# Patient Record
Sex: Female | Born: 1955 | Race: White | Hispanic: No | Smoking: Never smoker
Health system: Southern US, Community
[De-identification: ages and names within clinical notes are randomized; demographics above are authoritative.]

## PROBLEM LIST (undated history)

## (undated) DIAGNOSIS — L309 Dermatitis, unspecified: Secondary | ICD-10-CM

## (undated) DIAGNOSIS — N3281 Overactive bladder: Secondary | ICD-10-CM

## (undated) DIAGNOSIS — Z85850 Personal history of malignant neoplasm of thyroid: Secondary | ICD-10-CM

## (undated) DIAGNOSIS — M069 Rheumatoid arthritis, unspecified: Secondary | ICD-10-CM

## (undated) DIAGNOSIS — M189 Osteoarthritis of first carpometacarpal joint, unspecified: Secondary | ICD-10-CM

## (undated) DIAGNOSIS — T7840XA Allergy, unspecified, initial encounter: Secondary | ICD-10-CM

## (undated) DIAGNOSIS — G43909 Migraine, unspecified, not intractable, without status migrainosus: Secondary | ICD-10-CM

## (undated) DIAGNOSIS — F329 Major depressive disorder, single episode, unspecified: Secondary | ICD-10-CM

## (undated) DIAGNOSIS — E039 Hypothyroidism, unspecified: Secondary | ICD-10-CM

## (undated) DIAGNOSIS — T4145XA Adverse effect of unspecified anesthetic, initial encounter: Secondary | ICD-10-CM

## (undated) DIAGNOSIS — F32A Depression, unspecified: Secondary | ICD-10-CM

## (undated) DIAGNOSIS — G8929 Other chronic pain: Secondary | ICD-10-CM

## (undated) DIAGNOSIS — G4730 Sleep apnea, unspecified: Secondary | ICD-10-CM

## (undated) DIAGNOSIS — T8859XA Other complications of anesthesia, initial encounter: Secondary | ICD-10-CM

## (undated) DIAGNOSIS — M545 Low back pain, unspecified: Secondary | ICD-10-CM

## (undated) DIAGNOSIS — M1990 Unspecified osteoarthritis, unspecified site: Secondary | ICD-10-CM

## (undated) DIAGNOSIS — K219 Gastro-esophageal reflux disease without esophagitis: Secondary | ICD-10-CM

## (undated) DIAGNOSIS — K5190 Ulcerative colitis, unspecified, without complications: Secondary | ICD-10-CM

## (undated) HISTORY — PX: DILATION AND CURETTAGE OF UTERUS: SHX78

## (undated) HISTORY — DX: Ulcerative colitis, unspecified, without complications: K51.90

## (undated) HISTORY — DX: Rheumatoid arthritis, unspecified: M06.9

## (undated) HISTORY — PX: THYROIDECTOMY, PARTIAL: SHX18

## (undated) HISTORY — PX: BUNIONECTOMY: SHX129

## (undated) HISTORY — PX: OTHER SURGICAL HISTORY: SHX169

## (undated) HISTORY — DX: Allergy, unspecified, initial encounter: T78.40XA

---
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
## 1999-04-22 ENCOUNTER — Encounter: Payer: Self-pay | Admitting: *Deleted

## 1999-04-22 ENCOUNTER — Encounter: Admission: RE | Admit: 1999-04-22 | Discharge: 1999-04-22 | Payer: Self-pay | Admitting: *Deleted

## 2000-09-22 ENCOUNTER — Encounter: Payer: Self-pay | Admitting: Allergy and Immunology

## 2000-09-22 ENCOUNTER — Encounter: Admission: RE | Admit: 2000-09-22 | Discharge: 2000-09-22 | Payer: Self-pay | Admitting: *Deleted

## 2000-10-09 ENCOUNTER — Other Ambulatory Visit: Admission: RE | Admit: 2000-10-09 | Discharge: 2000-10-09 | Payer: Self-pay | Admitting: *Deleted

## 2000-11-29 ENCOUNTER — Ambulatory Visit (HOSPITAL_COMMUNITY): Admission: RE | Admit: 2000-11-29 | Discharge: 2000-12-02 | Payer: Self-pay | Admitting: *Deleted

## 2000-11-29 ENCOUNTER — Encounter (INDEPENDENT_AMBULATORY_CARE_PROVIDER_SITE_OTHER): Payer: Self-pay | Admitting: *Deleted

## 2000-11-29 HISTORY — PX: THYROIDECTOMY, PARTIAL: SHX18

## 2001-05-02 ENCOUNTER — Encounter: Payer: Self-pay | Admitting: Endocrinology

## 2001-05-02 ENCOUNTER — Ambulatory Visit (HOSPITAL_COMMUNITY): Admission: RE | Admit: 2001-05-02 | Discharge: 2001-05-02 | Payer: Self-pay | Admitting: Endocrinology

## 2001-07-13 ENCOUNTER — Ambulatory Visit (HOSPITAL_BASED_OUTPATIENT_CLINIC_OR_DEPARTMENT_OTHER): Admission: RE | Admit: 2001-07-13 | Discharge: 2001-07-13 | Payer: Self-pay | Admitting: Orthopedic Surgery

## 2001-07-13 HISTORY — PX: CARPAL TUNNEL RELEASE: SHX101

## 2001-08-10 ENCOUNTER — Ambulatory Visit (HOSPITAL_BASED_OUTPATIENT_CLINIC_OR_DEPARTMENT_OTHER): Admission: RE | Admit: 2001-08-10 | Discharge: 2001-08-10 | Payer: Self-pay | Admitting: Orthopedic Surgery

## 2001-08-10 HISTORY — PX: CARPAL TUNNEL RELEASE: SHX101

## 2001-12-18 ENCOUNTER — Ambulatory Visit (HOSPITAL_COMMUNITY): Admission: RE | Admit: 2001-12-18 | Discharge: 2001-12-18 | Payer: Self-pay | Admitting: Endocrinology

## 2001-12-21 ENCOUNTER — Encounter: Payer: Self-pay | Admitting: Endocrinology

## 2001-12-21 ENCOUNTER — Ambulatory Visit (HOSPITAL_COMMUNITY): Admission: RE | Admit: 2001-12-21 | Discharge: 2001-12-21 | Payer: Self-pay | Admitting: Endocrinology

## 2002-01-26 ENCOUNTER — Ambulatory Visit (HOSPITAL_COMMUNITY): Admission: RE | Admit: 2002-01-26 | Discharge: 2002-01-26 | Payer: Self-pay | Admitting: *Deleted

## 2002-01-31 ENCOUNTER — Encounter: Admission: RE | Admit: 2002-01-31 | Discharge: 2002-01-31 | Payer: Self-pay | Admitting: *Deleted

## 2002-02-06 ENCOUNTER — Encounter (INDEPENDENT_AMBULATORY_CARE_PROVIDER_SITE_OTHER): Payer: Self-pay | Admitting: *Deleted

## 2002-02-06 ENCOUNTER — Ambulatory Visit (HOSPITAL_COMMUNITY): Admission: RE | Admit: 2002-02-06 | Discharge: 2002-02-06 | Payer: Self-pay | Admitting: Gastroenterology

## 2002-12-04 ENCOUNTER — Other Ambulatory Visit: Admission: RE | Admit: 2002-12-04 | Discharge: 2002-12-04 | Payer: Self-pay | Admitting: Obstetrics and Gynecology

## 2003-12-30 ENCOUNTER — Other Ambulatory Visit: Admission: RE | Admit: 2003-12-30 | Discharge: 2003-12-30 | Payer: Self-pay | Admitting: Obstetrics and Gynecology

## 2004-01-09 ENCOUNTER — Encounter: Admission: RE | Admit: 2004-01-09 | Discharge: 2004-01-09 | Payer: Self-pay | Admitting: Obstetrics and Gynecology

## 2004-02-11 ENCOUNTER — Ambulatory Visit (HOSPITAL_COMMUNITY): Admission: RE | Admit: 2004-02-11 | Discharge: 2004-02-11 | Payer: Self-pay | Admitting: Obstetrics and Gynecology

## 2004-03-01 ENCOUNTER — Encounter (INDEPENDENT_AMBULATORY_CARE_PROVIDER_SITE_OTHER): Payer: Self-pay | Admitting: Specialist

## 2004-03-01 ENCOUNTER — Ambulatory Visit (HOSPITAL_COMMUNITY): Admission: RE | Admit: 2004-03-01 | Discharge: 2004-03-01 | Payer: Self-pay | Admitting: Obstetrics and Gynecology

## 2004-03-01 HISTORY — PX: HYSTEROSCOPY WITH D & C: SHX1775

## 2004-12-21 ENCOUNTER — Encounter: Admission: RE | Admit: 2004-12-21 | Discharge: 2004-12-21 | Payer: Self-pay | Admitting: Internal Medicine

## 2005-01-07 ENCOUNTER — Other Ambulatory Visit: Admission: RE | Admit: 2005-01-07 | Discharge: 2005-01-07 | Payer: Self-pay | Admitting: Obstetrics and Gynecology

## 2005-02-01 ENCOUNTER — Ambulatory Visit (HOSPITAL_COMMUNITY): Admission: RE | Admit: 2005-02-01 | Discharge: 2005-02-01 | Payer: Self-pay | Admitting: Obstetrics and Gynecology

## 2005-12-16 ENCOUNTER — Ambulatory Visit (HOSPITAL_BASED_OUTPATIENT_CLINIC_OR_DEPARTMENT_OTHER): Admission: RE | Admit: 2005-12-16 | Discharge: 2005-12-16 | Payer: Self-pay | Admitting: Orthopedic Surgery

## 2005-12-16 HISTORY — PX: BUNIONECTOMY WITH CHILECTOMY: SHX5598

## 2006-01-18 ENCOUNTER — Other Ambulatory Visit: Admission: RE | Admit: 2006-01-18 | Discharge: 2006-01-18 | Payer: Self-pay | Admitting: Obstetrics and Gynecology

## 2006-02-06 ENCOUNTER — Ambulatory Visit (HOSPITAL_COMMUNITY): Admission: RE | Admit: 2006-02-06 | Discharge: 2006-02-06 | Payer: Self-pay | Admitting: Obstetrics and Gynecology

## 2006-10-04 ENCOUNTER — Ambulatory Visit (HOSPITAL_COMMUNITY): Admission: RE | Admit: 2006-10-04 | Discharge: 2006-10-04 | Payer: Self-pay | Admitting: Gastroenterology

## 2006-11-03 ENCOUNTER — Encounter (INDEPENDENT_AMBULATORY_CARE_PROVIDER_SITE_OTHER): Payer: Self-pay | Admitting: General Surgery

## 2006-11-03 ENCOUNTER — Ambulatory Visit (HOSPITAL_COMMUNITY): Admission: RE | Admit: 2006-11-03 | Discharge: 2006-11-03 | Payer: Self-pay | Admitting: General Surgery

## 2006-11-03 HISTORY — PX: LAPAROSCOPIC CHOLECYSTECTOMY: SUR755

## 2006-11-23 ENCOUNTER — Encounter: Admission: RE | Admit: 2006-11-23 | Discharge: 2006-11-23 | Payer: Self-pay | Admitting: Internal Medicine

## 2007-02-12 ENCOUNTER — Encounter (HOSPITAL_COMMUNITY): Admission: RE | Admit: 2007-02-12 | Discharge: 2007-02-16 | Payer: Self-pay | Admitting: Endocrinology

## 2007-07-13 ENCOUNTER — Ambulatory Visit (HOSPITAL_COMMUNITY): Admission: RE | Admit: 2007-07-13 | Discharge: 2007-07-13 | Payer: Self-pay | Admitting: Neurology

## 2008-01-30 ENCOUNTER — Observation Stay (HOSPITAL_COMMUNITY): Admission: EM | Admit: 2008-01-30 | Discharge: 2008-01-31 | Payer: Self-pay | Admitting: Emergency Medicine

## 2008-01-30 ENCOUNTER — Ambulatory Visit: Payer: Self-pay | Admitting: Cardiology

## 2008-01-31 ENCOUNTER — Encounter (INDEPENDENT_AMBULATORY_CARE_PROVIDER_SITE_OTHER): Payer: Self-pay | Admitting: Internal Medicine

## 2008-02-11 ENCOUNTER — Ambulatory Visit: Payer: Self-pay

## 2008-02-15 ENCOUNTER — Ambulatory Visit (HOSPITAL_COMMUNITY): Admission: RE | Admit: 2008-02-15 | Discharge: 2008-02-15 | Payer: Self-pay | Admitting: Gastroenterology

## 2008-02-21 ENCOUNTER — Ambulatory Visit: Payer: Self-pay | Admitting: Cardiology

## 2008-03-13 ENCOUNTER — Ambulatory Visit (HOSPITAL_BASED_OUTPATIENT_CLINIC_OR_DEPARTMENT_OTHER): Admission: RE | Admit: 2008-03-13 | Discharge: 2008-03-13 | Payer: Self-pay | Admitting: General Surgery

## 2008-03-13 HISTORY — PX: ABDOMINAL HERNIA REPAIR: SHX539

## 2009-04-21 ENCOUNTER — Ambulatory Visit (HOSPITAL_COMMUNITY): Admission: RE | Admit: 2009-04-21 | Discharge: 2009-04-21 | Payer: Self-pay | Admitting: Rheumatology

## 2009-04-27 ENCOUNTER — Ambulatory Visit (HOSPITAL_COMMUNITY): Admission: RE | Admit: 2009-04-27 | Discharge: 2009-04-27 | Payer: Self-pay | Admitting: Rheumatology

## 2009-06-17 IMAGING — CT CT PELVIS W/ CM
2 of 5 series · 14 of 32 positions shown, 19 images · IV contrast (READICAT & 100 ML OMNI 300)
Comparison: Although no images are available, the report for an
abdomen and pelvis CT of 01/31/2002 has been reviewed.

CT ABDOMEN

CLINICAL DATA: Abdominal pain and nausea.  Intermittent and mostly
in the right lower quadrant.  History of thyroid cancer.  History
of cholecystectomy.

CT ABDOMEN AND PELVIS WITH CONTRAST
TECHNIQUE: Multidetector CT imaging of the abdomen and pelvis was
performed using the standard protocol following bolus
administration of intravenous contrast.
Contrast: 100 ml [id number].

[Series 2: routine abdomen · axial · 0.77mm/px · z∈[-502,-142]mm · 7 of 98 slices shown, 12 images]
[im 13/98  soft-tissue]
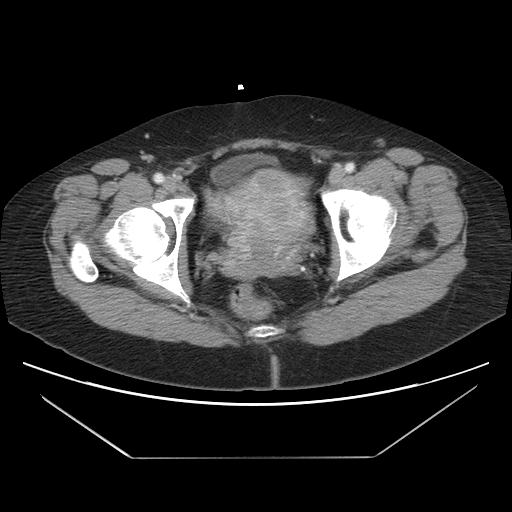
[im 13/98  bone]
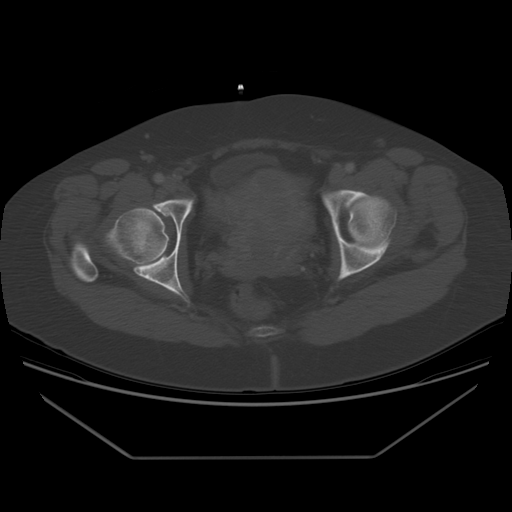
[im 25/98  soft-tissue]
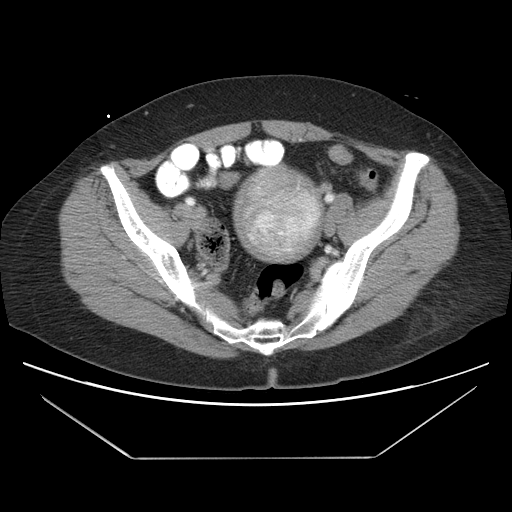
[im 37/98  soft-tissue]
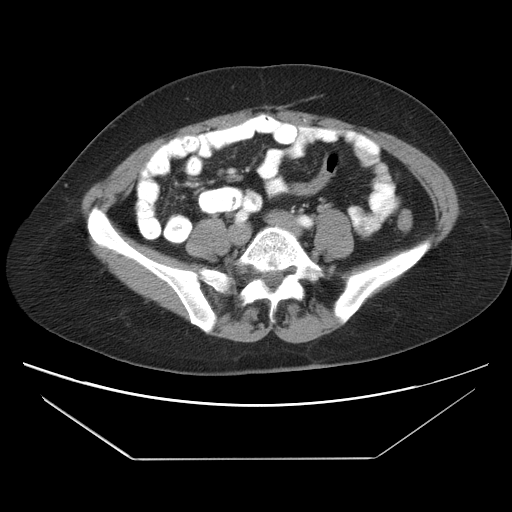
[im 49/98  soft-tissue]
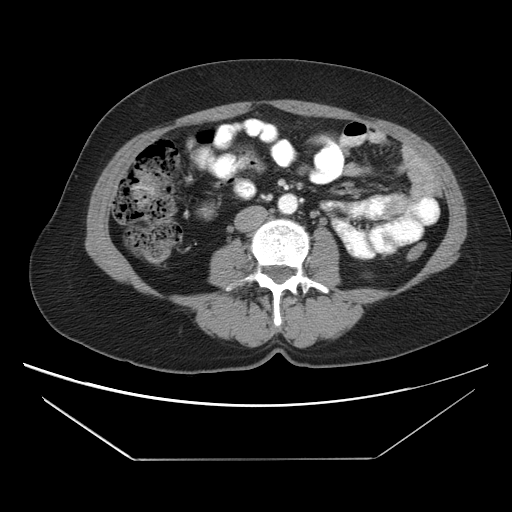
[im 49/98  lung]
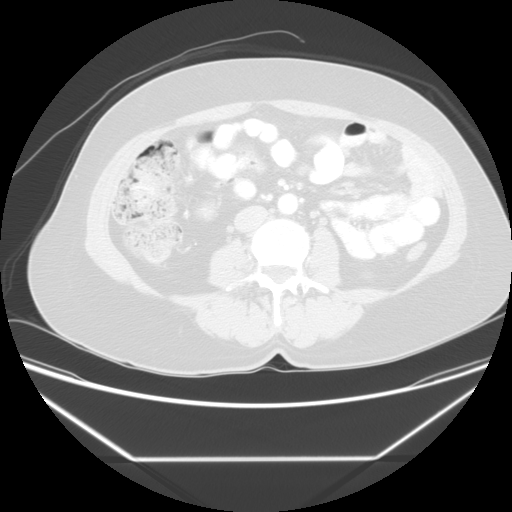
[im 61/98  soft-tissue]
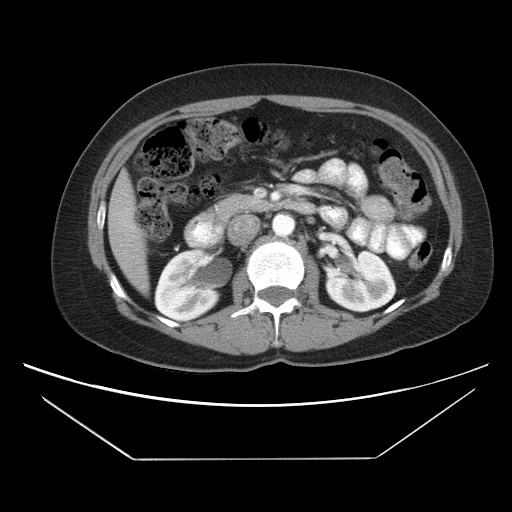
[im 61/98  lung]
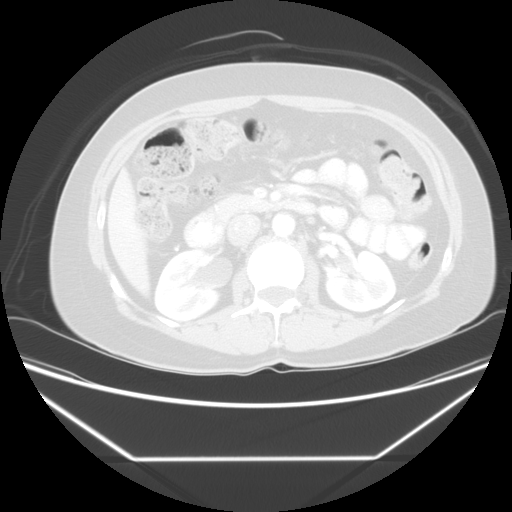
[im 73/98  soft-tissue]
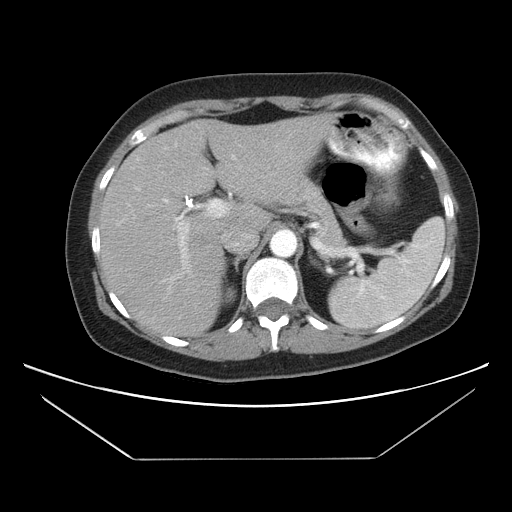
[im 73/98  lung]
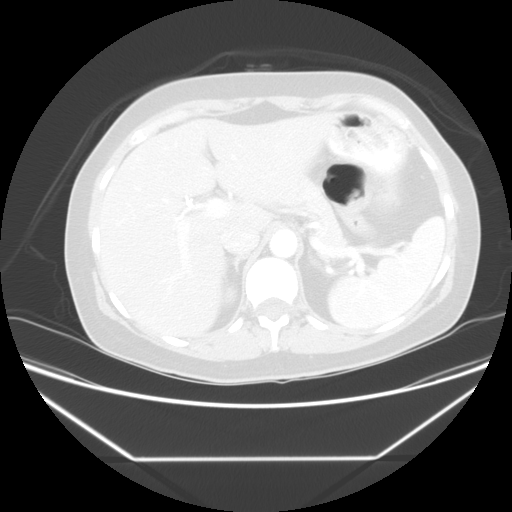
[im 85/98  soft-tissue]
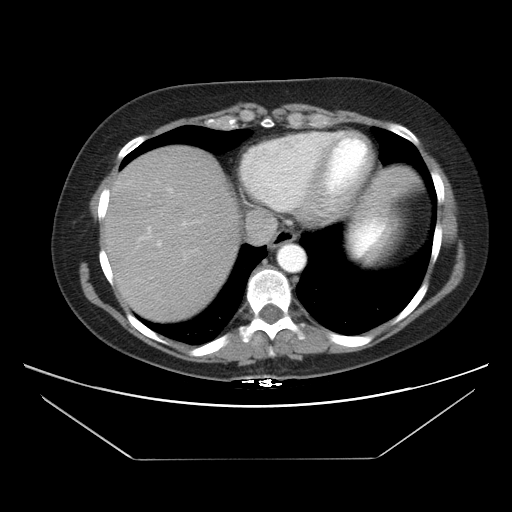
[im 85/98  lung]
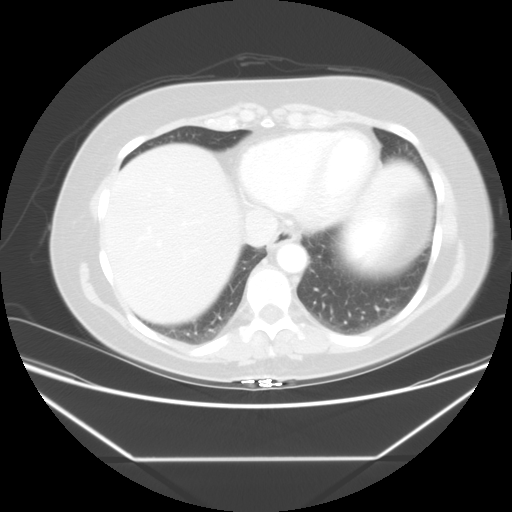

[Series 400: reformatted · sagittal · 0.98mm/px · 7 of 112 slices shown]
[im 13/112  soft-tissue]
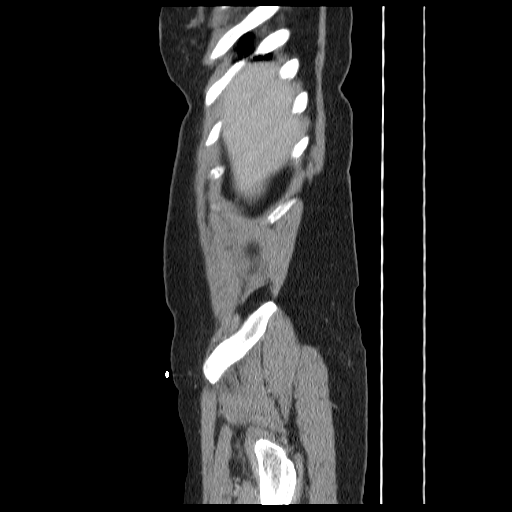
[im 25/112  soft-tissue]
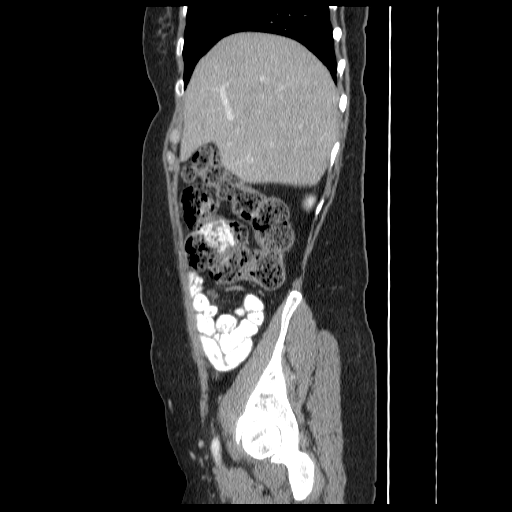
[im 38/112  soft-tissue]
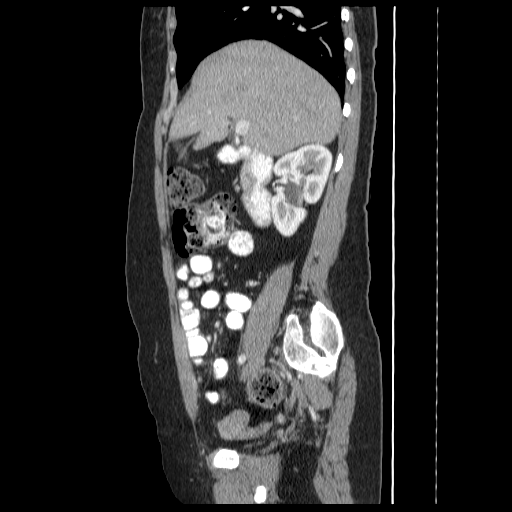
[im 50/112  soft-tissue]
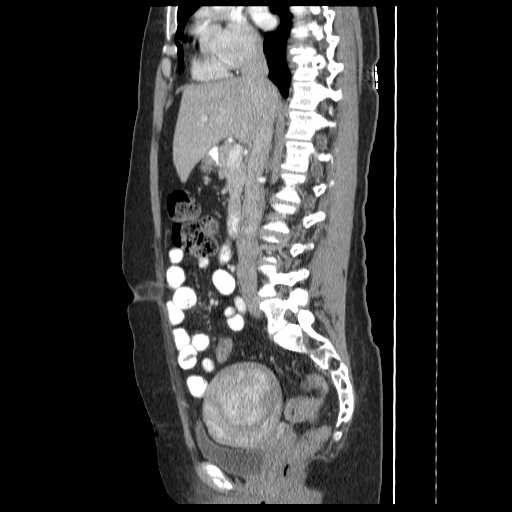
[im 62/112  soft-tissue]
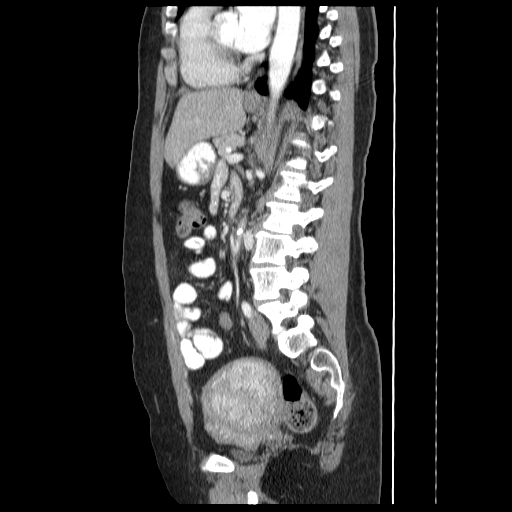
[im 75/112  soft-tissue]
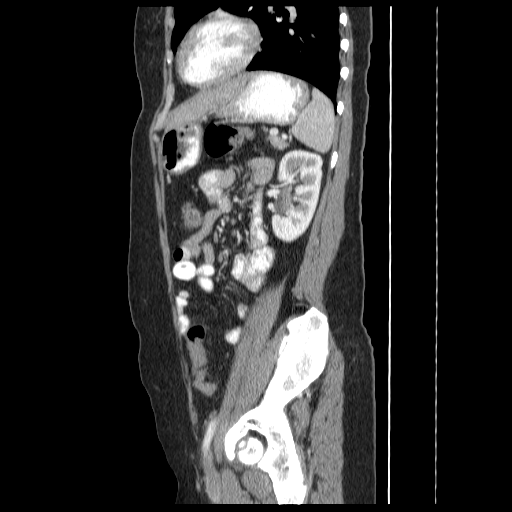
[im 87/112  soft-tissue]
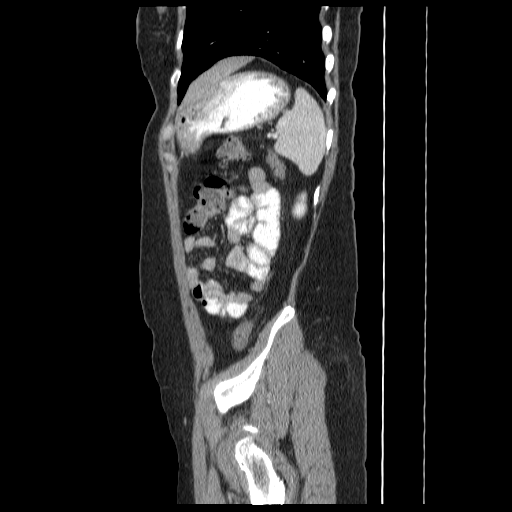

[14 of 32 positions shown; findings below may reference images not displayed]

FINDINGS: No focal abnormalities seen in the liver or spleen.  The
stomach, duodenum, pancreas, adrenal glands, and kidneys have
normal imaging features.

No intraperitoneal free fluid.  No abdominal lymphadenopathy.
There is no abdominal aortic aneurysm.  Circumaortic left renal
vein noted.  Portal vein, superior mesenteric vein, and splenic
vein are patent.  Celiac axis and superior mesenteric artery are
widely patent as is the inferior mesenteric artery.

Abdominal bowel loops have normal imaging features.  Small
umbilical hernia contains only omental fat.
IMPRESSION: No CT evidence to explain this patient's history of abdominal pain
and nausea.

CT PELVIS
FINDINGS: No pelvic lymphadenopathy.  7.4 cm posterior intramural
fibroid seen in the uterus.  There is no adnexal mass.  No
intraperitoneal free fluid.  Bladder is unremarkable.

Cecal tip is flipped up under the hepatic flexure, but the cecum
remains posterior and medial to the ascending colon. There is no
cecal volvulus. No large or small bowel wall thickening.  The
terminal ileum is normal.  The appendix is normal.

Bone windows show no worrisome lytic or sclerotic osseous lesions.
IMPRESSION: No acute findings in the anatomic pelvis.  No radiographic evidence
to explain this patient's history of abdominal pain and nausea.

## 2010-06-13 ENCOUNTER — Encounter: Payer: Self-pay | Admitting: Internal Medicine

## 2010-08-13 ENCOUNTER — Other Ambulatory Visit (HOSPITAL_COMMUNITY): Payer: Self-pay | Admitting: Internal Medicine

## 2010-08-13 DIAGNOSIS — Z1231 Encounter for screening mammogram for malignant neoplasm of breast: Secondary | ICD-10-CM

## 2010-08-24 ENCOUNTER — Ambulatory Visit (HOSPITAL_COMMUNITY): Payer: BC Managed Care – PPO

## 2010-08-26 ENCOUNTER — Ambulatory Visit (HOSPITAL_COMMUNITY)
Admission: RE | Admit: 2010-08-26 | Discharge: 2010-08-26 | Disposition: A | Discharge status: Home / Self Care | Payer: BC Managed Care – PPO | Source: Ambulatory Visit | Attending: Internal Medicine | Admitting: Internal Medicine

## 2010-08-26 DIAGNOSIS — Z1231 Encounter for screening mammogram for malignant neoplasm of breast: Secondary | ICD-10-CM

## 2010-08-31 ENCOUNTER — Other Ambulatory Visit: Payer: Self-pay | Admitting: Internal Medicine

## 2010-08-31 DIAGNOSIS — R928 Other abnormal and inconclusive findings on diagnostic imaging of breast: Secondary | ICD-10-CM

## 2010-09-08 ENCOUNTER — Other Ambulatory Visit: Payer: BC Managed Care – PPO

## 2010-09-09 ENCOUNTER — Other Ambulatory Visit: Payer: Self-pay | Admitting: Internal Medicine

## 2010-09-09 ENCOUNTER — Other Ambulatory Visit: Payer: Self-pay | Admitting: Diagnostic Radiology

## 2010-09-09 ENCOUNTER — Ambulatory Visit
Admission: RE | Admit: 2010-09-09 | Discharge: 2010-09-09 | Disposition: A | Discharge status: Home / Self Care | Payer: BC Managed Care – PPO | Source: Ambulatory Visit | Attending: Internal Medicine | Admitting: Internal Medicine

## 2010-09-09 DIAGNOSIS — R928 Other abnormal and inconclusive findings on diagnostic imaging of breast: Secondary | ICD-10-CM

## 2010-09-14 ENCOUNTER — Other Ambulatory Visit: Payer: Self-pay | Admitting: Gastroenterology

## 2010-09-14 DIAGNOSIS — R1032 Left lower quadrant pain: Secondary | ICD-10-CM

## 2010-09-16 ENCOUNTER — Ambulatory Visit
Admission: RE | Admit: 2010-09-16 | Discharge: 2010-09-16 | Disposition: A | Discharge status: Home / Self Care | Payer: BC Managed Care – PPO | Source: Ambulatory Visit | Attending: Gastroenterology | Admitting: Gastroenterology

## 2010-09-16 DIAGNOSIS — R1032 Left lower quadrant pain: Secondary | ICD-10-CM

## 2010-09-16 MED ORDER — IOHEXOL 300 MG/ML  SOLN
100.0000 mL | Freq: Once | INTRAMUSCULAR | Status: AC | PRN
  Administered 2010-09-16: 100 mL via INTRAVENOUS

## 2010-10-05 NOTE — Discharge Summary (Signed)
NAME:  Terry Shannon, Terry Shannon NO.:  [id number]   MEDICAL RECORD NO.:  [id number]          PATIENT TYPE:  OBV   LOCATION:  6533                         FACILITY:  [facility]   PHYSICIAN:  Richarda Overlie, MD       DATE OF BIRTH:  [birth year]   DATE OF ADMISSION:  01/30/2008  DATE OF DISCHARGE:  01/31/2008                               DISCHARGE SUMMARY   DISCHARGE DIAGNOSES:  1. Chest pain, ruled out for acute coronary syndrome.  2. Negative D-dimer, low suspicion for pulmonary embolism.  3. History of hypothyroidism.  4. History of ulcerative colitis.  5. History of possible rheumatoid arthritis.   SUBJECTIVE:  This is a 55 year old female who presents to the ER with a  chief complaint of stuttering chest pain, onset about 24 hours prior to  her presentation, 5/10 in intensity, not particularly related to  exertion.  It woke the patient up from her sleep.  The chest pain has  been intermittent over the last 24 hours.  The patient was found to have  ventricular trigeminy at urgent care and was referred to [facility] ER  for further evaluation.  Initially, the patient was found to be mildly  hypertensive, with systolic blood pressure 149/82.  Initial EKG showed  ventricular trigeminy.  Serial troponins were found to be negative.  Serial CK-MBs were found to be negative.  A 2D echocardiogram was done  that showed normal ejection fraction of 55%.  No gross valvular heart  disease.  No wall motion abnormalities.  The patient was found to have a  low TSH of 0.302, with a normal free T4.  Since the patient is  symptomatic with ectopy, the dose of her Synthroid is being reduced to  100 mcg p.o. daily.  The patient was also started on beta blocker 25 mg  p.o. daily, which improved her ectopy, ruled out for acute coronary  syndrome, negative D-dimer.  Cardiology consultation was obtained, and  the patient was recommended to have an outpatient Myoview exercise  stress test at the [facility] on February 06, 2008 at  12:00.   DISCHARGE MEDICATIONS:  1. Synthroid 100 mcg p.o. daily.  2. Metoprolol 25 mg p.o. twice a day.  3. Ambien 12.5 at bedtime.  4. Imitrex 100 mg p.r.n.  5. Mesalamine 1 tablet b.i.d.  6. Nexium 40 mg daily.  7. Oxybutynin 10 mg daily.  8. Zyrtec 10 mg daily.   MEDICATIONS TO BE DISCONTINUED:  Prempro.   FOLLOW-UP CONCERNS:  1. Patient to follow up with her primary care Ivyana Locey in 5-7 days.  2. Follow up for the stress test on February 06, 2008, with results      to PCP as well as cardiology.      Richarda Overlie, MD  Electronically Signed     NA/MEDQ  D:  01/31/2008  T:  01/31/2008  Job:  [id number]

## 2010-10-05 NOTE — Consult Note (Signed)
NAME:  Terry Shannon, Terry Shannon NO.:  [id number]   MEDICAL RECORD NO.:  [id number]          PATIENT TYPE:  OBV   LOCATION:  6533                         FACILITY:  [facility]   PHYSICIAN:  Madolyn Frieze. Jens Som, MD, FACCDATE OF BIRTH:  May 27, 1955   DATE OF CONSULTATION:  01/30/2008  DATE OF DISCHARGE:  01/31/2008                                 CONSULTATION   PRIMARY CARDIOLOGIST:  New to [facility] Cardiology being seen by Dr. Madolyn Frieze. Crenshaw.   PRIMARY CARE Teddy Rebstock:  Merlene Laughter. Renae Gloss, MD   PATIENT PROFILE:  A 55 year old Caucasian female without prior cardiac  history who presented with chest pain.   PROBLEMS:  1. Chest pain.  2. Ventricular trigeminy.      a.     January 31, 2008, 2-D echocardiogram EF 65%, no wall       motion abnormalities.  3. Rheumatoid arthritis.  4. Thyroid cancer.      a.     Status post thyroidectomy in 2002.  5. Ulcerative colitis.  6. Migraine headache.  7. Status post laparoscopic cholecystectomy in 2005.  8. History of abnormal uterine bleeding and endometrial polyps,      October 2005.  9. Carpal tunnel syndrome, status post release on the right, February      2003, and release on the left in March 2003.  10.Umbilical hernia pending for surgery, October 2009.   HISTORY OF PRESENT ILLNESS:  A 55 year old Caucasian female without  prior cardiac history.  She was in her usual state of health until the  night before last when she awoke with sharp focal knife-like stabbing  pain at her left lower sternal border beneath the left breast without  associated symptoms.  She sat up and pain resolved within 1-2 minutes.  She had 4-5 recurring episodes of chest pain throughout the night, each  relieved in 1-2 minutes with sitting up.  During the day of January 30, 2008, she had multiple recurrent episodes while at work, generally while  sitting or may be while standing, again sharp and knife-like lasting 5-  10 minutes and resolving  spontaneously.  She had no associated symptoms.  Because she had multiple episodes, she presented to an Urgent Care last  night and ECG was performed showing ventricular trigeminy, otherwise,  sinus rhythm without acute ST or T changes.  She was then taken to the  [facility] ED.  She was admitted by the [facility] and cardiac  markers and D-dimer have been negative.  She was placed on beta-blocker  therapy and now has occasional PVCs with resolution of trigeminy.  She  has had some intermittent pain since she has been hospitalized, although  is currently pain free.   ALLERGIES:  CODEINE and IV CONTRAST.   CURRENT MEDICATIONS:  1. Aspirin 81 mg daily.  2. Enoxaparin 40 mg daily.  3. Lidocaine patch daily.  4. Mesalamine 800 mg b.i.d.  5. Lopressor 25 mg b.i.d.  6. Protonix 40 mg daily.   FAMILY HISTORY:  Mother died of CVA at 52, father died of lung cancer  with a  history of diabetes at 72.  She has 3 brothers, 1 has borderline  diabetes, otherwise, all are alive and well.   SOCIAL HISTORY:  She lives in [place] with her partner.  She works as  a Theatre manager.  She denies tobacco or drug use.  She has 1-2  alcoholic beverages a week.  She walks on a treadmill at a rapid pace 3  times per week without limitations.   REVIEW OF SYSTEMS:  Positive for chest pain.  She has history of  umbilical hernia and has had some middle abdominal tenderness.  She has  rheumatoid arthritis with chronic foot pain.  She is premenopausal and  thus occasionally has hot flashes.  Otherwise, all systems reviewed are  negative.   PHYSICAL EXAMINATION:  VITAL SIGNS:  Temperature 98.0, heart rate 76,  respirations 20, blood pressure 150/53, pulse ox 96% on room air, and  weight is 96.7 kg.  GENERAL:  Pleasant, white female in no acute distress.  Awake, alert and  oriented x3.  HEENT:  Normal.  NEUROLOGIC:  Grossly intact.  Nonfocal.  SKIN:  Warm and dry without lesions or masses.   NECK:  No bruits or JVD.  LUNGS:  Respirations are regular and unlabored, clear to auscultation.  CARDIAC:  Regular S1 and S2.  No S3, S4, or murmurs.  ABDOMEN:  Round, soft with mild midline abdominal tenderness at the  location of her umbilical hernia.  Bowel sounds present x4.  EXTREMITIES:  Warm and dry.  No clubbing, cyanosis, or edema.  Dorsalis  pedis posterior tibial pulses 2+ bilaterally.   Chest x-ray shows no evidence of acute cardiopulmonary disease.  Echocardiogram shows an EF of 55% without regional wall motion  abnormalities.  EKG shows sinus rhythm at rate of 71 with no acute ST or  T changes.  Hemoglobin 14.3, hematocrit 41.9, WBCs 6.7, and platelets  251.  Sodium 142, potassium 4.3, chloride 106, CO2 27, BUN 12,  creatinine 0.74, and glucose 100.  Total bilirubin 0.7, alkaline  phosphatase 56, AST 24, ALT 28, total protein 6.5, and albumin 4.0.  TSH  0.279.  D-dimer 0.31.  Cardiac markers negative x3.  Urinalysis  negative.   ASSESSMENT AND PLAN:  1. Chest pain.  The patient presents with atypical sharp, shooting and      focal chest pain.  Cardiac markers are negative.  D-dimers are      normal.  She initially presented with ventricular trigeminy which      has improved with beta-blocker therapy.  We have arranged for an      outpatient exercise Myoview on February 06, 2008, at 12 noon.  We      would also set up to see Dr. Jens Som in followup on February 21, 2008, at 10:45 a.m.  We would plan no additional inpatient cardiac      evaluation at this time.  Notably echocardiogram reviewed and      normal.  2. ?Hypertension.  The patient without previous history of diagnosed      hypertension, although her pressure has been elevated while she is      here.  This morning her pressure was lower with a systolic of 115.      She is currently on Lopressor 25 mg b.i.d. which was initiated for      premature ventricular contractions and we will recommend       continuation of this.  3. Ventricular trigeminy, better  with beta-blocker therapy.      Electrolytes are within normal limits.  We will obtain a Myoview to      rule out ischemia.  4. History of hypothyroidism.  TSH is low.  The patient is on      Synthroid and this is currently being held.  The      Internal Medicine Team has ordered a free T4 to further evaluate.      The patient to follow up with Dr. Renae Gloss.  5. Umbilical hernia.  The patient is pending surgery in October 2009.      We will obtain a Myoview which hopefully will service cardiac      clearance.      Nicolasa Ducking, ANP      Madolyn Frieze. Jens Som, MD, [facility]  Electronically Signed    CB/MEDQ  D:  01/31/2008  T:  02/01/2008  Job:  [id number]

## 2010-10-05 NOTE — Op Note (Signed)
NAME:  RANDY, WHITENER NO.:  [id number]   MEDICAL RECORD NO.:  [id number]          PATIENT TYPE:  AMB   LOCATION:  DSC                          FACILITY:  [facility]   PHYSICIAN:  Cherylynn Ridges, M.D.    DATE OF BIRTH:  1955/09/17   DATE OF PROCEDURE:  03/13/2008  DATE OF DISCHARGE:                               OPERATIVE REPORT   PREOPERATIVE DIAGNOSIS:  Periumbilical incisional hernia.   POSTOPERATIVE DIAGNOSES:  1. Periumbilical incisional hernia.  2. A 2 cm hernia defect.   PROCEDURE:  Primary repair of periumbilical ventral hernia/incisional  hernia without mesh.   SURGEON:  Marta Lamas. Lindie Spruce, MD   ANESTHESIA:  General with a laryngeal airway.   ESTIMATED BLOOD LOSS:  Less than 20 mL.   COMPLICATIONS:  None.   CONDITION:  Stable.   INDICATIONS FOR PROCEDURE:  The patient is a 55 year old who is status  post laparoscopic cholecystectomy and this developed a hernia and a  periumbilical incision who now comes in for repair.   OPERATION:  The patient was taken to the operating room and placed on  table in a supine position.  After an adequate general laryngeal airway  anesthetic was administered, she was prepped and draped in a usual  sterile manner exposing the periumbilical area.   A supraumbilical curvilinear incision was made using #15 blade through  the previous site of her laparoscopic incision.  It was taken down to  the midline fascial edges of the hernia sac.  The hernia sac was  dissected away from the umbilical skin and as we encircled the sac we  cut down at the fascial edge of the sac and the fascia incising into the  peritoneal cavity.  We grabbed the edges of the fascia with Kocher  clamps taking care not to injure the bowel.  We subsequently repaired  the defect using interrupted simple and figure-of-eight stitches of #1  Novafil.  We then used a running back and forth stitch of 0 Prolene to  reinforce the interrupted repair.  No mesh was  used.  We  irrigated with saline solution.  We then closed in 2 layers.  The subcu  was closed with 3-0 Vicryl, then the skin was closed using a running  subcuticular stitch of 4-0 Monocryl.  A 0.5% Marcaine without epi was  injected into the skin.  A total of 9 mL used.  Sterile dressing was  applied including Dermabond, Steri-Strips, and Tegaderm.      Cherylynn Ridges, M.D.  Electronically Signed     JOW/MEDQ  D:  03/13/2008  T:  03/14/2008  Job:  [id number]

## 2010-10-05 NOTE — Assessment & Plan Note (Signed)
[facility]                            CARDIOLOGY OFFICE NOTE   Terry, Shannon                      MRN:          [id number]  DATE:02/21/2008                            DOB:          1956-01-25    [name] is a very pleasant 55 year old female that I recently saw in  the hospital on January 30, 2008, secondary to atypical chest pain.  She was also noted to have occasional PVCs.  She did rule out for  myocardial infarction with serial enzymes.  She also had a D-dimer that  was normal.  We schedule her to have an outpatient Myoview which was  formed on February 11, 2008.  Her perfusion was normal.  The study was  not gated due to her ectopy.  Also note, she had an echocardiogram at  that time she was in the hospital performed on January 31, 2008.  Her  LV function was normal.  There was no significant valvular abnormalities  noted.  Since that time, she has continued to have chest pain.  It is  under the left breast at times but also above the left breast at times.  It is not exertional.  It is not positional.  It typically lasts 5-10  minutes and resolves spontaneously.  It does not radiate.  There is no  associated shortness of breath, nausea, vomiting, or diaphoresis.  She  does occasionally note this worse after eating and improves somewhat  with sitting up.  She also thinks that taking a deep breath sometimes  makes it worse.   MEDICATIONS:  1. Metoprolol 25 mg p.o. b.i.d.  2. Flexeril 5 mg p.o. daily.  3. Lialda b.i.d.  4. Oxybutynin.  5. Alotec.  6. Synthroid 137 mcg p.o. daily.  7. Multivitamin.  8. Ambien.   PHYSICAL EXAMINATION:  VITAL SIGNS:  Today shows a blood pressure 115/80  and her pulse is 80.  She weighs 170 pounds.  HEENT:  Normal.  NECK:  Supple.  CHEST:  Clear.  CARDIOVASCULAR:  Regular rate and rhythm.  ABDOMEN:  No tenderness.  EXTREMITIES:  No edema.   DIAGNOSES:  1. Recent chest pain - her symptoms are  extremely atypical.  She ruled      out for myocardial infarction and her recent Myoview was normal.  I      do not think this is cardiac and we will not pursue further      evaluation.  It certainly may be gastrointestinal related.      Regardless, I have asked to follow Dr. Renae Shannon for further      evaluation.  She may need a gastrointestinal evaluation in the      future.  2. Recent premature ventricular contractions - she is not having      symptoms with this and her left ventricular function is normal.      She will continue on her Lopressor for now.  Some of this may be      related to her recent increased dose of Synthroid.  Now that it has      been  reducing, we may be able to discontinue her Lopressor down the      road as long as her blood pressure will tolerate.  I have asked her      to follow up with Dr. Renae Shannon concerning this issue as well.  3. Rheumatoid arthritis.  4. History of thyroid cancer.  5. Ulcerative colitis.  6. History of migraine headaches.  7. Hypothyroidism - she will follow up with Dr. Renae Shannon for further      management of this issue.  Apparently, her Synthroid would dose was      reduced in the hospital.   I will see her back on an as-needed basis.     Terry Shannon Terry Som, MD, [facility]  Electronically Signed    BSC/MedQ  DD: 02/21/2008  DT: 02/21/2008  Job #: [id number]   cc:   Terry Shannon. Terry Shannon, M.D.

## 2010-10-05 NOTE — Op Note (Signed)
NAME:  Terry Shannon, PAIR NO.:  [id number]   MEDICAL RECORD NO.:  [id number]          PATIENT TYPE:  AMB   LOCATION:  SDS                          FACILITY:  [facility]   PHYSICIAN:  Cherylynn Ridges, M.D.    DATE OF BIRTH:  04/26/1956   DATE OF PROCEDURE:  11/03/2006  DATE OF DISCHARGE:                               OPERATIVE REPORT   PREOPERATIVE DIAGNOSIS:  Symptomatic biliary dyskinesia.   POSTOPERATIVE DIAGNOSIS:  Symptomatic biliary dyskinesia.   PROCEDURE:  Laparoscopic cholecystectomy with cholangiogram.   SURGEON:  Cherylynn Ridges, M.D.   ASSISTANT:  Ollen Gross. Vernell Morgans, M.D.   ANESTHESIA:  General endotracheal.   SPECIMEN:  Gallbladder.   COMPLICATIONS:  None.   CONDITION:  Stable.   ESTIMATED BLOOD LOSS:  Less than 10 mL.   INDICATIONS FOR OPERATION:  The patient is a 55 year old with  symptomatic gallbladder dyskinesia who comes in now with an ejection  fraction of 11% for an elective laparoscopic cholecystectomy.   FINDINGS:  The patient had some adhesions of the duodenum to the  infundibulum of the gallbladder.  Cholangiogram was normal.   OPERATION:  The patient was taken to the operating room and placed on  the table in the supine position.  After an adequate endotracheal  anesthetic was administered, she was prepped and draped in the usual  sterile manner, exposing the midline and the right upper quadrant.   A supraumbilical curvilinear incision was made using #11 blade and taken  down to the midline fascia.  A defect was noted at the supraumbilical  margin, and we were able to bluntly dissect down through this umbilical  defect into the peritoneal cavity without having to make a cut with the  scalpel.  We grabbed the fascia with Kocher clamps and made a  pursestring suture around the fascial edge using 0 Vicryl suture.  A  Hassan cannula was passed through the fascial opening into the  peritoneal cavity and secured in place with the  pursestring.   Carbon dioxide insufflation was instilled through the Hassan cannula  into the peritoneal cavity up to a maximal pressure of 15 mmHg.  Two  right costal margin 5-mm cannulas and a subxiphoid 11/12-mm cannula were  passed under direct vision into the peritoneal cavity.  The patient was  placed in reversed Trendelenburg.  The left-side was tilted down and the  dissection begun.   Upon retracting the gallbladder towards the anterior abdominal wall and  right upper quadrant, adhesions were noted between the infundibulum and  the duodenum.  These were taken down with blunt and sharp dissection  with electrocautery, taking care to stay away from the duodenum.  We  were able to dissect out the cystic duct and the cystic artery in the  hepatoduodenal triangle and the triangle of Calot.  We isolated the  cystic duct and placed a clip along the gallbladder side.  We were able  to pass a Cook catheter through a cholecystodochotomy made with  laparoscopic scissors to perform the cholangiogram.  This showed good  flow into the duodenum, no intraductal  filling defects, no dilatation of  the common duct, and good proximal flow.   Once cholangiogram was completed, we removed the clip, securing the  catheter in place, triply clipped the distal cystic duct and transected  the cystic duct.   The cystic artery was easily identified and clipped proximally and  distally x3 and then transected.  We dissected out the gallbladder from  its bed with minimal difficulty without entrance into the gallbladder  dome itself.  We removed the gallbladder from the supraumbilical site  with minimal difficulty and then tied off the pursestring suture,  closing up the fascial opening.   We aspirated fluid and gas from around the gallbladder fossa and the  liver where there was minimal to no bleeding.  Only maybe 20 to 50 mL of  saline were used.  We aspirated all fluid and gas from above the liver  as we  removed all cannulas.   The skin sites at the subxiphoid and the supraumbilical site were closed  using running subcuticular stitch of 4-0 Vicryl after injecting 0.25%  Marcaine with epinephrine at all sites.  The lateral two trocar sites  were closed using Dermabond.  Sterile dressings were applied to all  wounds.  All needle counts, sponge counts, and instrument counts were  correct.      Cherylynn Ridges, M.D.  Electronically Signed     JOW/MEDQ  D:  11/03/2006  T:  11/03/2006  Job:  [id number]   cc:   Anselmo Rod, M.D.  Merlene Laughter. Renae Gloss, M.D.

## 2010-10-05 NOTE — H&P (Signed)
NAME:  Terry Shannon, Terry Shannon NO.:  [id number]   MEDICAL RECORD NO.:  [id number]          PATIENT TYPE:  OBV   LOCATION:  6533                         FACILITY:  [facility]   PHYSICIAN:  Vania Rea, M.D. DATE OF BIRTH:  June 13, 1955   DATE OF ADMISSION:  01/30/2008  DATE OF DISCHARGE:                              HISTORY & PHYSICAL   PRIMARY CARE PHYSICIAN:  Merlene Laughter. Renae Gloss, M.D.   CHIEF COMPLAINT:  Recurrent chest pains.   HISTORY OF PRESENT ILLNESS:  This is a 55 year old Caucasian lady with a  history of rheumatoid arthritis who was been having stuttering left-  sided chest pain for the past 24 hours.  The pain initially awoke her  from sleep last night. It was about a 5 out of 10,  and although she  awoke several times, she was able to go back to sleep without taking any  remedies.  She awoke this morning painfree, went to work but throughout  the day had episodic recurrent left-sided chest pain lasting about 5  minutes at a time.  She had no nausea, diaphoresis. No shortness of  breath.  There was no radiation of the pain.  She took an aspirin  without relief and, in fact, she can identify no precipitating,  aggravating or relieving factors.  Eventually, because of persistent  recurrence of the pain, the patient went an Urgent Care this evening  where an EKG was done which showed trigeminy and the patient was  transferred to the [facility] emergency room for further evaluation.   The patient has no prior history of cardiac evaluation.  She exercises  regularly on the treadmill and jogging about three times per week.  She  does not suffer with dyspnea on exertion, orthopnea or lower extremity  edema.  She gives no history of palpitations.   PAST MEDICAL HISTORY:  1. History of thyroid cancer, status post partial thyroidectomy over      20 years ago and total thyroidectomy in 2002.  2. History of migraine.  3. History of ulcerative colitis mesalamine.  4. She  is status post laparoscopic cholecystectomy in 2005.   MEDICATIONS:  1. Ambien 12.5 mg at bedtime.  2. Imitrex 100 mg p.r.n.  3. Mesalamine in the form of Lialda 1 tablet twice daily.  4. Nexium 40 mg daily.  5. Oxybutynin 10 mg daily.  6. Prempro 0.45/1.5 daily.  7. Pristiq 100 mg daily.  8. Synthroid 150 mg daily.  9. Zyrtec 10 mg daily.   ALLERGIES:  CODEINE and IVP CONTRAST DYES.   SOCIAL HISTORY:  She denies tobacco abuse.  She has one to two drinks of  alcohol per week.  Denies illicit drug use.  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   FAMILY HISTORY:  Diabetes, lung and colon cancers, and rheumatoid  arthritis.   REVIEW OF SYSTEMS:  The review of systems other than noted above, a 10-  point review of systems was unremarkable.   PHYSICAL EXAMINATION:  GENERAL:  Pleasant, middle-aged, Caucasian lady  sitting up in the stretcher in  no acute distress.  VITAL SIGNS:  Temperature is 97.9, pulse 67, respiration 18, blood  pressure 149/82.  She is saturating at 98% on room air.  She is having  no pain currently.  HEENT:  Her pupils are round and equal.  Mucous membranes are pink and  anicteric.  NECK:  She has bilateral enlarged lymph nodes in the upper anterior  cervical chain, about 1.5 cm bilaterally and nontender.  She has no  thyromegaly, of course.  She has large transverse suprasternal scar,  status post thyroidectomy.  CHEST:  Clear to auscultation bilaterally.  CARDIOVASCULAR:  She has irregularly irregular irregular rhythm.  ABDOMEN:  Her abdomen is soft and nontender.  There are no masses.  EXTREMITIES:  Without edema.  She has 2+ pulses bilaterally.  CENTRAL NERVOUS SYSTEM:  Cranial nerves II-XII are grossly intact.  She  has no focal neurologic deficit.   LABORATORY DATA:  CBC is unremarkable.  Her serum chemistry is likewise  completely unremarkable.  Her B natriuretic peptide is normal at 31 and  magnesium is 2.5.  Coags are  normal.  Cardiac enzymes completely normal  with undetectable troponin, myoglobin of only 49.  A chest x-ray shows  no acute disease.  Her urinalysis is completely bland.  EKG shows  frequent ectopics, about 20 ectopic beats per minute.   ASSESSMENT:  Chest pain and frequent ectopic beats in a middle-aged lady  with a history of rheumatoid arthritis; questionable whether related to  Synthroid use; questionable relation to side effect of pristiq or  oxybutynin.   PLAN:  Will admit this lady for observation.  Will get a 2-D echo.  Will  go ahead and start her beta blocker.  Will do cardiac enzymes and will  consider a cardiac stress test, inpatient versus outpatient      Vania Rea, M.D.  Electronically Signed     LC/MEDQ  D:  01/30/2008  T:  01/31/2008  Job:  [id number]   cc:   Merlene Laughter. Renae Gloss, M.D.

## 2010-10-08 NOTE — H&P (Signed)
[place]. [facility]  Patient:    Terry Shannon, Terry Shannon                        MRN: [id number] Adm. Date:  [id number] Attending:  Carlena Sax                         History and Physical  CHIEF COMPLAINT:  Left thyroid mass.  HISTORY OF PRESENT ILLNESS:  The patient is a 55 year old white female gives a history of undergoing a right thyroid lobectomy many years ago for a benign process.  Over the past three to four years, she has had a slowly enlarging mass involving the left thyroid lobe causing some minimal tenderness.  She is a patient of Ammie Dalton, M.D.  She has had an ultrasound of this mass which did show some cystic components.  MRI of the neck did reveal a large mass involving the left thyroid lobe 3 x 4 x 3 cm in dimension.  Physical examination did confirm this.  She has undergone fine needle aspiration biopsy of this mass which did show some follicular cells and now she presents for surgical excision.  MEDICATIONS: 1. Maxalt. 2. Ditropan. 3. Tobramax. 4. Serzone.  ALLERGIES:  CODEINE, SULFA, IV CONTRAST DYE.  PAST MEDICAL HISTORY: PAST SURGICAL HISTORY:  Significant for osteoarthritis, migraine headaches. Status post tonsillectomy and thyroid surgery as noted for benign tumor of the thyroid lobe and two prior foot surgeries.  HABITS:  The patient is a nonsmoker.  PHYSICAL EXAMINATION:  GENERAL:  The patient is a well-developed, well-nourished, 56 year old white female in no acute distress.  HEENT:  Head is normocephalic and atraumatic.  Eyes are PERRLA.  Extraocular muscles are intact.  Facial nerves intact bilaterally.  Ears; both TMs are intact without fluid.  NECK:  A large mass involving the left lobe of the thyroid gland, mobile, soft, and nontender.  The rest of the neck examination does not show any signs of cervical lymphadenopathy or other signs of abnormalities.  Oral cavity normal mucosa, teeth, gums, no lesion.   Nasal examination is unremarkable. Fiberoptic laryngoscopy did show that both true vocal cords are mobile without paralysis or paresis.  CHEST:  Clear to P&A.  HEART:  Regular rate and rhythm.  Normal S1 and S2 without S3, S4, or murmurs.  ABDOMEN:  Positive bowel sounds, soft without masses, distention, tenderness, or organomegaly.  Benign.  EXTREMITIES:  Full range of motion, warm, without clubbing, cyanosis, or edema.  NEUROLOGICAL:  Awake, alert, and oriented x 3.  Cranial nerves II-XII intact, nonfocal.  ASSESSMENT:  A 55 year old white female with a slowly enlarging mass involving the left thyroid lobe.  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  PLAN:  The patient will undergo completion thyroidectomy in removing the remainder of the thyroid lobe on the left side and the isthmus under general anesthesia.  Will be admitted postoperatively for calcium measurement and drain management.  I have discussed extensively with her the risks and benefits of surgery including risks of general anesthesia, infection, bleeding, injury to her recurrent largyneal nerve and parathyroid glands resulting in possible hypocalcemia.  I have entertained any questions and aswered them appropriately.  Informed consent has been obtained. The patient presents for surgery as noted. DD:  11/29/00 TD:  11/29/00 Job: [id number]

## 2010-10-08 NOTE — Op Note (Signed)
NAME:  Terry Shannon, Terry Shannon                         ACCOUNT NO.:  [id number]   MEDICAL RECORD NO.:  [id number]                   PATIENT TYPE:  AMB   LOCATION:  ENDO                                 FACILITY:  [facility]   PHYSICIAN:  Charna Elizabeth, M.D.                   DATE OF BIRTH:  23-Nov-1955   DATE OF PROCEDURE:  02/06/2002  DATE OF DISCHARGE:                                 OPERATIVE REPORT   PROCEDURE PERFORMED:  Colonoscopy with biopsies.   ENDOSCOPIST:  Charna Elizabeth, M.D.   INSTRUMENT USED:  Olympus video colonoscope.   INDICATIONS FOR PROCEDURE:  The patient is a 55 year old white female with a  history of rectal bleeding, mucoid stools, diarrhea, left lower quadrant  pain and family history of colon cancer.  The patient had a recent ESR or 85  on lab test, rule out inflammatory bowel disease.  The patient has a  personal history of thyroid cancer.   PREPROCEDURE PREPARATION:  Informed consent was procured from the patient.  The patient was fasted for eight hours prior to the procedure and prepped  with a bottle of magnesium citrate and a gallon of NuLytely the night prior  to the procedure.   PREPROCEDURE PHYSICAL:  The patient had stable vital signs. Neck supple.  Chest clear to auscultation.  S1 and S2 regular.  Abdomen soft with normal  bowel sounds.   DESCRIPTION OF PROCEDURE:  The patient was placed in left lateral decubitus  position and sedated with 150 mg of Demerol and 12.5 mg of Versed  intravenously.  Once the patient was adequately sedated and maintained on  low flow oxygen and continuous cardiac monitoring, the Olympus video  colonoscope was advanced in the rectum to the cecum to the terminal ileum  with extreme difficulty.  There were severe inflammatory changes in the  colonic mucosa from the rectum to the proximal right colon.  The terminal  ileum appeared normal.  The appendiceal orifice and ileocecal valve were  clearly visualized and appeared healthy as  well.  There was a large amount  of residual stool in the colon.  Multiple washes were done.  There were  severe changes seen in the left colon with mucoid exudate, friability and  bleeding from the colonic mucosa consistent with ulcerative colitis.  Small  nonbleeding internal hemorrhoids were seen on retroflexion in the rectum.  The patient tolerated the procedure well without complications.   IMPRESSION:  1. Severe inflammatory change seen throughout the colonic mucosa from the     rectum to the proximal right colon.  2. Normal terminal ileum.  3. Biopsies done to rule out ulcerative colitis.  4. Small nonbleeding internal hemorrhoids.   RECOMMENDATIONS:  1. Prednisone taper has been called in to the patient's pharmacy.  40 mg is     to be taken for the next 10 days and tapered to 30 mg for  10 days, 20 mg     for 10 days,  10 mg for 10 days, 5 mg for 10 days and stop; sulfasalazine     500 mg 2 pills t.i.d. has been advised for the next months.  Prescription     has been called in to her pharmacy as     well.  2. Avoid all nonsteroidals including aspirin.  3. Outpatient follow-up in the next week.                                                   Charna Elizabeth, M.D.    JM/MEDQ  D:  02/06/2002  T:  02/06/2002  Job:  [id number]   cc:   Lacretia Leigh. Quintella Reichert, M.D.

## 2010-10-08 NOTE — H&P (Signed)
NAME:  Terry Shannon, Terry Shannon                         ACCOUNT NO.:  [id number]   MEDICAL RECORD NO.:  [id number]                   PATIENT TYPE:  AMB   LOCATION:  SDC                                  FACILITY:  [facility]   PHYSICIAN:  Hal Morales, M.D.             DATE OF BIRTH:  06/08/1955   DATE OF ADMISSION:  02/09/2004  DATE OF DISCHARGE:                                HISTORY & PHYSICAL   HISTORY OF PRESENT ILLNESS:  The patient is a 55 year old, white, single  family, para 0 who presents for further evaluation and management of  abnormal uterine bleeding. The patient noticed this as a problem  approximately two years ago when she began having fairly irregular menses  sometime having menses once a month for a day and then having several months  of amenorrhea.  She was initially seen, however, because she had a bleeding  episode which lasted for approximately two weeks and had been proceeded by  six weeks of amenorrhea.  She has undergone evaluation with a Pap smear that  was within normal limits on December 30, 2003 and a normal thyroid panel on  December 22, 2003.  She underwent a sonohysterogram and ultrasound on January 12, 2004 showing a large left lateral uterine fibroid measuring 6.8 cm and a  4 mm echogenic focus within the endometrium thought to be most consistent  with a polyp.  No intracavitary fibroid could be appreciated at the time of  sonohysterogram.  The patient does present for resection of the endometrial  polyp. She has been managed for several months with Yasmin oral  contraceptive pills in an effort to allow for amenorrhea during the time of  her endometrial evaluation. Last menstrual period January 13, 2004, previous  menstrual period January 01, 2004.   PAST MEDICAL HISTORY:  Significant for thyroid disease with thyroid cancer  status post partial thyroidectomy approximately 20 years ago and then total  thyroidectomy July 2002.  Migraine headaches currently managed with  p.r.n.  Imitrex and p.r.n. analgesic.  Ulcerative colitis diagnosed in 2003  currently managed with Asacol, mixed incontinence currently managed with  physical therapy and Ditropan.   PAST SURGICAL HISTORY:  Tonsillectomy as a child, foot and finger and carpal  tunnel surgical procedures, thyroidectomy 2002.   CURRENT MEDICATIONS:  1.  Ditropan.  2.  Synthroid.  3.  Asacol.  4.  Imitrex p.r.n.  5.  Multivitamin.   DRUG SENSITIVITIES:  SULFA, CODEINE, CONTRAST DYE though the patient can use  Betadine without difficulty and NEOSPORIN.   FAMILY HISTORY:  Positive for cerebrovascular accident, arthritis and  asthma.   REVIEW OF SYMPTOMS:  Essentially negative except for the aforementioned  urinary incontinence and joint pain which are managed by integrative  therapy.   PHYSICAL EXAMINATION:  VITAL SIGNS:  Blood pressure is 110/70.  LUNGS:  Clear.  HEART:  Regular rate and rhythm.  ABDOMEN:  Soft without  masses or organomegaly.  PELVIC:  EGBUS within normal limits.  The vagina is rugose, the cervix is  without gross lesions and there is blood from the cervical os.  The uterus  is approximately eight weeks size, posterior and slightly irregular. There  are no separable adnexal masses.   IMPRESSION:  1.  Uterine fibroids with no evidence of intracavitary fibroid.  2.  Abnormal uterine bleeding, perimenopausal in nature.  3.  Probable endometrial polyp.  4.  History of thyroid cancer status post thyroidectomy.  5.  Ulcerative colitis.  6.  Urinary incontinence managed with physical therapy and Ditropan.   DISPOSITION:  A discussion is held with the patient concerning the  indications for her hysteroscopy D&C and resection of probable endometrial  polyp. This risks of anesthesia, bleeding, infection, damage to adjacent  organs and uterine perforation are reviewed in detail. The patient  acknowledges that she has had her questions answered and wishes to proceed.                                                Hal Morales, M.D.    VPH/MEDQ  D:  01/28/2004  T:  01/28/2004  Job:  [id number]

## 2010-10-08 NOTE — Op Note (Signed)
[place]. [facility]  Patient:    Terry Shannon, Terry Shannon Visit Number: [id number] MRN: [id number]          Service Type: DSU Location: [facility] Attending Physician:  Milly Jakob Dictated by:   Harvie Junior, M.D. Proc. Date: 07/13/01 Admit Date:  07/13/2001 Discharge Date: 07/13/2001                             Operative Report  PREOPERATIVE DIAGNOSIS:  Carpal tunnel syndrome, bilateral.  POSTOPERATIVE DIAGNOSIS:  Carpal tunnel syndrome, bilateral.  PROCEDURE:  Right carpal tunnel release.  SURGEON:  Harvie Junior, M.D.  ASSISTANT:  Currie Paris. Thedore Mins.  ANESTHESIA:  General.  BRIEF HISTORY:  A 55 year old female with a long history of having bilateral carpal tunnel syndrome.  She ultimately had electrodiagnostic studies showing she had severe bilateral, and thoughts were given toward needing surgery.  She ultimately failed all conservative therapies and is brought to the operating room for carpal tunnel release.  DESCRIPTION OF PROCEDURE:  The patient was brought to the operating room and after adequate anesthesia was achieved with a forearm-based IV regional, the patient was placed supine on the operating table.  The right arm was then prepped and draped in the usual sterile fashion.  Following this an incision was made just ulnar to the midline wrist crease, subcutaneous tissues taken down to the level of the volar carpal ligament.  It was clearly identified and divided, and care was taken to make sure that the median nerve was not adherent to the undersurface of the ligament, and the nerve was divided in a proximal-distal direction.  At this point a gloved finger could be placed in the wound proximally and distally, and the nerve was felt to be completely free.  The wound was then copiously irrigated and suctioned dry.  The wound was then closed with a combination of interrupted and running sutures.  A sterile compressive dressing was applied as  well as a volar plaster, and the patient was taken to the recovery room, where she was noted to be in satisfactory condition.  Estimated blood loss for the procedure was none. Dictated by:   Harvie Junior, M.D. Attending Physician:  Milly Jakob DD:  07/13/01 TD:  07/13/01 Job: [id number]

## 2010-10-08 NOTE — Op Note (Signed)
[place]  Patient:    Terry Shannon, Terry Shannon                        MRN: [id number] Proc. Date: 11/29/00 Adm. Date:  [id number] Attending:  Carlena Sax CC:         Ammie Dalton, M.D.  Bernadene Person, M.D.   Operative Report  PREOPERATIVE DIAGNOSIS:  Neoplasm left thyroid gland.  POSTOPERATIVE DIAGNOSIS:  Neoplasm left thyroid gland.  PROCEDURE:  Completion thyroidectomy with excision of left thyroid lobe and isthmus.  SURGEON:  Veverly Fells. Arletha Grippe, M.D.  ASSISTANT:  Kinnie Scales. Annalee Genta, M.D.  ANESTHESIA:  General endotracheal anesthesia.  INDICATIONS:  This is a 55 year old white female who has had a prior thyroid lobectomy involving the right thyroid lobe many years ago for benign process. She has had an enlarging mass involving the left thyroid lobe which was not only documented by Ammie Dalton, M.D., but also by myself.  MRI of the neck with and without gadolinium did confirm this mass to be about a 3 x 4 x 3 cm cystic and some solid areas.  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  Based on her history and physical examination, I have recommended proceeding with the above noted surgical procedure.  I have discussed extensively with her and her family the risks and benefits of surgery including risks of general anesthesia, infection, bleeding, injury to recurrent laryngeal nerve and parathyroid glands resulting in possible hypoparathyroidism.  I have entertained any questions, answered them appropriately.  Informed consent has been obtained.  The patient presents for above noted procedure.  FINDINGS:  Large mass involving the left thyroid lobe and involving a portion of the isthmus, not invading any of the parathyroid structures.  DESCRIPTION OF PROCEDURE: Due to the complexity of this surgery and the fact that she has had prior surgery in this area, a second assistant of Dr. Annalee Genta was  required for adequate retraction and expert second opinion during the case.  The patient was brought into the operating room and placed in the supine position.  General anesthesia administered via the anesthesiologist via the Zomed-Nims monitor endotracheal tube without difficulty.  The patients shoulders were placed on a shoulder roll.  Her head was placed on a donut.  The patients recurrent laryngeal nerve monitor was hooked up to the NIMS monitor and the endotracheal tube was noted to be in good position and therefore it was taped.  The patients neck was sterilely prepped and draped in the standard fashion.  The old incision was identified, it was injected with approximately 10 cc of 1% lidocaine solution with 1:100,000 epinephrine.  The old incision was totaly excised because of some hypertrophic scar that had been present.  This was a pretty lengthy excision, although, I did not need the total length of this excision, I felt that for cosmesis purposes that this would be appropriate to do.  The incision totally was about 10 cm in length.  Bleeding was controlled in the area with electrocautery.  Subplatysmal planes were elevated superiorly and inferiorly. Strap muscles were identified in the midline, were divided and retracted laterally.  The strap muscles were then both bluntly and sharply dissected off the thyroid lobe on the left side which was greatly enlarged.  Using blunt dissection, this lobe was mobilized into the wound.  The middle thyroid vein was identified.  It was divided and tied with 3-0 silk ties.  The inferior pedicle was identified using blunt dissection.  The inferior parathyroid gland on the left side was identified and preserved in total.  The recurrent largyneal nerve was identified within the midst of the pedicle and was confirmed with Zomed-NIMS nerve monitor with good response.  The inferior thyroid vessels were then divided and tied with 3-0 silk ties.   Dissection was carried up by preserving the recurrent laryngeal nerve.  The gland was dissected off of the trachea both bluntly and sharply without difficulty.  The dissection was not carried too far laterally on the right side due to the fact that I was concerned about possibly getting into the right recurrent laryngeal nerve due to the fact that she has had prior surgery as noted on that side. The superior vessels were identified as was the superior parathyroid gland on the left side which was preserved.  The vessels were then divided and tied off with 2-0 silk stick ties without difficulty.  The remaining portion of the gland connective berrys ligament was then sharply divided off of the trachea and cricoid cartilage preserving the recurrent laryngeal nerve on the left side. It was then removed through the wound and sent to surgical pathology to perform section analysis.  Bleeding from the area was controlled with bipolar cautery without difficulty.  The wound was irrigated and copious amounts of irrigation fluid suctioned dry and there was no evidence of any active bleeding.  A fully perforated 10 mm flat Blake drain was then placed through a separate stab incision on the left side of the inferior flap and was sutured to the skin with a 2-0 silk stitch.  The strap muscles were reapproximated with interrupted 3-0 Vicryl suture.  Platysmal layers were reapproximated with interrupted 3-0 Vicryl suture.  Subcutaneous tissues were reapproximated with interrupted 4-0 Vicryl suture and a running skin closure with a 5-0 nylon stitch was noted.  The patient was still intubated, but upon awakening, a significant amount of blood was noted to come into the drain itself with a large hematoma below the skin flap with some bleeding from the skin wound itself, therefore, I felt that it was imparative to keep the patient under general anesthesia and to keep her intubated.  I reexplored the wound  by removing a portion of the center portion of the sutures.  A large bleeder from  the external jugular which had been priorly tied off with a 2-0 silk stitch had popped off and was actively bleeding.  This was then suture ligated with a 2-0 silk tie without difficulty.  The wound was then irrigated of old blood and was suctioned dry.  There was no evidence of any active bleeding.  The drain was functioning well and therefore was not replaced.  The subplatysmal layers were reapproximated with interrupted 3-0 Vicryl suture.  Subcutaneous layers were reapproximated with interrupted 4-0 Vicryl suture and the skin suture was using a 5-0 nylon stitch.  Fluids given during the procedure was approximately 1800 cc of crystalloid. Estimated blood loss including the resulting hematoma that was drained intraoperatively was approximately 150 cc.  There were no packs.  Closed suction drain was placed as noted and specimen sent was left thyroid lobe. The patient tolerated the procedure well without complications and was extubated in the operating room and transferred to the recovery room in stable condition.  Sponge, needle, and instrument counts were correct at the end of the procedure.  Total duration of the procedure was approximately 2-1/2 hours. D:  11/29/00 TD:  11/29/00 Job: [id number]

## 2010-10-08 NOTE — Op Note (Signed)
NAME:  Terry Shannon, Terry Shannon NO.:  [id number]   MEDICAL RECORD NO.:  [id number]          PATIENT TYPE:  AMB   LOCATION:  DSC                          FACILITY:  [facility]   PHYSICIAN:  Harvie Junior, M.D.   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   POSTOPERATIVE DIAGNOSIS:  Arthritic change with significant osteophytes MTP  joint right.   PROCEDURE PERFORMED:  1. Resection of medial imminence in essence a silver bunionectomy.  2. Debridement of dorsal and medial osteophytes by way of cheilectomy with      irrigation and debridement of MTP joint.   SURGEON:  Harvie Junior, M.D.   ASSISTANTDeno Lunger.   ANESTHESIA:  General.   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  She had been treated with cheilectomy and debridement on the left  side and although she had a severe looking x-ray had done reasonably well  when right side began having significant pain and problems and because of  this we had evaluated her.  We have done steroid injection, other modalities  and none of this seemed to have worked for her.  Because of continued  complaints of pain, we ultimately felt that fusion would be the most  appropriate course of action.  She really was adamant about not wanting to  have a fusion.  We talked about the possibility of irrigation, debridement  with cheilectomy and she said that this had worked for other side and even  though she had severe disease she did wish to proceed with this and we  thought it was reasonable to try although had obvious concerns that it may  not be enough for her and ultimately lead to something like a cheilectomy.  She was brought to the operating room for this  procedure.   PROCEDURE:  The patient was brought to the operating room.  After adequate  anesthesia was obtained with general endotracheal, the patient was placed on  the operating table.  Right foot was prepped and draped in usual sterile  fashion.  Following this a midline incision was made over the great toe MTP  joint, subcutaneous tissue and down to the level of the extensor mechanism  which was retracted.  The metatarsus phalangeal joint was opened and at this  point synovectomy was performed.  At this point the dorsal imminence was  removed and about 20% of the metatarsal head with a sloping dorsal  configuration was obtained.  Following this attention was turned medially  where the medial imminence's of the metatarsal head was taken along in line  with the shaft of the metatarsal.  We then went down and took the medial  imminence off the distal phalanx, took the dorsal rim of the proximal  phalanx, took the medial imminence of the proximal phalanx, took the  medial  portion of the metatarsal.  Went down and did the plantar aspect of both of  these bones as well.  Did a thorough synovectomy of the joint.  Irrigated  and debrided.  Used the Fluro preoperatively and intraoperatively just to  see what we were accomplishing and then felt that we had done a very nice  job of taking off these osteophytes.  There was no frank cartilage injury  but there really was not much cartilage left.  There was tremendous amount  of wear.  At this point was irrigated and debrided the joint thoroughly.  Certainly was much more mobile and certainly had a lot more easy range than  what it had previously.  At this point the wounds were copiously irrigated.  Bone wax was used on all the exposed bony surfaces.  Metatarsal phalangeal  joint was then closed with 2-0 Vicryl running suture, the skin with 4-0  Vicryl suture.  A sterile compressive dressing was applied as well as a hard  soled shoe and the patient  was taken to the recovery room.  She was noted to  be in satisfactory condition.  The estimated blood loss for procedure was  none.  Tourniquet time was approximately 40 minutes.      Harvie Junior, M.D.  Electronically Signed     JLG/MEDQ  D:  12/16/2005  T:  12/17/2005  Job:  [id number]

## 2010-10-08 NOTE — Op Note (Signed)
NAME:  Terry Shannon, Terry Shannon NO.:  [id number]   MEDICAL RECORD NO.:  [id number]          PATIENT TYPE:  AMB   LOCATION:  SDC                           FACILITY:  [facility]   PHYSICIAN:  Hal Morales, M.D.DATE OF BIRTH:  1956/01/27   DATE OF PROCEDURE:  03/01/2004  DATE OF DISCHARGE:                                 OPERATIVE REPORT   PREOPERATIVE DIAGNOSES:  Abnormal uterine bleeding, endometrial polyp.   POSTOPERATIVE DIAGNOSES:  Abnormal uterine bleeding, endometrial polyp.   OPERATION:  Diagnostic hysteroscopy, diagnostic D&C, removal of endometrial  polyp.   ANESTHESIA:  General LMA.   ESTIMATED BLOOD LOSS:  Less than 25 mL.   COMPLICATIONS:  None.   FINDINGS:  The uterus sounded to 10 cm.  There was an intracavitary  endometrial polyp which seemed to measure approximately 2 cm.  The remainder  of the endometrial cavity was without visible lesions. There were no  intracavitary myomata.   DESCRIPTION OF PROCEDURE:  The patient was taken to the operating room  operating room after appropriate identification and placed on the operating  table. After the attainment of adequate general anesthesia, the patient was  placed in the lithotomy position. The perineum and vagina were prepped with  multiple layers of Betadine and a red Robinson catheter used to empty the  bladder. The perineum was draped as a sterile field. A Graves speculum was  placed in the vagina and a single tooth tenaculum placed on the anterior  cervix. A paracervical block was achieved with a total of 10 mL of 2%  Xylocaine in the 5 and 7 o'clock positions. The uterus was sounded to 10 cm.  The cervix was dilated to accommodate the diagnostic hysteroscope which was  used to evaluate the endometrial cavity with the above noted findings. Once  those findings were documented, the hysteroscope was removed and the Randall  stone forceps used to remove the polyp that was noted.  This was done in two  passes. The endometrial cavity was then evaluated with the hysteroscope and  noted to be free of any further lesions. The uterine cavity was curetted  with a small amount of uterine curettings. All instruments were then removed  from the vagina and the patient awakened from general anesthesia and taken  to the recovery room in satisfactory condition having tolerated the  procedure well with sponge and instrument counts correct after having been  given intravenous Toradol.   SPECIMEN TO PATHOLOGY:  Endometrial polyp and endometrial curetting.      VPH/MEDQ  D:  03/01/2004  T:  03/01/2004  Job:  [id number]

## 2010-10-08 NOTE — Discharge Summary (Signed)
[place]  Patient:    DEJHA, KING                        MRN: [id number] Adm. Date:  [id number] Disc. Date: 12/02/00 Attending:  Carlena Sax CC:         Ammie Dalton, M.D.  Bernadene Person, M.D.   Discharge Summary  ADMISSION DIAGNOSIS:  Left thyroid lobe mass.  PROCEDURE:  Completion of thyroidectomy on November 29, 2000.  HISTORY OF PRESENT ILLNESS:  Please note full dictated History and Physical examination as noted in chart.  The patient is a 55 year old, white female who has had a right thyroid lobectomy done many years ago in outside facility. She has a three- to four-month history of an enlarging left thyroid mass which is confirmed on physical examination and on MRI of the neck.  A fine-needle aspiration biopsy was consistent with follicular cells.  Therefore, we have recommended proceeding with a completion of thyroidectomy which she is to undergo here at this time.  Please note rest of dictated History and Physical examination located in the chart.  HOSPITAL COURSE:  The patient underwent completion of thyroidectomy on November 29, 2000, without complication.  She was transferred to the recovery room and then to the surgical nursing floor in stable condition.  The patient was placed on IV clindamycin.  The only major significant problem she had postoperatively was some migraine headaches which were controlled well with appropriate migraine medications including Imitrex.  She did have a significant amount of drain output from her Al Pimple drain and was therefore kept in for a total of three postoperative days.  Postoperative calciums were measured on postop day #1 and #2 which were 8.7 and 8.0 consecutively.  She had no signs or symptoms of hypocalcemia.  She denied any complaint of any perioral numbness or tingling.  She was kept on IV clindamycin throughout the duration of her hospital course.  She tolerated p.o. diet  without difficulty and was ambulating well.  On postop day #3, on December 02, 2000, the Al Pimple drain had put out only 10 cc in the last eight hours.  Therefore, it was removed at the bed side without difficulty.  A pressure bandage was placed around the patients neck and she was to be improved enough to be discharged to home.  DISCHARGE MEDICATIONS: 1. Levaquin 500 mg p.o. q.d. x 10 days. 2. Darvocet-N 100 #40 with no refills one tablet p.o. q.4h. p.r.n. pain.  DIET:  Regular.  ACTIVITY:  Regular.  WOUND CARE:  She is to remove the dressing on postop day #4 which will be December 03, 2000.  She may get the wound wet and shower without difficulty.  She will put bacitracin on the wound three times daily.  SPECIAL INSTRUCTIONS:  Both she and her family were given oral and written instructions.  They are to call with any problems with bleeding, fever, vomiting, pain, extra medications or any other questions.  FOLLOWUP:  She will follow up in the office for postoperative check and suture removal approximately one week after discharge.  CONDITION ON DISCHARGE:  Stable. DD:  12/02/00 TD:  12/02/00 Job: [id number]

## 2010-10-08 NOTE — Op Note (Signed)
[place]. [facility]  Patient:    Terry Shannon, Terry Shannon Visit Number: [id number] MRN: [id number]          Service Type: DSU Location: [facility] Attending Physician:  Milly Jakob Dictated by:   Harvie Junior, M.D. Proc. Date: 08/10/01 Admit Date:  08/10/2001 Discharge Date: 08/10/2001                             Operative Report  PREOPERATIVE DIAGNOSIS:  Painful carpal tunnel, left.  POSTOPERATIVE DIAGNOSIS:  Painful carpal tunnel, left.  OPERATION PERFORMED:  Left carpal tunnel release.  SURGEON:  Harvie Junior, M.D.  ASSISTANT:  Currie Paris. Thedore Mins.  ANESTHESIA:  Forearm based IV regional.  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  We ultimately released her right side and she did wonderfully.  Because of continued complaints of numbness and pain on the left side, she was brought to the operating room for a left carpal tunnel release.  DESCRIPTION OF PROCEDURE:  The patient was brought to the operating room and after adequate anesthesia was obtained with a forearm based IV regional, the patient was placed supine on the operating table.  The left arm was prepped and draped in the usual sterile fashion.  Following this, an incision was made just ulnar to the midline wrist crease.  Subcutaneous tissues were dissected down to the level of the volar carpal ligaments which were identified and divided.  A Freer elevator was then used to make sure that there was no adherence of the nerve on the undersurface of the carpal ligament.  The ligament was then divided both proximally and distally.  A gloved finger could be placed in the wound proximally and distally to make sure that the nerve was completely freed up.  It was dissected towards the radial side to identify the motor branch of the median nerve.  At this point the wound was copiously irrigated and suctioned dry.   At this point the skin was closed with a combination of interrupted and running suture.  A sterile compressive dressing was applied at this point.  The patient was taken to the recovery room where she was noted to be in satisfactory condition.  Estimated blood loss for this procedure was none. Dictated by:   Harvie Junior, M.D. Attending Physician:  Milly Jakob DD:  08/10/01 TD:  08/13/01 Job: [id number]

## 2011-02-21 LAB — CBC
Hemoglobin: 13.8
MCHC: 34.1
Platelets: 236
RDW: 12.9

## 2011-02-21 LAB — BASIC METABOLIC PANEL
BUN: 14
CO2: 25
Calcium: 9.3
Creatinine, Ser: 0.63
Glucose, Bld: 102 — ABNORMAL HIGH

## 2011-02-21 LAB — DIFFERENTIAL
Basophils Absolute: 0
Basophils Relative: 1
Monocytes Absolute: 0.8
Neutro Abs: 5.1
Neutrophils Relative %: 63

## 2011-02-23 LAB — CARDIAC PANEL(CRET KIN+CKTOT+MB+TROPI)
CK, MB: 1.2
Relative Index: INVALID
Total CK: 59
Total CK: 65
Troponin I: <0.01

## 2011-02-23 LAB — BASIC METABOLIC PANEL
GFR calc Af Amer: >60
GFR calc non Af Amer: >60
Potassium: 4.3
Sodium: 142

## 2011-02-23 LAB — COMPREHENSIVE METABOLIC PANEL
ALT: 28
AST: 24
Alkaline Phosphatase: 56
CO2: 29
Chloride: 107
GFR calc Af Amer: >60
GFR calc non Af Amer: >60
Potassium: 3.6
Sodium: 142
Total Bilirubin: 0.7

## 2011-02-23 LAB — CBC
Hemoglobin: 14.7
Platelets: 251
Platelets: 270
RDW: 13.2
RDW: 13.5
WBC: 6.9

## 2011-02-23 LAB — D-DIMER, QUANTITATIVE: D-Dimer, Quant: 0.31

## 2011-02-23 LAB — TROPONIN I: Troponin I: <0.01

## 2011-02-23 LAB — APTT: aPTT: 30

## 2011-02-23 LAB — URINE DRUGS OF ABUSE SCREEN W ALC, ROUTINE (REF LAB)
Amphetamine Screen, Ur: NEGATIVE
Ethyl Alcohol: <10
Marijuana Metabolite: NEGATIVE
Opiate Screen, Urine: NEGATIVE
Propoxyphene: NEGATIVE

## 2011-02-23 LAB — DIFFERENTIAL
Basophils Absolute: 0
Basophils Relative: 1
Eosinophils Absolute: 0.2
Eosinophils Relative: 3
Lymphs Abs: 2

## 2011-02-23 LAB — POCT CARDIAC MARKERS
CKMB, poc: 1
Myoglobin, poc: 49.4

## 2011-02-23 LAB — CK TOTAL AND CKMB (NOT AT ARMC): Relative Index: INVALID

## 2011-02-23 LAB — TSH: TSH: 0.302 — ABNORMAL LOW

## 2011-02-23 LAB — URINALYSIS, ROUTINE W REFLEX MICROSCOPIC
Hgb urine dipstick: NEGATIVE
Protein, ur: NEGATIVE
Urobilinogen, UA: 0.2

## 2011-03-10 LAB — COMPREHENSIVE METABOLIC PANEL
ALT: 24
Calcium: 9.3
Creatinine, Ser: 0.65
GFR calc Af Amer: >60
Glucose, Bld: 83
Sodium: 138
Total Protein: 6.2

## 2011-03-10 LAB — DIFFERENTIAL
Eosinophils Absolute: 0.4
Lymphocytes Relative: 25
Lymphs Abs: 1.7
Monocytes Relative: 10
Neutrophils Relative %: 58

## 2011-03-10 LAB — CBC
Hemoglobin: 14
MCHC: 33.6
RDW: 13.3

## 2011-05-16 ENCOUNTER — Ambulatory Visit (INDEPENDENT_AMBULATORY_CARE_PROVIDER_SITE_OTHER): Payer: BC Managed Care – PPO

## 2011-05-16 DIAGNOSIS — G43809 Other migraine, not intractable, without status migrainosus: Secondary | ICD-10-CM

## 2011-05-24 HISTORY — PX: KNEE ARTHROSCOPY: SHX127

## 2011-11-01 DIAGNOSIS — N3281 Overactive bladder: Secondary | ICD-10-CM | POA: Insufficient documentation

## 2011-11-01 DIAGNOSIS — K5190 Ulcerative colitis, unspecified, without complications: Secondary | ICD-10-CM | POA: Insufficient documentation

## 2011-11-01 DIAGNOSIS — G5600 Carpal tunnel syndrome, unspecified upper limb: Secondary | ICD-10-CM | POA: Insufficient documentation

## 2011-11-01 DIAGNOSIS — M069 Rheumatoid arthritis, unspecified: Secondary | ICD-10-CM | POA: Insufficient documentation

## 2011-11-01 DIAGNOSIS — M1990 Unspecified osteoarthritis, unspecified site: Secondary | ICD-10-CM | POA: Insufficient documentation

## 2011-11-01 DIAGNOSIS — C801 Malignant (primary) neoplasm, unspecified: Secondary | ICD-10-CM | POA: Insufficient documentation

## 2011-11-16 ENCOUNTER — Ambulatory Visit: Payer: Self-pay | Admitting: Obstetrics and Gynecology

## 2012-01-09 ENCOUNTER — Other Ambulatory Visit: Payer: Self-pay | Admitting: Orthopedic Surgery

## 2012-01-09 DIAGNOSIS — R52 Pain, unspecified: Secondary | ICD-10-CM

## 2012-01-09 DIAGNOSIS — S83209A Unspecified tear of unspecified meniscus, current injury, unspecified knee, initial encounter: Secondary | ICD-10-CM

## 2012-01-10 ENCOUNTER — Ambulatory Visit
Admission: RE | Admit: 2012-01-10 | Discharge: 2012-01-10 | Disposition: A | Discharge status: Home / Self Care | Payer: No Typology Code available for payment source | Source: Ambulatory Visit | Attending: Orthopedic Surgery | Admitting: Orthopedic Surgery

## 2012-01-10 DIAGNOSIS — R52 Pain, unspecified: Secondary | ICD-10-CM

## 2012-01-10 DIAGNOSIS — S83209A Unspecified tear of unspecified meniscus, current injury, unspecified knee, initial encounter: Secondary | ICD-10-CM

## 2012-02-16 ENCOUNTER — Ambulatory Visit (INDEPENDENT_AMBULATORY_CARE_PROVIDER_SITE_OTHER): Payer: No Typology Code available for payment source | Admitting: Family Medicine

## 2012-02-16 VITALS — BP 140/80 | HR 81 | Temp 98.6°F | Resp 18 | Ht 66.5 in | Wt 152.0 lb

## 2012-02-16 DIAGNOSIS — T783XXA Angioneurotic edema, initial encounter: Secondary | ICD-10-CM

## 2012-02-16 MED ORDER — PREDNISOLONE SODIUM PHOSPHATE 15 MG/5ML PO SOLN
20.0000 mg | Freq: Once | ORAL | Status: AC
  Administered 2012-02-16: 20 mg via ORAL

## 2012-02-16 MED ORDER — PREDNISONE 50 MG PO TABS: ORAL_TABLET | ORAL | Status: DC

## 2012-02-16 NOTE — Progress Notes (Signed)
Patient ID: Terry Shannon, female   DOB: [birth year], 56 y.o.   MRN: [id number] Terry Shannon is a 56 y.o. female who presents to Urgent Care today for rash and angioedema:  1.  Rash and angioedema:  Patient with increasing itchy rash that started today about 2 pm.  Noted on back and spreading to shoulders.  This evening while eating dinner at about 6 pm her mouth felt "funny" and someone at dinner mentioned it looked larger than usual.  She looked in mirror and noted swelling in her mouth and became scared and presented to [facility] at that time.    Of note, patient took Hydrocodone today at 10 AM and again at 5 PM.  Started on this for knee surgery 2 weeks ago, taking regularly for past 2 weeks.  Also restarted Pristiq today about 10 AM after being off of it for 5 days.  Has been on this for years.  She has taken a total of 4 Benadryl today, starting at 2 pm.    No drooling, no difficulty swallowing, no trouble breathing, no lip or throat tingling.  No recent plant or outdoor exposure.     PMH reviewed.  ROS as above otherwise neg.  No chest pain, palpitations, SOB, Fever, Chills, Abd pain, N/V/D.  Medications reviewed. Current Outpatient Prescriptions  Medication Sig Dispense Refill  . cyclobenzaprine (FLEXERIL) 10 MG tablet Take 10 mg by mouth as needed.      . desvenlafaxine (PRISTIQ) 100 MG 24 hr tablet Take 100 mg by mouth daily.      Marland Kitchen esomeprazole (NEXIUM) 40 MG capsule Take 40 mg by mouth daily before breakfast.      . HYDROmorphone (DILAUDID) 2 MG tablet Take 2 mg by mouth every 6 (six) hours as needed.      . mesalamine (LIALDA) 1.2 G EC tablet Take 1,200 mg by mouth daily with breakfast.      . oxybutynin (DITROPAN-XL) 10 MG 24 hr tablet Take 10 mg by mouth daily.      . predniSONE (DELTASONE) 50 MG tablet Take 1 tab po daily  5 tablet  0  . thyroid (ARMOUR) 60 MG tablet Take 60 mg by mouth 2 (two) times daily.      Marland Kitchen zolpidem (AMBIEN CR) 12.5 MG CR tablet Take 12.5 mg by mouth at  bedtime as needed.       Current Facility-Administered Medications  Medication Dose Route Frequency Provider Last Rate Last Dose  . prednisoLONE (ORAPRED) 15 MG/5ML solution 20 mg  60 mg Oral Once Tobey Grim, MD   20 mg at 02/16/12 2028    Exam:  BP 140/80  Pulse 81  Temp 98.6 F (37 C) (Oral)  Resp 18  Ht 5' 6.5" (1.689 m)  Wt 152 lb (68.947 kg)  BMI 24.17 kg/m2  SpO2 98% Gen:  Alert, cooperative patient who appears stated age in no acute distress.  Vital signs reviewed.  Speaking in complete sentences, no difficulty breathing, no drooling.  Comfortable appearing. Head:  [place]/AT Eyes:  EOMI, PERRL.  Sclera and conjunctiva non-erythematous and non-icteric. Nose:  Nares patent Ears:  Canals clear BL.  TM's pearly gray BL without effusion or retraction Mouth:  Left side of lower bucal mucosa edematous, extending from midline laterally.  No upper lip edema.  No tongue or internal mucosal swelling noted.  MMM.  Tonsils +2 BL without erythema or edema noted Neck:  No lymphadenopathy noted.  Trachea midline.   Pulm:  Clear  to auscultation bilaterally with good air movement.  No wheezes or rales noted.  No increased respiratory rate. Cardiac:  Regular rate and rhythm without murmur auscultated.  Good S1/S2. Skin:  Scattered erythematous macules across Left shoulder, Right shoulder, back.  Some urticarial patches noted across abdomen.  None noted BL upper lower extremities.    Assessment and Plan:  1. Allergic reaction:  Angioedema plus urticaria.  Treated here at [facility] with 60 mg of Prednisolone (20 ml of 15 mg/40mL). She was observed for about an hour and a half with improvement of urticaria.  No worsening of angioedema, no trouble with airway during observation.  As she was doing well we discharged her home with prescription for Prednisone for the next 5 days.  I also offered Atarax for itching and hives as well as generalized anti-histamine effects, patient declined this and wanted to  remain on Q6 Benadryl.  Provided oral and written red flags that would trigger return here or to ED.  Patient expressed understanding.   2.  Knee pain:  Likely hydromorphone as trigger for hives and angioedema.  She has sensitivities to oxycodone, hydrocodone, and tramadol.  She is about 2 weeks out from arthroscopy.  I am concerned that hydromorphone is culprit and that she should stop taking this.  She has follow up with Orthopedist tomorrow, discussed that she should talk with him about further pain management for her knee.  Recommended 800 mg Ibuprofen but she would rather take extra-strength Tylenol.  Recommended she start this as soon as she get home to prevent getting behind on pain control.

## 2012-02-16 NOTE — Patient Instructions (Signed)
I think you should stop taking the Hydrocodone. Take the extra-strength Tylenol or Ibuprofen as soon as you get home for pain.   Take the Predisone 1 pill a day for the next 5 days.  Keep taking the Benadryl for the next several days for itching and then you can back off to as needed.   If you start having any itching on your tongue, tingling, trouble swallowing, trouble breathing, or worsening swelling make sure that you come back or head to the ED.    Angioedema Angioedema (AE) is a sudden swelling of the eyelids, lips, lobes of ears, external genitalia, skin, and other parts of the body. AE can happen by itself. It usually begins during the night and is found on awakening. It can happen with hives and other allergic reactions. Attacks can be mild and annoying, or life-threatening if the air passages swell. AE generally occurs in a short time period (over minutes to hours) and gets better in 24 to 48 hours. It usually does not cause any serious problems.  There are 2 different kinds of AE:   Allergic AE.   Nonallergic AE.   There may be an overreaction or direct stimulation of cells that are a part of the immune system (mast cells).   There may be problems with the release of chemicals made by the body that cause swelling and inflammation (kinins). AE due to kinins can be inherited from parents (hereditary), or it can develop on its own (acquired). Acquired AE either shows up before, or along with, certain diseases or is due to the body's immune system attacking parts of the body's own cells (autoimmune).  CAUSES  Allergic  AE due to allergic reactions are caused by something that causes the body to react (trigger). Common triggers include:   Foods.   Medicines.   Latex.   Direct contact with certain fruits, vegetables, or animal saliva.   Insect stings.  Nonallergic  Mast cell stimulation may be caused by:   Medicines.   Dyes used in X-rays.   The body's own immune system  reactions to parts of the body (autoimmune disease).   Possibly, some virus infections.   AE due to problems with kinins can be hereditary or acquired. Attacks are triggered by:   Mild injury.   Dental work or any surgery.   Stress.   Sudden changes in temperature.   Exercise.   Medicines.   AE due to problems with kinins can also be due to certain medicines, especially blood pressure medicines like angiotensin-converting enzyme (ACE) inhibitors. African Americans are at nearly 5 times greater risk of developing AE than Caucasians from ACE inhibitors.  SYMPTOMS  Allergic symptoms:  Non-itchy swelling of the skin. Often the swelling is on the face and lips, but any area of the skin can swell. Sometimes, the swelling can be painful. If hives are present, there is intense itching.   Breathing problems if the air passages swell.  Nonallergic symptoms:  If internal organs are involved, there may be:   Nausea.   Abdominal pain.   Vomiting.   Difficulty swallowing.   Difficulty passing urine.   Breathing problems if the air passages swell.  Depending on the cause of AE, episodes may:  Only happen once (if triggers are removed or avoided).   Come back in unpredictable patterns.   Repeat for several years and then gradually fade away.  DIAGNOSIS  AE is diagnosed by:   Asking questions to find out how fast  the symptoms began.   Taking a family history.   Physical exam.   Diagnostic tests. Tests could include:   Allergy skin tests to see if the problem is allergic.   Blood tests to diagnose hereditary and some acquired types of AE.   Other tests to see if there is a hidden disease leading to the AE.  TREATMENT  Treatment depends on the type and cause (if any) of the AE. Allergic  Allergic types of AE are treated with:   Immediate removal of the trigger or medicine (if any).   Epinephrine injection.   Steroids.   Antihistamines.   Hospitalization for  severe attacks.  Nonallergic  Mast cell stimulation types of AE are treated with:   Immediate removal of the trigger or medicine (if any).   Epinephrine injection.   Steroids.   Antihistamines.   Hospitalization for severe attacks.   Hereditary AE is treated with:   Medicines to prevent and treat attacks. There is little response to antihistamines, epinephrine, or steroids.   Preventive medicines before dental work or surgery.   Removing or avoiding medicines that trigger attacks.   Hospitalization for severe attacks.   Acquired AE is treated with:   Treating underlying disease (if any).   Medicines to prevent and treat attacks.  HOME CARE INSTRUCTIONS   Always carry your emergency allergy treatment medicines with you.   Wear a medical bracelet.   Avoid known triggers.  SEEK MEDICAL CARE IF:   You get repeat attacks.   Your attacks are more frequent or more severe despite preventive measures.   You have hereditary AE and are considering having children. It is important to discuss the risks of passing this on to your children.  SEEK IMMEDIATE MEDICAL CARE IF:   You have difficulty breathing.   You have difficulty swallowing.   You experience fainting.  This condition should be treated immediately. It can be life-threatening if it involves throat swelling. Document Released: 07/18/2001 Document Revised: 04/28/2011 Document Reviewed: 05/08/2008 [facility] Patient Information 2012 [place], [place].

## 2012-08-07 ENCOUNTER — Telehealth: Payer: Self-pay | Admitting: Neurology

## 2012-08-07 NOTE — Telephone Encounter (Signed)
Ok to come in for [employer] and see work in MD

## 2012-08-07 NOTE — Telephone Encounter (Signed)
Pt calling for migraine relief.   Would like to come in for depacon infusion.  Please advise.   From last note botox option, prn depacon?

## 2012-08-08 ENCOUNTER — Ambulatory Visit (INDEPENDENT_AMBULATORY_CARE_PROVIDER_SITE_OTHER): Payer: No Typology Code available for payment source | Admitting: *Deleted

## 2012-08-08 VITALS — BP 147/90 | HR 63 | Temp 98.0°F

## 2012-08-08 DIAGNOSIS — G43109 Migraine with aura, not intractable, without status migrainosus: Secondary | ICD-10-CM

## 2012-08-08 MED ORDER — VALPROATE SODIUM 500 MG/5ML IV SOLN
1000.0000 mg | INTRAVENOUS | Status: DC
  Administered 2012-08-08: 1000 mg via INTRAVENOUS

## 2012-08-08 NOTE — Progress Notes (Signed)
Pt here for migraine headache which she has had for 3 days.  R eye pain, radiating to back of head.  No relief from prescribed meds.  Level 4.  Also with light sensitivity. Bp 147/90, p- 63, oral temp 98.0.  Iv placed and and infusion started at 1053.  Lights dimmed and pt to reclining position.  Made comfortable.  IV finished and no relief with headache.   IV out gauze and pressure applied, bandage applied.    Still at level 4.  Consulted Dr. Terrace Arabia and no other meds offered.  Checked with Denese Killings at check out re: botox .  Pt to check out. NAD.

## 2012-08-08 NOTE — Telephone Encounter (Signed)
Called pt and she will come in today at 1030 for depacon infusion 1000mg  IV.

## 2012-08-12 ENCOUNTER — Ambulatory Visit (INDEPENDENT_AMBULATORY_CARE_PROVIDER_SITE_OTHER): Payer: No Typology Code available for payment source | Admitting: Family Medicine

## 2012-08-12 VITALS — BP 147/85 | HR 66 | Temp 97.4°F | Resp 18 | Ht 67.0 in | Wt 159.0 lb

## 2012-08-12 DIAGNOSIS — L309 Dermatitis, unspecified: Secondary | ICD-10-CM

## 2012-08-12 DIAGNOSIS — L259 Unspecified contact dermatitis, unspecified cause: Secondary | ICD-10-CM

## 2012-08-12 DIAGNOSIS — K649 Unspecified hemorrhoids: Secondary | ICD-10-CM

## 2012-08-12 MED ORDER — HYDROCORTISONE ACETATE 25 MG RE SUPP: 25.0000 mg | Freq: Two times a day (BID) | RECTAL | Status: DC

## 2012-08-12 NOTE — Progress Notes (Signed)
Subjective:    Patient ID: Terry Shannon, female    DOB: [birth year], 57 y.o.   MRN: [id number]  HPI JALEY Shannon is a 57 y.o. female  ?inflamed hemorrhoid - past month - on and off improvement with preparation H. Also notes a bump in anal area that won't heal - past month.  Tried polysporin - improves a little then flares up.  Minimal blood on tissue with wiping. Does use wipes after toilet tissue, and has used polysporin at times to red areas. No sitz baths.   No fever, no abd pain, no n/v, no unexplained wt loss.   Normal BM's 1-2 times per day -  soft stools.  No straining.   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  No hx of perirectal abcesses or fistula.    Review of Systems As above.     Objective:   Physical Exam  Vitals reviewed. Constitutional: She is oriented to person, place, and time. She appears well-developed and well-nourished.  Pulmonary/Chest: Effort normal.  Genitourinary: Rectal exam shows external hemorrhoid (2 small ext hemorrhoids on L, nonthrombosed. ). Rectal exam shows no fissure.     Few erythematous papular areas in perianal skin - each approx 5mm. No induration, no exudate.    Neurological: She is alert and oriented to person, place, and time.  Psychiatric: She has a normal mood and affect. Her behavior is normal.      Assessment & Plan:  ANASTASIJA Shannon is a 57 y.o. female Hemorrhoids - Plan: hydrocortisone (ANUSOL-HC) 25 MG suppository in place of preparation H - BID prn for next week-10 days, and sitz baths. If not improved, consider GI eval with hx of U.C.  Perianal dermatitis - stop polysporin and cleansing wipes.  Trial of sitz baths. rtc precautions as above.   Meds ordered this encounter  Medications  . hydrocortisone (ANUSOL-HC) 25 MG suppository    Sig: Place 1 suppository (25 mg total) rectally 2 (two) times daily.    Dispense:  24 suppository    Refill:  0     Patient Instructions  Try the new suppositories  twice per day, epsom soaks, and avoid wipes for now. If external rash not improving with this treatment recheck in office or with Dr. Loreta Ave.  If hemorrhoids not improving over next 2 weeks - recheck or follow up with your gastroenterologist.  Return to the clinic or go to the nearest emergency room if any of your symptoms worsen or new symptoms occur. Hemorrhoids Hemorrhoids are enlarged (dilated) veins around the rectum. There are 2 types of hemorrhoids, and the type of hemorrhoid is determined by its location. Internal hemorrhoids occur in the veins just inside the rectum.They are usually not painful, but they may bleed.However, they may poke through to the outside and become irritated and painful. External hemorrhoids involve the veins outside the anus and can be felt as a painful swelling or hard lump near the anus.They are often itchy and may crack and bleed. Sometimes clots will form in the veins. This makes them swollen and painful. These are called thrombosed hemorrhoids. CAUSES Causes of hemorrhoids include:  Pregnancy. This increases the pressure in the hemorrhoidal veins.  Constipation.  Straining to have a bowel movement.  Obesity.  Heavy lifting or other activity that caused you to strain. TREATMENT Most of the time hemorrhoids improve in 1 to 2 weeks. However, if symptoms do not seem to be getting better or if you have a  lot of rectal bleeding, your caregiver may perform a procedure to help make the hemorrhoids get smaller or remove them completely.Possible treatments include:  Rubber band ligation. A rubber band is placed at the base of the hemorrhoid to cut off the circulation.  Sclerotherapy. A chemical is injected to shrink the hemorrhoid.  Infrared light therapy. Tools are used to burn the hemorrhoid.  Hemorrhoidectomy. This is surgical removal of the hemorrhoid. HOME CARE INSTRUCTIONS   Increase fiber in your diet. Ask your caregiver about using fiber  supplements.  Drink enough water and fluids to keep your urine clear or pale yellow.  Exercise regularly.  Go to the bathroom when you have the urge to have a bowel movement. Do not wait.  Avoid straining to have bowel movements.  Keep the anal area dry and clean.  Only take over-the-counter or prescription medicines for pain, discomfort, or fever as directed by your caregiver. If your hemorrhoids are thrombosed:  Take warm sitz baths for 20 to 30 minutes, 3 to 4 times per day.  If the hemorrhoids are very tender and swollen, place ice packs on the area as tolerated. Using ice packs between sitz baths may be helpful. Fill a plastic bag with ice. Place a towel between the bag of ice and your skin.  Medicated creams and suppositories may be used or applied as directed.  Do not use a donut-shaped pillow or sit on the toilet for long periods. This increases blood pooling and pain. SEEK MEDICAL CARE IF:   You have increasing pain and swelling that is not controlled with your medicine.  You have uncontrolled bleeding.  You have difficulty or you are unable to have a bowel movement.  You have pain or inflammation outside the area of the hemorrhoids.  You have chills or an oral temperature above 102 F (38.9 C). MAKE SURE YOU:   Understand these instructions.  Will watch your condition.  Will get help right away if you are not doing well or get worse. Document Released: 05/06/2000 Document Revised: 08/01/2011 Document Reviewed: 04/19/2010 [facility] Patient Information 2013 [place], [place].

## 2012-08-12 NOTE — Patient Instructions (Addendum)
Try the new suppositories twice per day, epsom soaks, and avoid wipes for now. If external rash not improving with this treatment recheck in office or with Dr. Loreta Ave.  If hemorrhoids not improving over next 2 weeks - recheck or follow up with your gastroenterologist.  Return to the clinic or go to the nearest emergency room if any of your symptoms worsen or new symptoms occur. Hemorrhoids Hemorrhoids are enlarged (dilated) veins around the rectum. There are 2 types of hemorrhoids, and the type of hemorrhoid is determined by its location. Internal hemorrhoids occur in the veins just inside the rectum.They are usually not painful, but they may bleed.However, they may poke through to the outside and become irritated and painful. External hemorrhoids involve the veins outside the anus and can be felt as a painful swelling or hard lump near the anus.They are often itchy and may crack and bleed. Sometimes clots will form in the veins. This makes them swollen and painful. These are called thrombosed hemorrhoids. CAUSES Causes of hemorrhoids include:  Pregnancy. This increases the pressure in the hemorrhoidal veins.  Constipation.  Straining to have a bowel movement.  Obesity.  Heavy lifting or other activity that caused you to strain. TREATMENT Most of the time hemorrhoids improve in 1 to 2 weeks. However, if symptoms do not seem to be getting better or if you have a lot of rectal bleeding, your caregiver may perform a procedure to help make the hemorrhoids get smaller or remove them completely.Possible treatments include:  Rubber band ligation. A rubber band is placed at the base of the hemorrhoid to cut off the circulation.  Sclerotherapy. A chemical is injected to shrink the hemorrhoid.  Infrared light therapy. Tools are used to burn the hemorrhoid.  Hemorrhoidectomy. This is surgical removal of the hemorrhoid. HOME CARE INSTRUCTIONS   Increase fiber in your diet. Ask your caregiver  about using fiber supplements.  Drink enough water and fluids to keep your urine clear or pale yellow.  Exercise regularly.  Go to the bathroom when you have the urge to have a bowel movement. Do not wait.  Avoid straining to have bowel movements.  Keep the anal area dry and clean.  Only take over-the-counter or prescription medicines for pain, discomfort, or fever as directed by your caregiver. If your hemorrhoids are thrombosed:  Take warm sitz baths for 20 to 30 minutes, 3 to 4 times per day.  If the hemorrhoids are very tender and swollen, place ice packs on the area as tolerated. Using ice packs between sitz baths may be helpful. Fill a plastic bag with ice. Place a towel between the bag of ice and your skin.  Medicated creams and suppositories may be used or applied as directed.  Do not use a donut-shaped pillow or sit on the toilet for long periods. This increases blood pooling and pain. SEEK MEDICAL CARE IF:   You have increasing pain and swelling that is not controlled with your medicine.  You have uncontrolled bleeding.  You have difficulty or you are unable to have a bowel movement.  You have pain or inflammation outside the area of the hemorrhoids.  You have chills or an oral temperature above 102 F (38.9 C). MAKE SURE YOU:   Understand these instructions.  Will watch your condition.  Will get help right away if you are not doing well or get worse. Document Released: 05/06/2000 Document Revised: 08/01/2011 Document Reviewed: 04/19/2010 [facility] Patient Information 2013 [place], [place].

## 2012-09-20 HISTORY — PX: HAMMER TOE SURGERY: SHX385

## 2012-09-28 ENCOUNTER — Other Ambulatory Visit (HOSPITAL_COMMUNITY): Payer: Self-pay | Admitting: Internal Medicine

## 2012-09-28 DIAGNOSIS — Z1231 Encounter for screening mammogram for malignant neoplasm of breast: Secondary | ICD-10-CM

## 2012-10-08 ENCOUNTER — Ambulatory Visit (HOSPITAL_COMMUNITY): Payer: No Typology Code available for payment source

## 2012-10-23 ENCOUNTER — Ambulatory Visit (HOSPITAL_COMMUNITY)
Admission: RE | Admit: 2012-10-23 | Discharge: 2012-10-23 | Disposition: A | Discharge status: Home / Self Care | Payer: BC Managed Care – PPO | Source: Ambulatory Visit | Attending: Internal Medicine | Admitting: Internal Medicine

## 2012-10-23 DIAGNOSIS — Z1231 Encounter for screening mammogram for malignant neoplasm of breast: Secondary | ICD-10-CM | POA: Insufficient documentation

## 2012-10-29 ENCOUNTER — Encounter: Payer: Self-pay | Admitting: Neurology

## 2012-10-30 ENCOUNTER — Encounter: Payer: Self-pay | Admitting: Neurology

## 2012-11-05 ENCOUNTER — Other Ambulatory Visit: Payer: Self-pay | Admitting: Physical Medicine and Rehabilitation

## 2012-11-05 ENCOUNTER — Other Ambulatory Visit: Payer: BC Managed Care – PPO

## 2012-11-05 DIAGNOSIS — M545 Low back pain, unspecified: Secondary | ICD-10-CM

## 2012-11-07 ENCOUNTER — Ambulatory Visit
Admission: RE | Admit: 2012-11-07 | Discharge: 2012-11-07 | Disposition: A | Discharge status: Home / Self Care | Payer: BC Managed Care – PPO | Source: Ambulatory Visit | Attending: Physical Medicine and Rehabilitation | Admitting: Physical Medicine and Rehabilitation

## 2012-11-07 DIAGNOSIS — M545 Low back pain, unspecified: Secondary | ICD-10-CM

## 2012-11-08 ENCOUNTER — Encounter: Payer: Self-pay | Admitting: Neurology

## 2013-01-24 ENCOUNTER — Ambulatory Visit: Payer: BC Managed Care – PPO

## 2013-01-24 ENCOUNTER — Ambulatory Visit (INDEPENDENT_AMBULATORY_CARE_PROVIDER_SITE_OTHER): Payer: BC Managed Care – PPO | Admitting: Family Medicine

## 2013-01-24 VITALS — BP 128/70 | HR 71 | Temp 98.6°F | Resp 18 | Wt 173.8 lb

## 2013-01-24 DIAGNOSIS — M79609 Pain in unspecified limb: Secondary | ICD-10-CM

## 2013-01-24 DIAGNOSIS — IMO0002 Reserved for concepts with insufficient information to code with codable children: Secondary | ICD-10-CM

## 2013-01-24 DIAGNOSIS — M79672 Pain in left foot: Secondary | ICD-10-CM

## 2013-01-24 DIAGNOSIS — L03012 Cellulitis of left finger: Secondary | ICD-10-CM

## 2013-01-24 LAB — POCT CBC
Granulocyte percent: 69.2 %G (ref 37–80)
HCT, POC: 44.3 % (ref 37.7–47.9)
Hemoglobin: 14.2 g/dL (ref 12.2–16.2)
Lymph, poc: 2.1 (ref 0.6–3.4)
MCV: 95.5 fL (ref 80–97)
POC LYMPH PERCENT: 22.3 %L (ref 10–50)
RDW, POC: 14.5 %

## 2013-01-24 MED ORDER — DOXYCYCLINE HYCLATE 100 MG PO CAPS: 100.0000 mg | ORAL_CAPSULE | Freq: Two times a day (BID) | ORAL | Status: DC

## 2013-01-24 MED ORDER — CEPHALEXIN 500 MG PO CAPS: 500.0000 mg | ORAL_CAPSULE | Freq: Three times a day (TID) | ORAL | Status: DC

## 2013-01-24 NOTE — Progress Notes (Deleted)
  Subjective:    Patient ID: Terry Shannon, female    DOB: 05/12/1956, 57 y.o.   MRN: [id number]  HPI    Review of Systems     Objective:   Physical Exam        Assessment & Plan:

## 2013-01-24 NOTE — Progress Notes (Signed)
  Subjective:    Patient ID: Terry Shannon, female    DOB: 1956-01-18, 57 y.o.   MRN: [id number]  HPI Patient with 2nd toe of left foot pain started yesterday with redness at tip. Redness and pain significantly worse this morning.Thought it was from a hangnail and clipped the nail. Did not disturb her sleep last night. She took aspirin with some relief and applied neosporin.  Dr. Victorino Dike did hammer toe surgery 3/14. Had infected pin in 3rd toe following surgery and had to have pins removed early and 2 week course of antibiotics. To her knowledge, wound was not cultured at that time.  No fever.  Review of Systems No history of gout.    Objective:   Physical Exam Left 2nd toe with redness from distal joint. Slightly warm to touch. Small white area next to lateral aspect of nail. Area of redness extends up foot to ankle. Strong pedal pulse. Tender to palpation.  Results for orders placed in visit on 01/24/13  POCT CBC      Result Value Range   WBC 9.6  4.6 - 10.2 K/uL   Lymph, poc 2.1  0.6 - 3.4   POC LYMPH PERCENT 22.3  10 - 50 %L   MID (cbc) 0.8  0 - 0.9   POC MID % 8.5  0 - 12 %M   POC Granulocyte 6.6  2 - 6.9   Granulocyte percent 69.2  37 - 80 %G   RBC 4.64  4.04 - 5.48 M/uL   Hemoglobin 14.2  12.2 - 16.2 g/dL   HCT, POC 82.9  56.2 - 47.9 %   MCV 95.5  80 - 97 fL   MCH, POC 30.6  27 - 31.2 pg   MCHC 32.1  31.8 - 35.4 g/dL   RDW, POC 13.0     Platelet Count, POC 297  142 - 424 K/uL   MPV 9.4  0 - 99.8 fL        Assessment & Plan:  Left foot pain - Plan: DG Foot Complete Left  Paronychia, left - Plan: Wound culture, POCT CBC, Sedimentation Rate, CANCELED: POCT SEDIMENTATION RATE  Meds ordered this encounter  Medications  . doxycycline (VIBRAMYCIN) 100 MG capsule    Sig: Take 1 capsule (100 mg total) by mouth 2 (two) times daily.    Dispense:  20 capsule    Refill:  0  . cephALEXin (KEFLEX) 500 MG capsule    Sig: Take 1 capsule (500 mg total) by mouth 3 (three)  times daily.    Dispense:  30 capsule    Refill:  0      Instructed to return if increased redness. Will try to see Dr. Victorino Dike tomorrow for followup.

## 2013-01-26 ENCOUNTER — Telehealth: Payer: Self-pay

## 2013-01-26 NOTE — Telephone Encounter (Signed)
Patient is returning Dr. Michaelle Copas call. Says she is doing better, antibiotic is helping a lot. She still has redness on her toe, but the rest of her foot is back to normal. She appreciates the call.

## 2013-01-28 LAB — WOUND CULTURE

## 2013-01-31 ENCOUNTER — Telehealth: Payer: Self-pay

## 2013-01-31 NOTE — Telephone Encounter (Signed)
See labs 

## 2013-01-31 NOTE — Telephone Encounter (Signed)
Pt calling about labs. Please review.  

## 2013-01-31 NOTE — Telephone Encounter (Signed)
Sent results to lab pool; please call patient if has not been contacted with wound culture results.

## 2013-03-04 NOTE — Progress Notes (Signed)
History and physical exam obtained with Deboraha Sprang, NP.  White area of fluctuants next to nailbed denuded in office; white drainage scant amount expressed; wound culture obtained.  A/P:  Paronychia with cellulitis:  New.  Send wound culture.  Treat with Keflex while awaiting wound culture results.  Due to infection of third toe after surgery in recent past, recommend follow-up with ortho/podiatry tomorrow/Friday.  RTC immediately if clinically worsens with fever, increasing redness, increasing pain or swelling.  Pt expressed understanding.

## 2013-03-18 ENCOUNTER — Other Ambulatory Visit: Payer: Self-pay | Admitting: Orthopedic Surgery

## 2013-03-21 ENCOUNTER — Encounter (HOSPITAL_COMMUNITY): Payer: Self-pay | Admitting: Pharmacy Technician

## 2013-03-22 ENCOUNTER — Ambulatory Visit (HOSPITAL_COMMUNITY)
Admission: RE | Admit: 2013-03-22 | Discharge: 2013-03-22 | Disposition: A | Discharge status: Home / Self Care | Payer: BC Managed Care – PPO | Source: Ambulatory Visit | Attending: Orthopedic Surgery | Admitting: Orthopedic Surgery

## 2013-03-22 ENCOUNTER — Encounter (HOSPITAL_COMMUNITY)
Admission: RE | Admit: 2013-03-22 | Discharge: 2013-03-22 | Disposition: A | Discharge status: Home / Self Care | Payer: BC Managed Care – PPO | Source: Ambulatory Visit | Attending: Orthopedic Surgery | Admitting: Orthopedic Surgery

## 2013-03-22 ENCOUNTER — Encounter (HOSPITAL_COMMUNITY): Payer: Self-pay

## 2013-03-22 DIAGNOSIS — Z01812 Encounter for preprocedural laboratory examination: Secondary | ICD-10-CM | POA: Insufficient documentation

## 2013-03-22 DIAGNOSIS — Z01810 Encounter for preprocedural cardiovascular examination: Secondary | ICD-10-CM | POA: Insufficient documentation

## 2013-03-22 DIAGNOSIS — M1710 Unilateral primary osteoarthritis, unspecified knee: Secondary | ICD-10-CM | POA: Insufficient documentation

## 2013-03-22 DIAGNOSIS — K5190 Ulcerative colitis, unspecified, without complications: Secondary | ICD-10-CM | POA: Insufficient documentation

## 2013-03-22 DIAGNOSIS — Z01818 Encounter for other preprocedural examination: Secondary | ICD-10-CM | POA: Insufficient documentation

## 2013-03-22 DIAGNOSIS — C73 Malignant neoplasm of thyroid gland: Secondary | ICD-10-CM | POA: Insufficient documentation

## 2013-03-22 HISTORY — DX: Hypothyroidism, unspecified: E03.9

## 2013-03-22 HISTORY — DX: Adverse effect of unspecified anesthetic, initial encounter: T41.45XA

## 2013-03-22 HISTORY — DX: Other complications of anesthesia, initial encounter: T88.59XA

## 2013-03-22 LAB — URINALYSIS, ROUTINE W REFLEX MICROSCOPIC
Bilirubin Urine: NEGATIVE
Glucose, UA: NEGATIVE mg/dL
Hgb urine dipstick: NEGATIVE
Ketones, ur: NEGATIVE mg/dL
Leukocytes, UA: NEGATIVE
pH: 7 (ref 5.0–8.0)

## 2013-03-22 LAB — CBC WITH DIFFERENTIAL/PLATELET
Basophils Absolute: 0 10*3/uL (ref 0.0–0.1)
Basophils Relative: 1 % (ref 0–1)
Eosinophils Absolute: 0.3 10*3/uL (ref 0.0–0.7)
Eosinophils Relative: 4 % (ref 0–5)
HCT: 41.1 % (ref 36.0–46.0)
Hemoglobin: 14.2 g/dL (ref 12.0–15.0)
Lymphocytes Relative: 29 % (ref 12–46)
MCHC: 34.5 g/dL (ref 30.0–36.0)
MCV: 89.2 fL (ref 78.0–100.0)
Monocytes Absolute: 0.8 10*3/uL (ref 0.1–1.0)
Monocytes Relative: 13 % — ABNORMAL HIGH (ref 3–12)
Neutro Abs: 3.4 10*3/uL (ref 1.7–7.7)
RDW: 13.7 % (ref 11.5–15.5)

## 2013-03-22 LAB — COMPREHENSIVE METABOLIC PANEL
ALT: 33 U/L (ref 0–35)
BUN: 14 mg/dL (ref 6–23)
CO2: 28 mEq/L (ref 19–32)
Calcium: 9.3 mg/dL (ref 8.4–10.5)
Chloride: 104 mEq/L (ref 96–112)
Creatinine, Ser: 0.54 mg/dL (ref 0.50–1.10)
GFR calc Af Amer: >90 mL/min (ref >90)
GFR calc non Af Amer: >90 mL/min (ref >90)
Sodium: 142 mEq/L (ref 135–145)
Total Bilirubin: 0.2 mg/dL — ABNORMAL LOW (ref 0.3–1.2)
Total Protein: 6.8 g/dL (ref 6.0–8.3)

## 2013-03-22 LAB — PROTIME-INR
INR: 0.88 (ref 0.00–1.49)
Prothrombin Time: 11.8 seconds (ref 11.6–15.2)

## 2013-03-22 LAB — TYPE AND SCREEN: ABO/RH(D): O POS

## 2013-03-22 LAB — APTT: aPTT: 32 seconds (ref 24–37)

## 2013-03-22 LAB — SURGICAL PCR SCREEN: MRSA, PCR: NEGATIVE

## 2013-03-22 NOTE — Pre-Procedure Instructions (Signed)
MALAI LADY  03/22/2013   Your procedure is scheduled on:  Monday April 01, 2013 at 1237 PM  Report to [facility] cone Short Stay Main Entrance "A" at 1037AM.  Call this number if you have problems the morning of surgery: [phone]   Remember:   Do not eat food or drink liquids after midnight Sunday   Take these medicines the morning of surgery with A SIP OF WATER: Thyroid (Armour), Lyrica, and Nexium   Stop all Vitamins Fish oil, Aspirin and Nsaid's (Motrin, Advil, Naproxen and aleve)   Do not wear jewelry, make-up or nail polish.  Do not wear lotions, powders, or perfumes. You may wear deodorant.  Do not shave 48 hours prior to surgery.  Do not bring valuables to the hospital.  [facility] is not responsible for any belongings or valuables.               Contacts, dentures or bridgework may not be worn into surgery.  Leave suitcase in the car. After surgery it may be brought to your room.  For patients admitted to the hospital, discharge time is determined by your  treatment team.               Patients discharged the day of surgery will not be allowed to drive home.    Special Instructions: Incentive Spirometry - Practice and bring it with you on the day of surgery. Shower using CHG 2 nights before surgery and the night before surgery.  If you shower the day of surgery use CHG.  Use special wash - you have one bottle of CHG for all showers.  You should use approximately 1/3 of the bottle for each shower.   Please read over the following fact sheets that you were given: Pain Booklet, Coughing and Deep Breathing, Blood Transfusion Information, MRSA Information and Surgical Site Infection Prevention

## 2013-03-31 MED ORDER — CEFAZOLIN SODIUM-DEXTROSE 2-3 GM-% IV SOLR
2.0000 g | INTRAVENOUS | Status: AC
  Administered 2013-04-01: 2 g via INTRAVENOUS
  Filled 2013-03-31: qty 50

## 2013-04-01 ENCOUNTER — Inpatient Hospital Stay (HOSPITAL_COMMUNITY)
Admission: RE | Admit: 2013-04-01 | Discharge: 2013-04-03 | DRG: 470 | Disposition: A | Discharge status: Home Health | Payer: BC Managed Care – PPO | Source: Ambulatory Visit | Attending: Orthopedic Surgery | Admitting: Orthopedic Surgery

## 2013-04-01 ENCOUNTER — Encounter (HOSPITAL_COMMUNITY): Payer: Self-pay

## 2013-04-01 ENCOUNTER — Inpatient Hospital Stay (HOSPITAL_COMMUNITY): Payer: BC Managed Care – PPO | Admitting: Certified Registered Nurse Anesthetist

## 2013-04-01 ENCOUNTER — Encounter (HOSPITAL_COMMUNITY)
Admission: RE | Disposition: A | Discharge status: Home Health | Payer: Self-pay | Source: Ambulatory Visit | Attending: Orthopedic Surgery

## 2013-04-01 ENCOUNTER — Encounter (HOSPITAL_COMMUNITY): Payer: BC Managed Care – PPO | Admitting: Certified Registered Nurse Anesthetist

## 2013-04-01 DIAGNOSIS — Z888 Allergy status to other drugs, medicaments and biological substances status: Secondary | ICD-10-CM

## 2013-04-01 DIAGNOSIS — Z882 Allergy status to sulfonamides status: Secondary | ICD-10-CM

## 2013-04-01 DIAGNOSIS — Z801 Family history of malignant neoplasm of trachea, bronchus and lung: Secondary | ICD-10-CM

## 2013-04-01 DIAGNOSIS — N318 Other neuromuscular dysfunction of bladder: Secondary | ICD-10-CM | POA: Diagnosis present

## 2013-04-01 DIAGNOSIS — Z9089 Acquired absence of other organs: Secondary | ICD-10-CM

## 2013-04-01 DIAGNOSIS — Z833 Family history of diabetes mellitus: Secondary | ICD-10-CM

## 2013-04-01 DIAGNOSIS — M1711 Unilateral primary osteoarthritis, right knee: Secondary | ICD-10-CM | POA: Diagnosis present

## 2013-04-01 DIAGNOSIS — G4730 Sleep apnea, unspecified: Secondary | ICD-10-CM | POA: Diagnosis present

## 2013-04-01 DIAGNOSIS — Z800 Family history of malignant neoplasm of digestive organs: Secondary | ICD-10-CM

## 2013-04-01 DIAGNOSIS — M069 Rheumatoid arthritis, unspecified: Secondary | ICD-10-CM | POA: Diagnosis present

## 2013-04-01 DIAGNOSIS — G43909 Migraine, unspecified, not intractable, without status migrainosus: Secondary | ICD-10-CM | POA: Diagnosis present

## 2013-04-01 DIAGNOSIS — M1710 Unilateral primary osteoarthritis, unspecified knee: Principal | ICD-10-CM | POA: Diagnosis present

## 2013-04-01 DIAGNOSIS — Z823 Family history of stroke: Secondary | ICD-10-CM

## 2013-04-01 DIAGNOSIS — E039 Hypothyroidism, unspecified: Secondary | ICD-10-CM | POA: Diagnosis present

## 2013-04-01 DIAGNOSIS — Z886 Allergy status to analgesic agent status: Secondary | ICD-10-CM

## 2013-04-01 DIAGNOSIS — G5600 Carpal tunnel syndrome, unspecified upper limb: Secondary | ICD-10-CM | POA: Diagnosis present

## 2013-04-01 HISTORY — DX: Other chronic pain: G89.29

## 2013-04-01 HISTORY — DX: Low back pain: M54.5

## 2013-04-01 HISTORY — PX: TOTAL KNEE ARTHROPLASTY: SHX125

## 2013-04-01 HISTORY — DX: Low back pain, unspecified: M54.50

## 2013-04-01 HISTORY — DX: Sleep apnea, unspecified: G47.30

## 2013-04-01 HISTORY — DX: Depression, unspecified: F32.A

## 2013-04-01 HISTORY — DX: Gastro-esophageal reflux disease without esophagitis: K21.9

## 2013-04-01 HISTORY — DX: Major depressive disorder, single episode, unspecified: F32.9

## 2013-04-01 HISTORY — DX: Unspecified osteoarthritis, unspecified site: M19.90

## 2013-04-01 SURGERY — ARTHROPLASTY, KNEE, TOTAL
Anesthesia: General | Site: Knee | Laterality: Right | Wound class: Clean

## 2013-04-01 MED ORDER — METHOCARBAMOL 100 MG/ML IJ SOLN
500.0000 mg | INTRAVENOUS | Status: AC
  Administered 2013-04-01: 500 mg via INTRAVENOUS
  Filled 2013-04-01: qty 5

## 2013-04-01 MED ORDER — ONDANSETRON HCL 4 MG/2ML IJ SOLN
4.0000 mg | Freq: Four times a day (QID) | INTRAMUSCULAR | Status: DC | PRN
  Administered 2013-04-01: 4 mg via INTRAVENOUS
  Filled 2013-04-01: qty 2

## 2013-04-01 MED ORDER — THYROID 60 MG PO TABS
90.0000 mg | ORAL_TABLET | Freq: Every day | ORAL | Status: DC
  Administered 2013-04-02 – 2013-04-03 (×2): 90 mg via ORAL
  Filled 2013-04-01 (×4): qty 1

## 2013-04-01 MED ORDER — ONDANSETRON HCL 4 MG/2ML IJ SOLN
INTRAMUSCULAR | Status: DC | PRN
  Administered 2013-04-01: 4 mg via INTRAVENOUS

## 2013-04-01 MED ORDER — BUPIVACAINE-EPINEPHRINE PF 0.5-1:200000 % IJ SOLN
INTRAMUSCULAR | Status: DC | PRN
  Administered 2013-04-01: 30 mL

## 2013-04-01 MED ORDER — DEXAMETHASONE SODIUM PHOSPHATE 10 MG/ML IJ SOLN
INTRAMUSCULAR | Status: AC
  Filled 2013-04-01: qty 1

## 2013-04-01 MED ORDER — VENLAFAXINE HCL ER 150 MG PO CP24
150.0000 mg | ORAL_CAPSULE | Freq: Every day | ORAL | Status: DC
  Administered 2013-04-02 – 2013-04-03 (×2): 150 mg via ORAL
  Filled 2013-04-01 (×3): qty 1

## 2013-04-01 MED ORDER — OXYBUTYNIN CHLORIDE ER 10 MG PO TB24
10.0000 mg | ORAL_TABLET | Freq: Every day | ORAL | Status: DC
  Administered 2013-04-01 – 2013-04-02 (×2): 10 mg via ORAL
  Filled 2013-04-01 (×3): qty 1

## 2013-04-01 MED ORDER — PHENYLEPHRINE HCL 10 MG/ML IJ SOLN
INTRAMUSCULAR | Status: DC | PRN
  Administered 2013-04-01: 40 ug via INTRAVENOUS
  Administered 2013-04-01: 80 ug via INTRAVENOUS
  Administered 2013-04-01: 40 ug via INTRAVENOUS
  Administered 2013-04-01: 80 ug via INTRAVENOUS
  Administered 2013-04-01: 40 ug via INTRAVENOUS

## 2013-04-01 MED ORDER — ALUM & MAG HYDROXIDE-SIMETH 200-200-20 MG/5ML PO SUSP: 30.0000 mL | ORAL | Status: DC | PRN

## 2013-04-01 MED ORDER — CEFAZOLIN SODIUM-DEXTROSE 2-3 GM-% IV SOLR
2.0000 g | Freq: Four times a day (QID) | INTRAVENOUS | Status: AC
  Administered 2013-04-01 – 2013-04-02 (×2): 2 g via INTRAVENOUS
  Filled 2013-04-01 (×2): qty 50

## 2013-04-01 MED ORDER — HYDROMORPHONE HCL 2 MG PO TABS
2.0000 mg | ORAL_TABLET | ORAL | Status: DC | PRN
  Administered 2013-04-01 – 2013-04-02 (×2): 2 mg via ORAL
  Administered 2013-04-02: 4 mg via ORAL
  Administered 2013-04-02 (×2): 2 mg via ORAL
  Administered 2013-04-03 (×3): 4 mg via ORAL
  Filled 2013-04-01 (×3): qty 2
  Filled 2013-04-01 (×2): qty 1
  Filled 2013-04-01: qty 2
  Filled 2013-04-01: qty 1

## 2013-04-01 MED ORDER — FENTANYL CITRATE 0.05 MG/ML IJ SOLN
INTRAMUSCULAR | Status: DC | PRN
  Administered 2013-04-01 (×7): 50 ug via INTRAVENOUS

## 2013-04-01 MED ORDER — NEOSTIGMINE METHYLSULFATE 1 MG/ML IJ SOLN
INTRAMUSCULAR | Status: DC | PRN
  Administered 2013-04-01: 4 mg via INTRAVENOUS

## 2013-04-01 MED ORDER — EPHEDRINE SULFATE 50 MG/ML IJ SOLN
INTRAMUSCULAR | Status: DC | PRN
  Administered 2013-04-01: 10 mg via INTRAVENOUS
  Administered 2013-04-01: 5 mg via INTRAVENOUS

## 2013-04-01 MED ORDER — PREGABALIN 50 MG PO CAPS
50.0000 mg | ORAL_CAPSULE | Freq: Two times a day (BID) | ORAL | Status: DC
  Filled 2013-04-01 (×2): qty 1

## 2013-04-01 MED ORDER — SODIUM CHLORIDE 0.9 % IV SOLN
INTRAVENOUS | Status: DC
  Administered 2013-04-01 – 2013-04-02 (×2): via INTRAVENOUS

## 2013-04-01 MED ORDER — GLYCOPYRROLATE 0.2 MG/ML IJ SOLN
INTRAMUSCULAR | Status: DC | PRN
  Administered 2013-04-01: 0.6 mg via INTRAVENOUS

## 2013-04-01 MED ORDER — HYDROMORPHONE HCL PF 1 MG/ML IJ SOLN
1.0000 mg | INTRAMUSCULAR | Status: DC | PRN
  Administered 2013-04-01: 1 mg via INTRAVENOUS
  Administered 2013-04-02: 2 mg via INTRAVENOUS
  Filled 2013-04-01: qty 1
  Filled 2013-04-01: qty 2

## 2013-04-01 MED ORDER — BISACODYL 10 MG RE SUPP: 10.0000 mg | Freq: Every day | RECTAL | Status: DC | PRN

## 2013-04-01 MED ORDER — POVIDONE-IODINE 7.5 % EX SOLN: Freq: Once | CUTANEOUS | Status: DC

## 2013-04-01 MED ORDER — PROMETHAZINE HCL 25 MG/ML IJ SOLN
12.5000 mg | Freq: Four times a day (QID) | INTRAMUSCULAR | Status: DC | PRN
  Administered 2013-04-02: 12.5 mg via INTRAVENOUS
  Filled 2013-04-01: qty 1

## 2013-04-01 MED ORDER — METHOCARBAMOL 500 MG PO TABS
500.0000 mg | ORAL_TABLET | Freq: Four times a day (QID) | ORAL | Status: DC | PRN
  Filled 2013-04-01: qty 1

## 2013-04-01 MED ORDER — DIPHENHYDRAMINE HCL 12.5 MG/5ML PO ELIX: 12.5000 mg | ORAL_SOLUTION | ORAL | Status: DC | PRN

## 2013-04-01 MED ORDER — PROPOFOL 10 MG/ML IV BOLUS
INTRAVENOUS | Status: DC | PRN
  Administered 2013-04-01: 200 mg via INTRAVENOUS
  Administered 2013-04-01: 40 mg via INTRAVENOUS

## 2013-04-01 MED ORDER — ESOMEPRAZOLE MAGNESIUM 40 MG PO CPDR
40.0000 mg | DELAYED_RELEASE_CAPSULE | Freq: Every day | ORAL | Status: DC
  Administered 2013-04-02 – 2013-04-03 (×2): 40 mg via ORAL
  Filled 2013-04-01 (×4): qty 1

## 2013-04-01 MED ORDER — DEXAMETHASONE SODIUM PHOSPHATE 10 MG/ML IJ SOLN
10.0000 mg | Freq: Once | INTRAMUSCULAR | Status: AC
  Administered 2013-04-01: 10 mg via INTRAVENOUS
  Filled 2013-04-01: qty 1

## 2013-04-01 MED ORDER — CEFUROXIME SODIUM 1.5 G IJ SOLR
INTRAMUSCULAR | Status: DC | PRN
  Administered 2013-04-01: 1.5 g

## 2013-04-01 MED ORDER — LIDOCAINE HCL (CARDIAC) 20 MG/ML IV SOLN
INTRAVENOUS | Status: DC | PRN
  Administered 2013-04-01: 80 mg via INTRAVENOUS

## 2013-04-01 MED ORDER — LACTATED RINGERS IV SOLN
INTRAVENOUS | Status: DC
  Administered 2013-04-01: 12:00:00 via INTRAVENOUS

## 2013-04-01 MED ORDER — FENTANYL CITRATE 0.05 MG/ML IJ SOLN
INTRAMUSCULAR | Status: AC
  Administered 2013-04-01: 100 ug
  Filled 2013-04-01: qty 2

## 2013-04-01 MED ORDER — LACTATED RINGERS IV SOLN
INTRAVENOUS | Status: DC | PRN
  Administered 2013-04-01 (×2): via INTRAVENOUS

## 2013-04-01 MED ORDER — HYDROMORPHONE HCL PF 1 MG/ML IJ SOLN
0.2500 mg | INTRAMUSCULAR | Status: DC | PRN
  Administered 2013-04-01 (×4): 0.5 mg via INTRAVENOUS

## 2013-04-01 MED ORDER — MIDAZOLAM HCL 2 MG/2ML IJ SOLN
INTRAMUSCULAR | Status: AC
  Administered 2013-04-01: 2 mg
  Filled 2013-04-01: qty 2

## 2013-04-01 MED ORDER — PROMETHAZINE HCL 25 MG/ML IJ SOLN: 6.2500 mg | INTRAMUSCULAR | Status: DC | PRN

## 2013-04-01 MED ORDER — CEFUROXIME SODIUM 1.5 G IJ SOLR
INTRAMUSCULAR | Status: AC
  Filled 2013-04-01: qty 1.5

## 2013-04-01 MED ORDER — ONDANSETRON HCL 4 MG PO TABS: 4.0000 mg | ORAL_TABLET | Freq: Four times a day (QID) | ORAL | Status: DC | PRN

## 2013-04-01 MED ORDER — HYDROMORPHONE HCL PF 1 MG/ML IJ SOLN
INTRAMUSCULAR | Status: AC
  Administered 2013-04-01: 0.5 mg via INTRAVENOUS
  Filled 2013-04-01: qty 2

## 2013-04-01 MED ORDER — HYDROMORPHONE HCL 2 MG PO TABS
ORAL_TABLET | ORAL | Status: AC
  Filled 2013-04-01: qty 1

## 2013-04-01 MED ORDER — SODIUM CHLORIDE 0.9 % IR SOLN
Status: DC | PRN
  Administered 2013-04-01: 3000 mL

## 2013-04-01 MED ORDER — TRANEXAMIC ACID 100 MG/ML IV SOLN
1000.0000 mg | INTRAVENOUS | Status: AC
  Administered 2013-04-01: 1000 mg via INTRAVENOUS
  Filled 2013-04-01: qty 10

## 2013-04-01 MED ORDER — SUCCINYLCHOLINE CHLORIDE 20 MG/ML IJ SOLN
INTRAMUSCULAR | Status: DC | PRN
  Administered 2013-04-01: 100 mg via INTRAVENOUS

## 2013-04-01 MED ORDER — METHOCARBAMOL 100 MG/ML IJ SOLN
500.0000 mg | Freq: Four times a day (QID) | INTRAVENOUS | Status: DC | PRN
  Administered 2013-04-01: 500 mg via INTRAVENOUS
  Filled 2013-04-01 (×2): qty 5

## 2013-04-01 MED ORDER — DEXAMETHASONE 6 MG PO TABS
10.0000 mg | ORAL_TABLET | Freq: Three times a day (TID) | ORAL | Status: AC
  Administered 2013-04-01: 10 mg via ORAL
  Filled 2013-04-01 (×2): qty 1

## 2013-04-01 MED ORDER — DEXAMETHASONE SODIUM PHOSPHATE 10 MG/ML IJ SOLN
10.0000 mg | Freq: Three times a day (TID) | INTRAMUSCULAR | Status: AC
  Administered 2013-04-01 – 2013-04-02 (×2): 10 mg via INTRAVENOUS
  Filled 2013-04-01 (×2): qty 1

## 2013-04-01 MED ORDER — ZOLPIDEM TARTRATE 5 MG PO TABS
5.0000 mg | ORAL_TABLET | Freq: Every evening | ORAL | Status: DC | PRN
  Administered 2013-04-02 (×2): 5 mg via ORAL
  Filled 2013-04-01 (×2): qty 1

## 2013-04-01 MED ORDER — MESALAMINE 1.2 G PO TBEC
1200.0000 mg | DELAYED_RELEASE_TABLET | Freq: Two times a day (BID) | ORAL | Status: DC
  Administered 2013-04-01 – 2013-04-03 (×4): 1.2 g via ORAL
  Filled 2013-04-01 (×5): qty 1

## 2013-04-01 MED ORDER — ASPIRIN EC 325 MG PO TBEC
325.0000 mg | DELAYED_RELEASE_TABLET | Freq: Two times a day (BID) | ORAL | Status: DC
  Administered 2013-04-01 – 2013-04-03 (×4): 325 mg via ORAL
  Filled 2013-04-01 (×6): qty 1

## 2013-04-01 MED ORDER — HYDROMORPHONE HCL 2 MG PO TABS: 2.0000 mg | ORAL_TABLET | ORAL | Status: DC | PRN

## 2013-04-01 MED ORDER — POLYETHYLENE GLYCOL 3350 17 G PO PACK: 17.0000 g | PACK | Freq: Every day | ORAL | Status: DC | PRN

## 2013-04-01 MED ORDER — ROCURONIUM BROMIDE 100 MG/10ML IV SOLN
INTRAVENOUS | Status: DC | PRN
  Administered 2013-04-01: 30 mg via INTRAVENOUS

## 2013-04-01 MED ORDER — BUPIVACAINE LIPOSOME 1.3 % IJ SUSP
20.0000 mL | INTRAMUSCULAR | Status: DC
  Filled 2013-04-01: qty 20

## 2013-04-01 MED ORDER — FERROUS SULFATE 325 (65 FE) MG PO TABS
325.0000 mg | ORAL_TABLET | Freq: Two times a day (BID) | ORAL | Status: DC
  Administered 2013-04-02 – 2013-04-03 (×3): 325 mg via ORAL
  Filled 2013-04-01 (×5): qty 1

## 2013-04-01 MED ORDER — DOCUSATE SODIUM 100 MG PO CAPS
100.0000 mg | ORAL_CAPSULE | Freq: Two times a day (BID) | ORAL | Status: DC
  Administered 2013-04-01 – 2013-04-03 (×4): 100 mg via ORAL
  Filled 2013-04-01 (×4): qty 1

## 2013-04-01 MED ORDER — SODIUM CHLORIDE 0.9 % IJ SOLN
INTRAMUSCULAR | Status: DC | PRN
  Administered 2013-04-01: 14:00:00

## 2013-04-01 MED ORDER — BUPIVACAINE HCL (PF) 0.25 % IJ SOLN
INTRAMUSCULAR | Status: AC
  Filled 2013-04-01: qty 30

## 2013-04-01 MED ORDER — 0.9 % SODIUM CHLORIDE (POUR BTL) OPTIME
TOPICAL | Status: DC | PRN
  Administered 2013-04-01: 1000 mL

## 2013-04-01 SURGICAL SUPPLY — 61 items
APL SKNCLS STERI-STRIP NONHPOA (GAUZE/BANDAGES/DRESSINGS) ×1
BANDAGE ESMARK 6X9 LF (GAUZE/BANDAGES/DRESSINGS) ×1 IMPLANT
BENZOIN TINCTURE PRP APPL 2/3 (GAUZE/BANDAGES/DRESSINGS) ×2 IMPLANT
BLADE SAGITTAL 25.0X1.19X90 (BLADE) ×2 IMPLANT
BLADE SAW SAG 90X13X1.27 (BLADE) ×2 IMPLANT
BNDG CMPR 9X6 STRL LF SNTH (GAUZE/BANDAGES/DRESSINGS) ×1
BNDG ESMARK 6X9 LF (GAUZE/BANDAGES/DRESSINGS) ×2
BOWL SMART MIX CTS (DISPOSABLE) ×2 IMPLANT
CAPT RP KNEE ×1 IMPLANT
CEMENT HV SMART SET (Cement) ×4 IMPLANT
CLOTH BEACON ORANGE TIMEOUT ST (SAFETY) ×2 IMPLANT
CLSR STERI-STRIP ANTIMIC 1/2X4 (GAUZE/BANDAGES/DRESSINGS) ×1 IMPLANT
COVER SURGICAL LIGHT HANDLE (MISCELLANEOUS) ×2 IMPLANT
CUFF TOURNIQUET SINGLE 34IN LL (TOURNIQUET CUFF) ×2 IMPLANT
CUFF TOURNIQUET SINGLE 44IN (TOURNIQUET CUFF) IMPLANT
DRAPE EXTREMITY T 121X128X90 (DRAPE) ×2 IMPLANT
DRAPE U-SHAPE 47X51 STRL (DRAPES) ×2 IMPLANT
DRSG PAD ABDOMINAL 8X10 ST (GAUZE/BANDAGES/DRESSINGS) ×2 IMPLANT
DURAPREP 26ML APPLICATOR (WOUND CARE) ×2 IMPLANT
ELECT REM PT RETURN 9FT ADLT (ELECTROSURGICAL) ×2
ELECTRODE REM PT RTRN 9FT ADLT (ELECTROSURGICAL) ×1 IMPLANT
EVACUATOR 1/8 PVC DRAIN (DRAIN) ×2 IMPLANT
FACESHIELD LNG OPTICON STERILE (SAFETY) ×2 IMPLANT
GAUZE XEROFORM 5X9 LF (GAUZE/BANDAGES/DRESSINGS) ×2 IMPLANT
GLOVE BIOGEL PI IND STRL 8 (GLOVE) ×2 IMPLANT
GLOVE BIOGEL PI INDICATOR 8 (GLOVE) ×2
GLOVE ECLIPSE 7.5 STRL STRAW (GLOVE) ×4 IMPLANT
GOWN PREVENTION PLUS LG XLONG (DISPOSABLE) IMPLANT
GOWN STRL NON-REIN LRG LVL3 (GOWN DISPOSABLE) ×2 IMPLANT
GOWN STRL REIN XL XLG (GOWN DISPOSABLE) ×4 IMPLANT
HANDPIECE INTERPULSE COAX TIP (DISPOSABLE) ×2
HOOD PEEL AWAY FACE SHEILD DIS (HOOD) ×6 IMPLANT
IMMOBILIZER KNEE 20 (SOFTGOODS)
IMMOBILIZER KNEE 20 THIGH 36 (SOFTGOODS) IMPLANT
IMMOBILIZER KNEE 22 UNIV (SOFTGOODS) ×2 IMPLANT
KIT BASIN OR (CUSTOM PROCEDURE TRAY) ×2 IMPLANT
KIT ROOM TURNOVER OR (KITS) ×2 IMPLANT
MANIFOLD NEPTUNE II (INSTRUMENTS) ×2 IMPLANT
NDL HYPO 25GX1X1/2 BEV (NEEDLE) IMPLANT
NEEDLE HYPO 25GX1X1/2 BEV (NEEDLE) IMPLANT
NS IRRIG 1000ML POUR BTL (IV SOLUTION) ×2 IMPLANT
PACK TOTAL JOINT (CUSTOM PROCEDURE TRAY) ×2 IMPLANT
PAD ARMBOARD 7.5X6 YLW CONV (MISCELLANEOUS) ×4 IMPLANT
PAD CAST 4YDX4 CTTN HI CHSV (CAST SUPPLIES) ×1 IMPLANT
PADDING CAST COTTON 4X4 STRL (CAST SUPPLIES) ×2
PADDING CAST COTTON 6X4 STRL (CAST SUPPLIES) ×1 IMPLANT
SET HNDPC FAN SPRY TIP SCT (DISPOSABLE) ×1 IMPLANT
SPONGE GAUZE 4X4 12PLY (GAUZE/BANDAGES/DRESSINGS) ×2 IMPLANT
STAPLER VISISTAT 35W (STAPLE) IMPLANT
STRIP CLOSURE SKIN 1/2X4 (GAUZE/BANDAGES/DRESSINGS) ×2 IMPLANT
SUCTION FRAZIER TIP 10 FR DISP (SUCTIONS) ×2 IMPLANT
SUT MNCRL AB 3-0 PS2 18 (SUTURE) IMPLANT
SUT VIC AB 0 CTB1 27 (SUTURE) ×4 IMPLANT
SUT VIC AB 1 CT1 27 (SUTURE) ×4
SUT VIC AB 1 CT1 27XBRD ANBCTR (SUTURE) ×2 IMPLANT
SUT VIC AB 2-0 CTB1 (SUTURE) ×4 IMPLANT
SYR CONTROL 10ML LL (SYRINGE) IMPLANT
TOWEL OR 17X24 6PK STRL BLUE (TOWEL DISPOSABLE) ×2 IMPLANT
TOWEL OR 17X26 10 PK STRL BLUE (TOWEL DISPOSABLE) ×2 IMPLANT
TRAY FOLEY CATH 16FRSI W/METER (SET/KITS/TRAYS/PACK) ×2 IMPLANT
WATER STERILE IRR 1000ML POUR (IV SOLUTION) ×4 IMPLANT

## 2013-04-01 NOTE — Brief Op Note (Signed)
04/01/2013  2:24 PM  PATIENT:  Terry Shannon  57 y.o. female  PRE-OPERATIVE DIAGNOSIS:  degenerative joint disease right knee  POST-OPERATIVE DIAGNOSIS:  degenerative joint disease right knee  PROCEDURE:  Procedure(s): RIGHT TOTAL KNEE ARTHROPLASTY (Right)  SURGEON:  Surgeon(s) and Role:    * Harvie Junior, MD - Primary  PHYSICIAN ASSISTANT:   ASSISTANTS: bethune   ANESTHESIA:   general  EBL:  Total I/O In: 1000 [I.V.:1000] Out: 400 [Urine:200; Blood:200]  BLOOD ADMINISTERED:none  DRAINS: (1) Hemovact drain(s) in the r knee with  Suction Open   LOCAL MEDICATIONS USED:  experel 20cc     SPECIMEN:  No Specimen  DISPOSITION OF SPECIMEN:  N/A  COUNTS:  YES  TOURNIQUET:   Total Tourniquet Time Documented: Thigh (Right) - 51 minutes Total: Thigh (Right) - 51 minutes   DICTATION: .Other Dictation: Dictation Number [id number]  PLAN OF CARE: Admit to inpatient   PATIENT DISPOSITION:  PACU - hemodynamically stable.   Delay start of Pharmacological VTE agent (>24hrs) due to surgical blood loss or risk of bleeding: no

## 2013-04-01 NOTE — Transfer of Care (Signed)
Immediate Anesthesia Transfer of Care Note  Patient: Terry Shannon  Procedure(s) Performed: Procedure(s): RIGHT TOTAL KNEE ARTHROPLASTY (Right)  Patient Location: PACU  Anesthesia Type:General  Level of Consciousness: awake, oriented and patient cooperative  Airway & Oxygen Therapy: Patient Spontanous Breathing and Patient connected to nasal cannula oxygen  Post-op Assessment: Report given to PACU RN and Post -op Vital signs reviewed and stable  Post vital signs: Reviewed  Complications: No apparent anesthesia complications

## 2013-04-01 NOTE — Anesthesia Preprocedure Evaluation (Signed)
Anesthesia Evaluation  Patient identified by MRN, date of birth, ID band Patient awake    Reviewed: Allergy & Precautions, H&P , NPO status , Patient's Chart, lab work & pertinent test results  History of Anesthesia Complications (+) history of anesthetic complications  Airway Mallampati: I  Neck ROM: Full    Dental   Pulmonary  breath sounds clear to auscultation        Cardiovascular Rate:Normal     Neuro/Psych  Headaches,    GI/Hepatic PUD,   Endo/Other  Hypothyroidism   Renal/GU      Musculoskeletal  (+) Arthritis -, Rheumatoid disorders,    Abdominal (+) + obese,   Peds  Hematology   Anesthesia Other Findings   Reproductive/Obstetrics                           Anesthesia Physical Anesthesia Plan  ASA: II  Anesthesia Plan: General   Post-op Pain Management:    Induction: Intravenous  Airway Management Planned: Oral ETT  Additional Equipment:   Intra-op Plan:   Post-operative Plan: Extubation in OR  Informed Consent: I have reviewed the patients History and Physical, chart, labs and discussed the procedure including the risks, benefits and alternatives for the proposed anesthesia with the patient or authorized representative who has indicated his/her understanding and acceptance.   Dental advisory given  Plan Discussed with: CRNA and Surgeon  Anesthesia Plan Comments:         Anesthesia Quick Evaluation

## 2013-04-01 NOTE — Anesthesia Procedure Notes (Addendum)
Anesthesia Regional Block:  Femoral nerve block  Pre-Anesthetic Checklist: ,, timeout performed, Correct Patient, Correct Site, Correct Laterality, Correct Procedure, Correct Position, site marked, Risks and benefits discussed, at surgeon's request and post-op pain management  Laterality: Right and Upper  Prep: chloraprep       Needles:  Injection technique: Single-shot  Needle Type: Stimulator Needle - 80      Needle Gauge: 22 and 22 G  Needle insertion depth: 6 cm   Additional Needles:  Procedures: ultrasound guided (picture in chart) and nerve stimulator Femoral nerve block  Nerve Stimulator or Paresthesia:  Response: Twitch elicited, 0.8 mA,   Additional Responses:   Narrative:  Start time: 04/01/2013 12:10 PM End time: 04/01/2013 12:25 PM Injection made incrementally with aspirations every 5 mL.  Performed by: Personally  Anesthesiologist: Alma Friendly, MD  Additional Notes: BP cuff, EKG monitors applied. Sedation begun. Femoral artery palpated for location of nerve. After nerve location anesthetic injected incrementally, slowly , and after neg aspirations. Tolerated well.  Femoral nerve block Procedure Name: LMA Insertion Date/Time: 04/01/2013 12:49 PM Performed by: Margaree Mackintosh Pre-anesthesia Checklist: Patient identified, Timeout performed, Emergency Drugs available, Suction available and Patient being monitored Patient Re-evaluated:Patient Re-evaluated prior to inductionOxygen Delivery Method: Circle system utilized Preoxygenation: Pre-oxygenation with 100% oxygen Intubation Type: IV induction Ventilation: Mask ventilation without difficulty LMA: LMA inserted LMA Size: 4.0 Number of attempts: 1 Placement Confirmation: positive ETCO2 and breath sounds checked- equal and bilateral Tube secured with: Tape Dental Injury: Teeth and Oropharynx as per pre-operative assessment     Procedure Name: Intubation Date/Time: 04/01/2013 1:25 PM Performed by:  Margaree Mackintosh Pre-anesthesia Checklist: Patient identified, Timeout performed, Emergency Drugs available, Suction available and Patient being monitored Patient Re-evaluated:Patient Re-evaluated prior to inductionOxygen Delivery Method: Circle system utilized Preoxygenation: Pre-oxygenation with 100% oxygen Ventilation: Mask ventilation without difficulty Laryngoscope Size: Mac and 3 Grade View: Grade I Tube type: Oral Tube size: 7.5 mm Number of attempts: 1 Airway Equipment and Method: Stylet Placement Confirmation: ETT inserted through vocal cords under direct vision,  positive ETCO2 and breath sounds checked- equal and bilateral Secured at: 21 cm Tube secured with: Tape Dental Injury: Teeth and Oropharynx as per pre-operative assessment

## 2013-04-01 NOTE — Progress Notes (Signed)
Orthopedic Tech Progress Note Patient Details:  Terry Shannon [birth year] [id number]  CPM Left Knee CPM Left Knee: On Left Knee Flexion (Degrees): 60 Left Knee Extension (Degrees): 0 Additional Comments: TrAPEZE BAR    Shawnie Pons 04/01/2013, 3:47 PM

## 2013-04-01 NOTE — Anesthesia Postprocedure Evaluation (Signed)
Anesthesia Post Note  Patient: Terry Shannon  Procedure(s) Performed: Procedure(s) (LRB): RIGHT TOTAL KNEE ARTHROPLASTY (Right)  Anesthesia type: general  Patient location: PACU  Post pain: Pain level controlled  Post assessment: Patient's Cardiovascular Status Stable  Last Vitals:  Filed Vitals:   04/01/13 1525  BP: 137/80  Pulse: 89  Temp:   Resp: 11    Post vital signs: Reviewed and stable  Level of consciousness: sedated  Complications: No apparent anesthesia complications

## 2013-04-01 NOTE — H&P (Signed)
TOTAL KNEE ADMISSION H&P  Patient is being admitted for right total knee arthroplasty.  Subjective:  Chief Complaint:right knee pain.  HPI: Terry Shannon, 57 y.o. female, has a history of pain and functional disability in the right knee due to arthritis and has failed non-surgical conservative treatments for greater than 12 weeks to includeNSAID's and/or analgesics, corticosteriod injections, viscosupplementation injections, flexibility and strengthening excercises, use of assistive devices and activity modification.  Onset of symptoms was gradual, starting 3 years ago with gradually worsening course since that time. The patient noted prior procedures on the knee to include  arthroscopy and menisectomy on the right knee(s).  Patient currently rates pain in the right knee(s) at 8 out of 10 with activity. Patient has night pain, worsening of pain with activity and weight bearing, pain that interferes with activities of daily living, pain with passive range of motion, crepitus and joint swelling.  Patient has evidence of subchondral cysts and joint space narrowing by imaging studies. This patient has had failure of conservative care .Marland Kitchen There is no active infection.  Patient Active Problem List   Diagnosis Date Noted  . OA (osteoarthritis) 11/01/2011  . OAB (overactive bladder)   . Ulcerative colitis   . Cancer   . Carpal tunnel syndrome   . RA (rheumatoid arthritis)    Past Medical History  Diagnosis Date  . Headache(784.0)   . OAB (overactive bladder)   . Ulcerative colitis   . Cancer 2002    thyroid  =  thyroidectomy  . Fibroid   . Carpal tunnel syndrome   . RA (rheumatoid arthritis)   . Allergy   . Complication of anesthesia     after gallbladder surgery slow to wake up  . Hypothyroidism   . Sleep apnea     CPAP at home, but doesn't use it     Past Surgical History  Procedure Laterality Date  . Hand surgery    . Umbilical hernia repair    . Thyroidectomy    .  Cholecystectomy    . Hernia repair    . Foot surgery  may 2014    for hammer toe    Facility-administered medications prior to admission  Medication Dose Route Frequency Provider Last Rate Last Dose  . valproate (DEPACON) 1,000 mg in sodium chloride 0.9 % 100 mL IVPB  1,000 mg Intravenous Continuous Micki Riley, MD   1,000 mg at 08/08/12 1053   Prescriptions prior to admission  Medication Sig Dispense Refill  . Calcium Carbonate-Vit D-Min (CALCIUM 600 + MINERALS PO) Take 2 tablets by mouth 2 (two) times daily.      . Cholecalciferol (VITAMIN D3) 2000 UNITS capsule Take 2,000 Units by mouth daily.      . cyclobenzaprine (FLEXERIL) 10 MG tablet Take 10 mg by mouth 2 (two) times daily as needed for muscle spasms.      Marland Kitchen desvenlafaxine (PRISTIQ) 100 MG 24 hr tablet Take 100 mg by mouth every morning.      Marland Kitchen esomeprazole (NEXIUM) 40 MG packet Take 40 mg by mouth daily before breakfast.      . mesalamine (LIALDA) 1.2 G EC tablet Take 1,200 mg by mouth 2 (two) times daily.      . Omega-3 Fatty Acids (FISH OIL) 1000 MG CAPS Take 3,000 mg by mouth daily.      Marland Kitchen OVER THE COUNTER MEDICATION 2 tablets.      Marland Kitchen OVER THE COUNTER MEDICATION Take 1 tablet by mouth 2 (two) times  daily as needed (for pain). *otc product, Tumeric      . oxybutynin (DITROPAN-XL) 10 MG 24 hr tablet Take 10 mg by mouth daily.      Marland Kitchen oxybutynin (DITROPAN-XL) 10 MG 24 hr tablet Take 10 mg by mouth at bedtime.      . pregabalin (LYRICA) 50 MG capsule Take 50 mg by mouth 2 (two) times daily.      Marland Kitchen thyroid (ARMOUR) 90 MG tablet Take 90 mg by mouth every morning.      . zolpidem (AMBIEN CR) 12.5 MG CR tablet Take 12.5 mg by mouth at bedtime.       Allergies  Allergen Reactions  . Iodinated Diagnostic Agents Hives and Swelling    Hives 1982 during ivp, ok w/ 13 hr prep//a.c.  . Codeine Nausea And Vomiting  . Humira [Adalimumab] Other (See Comments)    Pt doesn't remember this as an allergy.  . Hydrocodone Other (See Comments)     Gives migraines  . Ibuprofen Other (See Comments)    Gives migraines  . Imuran [Azathioprine] Other (See Comments)    Pt doesn't remember this as an allergy.  . Methotrexate Derivatives Other (See Comments)    Pt doesn't remember this as an allergy.  . Oxycodone Other (See Comments)    Migraine headache  . Sulfa Antibiotics Other (See Comments)    Pt doesn't remember her reaction.  . Tylenol [Acetaminophen] Other (See Comments)    Gives migraines    History  Substance Use Topics  . Smoking status: Never Smoker   . Smokeless tobacco: Never Used  . Alcohol Use: Yes     Comment: social    Family History  Problem Relation Age of Onset  . Stroke Mother   . Cancer Father 109    lung  . Cancer Maternal Grandmother 80    colon  . Stroke Maternal Grandmother   . Diabetes Brother   . Stroke Maternal Grandfather   . Diabetes Paternal Grandmother      ROS ROS: I have reviewed the patient's review of systems thoroughly and there are no positive responses as relates to the HPI. Objective:  Physical Exam  Vital signs in last 24 hours: Temp:  [97.4 F (36.3 C)] 97.4 F (36.3 C) (11/10 1047) Pulse Rate:  [73] 73 (11/10 1047) Resp:  [15-20] 15 (11/10 1158) BP: (164)/(89-93) 164/89 mmHg (11/10 1158) SpO2:  [96 %-100 %] 100 % (11/10 1158) Well-developed well-nourished patient in no acute distress. Alert and oriented x3 HEENT:within normal limits Cardiac: Regular rate and rhythm Pulmonary: Lungs clear to auscultation Abdomen: Soft and nontender.  Normal active bowel sounds  Musculoskeletal: (painful range of motion right knee with tenderness to palpation over the medial joint line.  No instability.  Well-healed arthroscopy wounds. Labs: Recent Results (from the past 2160 hour(s))  WOUND CULTURE     Status: None   Collection Time    01/24/13  8:43 PM      Result Value Range   Culture Abundant STAPHYLOCOCCUS AUREUS     Comment: TOE   Gram Stain Rare     Gram Stain WBC  present-both PMN and Mononuclear     Gram Stain No Squamous Epithelial Cells Seen     Gram Stain Abundant Gram Positive Cocci In Clusters     Organism ID, Bacteria STAPHYLOCOCCUS AUREUS     Comment: Rifampin and Gentamicin should not be used as     single drugs for treatment of Staph infections.  POCT  CBC     Status: None   Collection Time    01/24/13  8:43 PM      Result Value Range   WBC 9.6  4.6 - 10.2 K/uL   Lymph, poc 2.1  0.6 - 3.4   POC LYMPH PERCENT 22.3  10 - 50 %L   MID (cbc) 0.8  0 - 0.9   POC MID % 8.5  0 - 12 %M   POC Granulocyte 6.6  2 - 6.9   Granulocyte percent 69.2  37 - 80 %G   RBC 4.64  4.04 - 5.48 M/uL   Hemoglobin 14.2  12.2 - 16.2 g/dL   HCT, POC 16.1  09.6 - 47.9 %   MCV 95.5  80 - 97 fL   MCH, POC 30.6  27 - 31.2 pg   MCHC 32.1  31.8 - 35.4 g/dL   RDW, POC 04.5     Platelet Count, POC 297  142 - 424 K/uL   MPV 9.4  0 - 99.8 fL  SEDIMENTATION RATE     Status: None   Collection Time    01/24/13  8:43 PM      Result Value Range   Sed Rate 8  0 - 22 mm/hr  SURGICAL PCR SCREEN     Status: Abnormal   Collection Time    03/22/13 12:52 PM      Result Value Range   MRSA, PCR NEGATIVE  NEGATIVE   Staphylococcus aureus POSITIVE (*) NEGATIVE   Comment:            The Xpert SA Assay (FDA     approved for NASAL specimens     in patients over 98 years of age),     is one component of     a comprehensive surveillance     program.  Test performance has     been validated by [employer] for patients greater     than or equal to 63 year old.     It is not intended     to diagnose infection nor to     guide or monitor treatment.  URINALYSIS, ROUTINE W REFLEX MICROSCOPIC     Status: None   Collection Time    03/22/13 12:53 PM      Result Value Range   Color, Urine YELLOW  YELLOW   APPearance CLEAR  CLEAR   Specific Gravity, Urine 1.008  1.005 - 1.030   pH 7.0  5.0 - 8.0   Glucose, UA NEGATIVE  NEGATIVE mg/dL   Hgb urine dipstick NEGATIVE  NEGATIVE    Bilirubin Urine NEGATIVE  NEGATIVE   Ketones, ur NEGATIVE  NEGATIVE mg/dL   Protein, ur NEGATIVE  NEGATIVE mg/dL   Urobilinogen, UA 0.2  0.0 - 1.0 mg/dL   Nitrite NEGATIVE  NEGATIVE   Leukocytes, UA NEGATIVE  NEGATIVE   Comment: MICROSCOPIC NOT DONE ON URINES WITH NEGATIVE PROTEIN, BLOOD, LEUKOCYTES, NITRITE, OR GLUCOSE <1000 mg/dL.  APTT     Status: None   Collection Time    03/22/13 12:59 PM      Result Value Range   aPTT 32  24 - 37 seconds  CBC WITH DIFFERENTIAL     Status: Abnormal   Collection Time    03/22/13 12:59 PM      Result Value Range   WBC 6.3  4.0 - 10.5 K/uL   RBC 4.61  3.87 - 5.11 MIL/uL   Hemoglobin 14.2  12.0 -  15.0 g/dL   HCT 73.2  20.2 - 54.2 %   MCV 89.2  78.0 - 100.0 fL   MCH 30.8  26.0 - 34.0 pg   MCHC 34.5  30.0 - 36.0 g/dL   RDW 70.6  23.7 - 62.8 %   Platelets 279  150 - 400 K/uL   Neutrophils Relative % 54  43 - 77 %   Neutro Abs 3.4  1.7 - 7.7 K/uL   Lymphocytes Relative 29  12 - 46 %   Lymphs Abs 1.8  0.7 - 4.0 K/uL   Monocytes Relative 13 (*) 3 - 12 %   Monocytes Absolute 0.8  0.1 - 1.0 K/uL   Eosinophils Relative 4  0 - 5 %   Eosinophils Absolute 0.3  0.0 - 0.7 K/uL   Basophils Relative 1  0 - 1 %   Basophils Absolute 0.0  0.0 - 0.1 K/uL  COMPREHENSIVE METABOLIC PANEL     Status: Abnormal   Collection Time    03/22/13 12:59 PM      Result Value Range   Sodium 142  135 - 145 mEq/L   Potassium 3.9  3.5 - 5.1 mEq/L   Chloride 104  96 - 112 mEq/L   CO2 28  19 - 32 mEq/L   Glucose, Bld 80  70 - 99 mg/dL   BUN 14  6 - 23 mg/dL   Creatinine, Ser 3.15  0.50 - 1.10 mg/dL   Calcium 9.3  8.4 - 17.6 mg/dL   Total Protein 6.8  6.0 - 8.3 g/dL   Albumin 3.7  3.5 - 5.2 g/dL   AST 23  0 - 37 U/L   ALT 33  0 - 35 U/L   Alkaline Phosphatase 81  39 - 117 U/L   Total Bilirubin 0.2 (*) 0.3 - 1.2 mg/dL   GFR calc non Af Amer >90  >90 mL/min   GFR calc Af Amer >90  >90 mL/min   Comment: (NOTE)     The eGFR has been calculated using the CKD EPI equation.      This calculation has not been validated in all clinical situations.     eGFR's persistently <90 mL/min signify possible Chronic Kidney     Disease.  PROTIME-INR     Status: None   Collection Time    03/22/13 12:59 PM      Result Value Range   Prothrombin Time 11.8  11.6 - 15.2 seconds   INR 0.88  0.00 - 1.49  TYPE AND SCREEN     Status: None   Collection Time    03/22/13  1:00 PM      Result Value Range   ABO/RH(D) O POS     Antibody Screen NEG     Sample Expiration 04/05/2013    ABO/RH     Status: None   Collection Time    03/22/13  1:00 PM      Result Value Range   ABO/RH(D) O POS      Estimated body mass index is 27.64 kg/(m^2) as calculated from the following:   Height as of 03/22/13: 5' 6.5" (1.689 m).   Weight as of 01/24/13: 78.835 kg (173 lb 12.8 oz).   Imaging Review Plain radiographs demonstrate moderate degenerative joint disease of the right knee(s). The overall alignment ismild varus. The bone quality appears to be fair for age and reported activity level.  Assessment/Plan:  End stage arthritis, right knee   The patient history, physical examination,  clinical judgment of the provider and imaging studies are consistent with end stage degenerative joint disease of the right knee(s) and total knee arthroplasty is deemed medically necessary. The treatment options including medical management, injection therapy arthroscopy and arthroplasty were discussed at length. The risks and benefits of total knee arthroplasty were presented and reviewed. The risks due to aseptic loosening, infection, stiffness, patella tracking problems, thromboembolic complications and other imponderables were discussed. The patient acknowledged the explanation, agreed to proceed with the plan and consent was signed. Patient is being admitted for inpatient treatment for surgery, pain control, PT, OT, prophylactic antibiotics, VTE prophylaxis, progressive ambulation and ADL's and discharge planning.  The patient is planning to be discharged home with home health services

## 2013-04-01 NOTE — Preoperative (Signed)
Beta Blockers   Reason not to administer Beta Blockers:Not Applicable 

## 2013-04-02 LAB — CBC
Hemoglobin: 12.1 g/dL (ref 12.0–15.0)
MCH: 30.9 pg (ref 26.0–34.0)
MCHC: 34.7 g/dL (ref 30.0–36.0)
MCV: 89.3 fL (ref 78.0–100.0)
Platelets: 267 10*3/uL (ref 150–400)
RBC: 3.91 MIL/uL (ref 3.87–5.11)
RDW: 13.7 % (ref 11.5–15.5)

## 2013-04-02 LAB — BASIC METABOLIC PANEL
BUN: 11 mg/dL (ref 6–23)
CO2: 22 mEq/L (ref 19–32)
Calcium: 8.9 mg/dL (ref 8.4–10.5)
Creatinine, Ser: 0.46 mg/dL — ABNORMAL LOW (ref 0.50–1.10)
GFR calc Af Amer: >90 mL/min (ref >90)
GFR calc non Af Amer: >90 mL/min (ref >90)
Glucose, Bld: 154 mg/dL — ABNORMAL HIGH (ref 70–99)
Potassium: 3.8 mEq/L (ref 3.5–5.1)
Sodium: 136 mEq/L (ref 135–145)

## 2013-04-02 MED ORDER — CYCLOBENZAPRINE HCL 10 MG PO TABS
10.0000 mg | ORAL_TABLET | Freq: Three times a day (TID) | ORAL | Status: DC | PRN
  Administered 2013-04-02 – 2013-04-03 (×3): 10 mg via ORAL
  Filled 2013-04-02 (×3): qty 1

## 2013-04-02 MED ORDER — SUMATRIPTAN SUCCINATE 6 MG/0.5ML ~~LOC~~ SOLN
6.0000 mg | SUBCUTANEOUS | Status: DC | PRN
  Filled 2013-04-02: qty 0.5

## 2013-04-02 NOTE — Care Management Utilization Note (Signed)
Utilization review completed. Manford Sprong, RN BSN 

## 2013-04-02 NOTE — Progress Notes (Signed)
Physical Therapy Treatment Patient Details Name: Terry Shannon MRN: [id number] DOB: 1956/04/22 Today's Date: 04/02/2013 Time: 1530-1600 PT Time Calculation (min): 30 min  PT Assessment / Plan / Recommendation  History of Present Illness 57 year old female s/p Rt. TKA. PMH includes overactive bladder, RA, OA, and thyroid cancer.   PT Comments   Pt progressing well. Did great with stair training, partner present to practice with pt.     Follow Up Recommendations  Home health PT;Supervision for mobility/OOB           Equipment Recommendations  3in1 (PT);Other (comment) (Pt has RW, needs shower chair)       Frequency 7X/week   Progress towards PT Goals Progress towards PT goals: Progressing toward goals  Plan   Continue with plan of care   Precautions / Restrictions Precautions Precautions: Fall Required Braces or Orthoses: Knee Immobilizer - Right Restrictions Weight Bearing Restrictions: No RLE Weight Bearing: Weight bearing as tolerated   Pertinent Vitals/Pain 4/10 Rt. Knee, no need for medication    Mobility  Bed Mobility Bed Mobility: Supine to Sit;Sit to Supine Supine to Sit: 5: Supervision;HOB flat Sit to Supine: 5: Supervision Details for Bed Mobility Assistance: Supervision cues for sequencing Transfers Transfers: Sit to Stand;Stand to Sit;Stand Pivot Transfers Sit to Stand: 5: Supervision;From bed;From chair/3-in-1 Stand to Sit: 5: Supervision Stand Pivot Transfers: 5: Supervision Details for Transfer Assistance: Min-guard progresing to supervision. Cues for UE placement for safety, Rt. LE placement for pain modulation Ambulation/Gait Gait Pattern:  (min foot clearance bil. ) Stairs: Yes Stairs Assistance: 4: Min guard;4: Min assist (stablization of RW) Stairs Assistance Details (indicate cue type and reason): Cues for sequencing. Practiced sideways with rail and backwards with RW, significant other holding RW. Pt prefers backwards with RW Stair Management  Technique: One rail Right;With walker;Backwards;Sideways Number of Stairs: 4    Exercises General Exercises - Lower Extremity Ankle Circles/Pumps: AROM;Both;20 reps;Seated Quad Sets: AROM;Right;10 reps;Supine Heel Slides: AAROM;Right;10 reps;Supine Hip ABduction/ADduction: AROM;Right;10 reps;Supine Straight Leg Raises: AROM;Right;10 reps;Supine     PT Goals (current goals can now be found in the care plan section) Acute Rehab PT Goals Patient Stated Goal: Increase mobility, go home  Visit Information  Last PT Received On: 04/02/13 Assistance Needed: +1 History of Present Illness: 57 year old female s/p Rt. TKA. PMH includes overactive bladder, RA, OA, and thyroid cancer.    Subjective Data  Patient Stated Goal: Increase mobility, go home   Cognition  Cognition Arousal/Alertness: Awake/alert Behavior During Therapy: WFL for tasks assessed/performed Overall Cognitive Status: Within Functional Limits for tasks assessed    Balance  Balance Balance Assessed: Yes Static Sitting Balance Static Sitting - Balance Support: No upper extremity supported;Feet supported Static Sitting - Comment/# of Minutes: independent Static Standing Balance Static Standing - Balance Support: Bilateral upper extremity supported;No upper extremity supported Static Standing - Level of Assistance: 5: Stand by assistance  End of Session PT - End of Session Equipment Utilized During Treatment: Gait belt Activity Tolerance: Patient tolerated treatment well Patient left: with call bell/phone within reach;in bed;with family/visitor present CPM Right Knee CPM Right Knee: On Right Knee Flexion (Degrees): 60   GP     Sherrine Maples Cheek 04/02/2013, 4:12 PM

## 2013-04-02 NOTE — Progress Notes (Signed)
04/02/13  Set up with HHPT with [facility] by MD office. Spoke with patient , no change in discharge plan.[employer] providing CPM and 3N1, patient has rolling walker at home. No other discharge needs identified. Jacquelynn Cree RN, BSN, CCM

## 2013-04-02 NOTE — Evaluation (Signed)
Physical Therapy Evaluation Patient Details Name: Terry Shannon MRN: [id number] DOB: 14-Apr-1956 Today's Date: 04/02/2013 Time: 7829-5621 PT Time Calculation (min): 33 min  PT Assessment / Plan / Recommendation History of Present Illness  57 year old female s/p Rt. TKA. PMH includes overactive bladder, RA, OA, and thyroid cancer.  Clinical Impression  Pt doing very well today however demonstrates decreased functional mobility compared to baseline. Currently min-guard/supervision level for ambulation. Pt will need to perform stairs prior to D/C home (7 steps to enter). Will follow pt acutely to address deficits listed below.     PT Assessment  Patient needs continued PT services    Follow Up Recommendations  Home health PT;Supervision for mobility/OOB       Barriers to Discharge  Stairs      Equipment Recommendations  3in1 (PT);Other (comment) (Pt has RW, needs shower chair)    Recommendations for Other Services   OT consult  Frequency 7X/week    Precautions / Restrictions Precautions Precautions: Fall Required Braces or Orthoses: Knee Immobilizer - Right Restrictions Weight Bearing Restrictions: No RLE Weight Bearing: Weight bearing as tolerated   Pertinent Vitals/Pain 4/10 Rt. knee pain medication requested from nursing      Mobility  Bed Mobility Bed Mobility: Supine to Sit Supine to Sit: 5: Supervision;HOB flat Details for Bed Mobility Assistance: Supervision cues for sequencing Transfers Transfers: Sit to Stand;Stand to Sit Sit to Stand: 5: Supervision;4: Min guard;From bed;From chair/3-in-1 Stand to Sit: 5: Supervision Details for Transfer Assistance: Min-guard progresing to supervision. Cues for UE placement for safety, Rt. LE placement for pain modulation Ambulation/Gait Ambulation/Gait Assistance: 4: Min guard Ambulation Distance (Feet): 15 Feet (45) Assistive device: Rolling walker Ambulation/Gait Assistance Details: Cues for step and RW sequencing.  Cues for Rt. quad set during stance phase on Rt. Excessive weight bearing through bil. UEs Gait Pattern: Step-to pattern;Decreased stance time - right;Trunk flexed (min foot clearance bil. ) Stairs: No    Exercises General Exercises - Lower Extremity Ankle Circles/Pumps: AROM;Both;20 reps;Seated   PT Diagnosis: Difficulty walking;Abnormality of gait;Generalized weakness;Acute pain  PT Problem List: Decreased strength;Decreased range of motion;Decreased activity tolerance;Decreased balance;Decreased mobility;Decreased knowledge of precautions;Decreased knowledge of use of DME;Pain PT Treatment Interventions: DME instruction;Gait training;Stair training;Functional mobility training;Therapeutic activities;Therapeutic exercise;Balance training;Neuromuscular re-education;Patient/family education     PT Goals(Current goals can be found in the care plan section) Acute Rehab PT Goals Patient Stated Goal: Increase mobility, go home PT Goal Formulation: With patient Time For Goal Achievement: 04/09/13 Potential to Achieve Goals: Good  Visit Information  Last PT Received On: 04/02/13 Assistance Needed: +1 History of Present Illness: 57 year old female s/p Rt. TKA. PMH includes overactive bladder, RA, OA, and thyroid cancer.       Prior Functioning  Home Living Family/patient expects to be discharged to:: Private residence Living Arrangements: Spouse/significant other;Children Available Help at Discharge: Family;Available 24 hours/day Type of Home: House Home Access: Stairs to enter [employer] of Steps: 7 Entrance Stairs-Rails: Right Home Layout: Two level;Able to live on main level with bedroom/bathroom Home Equipment: Dan Humphreys - 2 wheels;Cane - single point;Crutches Prior Function Level of Independence: Independent Comments: Pt was modified independent, able to walk without device but had difficulty with stairs. Also has arthritis in hands so buttons, etc are difficult for her.   Communication Communication: No difficulties Dominant Hand: Right    Cognition  Cognition Arousal/Alertness: Awake/alert Behavior During Therapy: WFL for tasks assessed/performed Overall Cognitive Status: Within Functional Limits for tasks assessed    Extremity/Trunk  Assessment Upper Extremity Assessment Upper Extremity Assessment: Overall WFL for tasks assessed Lower Extremity Assessment Lower Extremity Assessment: RLE deficits/detail;LLE deficits/detail RLE Deficits / Details: Generalized post-surgical weakness. Difficulty obtaining quad set but able to lift leg against gravity with little difficulty.  RLE: Unable to fully assess due to pain RLE Sensation:  (WFL) LLE Deficits / Details: [facility]   Balance Balance Balance Assessed: Yes Static Sitting Balance Static Sitting - Balance Support: No upper extremity supported;Feet supported Static Sitting - Comment/# of Minutes: modified independent Static Standing Balance Static Standing - Balance Support: Bilateral upper extremity supported;No upper extremity supported Static Standing - Level of Assistance: 5: Stand by assistance Static Standing - Comment/# of Minutes: Supervision for safety, no overt losses of balance.   End of Session PT - End of Session Equipment Utilized During Treatment: Gait belt Activity Tolerance: Patient tolerated treatment well Patient left: in chair;with call bell/phone within reach Nurse Communication: Mobility status;Patient requests pain meds CPM Right Knee CPM Right Knee: Off  GP     Sherrine Maples Cheek 04/02/2013, 9:03 AM

## 2013-04-02 NOTE — Progress Notes (Signed)
Subjective: 1 Day Post-Op Procedure(s) (LRB): RIGHT TOTAL KNEE ARTHROPLASTY (Right) Patient reports pain as moderate.    Objective: Vital signs in last 24 hours: Temp:  [97.3 F (36.3 C)-98.6 F (37 C)] 98.6 F (37 C) (11/11 4098) Pulse Rate:  [73-100] 100 (11/11 0614) Resp:  [6-20] 20 (11/11 0614) BP: (122-164)/(67-96) 125/68 mmHg (11/11 0614) SpO2:  [94 %-100 %] 98 % (11/11 0614)  Intake/Output from previous day: 11/10 0701 - 11/11 0700 In: 1900 [I.V.:1900] Out: 2615 [Urine:2150; Drains:265; Blood:200] Intake/Output this shift:     Recent Labs  04/02/13 0518  HGB 12.1    Recent Labs  04/02/13 0518  WBC 11.1*  RBC 3.91  HCT 34.9*  PLT 267    Recent Labs  04/02/13 0518  NA 136  K 3.8  CL 103  CO2 22  BUN 11  CREATININE 0.46*  GLUCOSE 154*  CALCIUM 8.9   No results found for this basename: LABPT, INR,  in the last 72 hours  Neurologically intact ABD soft Neurovascular intact Sensation intact distally Intact pulses distally No cellulitis present Compartment soft  Assessment/Plan: 1 Day Post-Op Procedure(s) (LRB): RIGHT TOTAL KNEE ARTHROPLASTY (Right) Advance diet Up with therapy Will add flexeril and imitrex for migraines  Sharion Grieves L 04/02/2013, 9:21 AM

## 2013-04-02 NOTE — Op Note (Signed)
NAMEMarland Kitchen  Terry Shannon, Terry Shannon NO.:  [id number]  MEDICAL RECORD NO.:  [id number]  LOCATION:  5N27C                        FACILITY:  [facility]  PHYSICIAN:  Harvie Junior, M.D.   DATE OF BIRTH:  02-11-1956  DATE OF PROCEDURE:  04/01/2013 DATE OF DISCHARGE:                              OPERATIVE REPORT   PREOPERATIVE DIAGNOSIS:  End-stage degenerative joint disease, right knee.  POSTOPERATIVE DIAGNOSIS:  End-stage degenerative joint disease, right knee.  PROCEDURE:  Right total knee replacement with a Sigma system size 3 femur, size 2.5 tibia, 10 mm bridging bearing, and a 35 mm all polyethylene patella.  SURGEON:  Harvie Junior, M.D.  ASSISTANT:  Marshia Ly, P.A.  ANESTHESIA:  General.  BRIEF HISTORY:  Ms. Witherow is a 57 year old female with a history of having had significant problems with the right knee.  She is a long-term rheumatoid arthritic.  She has had arthroscopy of this knee where we had a grade 4 change in the patellofemoral joint, some grade 4 change in the lateral compartment and some grade 4 change medially as well.  She had failed conservative care because of continued complaints of unrelenting pain in the knee, so she was taken to the operating room for total knee replacement.  DESCRIPTION OF PROCEDURE:  The patient was taken to the operating room. After adequate anesthesia was obtained with general anesthetic, the patient was placed supine on the operating table.  The right knee was then prepped and draped in usual sterile fashion.  Following this, the leg was exsanguinated and blood pressure tourniquet was inflated to 350 mmHg.  Following this, a midline incision was made to the subcutaneous tissue dissected down to the level of the extensor mechanism and medial parapatellar arthrotomy was undertaken.  Once this was done, attention turned to the knee with the medial and lateral meniscus, removed retropatellar fat pads, synovium in the  anterior aspect of the femur and anterior and posterior cruciate.  The tibia was then exposed and cut perpendicular to its long axis with an extramedullary tibial guide. Attention turned to the femur where intramedullary guide was used and intramedullary alignment jig was used with a 5-degree valgus cut, and the femur was then cut distally.  Attention then turned towards placing a spacer block, and a 10 spacer block fit nicely at this point. Attention was then turned to the femur sized to a 3, and anterior and posterior cuts were made, chamfers and box, attention then turned towards the tibia which was sized to a 2-1/2, and it was drilled and keeled.  At this point, trial tibia and trial femur were placed with a 10 mm bridging bearing excellent full extension, excellent stability in flexion.  Attention turned to the patella, cut down to the level of 13 mm and a 35 paddle was chosen and lugs were drilled.  The lugs drilled in the femur.  Attention then turned towards removal of all trial components.  At this point, the knee was copiously and thoroughly lavaged with pulsatile lavage irrigation, suctioned, dried.  The final components were then cemented into place, size 3 femur, size 2.5 tibia, 10 mm bridging bearing trial was placed and a  35 all poly patella held with a clamp.  All excess bone cement was removed and the cement was allowed to hardened at this point.  Once this was completed, attention was turned towards the towards letting the tourniquet down and all bleeding controlled with electrocautery.  The 20 mL of Exparel mixed with 40 mL of saline, was then mixed up and this was injected in the posterior aspect of the knee, and was also injected medial and lateral around the incisional site.  Once this was completed, attention was turned towards closure of the knee wire, towards placing the final polyethylene which was placed.  Excellent stability and range of motion was achieved  at this point.  The medial parapatellar arthrotomy was closed with a 1 Vicryl running, skin with #2-0 Vicryl and 3-0 Monocryl subcuticular. Benzoin and Steri-Strips were applied.  Dry sterile compressive dressing was applied, and the patient was taken to the recovery room in satisfactory condition.  The estimated blood loss for the procedure was none.     Harvie Junior, M.D.     Ranae Plumber  D:  04/01/2013  T:  04/02/2013  Job:  [id number]

## 2013-04-03 ENCOUNTER — Encounter (HOSPITAL_COMMUNITY): Payer: Self-pay | Admitting: Orthopedic Surgery

## 2013-04-03 LAB — CBC
Hemoglobin: 11 g/dL — ABNORMAL LOW (ref 12.0–15.0)
MCH: 30.4 pg (ref 26.0–34.0)
MCV: 90.3 fL (ref 78.0–100.0)
Platelets: 261 10*3/uL (ref 150–400)
RBC: 3.62 MIL/uL — ABNORMAL LOW (ref 3.87–5.11)
WBC: 13.1 10*3/uL — ABNORMAL HIGH (ref 4.0–10.5)

## 2013-04-03 MED ORDER — ASPIRIN 325 MG PO TBEC: 325.0000 mg | DELAYED_RELEASE_TABLET | Freq: Two times a day (BID) | ORAL | Status: DC

## 2013-04-03 NOTE — Progress Notes (Signed)
Subjective: 2 Days Post-Op Procedure(s) (LRB): RIGHT TOTAL KNEE ARTHROPLASTY (Right) Patient reports pain as 4 on 0-10 scale.   Good progress with PT Objective: Vital signs in last 24 hours: Temp:  [98.2 F (36.8 C)-99.2 F (37.3 C)] 98.7 F (37.1 C) (11/12 0641) Pulse Rate:  [75-111] 75 (11/12 0641) Resp:  [18] 18 (11/12 0641) BP: (146-164)/(71-84) 146/83 mmHg (11/12 0641) SpO2:  [99 %-100 %] 99 % (11/12 0641)  Intake/Output from previous day: 11/11 0701 - 11/12 0700 In: 1440 [P.O.:1440] Out: -  Intake/Output this shift:     Recent Labs  04/02/13 0518 04/03/13 0501  HGB 12.1 11.0*    Recent Labs  04/02/13 0518 04/03/13 0501  WBC 11.1* 13.1*  RBC 3.91 3.62*  HCT 34.9* 32.7*  PLT 267 261    Recent Labs  04/02/13 0518  NA 136  K 3.8  CL 103  CO2 22  BUN 11  CREATININE 0.46*  GLUCOSE 154*  CALCIUM 8.9   Righrt knee exam: Neurovascular intact Sensation intact distally Intact pulses distally Dorsiflexion/Plantar flexion intact Incision: no drainage Compartment soft  Assessment/Plan: 2 Days Post-Op Procedure(s) (LRB): RIGHT TOTAL KNEE ARTHROPLASTY (Right) Plan: Dressing changed by nursing Discharge home with home health  Terry Shannon 04/03/2013, 8:24 AM

## 2013-04-03 NOTE — Progress Notes (Signed)
Physical Therapy Treatment Patient Details Name: Terry Shannon MRN: [id number] DOB: 1955/09/07 Today's Date: 04/03/2013 Time: 7829-5621 PT Time Calculation (min): 25 min  PT Assessment / Plan / Recommendation  History of Present Illness 57 year old female s/p Rt. TKA. PMH includes overactive bladder, RA, OA, and thyroid cancer.   PT Comments   Pt moving very well.  Performed LE there-ex without any cueing nor physical (A).  Practiced steps again per pt's request in which she was able to return safe demonstration without needing any cues.  Pt safe to d/c home from mobility standpoint.      Follow Up Recommendations  Home health PT;Supervision for mobility/OOB     Does the patient have the potential to tolerate intense rehabilitation     Barriers to Discharge        Equipment Recommendations  3in1 (PT);Other (comment)    Recommendations for Other Services    Frequency 7X/week   Progress towards PT Goals Progress towards PT goals: Progressing toward goals  Plan Current plan remains appropriate    Precautions / Restrictions Precautions Precautions: Fall Restrictions RLE Weight Bearing: Weight bearing as tolerated   Pertinent Vitals/Pain 5/10 Rt knee.  Repositioned for comfort.  RN notified for pain medication & administered.      Mobility  Bed Mobility Bed Mobility: Supine to Sit;Sitting - Scoot to Edge of Bed;Sit to Supine Supine to Sit: 6: Modified independent (Device/Increase time);HOB flat Sitting - Scoot to Edge of Bed: 6: Modified independent (Device/Increase time) Sit to Supine: 6: Modified independent (Device/Increase time);HOB flat Transfers Transfers: Sit to Stand;Stand to Sit Sit to Stand: 6: Modified independent (Device/Increase time);With upper extremity assist;From bed Stand to Sit: 6: Modified independent (Device/Increase time);With upper extremity assist;To bed Ambulation/Gait Ambulation/Gait Assistance: 5: Supervision Ambulation Distance (Feet): 300  Feet Assistive device: Rolling walker Ambulation/Gait Assistance Details: Pt beginning to emerge into step-through gait pattern.   Gait Pattern: Step-to pattern;Step-through pattern;Decreased stride length;Decreased step length - left Stairs: Yes Stairs Assistance: 4: Min guard;4: Min assist Stairs Assistance Details (indicate cue type and reason): Guarding for safety with sideways technique; Min (A) for RW stabilization with backwards technique.  Pt able to return demonstration without cueing for sequencing.   Stair Management Technique: One rail Right;Sideways;No rails;Backwards;With walker Number of Stairs: 2 (2x's) Wheelchair Mobility Wheelchair Mobility: No    Exercises Total Joint Exercises Ankle Circles/Pumps: AROM;Both;10 reps Quad Sets: AROM;Both;10 reps Heel Slides: AROM;Strengthening;Right;10 reps Straight Leg Raises: AROM;Strengthening;Right;10 reps Long Arc Quad: AROM;Strengthening;Right;10 reps Knee Flexion: AAROM;Right;10 reps;Seated (self AAROM)     PT Goals (current goals can now be found in the care plan section) Acute Rehab PT Goals PT Goal Formulation: With patient Time For Goal Achievement: 04/09/13 Potential to Achieve Goals: Good  Visit Information  Last PT Received On: 04/03/13 Assistance Needed: +1 History of Present Illness: 57 year old female s/p Rt. TKA. PMH includes overactive bladder, RA, OA, and thyroid cancer.    Subjective Data      Cognition  Cognition Arousal/Alertness: Awake/alert Behavior During Therapy: WFL for tasks assessed/performed Overall Cognitive Status: Within Functional Limits for tasks assessed    Balance     End of Session PT - End of Session Activity Tolerance: Patient tolerated treatment well Patient left: in bed;in CPM Nurse Communication: Mobility status   GP     Lara Mulch 04/03/2013, 9:31 AM  Verdell Face, PTA [phone] 04/03/2013

## 2013-04-05 NOTE — Discharge Summary (Signed)
Patient ID: Terry Shannon MRN: [id number] DOB/AGE: 59-Jun-1957 59 y.o.  Admit date: 04/01/2013 Discharge date: 04/03/2013 Admission Diagnoses:  Principal Problem:   Osteoarthritis of right knee Active Problems:   RA (rheumatoid arthritis)   Discharge Diagnoses:  Same  Past Medical History  Diagnosis Date  . OAB (overactive bladder)   . Ulcerative colitis   . Fibroid   . Carpal tunnel syndrome   . Allergy   . Complication of anesthesia     after gallbladder surgery slow to wake up  . Hypothyroidism   . Pneumonia ~ 2004  . Chronic bronchitis     "probably q other year" (04/01/2013)  . Sleep apnea     CPAP at home, but doesn't use it  (04/01/2013)  . GERD (gastroesophageal reflux disease)   . Migraine     "sometimes qd for 1 wk; next time maybe not have one for 1 month or so" (04/01/2013)  . RA (rheumatoid arthritis)   . Osteoarthritis     "all over my body" (04/01/2013)  . Chronic lower back pain   . Depression   . Thyroid cancer 2002    Surgeries: Procedure(s): RIGHT TOTAL KNEE ARTHROPLASTY on 04/01/2013   Discharged Condition: Improved  Hospital Course: Terry Shannon is an 57 y.o. female who was admitted 04/01/2013 for operative treatment ofOsteoarthritis of right knee. Patient has severe unremitting pain that affects sleep, daily activities, and work/hobbies. After pre-op clearance the patient was taken to the operating room on 04/01/2013 and underwent  Procedure(s): RIGHT TOTAL KNEE ARTHROPLASTY.    Patient was given perioperative antibiotics: Anti-infectives   Start     Dose/Rate Route Frequency Ordered Stop   04/01/13 1900  ceFAZolin (ANCEF) IVPB 2 g/50 mL premix     2 g 100 mL/hr over 30 Minutes Intravenous Every 6 hours 04/01/13 1738 04/02/13 0131   04/01/13 1311  cefUROXime (ZINACEF) injection  Status:  Discontinued       As needed 04/01/13 1312 04/01/13 1454   04/01/13 0600  ceFAZolin (ANCEF) IVPB 2 g/50 mL premix     2 g 100 mL/hr over 30 Minutes  Intravenous On call to O.R. 03/31/13 1306 04/01/13 1252       Patient was given sequential compression devices, early ambulation, and chemoprophylaxis to prevent DVT.  Patient benefited maximally from hospital stay and there were no complications.    Recent vital signs: see chart   Recent laboratory studies:  Recent Labs  04/03/13 0501  WBC 13.1*  HGB 11.0*  HCT 32.7*  PLT 261     Discharge Medications:     Medication List         aspirin 325 MG EC tablet  Take 1 tablet (325 mg total) by mouth 2 (two) times daily after a meal.     CALCIUM 600 + MINERALS PO  Take 2 tablets by mouth 2 (two) times daily.     cyclobenzaprine 10 MG tablet  Commonly known as:  FLEXERIL  Take 10 mg by mouth 2 (two) times daily as needed for muscle spasms.     desvenlafaxine 100 MG 24 hr tablet  Commonly known as:  PRISTIQ  Take 100 mg by mouth every morning.     esomeprazole 40 MG packet  Commonly known as:  NEXIUM  Take 40 mg by mouth daily before breakfast.     Fish Oil 1000 MG Caps  Take 3,000 mg by mouth daily.     HYDROmorphone 2 MG tablet  Commonly known as:  DILAUDID  Take 1-2 tablets (2-4 mg total) by mouth every 4 (four) hours as needed for severe pain.     mesalamine 1.2 G EC tablet  Commonly known as:  LIALDA  Take 1,200 mg by mouth 2 (two) times daily.     OVER THE COUNTER MEDICATION  2 tablets.     OVER THE COUNTER MEDICATION  Take 1 tablet by mouth 2 (two) times daily as needed (for pain). *otc product, Tumeric     oxybutynin 10 MG 24 hr tablet  Commonly known as:  DITROPAN-XL  Take 10 mg by mouth daily.     oxybutynin 10 MG 24 hr tablet  Commonly known as:  DITROPAN-XL  Take 10 mg by mouth at bedtime.     pregabalin 50 MG capsule  Commonly known as:  LYRICA  Take 50 mg by mouth 2 (two) times daily.     thyroid 90 MG tablet  Commonly known as:  ARMOUR  Take 90 mg by mouth every morning.     Vitamin D3 2000 UNITS capsule  Take 2,000 Units by mouth  daily.     zolpidem 12.5 MG CR tablet  Commonly known as:  AMBIEN CR  Take 12.5 mg by mouth at bedtime.        Diagnostic Studies: Dg Chest 2 View  03/22/2013   CLINICAL DATA:  Knee osteoarthritis. Pre-op respiratory exam. Ulcerative colitis. Thyroid carcinoma.  EXAM: CHEST  2 VIEW  COMPARISON:  04/21/2009  FINDINGS: The heart size and mediastinal contours are within normal limits. Both lungs are clear. The visualized skeletal structures are unremarkable.  IMPRESSION: No active cardiopulmonary disease.   Electronically Signed   By: Myles Rosenthal M.D.   On: 03/22/2013 14:04    Disposition: 06-Home-Health Care Svc      Discharge Orders   Future Orders Complete By Expires   Call MD / Call 911  As directed    Comments:     If you experience chest pain or shortness of breath, CALL 911 and be transported to the hospital emergency room.  If you develope a fever above 101 F, pus (white drainage) or increased drainage or redness at the wound, or calf pain, call your surgeon's office.   Constipation Prevention  As directed    Comments:     Drink plenty of fluids.  Prune juice may be helpful.  You may use a stool softener, such as Colace (over the counter) 100 mg twice a day.  Use MiraLax (over the counter) for constipation as needed.   CPM  As directed    Comments:     Continuous passive motion machine (CPM):      Use the CPM from 0 degrees to 70 degrees for 8 hours per day.      You may increase by 5 degrees per day.  You may break it up into 2 or 3 sessions per day.      Use CPM for 1-2 weeks or until you are told to stop.   Diet general  As directed    Do not put a pillow under the knee. Place it under the heel.  As directed    Increase activity slowly as tolerated  As directed    Weight bearing as tolerated  As directed    Questions:     Laterality:     Extremity:        Follow-up Information   Follow up with GRAVES,JOHN L, MD. Schedule an appointment as soon as  possible for a  visit in 2 weeks.   Specialty:  Orthopedic Surgery   Contact information:   [address] [place] [address] [phone]        Signed: Matthew Folks 04/05/2013, 10:27 AM

## 2013-06-23 DIAGNOSIS — M189 Osteoarthritis of first carpometacarpal joint, unspecified: Secondary | ICD-10-CM

## 2013-06-23 HISTORY — DX: Osteoarthritis of first carpometacarpal joint, unspecified: M18.9

## 2013-07-04 ENCOUNTER — Other Ambulatory Visit: Payer: Self-pay | Admitting: Orthopedic Surgery

## 2013-07-18 ENCOUNTER — Encounter (HOSPITAL_BASED_OUTPATIENT_CLINIC_OR_DEPARTMENT_OTHER): Payer: Self-pay | Admitting: *Deleted

## 2013-07-24 ENCOUNTER — Ambulatory Visit (HOSPITAL_BASED_OUTPATIENT_CLINIC_OR_DEPARTMENT_OTHER)
Admission: RE | Admit: 2013-07-24 | Payer: BC Managed Care – PPO | Source: Ambulatory Visit | Admitting: Orthopedic Surgery

## 2013-07-24 HISTORY — DX: Overactive bladder: N32.81

## 2013-07-24 HISTORY — DX: Osteoarthritis of first carpometacarpal joint, unspecified: M18.9

## 2013-07-24 HISTORY — DX: Migraine, unspecified, not intractable, without status migrainosus: G43.909

## 2013-07-24 HISTORY — DX: Personal history of malignant neoplasm of thyroid: Z85.850

## 2013-07-24 SURGERY — FINGER ARTHROSCOPY WITH CARPOMETACARPEL (CMC) ARTHROPLASTY
Anesthesia: General | Laterality: Right

## 2014-02-10 ENCOUNTER — Other Ambulatory Visit: Payer: Self-pay | Admitting: Orthopedic Surgery

## 2014-02-10 DIAGNOSIS — M545 Low back pain: Secondary | ICD-10-CM

## 2014-02-17 ENCOUNTER — Ambulatory Visit
Admission: RE | Admit: 2014-02-17 | Discharge: 2014-02-17 | Disposition: A | Discharge status: Home / Self Care | Payer: Medicare Other | Source: Ambulatory Visit | Attending: Orthopedic Surgery | Admitting: Orthopedic Surgery

## 2014-02-17 DIAGNOSIS — M545 Low back pain: Secondary | ICD-10-CM

## 2014-02-24 ENCOUNTER — Other Ambulatory Visit: Payer: Self-pay | Admitting: Orthopedic Surgery

## 2014-02-25 ENCOUNTER — Encounter (HOSPITAL_COMMUNITY): Payer: Self-pay | Admitting: Pharmacy Technician

## 2014-02-28 ENCOUNTER — Encounter (HOSPITAL_COMMUNITY)
Admission: RE | Admit: 2014-02-28 | Discharge: 2014-02-28 | Disposition: A | Discharge status: Home / Self Care | Payer: Medicare Other | Source: Ambulatory Visit | Attending: Orthopedic Surgery | Admitting: Orthopedic Surgery

## 2014-02-28 ENCOUNTER — Encounter (HOSPITAL_COMMUNITY): Payer: Self-pay

## 2014-02-28 DIAGNOSIS — G4730 Sleep apnea, unspecified: Secondary | ICD-10-CM | POA: Diagnosis not present

## 2014-02-28 DIAGNOSIS — Z85850 Personal history of malignant neoplasm of thyroid: Secondary | ICD-10-CM | POA: Diagnosis not present

## 2014-02-28 DIAGNOSIS — F329 Major depressive disorder, single episode, unspecified: Secondary | ICD-10-CM | POA: Diagnosis not present

## 2014-02-28 DIAGNOSIS — E039 Hypothyroidism, unspecified: Secondary | ICD-10-CM | POA: Insufficient documentation

## 2014-02-28 DIAGNOSIS — K5190 Ulcerative colitis, unspecified, without complications: Secondary | ICD-10-CM | POA: Diagnosis not present

## 2014-02-28 DIAGNOSIS — Z01812 Encounter for preprocedural laboratory examination: Secondary | ICD-10-CM | POA: Insufficient documentation

## 2014-02-28 DIAGNOSIS — M79605 Pain in left leg: Secondary | ICD-10-CM | POA: Diagnosis not present

## 2014-02-28 DIAGNOSIS — M545 Low back pain: Secondary | ICD-10-CM | POA: Insufficient documentation

## 2014-02-28 DIAGNOSIS — K219 Gastro-esophageal reflux disease without esophagitis: Secondary | ICD-10-CM | POA: Insufficient documentation

## 2014-02-28 DIAGNOSIS — L309 Dermatitis, unspecified: Secondary | ICD-10-CM | POA: Insufficient documentation

## 2014-02-28 DIAGNOSIS — N3281 Overactive bladder: Secondary | ICD-10-CM | POA: Insufficient documentation

## 2014-02-28 DIAGNOSIS — M1990 Unspecified osteoarthritis, unspecified site: Secondary | ICD-10-CM | POA: Diagnosis not present

## 2014-02-28 DIAGNOSIS — M069 Rheumatoid arthritis, unspecified: Secondary | ICD-10-CM | POA: Insufficient documentation

## 2014-02-28 DIAGNOSIS — G43909 Migraine, unspecified, not intractable, without status migrainosus: Secondary | ICD-10-CM | POA: Insufficient documentation

## 2014-02-28 HISTORY — DX: Dermatitis, unspecified: L30.9

## 2014-02-28 LAB — COMPREHENSIVE METABOLIC PANEL
ALBUMIN: 3.7 g/dL (ref 3.5–5.2)
ALK PHOS: 99 U/L (ref 39–117)
ALT: 36 U/L — ABNORMAL HIGH (ref 0–35)
AST: 20 U/L (ref 0–37)
Anion gap: 13 (ref 5–15)
BUN: 18 mg/dL (ref 6–23)
CO2: 25 mEq/L (ref 19–32)
CREATININE: 0.7 mg/dL (ref 0.50–1.10)
Calcium: 9.2 mg/dL (ref 8.4–10.5)
Chloride: 102 mEq/L (ref 96–112)
GFR calc Af Amer: >90 mL/min (ref >90)
Glucose, Bld: 96 mg/dL (ref 70–99)
POTASSIUM: 4.2 meq/L (ref 3.7–5.3)
Sodium: 140 mEq/L (ref 137–147)
Total Protein: 7.1 g/dL (ref 6.0–8.3)

## 2014-02-28 LAB — CBC WITH DIFFERENTIAL/PLATELET
BASOS ABS: 0 10*3/uL (ref 0.0–0.1)
BASOS PCT: 0 % (ref 0–1)
Eosinophils Absolute: 0.3 10*3/uL (ref 0.0–0.7)
Eosinophils Relative: 3 % (ref 0–5)
HEMATOCRIT: 43.9 % (ref 36.0–46.0)
HEMOGLOBIN: 14.8 g/dL (ref 12.0–15.0)
LYMPHS PCT: 22 % (ref 12–46)
Lymphs Abs: 2.3 10*3/uL (ref 0.7–4.0)
MCH: 30 pg (ref 26.0–34.0)
MCHC: 33.7 g/dL (ref 30.0–36.0)
MCV: 88.9 fL (ref 78.0–100.0)
MONO ABS: 0.8 10*3/uL (ref 0.1–1.0)
MONOS PCT: 7 % (ref 3–12)
NEUTROS ABS: 7.1 10*3/uL (ref 1.7–7.7)
Neutrophils Relative %: 68 % (ref 43–77)
Platelets: 329 10*3/uL (ref 150–400)
RBC: 4.94 MIL/uL (ref 3.87–5.11)
RDW: 14.9 % (ref 11.5–15.5)
WBC: 10.5 10*3/uL (ref 4.0–10.5)

## 2014-02-28 LAB — TYPE AND SCREEN
ABO/RH(D): O POS
ANTIBODY SCREEN: NEGATIVE

## 2014-02-28 LAB — APTT: aPTT: 31 seconds (ref 24–37)

## 2014-02-28 LAB — SURGICAL PCR SCREEN
MRSA, PCR: NEGATIVE
Staphylococcus aureus: POSITIVE — AB

## 2014-02-28 LAB — PROTIME-INR
INR: 0.92 (ref 0.00–1.49)
Prothrombin Time: 12.4 seconds (ref 11.6–15.2)

## 2014-02-28 NOTE — Pre-Procedure Instructions (Signed)
DIANNIA HOGENSON  02/28/2014   Your procedure is scheduled on: Thursday, Oct. 15th   Report to [facility] Admitting at  8:45 AM.  Call this number if you have problems the morning of surgery: [phone]   Remember:   Do not eat food or drink liquids after midnight Wednesday.   Take these medicines the morning of surgery with A SIP OF WATER: Nexium, Gabapentin, Ditropan, Armour   Do not wear jewelry, make-up or nail polish.  Do not wear lotions, powders, or perfumes. You may  NOT wear deodorant.  Do not shave underarms & legs 48 hours prior to surgery.  Do not bring valuables to the hospital.  [facility] is not responsible for any belongings or valuables.               Contacts, dentures or bridgework may not be worn into surgery.  Leave suitcase in the car. After surgery it may be brought to your room.  For patients admitted to the hospital, discharge time is determined by your treatment team.              Name and phone number of your driver:    Special Instructions: "Preparing for Surgery" instruction sheet.   Please read over the following fact sheets that you were given: Pain Booklet, Coughing and Deep Breathing, Blood Transfusion Information, MRSA Information and Surgical Site Infection Prevention

## 2014-03-01 LAB — URINE CULTURE
COLONY COUNT: NO GROWTH
Culture: NO GROWTH

## 2014-03-05 MED ORDER — CEFAZOLIN SODIUM-DEXTROSE 2-3 GM-% IV SOLR
2.0000 g | INTRAVENOUS | Status: AC
  Administered 2014-03-06 (×2): 2 g via INTRAVENOUS
  Filled 2014-03-05: qty 50

## 2014-03-06 ENCOUNTER — Inpatient Hospital Stay (HOSPITAL_COMMUNITY): Payer: Medicare Other

## 2014-03-06 ENCOUNTER — Encounter (HOSPITAL_COMMUNITY): Payer: Self-pay | Admitting: Anesthesiology

## 2014-03-06 ENCOUNTER — Inpatient Hospital Stay (HOSPITAL_COMMUNITY)
Admission: RE | Admit: 2014-03-06 | Discharge: 2014-03-09 | DRG: 460 | Disposition: A | Discharge status: Home / Self Care | Payer: Medicare Other | Source: Ambulatory Visit | Attending: Orthopedic Surgery | Admitting: Orthopedic Surgery

## 2014-03-06 ENCOUNTER — Encounter (HOSPITAL_COMMUNITY): Payer: Medicare Other | Admitting: Anesthesiology

## 2014-03-06 ENCOUNTER — Encounter (HOSPITAL_COMMUNITY)
Admission: RE | Disposition: A | Discharge status: Home / Self Care | Payer: Medicare Other | Source: Ambulatory Visit | Attending: Orthopedic Surgery

## 2014-03-06 ENCOUNTER — Inpatient Hospital Stay (HOSPITAL_COMMUNITY): Payer: Medicare Other | Admitting: Anesthesiology

## 2014-03-06 DIAGNOSIS — M4306 Spondylolysis, lumbar region: Principal | ICD-10-CM | POA: Diagnosis present

## 2014-03-06 DIAGNOSIS — M069 Rheumatoid arthritis, unspecified: Secondary | ICD-10-CM | POA: Diagnosis present

## 2014-03-06 DIAGNOSIS — E039 Hypothyroidism, unspecified: Secondary | ICD-10-CM | POA: Diagnosis present

## 2014-03-06 DIAGNOSIS — K219 Gastro-esophageal reflux disease without esophagitis: Secondary | ICD-10-CM | POA: Diagnosis present

## 2014-03-06 DIAGNOSIS — M549 Dorsalgia, unspecified: Secondary | ICD-10-CM

## 2014-03-06 DIAGNOSIS — M4806 Spinal stenosis, lumbar region: Secondary | ICD-10-CM | POA: Diagnosis present

## 2014-03-06 DIAGNOSIS — M5410 Radiculopathy, site unspecified: Secondary | ICD-10-CM | POA: Diagnosis present

## 2014-03-06 DIAGNOSIS — Z96651 Presence of right artificial knee joint: Secondary | ICD-10-CM | POA: Diagnosis present

## 2014-03-06 DIAGNOSIS — M79605 Pain in left leg: Secondary | ICD-10-CM | POA: Diagnosis present

## 2014-03-06 HISTORY — PX: LUMBAR FUSION: SHX111

## 2014-03-06 SURGERY — POSTERIOR LUMBAR FUSION 1 LEVEL
Anesthesia: General | Laterality: Left

## 2014-03-06 MED ORDER — STERILE WATER FOR INJECTION IJ SOLN
INTRAMUSCULAR | Status: AC
  Filled 2014-03-06: qty 10

## 2014-03-06 MED ORDER — HYDROMORPHONE 0.3 MG/ML IV SOLN
INTRAVENOUS | Status: DC
  Administered 2014-03-06: 1.2 mg via INTRAVENOUS
  Administered 2014-03-06: 2.1 mg via INTRAVENOUS
  Administered 2014-03-06: 16:00:00 via INTRAVENOUS
  Administered 2014-03-07: 2.4 mg via INTRAVENOUS
  Administered 2014-03-07: 1.72 mg via INTRAVENOUS
  Filled 2014-03-06: qty 25

## 2014-03-06 MED ORDER — SODIUM CHLORIDE 0.9 % IV SOLN: 250.0000 mL | INTRAVENOUS | Status: DC

## 2014-03-06 MED ORDER — SUFENTANIL CITRATE 50 MCG/ML IV SOLN
50.0000 ug | INTRAVENOUS | Status: DC | PRN
  Administered 2014-03-06: .2 ug/kg/h via INTRAVENOUS

## 2014-03-06 MED ORDER — SUFENTANIL CITRATE 50 MCG/ML IV SOLN
INTRAVENOUS | Status: AC
  Filled 2014-03-06: qty 1

## 2014-03-06 MED ORDER — GLYCOPYRROLATE 0.2 MG/ML IJ SOLN
INTRAMUSCULAR | Status: AC
  Filled 2014-03-06: qty 2

## 2014-03-06 MED ORDER — LACTATED RINGERS IV SOLN
INTRAVENOUS | Status: DC | PRN
  Administered 2014-03-06: 11:00:00 via INTRAVENOUS

## 2014-03-06 MED ORDER — SODIUM CHLORIDE 0.9 % IJ SOLN
3.0000 mL | Freq: Two times a day (BID) | INTRAMUSCULAR | Status: DC
  Administered 2014-03-06 – 2014-03-07 (×3): 3 mL via INTRAVENOUS

## 2014-03-06 MED ORDER — 0.9 % SODIUM CHLORIDE (POUR BTL) OPTIME
TOPICAL | Status: DC | PRN
  Administered 2014-03-06: 1000 mL

## 2014-03-06 MED ORDER — SUCCINYLCHOLINE CHLORIDE 20 MG/ML IJ SOLN
INTRAMUSCULAR | Status: DC | PRN
  Administered 2014-03-06: 60 mg via INTRAVENOUS

## 2014-03-06 MED ORDER — POTASSIUM CHLORIDE IN NACL 20-0.9 MEQ/L-% IV SOLN
INTRAVENOUS | Status: DC
  Administered 2014-03-06: 19:00:00 via INTRAVENOUS
  Filled 2014-03-06 (×7): qty 1000

## 2014-03-06 MED ORDER — VITAMIN D 1000 UNITS PO TABS
2000.0000 [IU] | ORAL_TABLET | Freq: Every day | ORAL | Status: DC
  Administered 2014-03-07 – 2014-03-09 (×3): 2000 [IU] via ORAL
  Filled 2014-03-06 (×3): qty 2

## 2014-03-06 MED ORDER — EPHEDRINE SULFATE 50 MG/ML IJ SOLN
INTRAMUSCULAR | Status: DC | PRN
  Administered 2014-03-06: 5 mg via INTRAVENOUS

## 2014-03-06 MED ORDER — RISAQUAD PO CAPS
1.0000 | ORAL_CAPSULE | Freq: Every morning | ORAL | Status: DC
  Administered 2014-03-07 – 2014-03-09 (×3): 1 via ORAL
  Filled 2014-03-06 (×3): qty 1

## 2014-03-06 MED ORDER — ESOMEPRAZOLE MAGNESIUM 40 MG PO PACK: 40.0000 mg | PACK | Freq: Every day | ORAL | Status: DC

## 2014-03-06 MED ORDER — THYROID 30 MG PO TABS
15.0000 mg | ORAL_TABLET | Freq: Every day | ORAL | Status: DC
  Administered 2014-03-07: 15 mg via ORAL
  Filled 2014-03-06 (×2): qty 1

## 2014-03-06 MED ORDER — METHYLENE BLUE 1 % INJ SOLN
INTRAMUSCULAR | Status: AC
  Filled 2014-03-06: qty 10

## 2014-03-06 MED ORDER — SODIUM CHLORIDE 0.9 % IJ SOLN: 9.0000 mL | INTRAMUSCULAR | Status: DC | PRN

## 2014-03-06 MED ORDER — ZOLPIDEM TARTRATE 5 MG PO TABS: 5.0000 mg | ORAL_TABLET | Freq: Every evening | ORAL | Status: DC | PRN

## 2014-03-06 MED ORDER — LACTATED RINGERS IV SOLN
INTRAVENOUS | Status: DC
  Administered 2014-03-06 (×2): via INTRAVENOUS

## 2014-03-06 MED ORDER — HYDROMORPHONE 0.3 MG/ML IV SOLN
INTRAVENOUS | Status: AC
  Administered 2014-03-07: 01:00:00
  Filled 2014-03-06: qty 25

## 2014-03-06 MED ORDER — ONDANSETRON HCL 4 MG/2ML IJ SOLN
INTRAMUSCULAR | Status: DC | PRN
  Administered 2014-03-06: 4 mg via INTRAVENOUS

## 2014-03-06 MED ORDER — HYDROMORPHONE HCL 1 MG/ML IJ SOLN
0.2500 mg | INTRAMUSCULAR | Status: DC | PRN
  Administered 2014-03-06 (×4): 0.5 mg via INTRAVENOUS

## 2014-03-06 MED ORDER — PROPOFOL INFUSION 10 MG/ML OPTIME
INTRAVENOUS | Status: DC | PRN
  Administered 2014-03-06: 13:00:00 via INTRAVENOUS
  Administered 2014-03-06: 50 ug/kg/min via INTRAVENOUS

## 2014-03-06 MED ORDER — SODIUM CHLORIDE 0.9 % IV SOLN
INTRAVENOUS | Status: DC | PRN
  Administered 2014-03-06: 13:00:00 via INTRAVENOUS

## 2014-03-06 MED ORDER — NEOSTIGMINE METHYLSULFATE 10 MG/10ML IV SOLN
INTRAVENOUS | Status: AC
  Filled 2014-03-06: qty 1

## 2014-03-06 MED ORDER — ALBUMIN HUMAN 5 % IV SOLN
INTRAVENOUS | Status: DC | PRN
  Administered 2014-03-06: 13:00:00 via INTRAVENOUS

## 2014-03-06 MED ORDER — PHENYLEPHRINE HCL 10 MG/ML IJ SOLN
INTRAMUSCULAR | Status: DC | PRN
  Administered 2014-03-06 (×2): 40 ug via INTRAVENOUS

## 2014-03-06 MED ORDER — VECURONIUM BROMIDE 10 MG IV SOLR
INTRAVENOUS | Status: DC | PRN
  Administered 2014-03-06: 6 mg via INTRAVENOUS
  Administered 2014-03-06: 2 mg via INTRAVENOUS

## 2014-03-06 MED ORDER — DIPHENHYDRAMINE HCL 50 MG/ML IJ SOLN
12.5000 mg | Freq: Four times a day (QID) | INTRAMUSCULAR | Status: DC | PRN
  Administered 2014-03-07: 12.5 mg via INTRAVENOUS
  Filled 2014-03-06: qty 1

## 2014-03-06 MED ORDER — METHYLENE BLUE 1 % INJ SOLN
INTRAMUSCULAR | Status: DC | PRN
  Administered 2014-03-06: .2 mL via SUBMUCOSAL

## 2014-03-06 MED ORDER — MENTHOL 3 MG MT LOZG: 1.0000 | LOZENGE | OROMUCOSAL | Status: DC | PRN

## 2014-03-06 MED ORDER — ALUM & MAG HYDROXIDE-SIMETH 200-200-20 MG/5ML PO SUSP
30.0000 mL | Freq: Four times a day (QID) | ORAL | Status: DC | PRN
Start: 2014-03-06 — End: 2014-03-09

## 2014-03-06 MED ORDER — ONDANSETRON HCL 4 MG/2ML IJ SOLN
4.0000 mg | INTRAMUSCULAR | Status: DC | PRN
  Administered 2014-03-07: 4 mg via INTRAVENOUS
  Filled 2014-03-06 (×2): qty 2

## 2014-03-06 MED ORDER — THYROID 60 MG PO TABS
90.0000 mg | ORAL_TABLET | Freq: Every day | ORAL | Status: DC
  Administered 2014-03-07: 90 mg via ORAL
  Filled 2014-03-06 (×2): qty 1

## 2014-03-06 MED ORDER — THROMBIN 20000 UNITS EX SOLR
CUTANEOUS | Status: DC | PRN
  Administered 2014-03-06: 12:00:00 via TOPICAL

## 2014-03-06 MED ORDER — MIDAZOLAM HCL 5 MG/5ML IJ SOLN
INTRAMUSCULAR | Status: DC | PRN
  Administered 2014-03-06: 2 mg via INTRAVENOUS

## 2014-03-06 MED ORDER — MESALAMINE 1.2 G PO TBEC
2.4000 g | DELAYED_RELEASE_TABLET | Freq: Two times a day (BID) | ORAL | Status: DC
  Administered 2014-03-06 – 2014-03-09 (×6): 2.4 g via ORAL
  Filled 2014-03-06 (×7): qty 2

## 2014-03-06 MED ORDER — CEFAZOLIN SODIUM-DEXTROSE 2-3 GM-% IV SOLR
INTRAVENOUS | Status: AC
  Filled 2014-03-06: qty 50

## 2014-03-06 MED ORDER — GABAPENTIN 300 MG PO CAPS
300.0000 mg | ORAL_CAPSULE | Freq: Two times a day (BID) | ORAL | Status: DC
  Administered 2014-03-06 – 2014-03-09 (×6): 300 mg via ORAL
  Filled 2014-03-06 (×7): qty 1

## 2014-03-06 MED ORDER — HYDROMORPHONE HCL 1 MG/ML IJ SOLN
0.5000 mg | INTRAMUSCULAR | Status: DC | PRN
  Administered 2014-03-07 (×2): 1 mg via INTRAVENOUS
  Filled 2014-03-06 (×2): qty 1

## 2014-03-06 MED ORDER — SCOPOLAMINE 1 MG/3DAYS TD PT72
MEDICATED_PATCH | TRANSDERMAL | Status: AC
  Filled 2014-03-06: qty 1

## 2014-03-06 MED ORDER — ROCURONIUM BROMIDE 50 MG/5ML IV SOLN
INTRAVENOUS | Status: AC
  Filled 2014-03-06: qty 1

## 2014-03-06 MED ORDER — ACETAMINOPHEN 650 MG RE SUPP: 650.0000 mg | RECTAL | Status: DC | PRN

## 2014-03-06 MED ORDER — DEXAMETHASONE SODIUM PHOSPHATE 4 MG/ML IJ SOLN
INTRAMUSCULAR | Status: DC | PRN
  Administered 2014-03-06: 8 mg via INTRAVENOUS

## 2014-03-06 MED ORDER — BUPIVACAINE-EPINEPHRINE 0.25% -1:200000 IJ SOLN
INTRAMUSCULAR | Status: DC | PRN
  Administered 2014-03-06: 5 mL

## 2014-03-06 MED ORDER — LACTATED RINGERS IV SOLN: INTRAVENOUS | Status: DC | PRN

## 2014-03-06 MED ORDER — POVIDONE-IODINE 7.5 % EX SOLN
Freq: Once | CUTANEOUS | Status: DC
  Filled 2014-03-06: qty 118

## 2014-03-06 MED ORDER — ZOLPIDEM TARTRATE 5 MG PO TABS
5.0000 mg | ORAL_TABLET | Freq: Every evening | ORAL | Status: DC | PRN
  Administered 2014-03-07 – 2014-03-09 (×2): 5 mg via ORAL
  Filled 2014-03-06 (×2): qty 1

## 2014-03-06 MED ORDER — SCOPOLAMINE 1 MG/3DAYS TD PT72
MEDICATED_PATCH | TRANSDERMAL | Status: DC | PRN
  Administered 2014-03-06: 1 via TRANSDERMAL

## 2014-03-06 MED ORDER — PHENOL 1.4 % MT LIQD: 1.0000 | OROMUCOSAL | Status: DC | PRN

## 2014-03-06 MED ORDER — VECURONIUM BROMIDE 10 MG IV SOLR
INTRAVENOUS | Status: AC
  Filled 2014-03-06: qty 10

## 2014-03-06 MED ORDER — NALOXONE HCL 0.4 MG/ML IJ SOLN: 0.4000 mg | INTRAMUSCULAR | Status: DC | PRN

## 2014-03-06 MED ORDER — ACETAMINOPHEN 325 MG PO TABS: 650.0000 mg | ORAL_TABLET | ORAL | Status: DC | PRN

## 2014-03-06 MED ORDER — GLYCOPYRROLATE 0.2 MG/ML IJ SOLN
INTRAMUSCULAR | Status: DC | PRN
  Administered 2014-03-06: 0.3 mg via INTRAVENOUS

## 2014-03-06 MED ORDER — OXYCODONE-ACETAMINOPHEN 5-325 MG PO TABS: 1.0000 | ORAL_TABLET | ORAL | Status: DC | PRN

## 2014-03-06 MED ORDER — CEFAZOLIN SODIUM 1-5 GM-% IV SOLN
1.0000 g | Freq: Three times a day (TID) | INTRAVENOUS | Status: AC
  Administered 2014-03-06 – 2014-03-07 (×2): 1 g via INTRAVENOUS
  Filled 2014-03-06 (×2): qty 50

## 2014-03-06 MED ORDER — DIAZEPAM 5 MG PO TABS
5.0000 mg | ORAL_TABLET | Freq: Four times a day (QID) | ORAL | Status: DC | PRN
  Administered 2014-03-07 – 2014-03-09 (×6): 5 mg via ORAL
  Filled 2014-03-06 (×6): qty 1

## 2014-03-06 MED ORDER — DIPHENHYDRAMINE HCL 12.5 MG/5ML PO ELIX: 12.5000 mg | ORAL_SOLUTION | Freq: Four times a day (QID) | ORAL | Status: DC | PRN

## 2014-03-06 MED ORDER — LIDOCAINE HCL (CARDIAC) 20 MG/ML IV SOLN
INTRAVENOUS | Status: AC
  Filled 2014-03-06: qty 5

## 2014-03-06 MED ORDER — ARTIFICIAL TEARS OP OINT
TOPICAL_OINTMENT | OPHTHALMIC | Status: AC
  Filled 2014-03-06: qty 3.5

## 2014-03-06 MED ORDER — THROMBIN 20000 UNITS EX SOLR
CUTANEOUS | Status: AC
  Filled 2014-03-06: qty 20000

## 2014-03-06 MED ORDER — PHENYLEPHRINE 40 MCG/ML (10ML) SYRINGE FOR IV PUSH (FOR BLOOD PRESSURE SUPPORT)
PREFILLED_SYRINGE | INTRAVENOUS | Status: AC
  Filled 2014-03-06: qty 10

## 2014-03-06 MED ORDER — SODIUM CHLORIDE 0.9 % IJ SOLN: 3.0000 mL | INTRAMUSCULAR | Status: DC | PRN

## 2014-03-06 MED ORDER — MIDAZOLAM HCL 2 MG/2ML IJ SOLN
INTRAMUSCULAR | Status: AC
  Filled 2014-03-06: qty 2

## 2014-03-06 MED ORDER — SUFENTANIL CITRATE 50 MCG/ML IV SOLN
INTRAVENOUS | Status: DC | PRN
  Administered 2014-03-06: 10 ug via INTRAVENOUS

## 2014-03-06 MED ORDER — THROMBIN 20000 UNITS EX KIT
PACK | CUTANEOUS | Status: DC | PRN
  Administered 2014-03-06: 20000 [IU] via TOPICAL

## 2014-03-06 MED ORDER — SENNA 8.6 MG PO TABS
1.0000 | ORAL_TABLET | Freq: Two times a day (BID) | ORAL | Status: DC
  Administered 2014-03-06 – 2014-03-09 (×6): 8.6 mg via ORAL
  Filled 2014-03-06 (×7): qty 1

## 2014-03-06 MED ORDER — HYDROMORPHONE HCL 1 MG/ML IJ SOLN
INTRAMUSCULAR | Status: AC
  Filled 2014-03-06: qty 1

## 2014-03-06 MED ORDER — OXYBUTYNIN CHLORIDE ER 10 MG PO TB24
10.0000 mg | ORAL_TABLET | Freq: Every day | ORAL | Status: DC
  Administered 2014-03-07 – 2014-03-09 (×3): 10 mg via ORAL
  Filled 2014-03-06 (×3): qty 1

## 2014-03-06 MED ORDER — VENLAFAXINE HCL ER 75 MG PO CP24
75.0000 mg | ORAL_CAPSULE | Freq: Every day | ORAL | Status: DC
  Administered 2014-03-08 – 2014-03-09 (×2): 75 mg via ORAL
  Filled 2014-03-06 (×4): qty 1

## 2014-03-06 MED ORDER — PROPOFOL 10 MG/ML IV BOLUS
INTRAVENOUS | Status: DC | PRN
  Administered 2014-03-06: 50 mg via INTRAVENOUS
  Administered 2014-03-06: 150 mg via INTRAVENOUS

## 2014-03-06 MED ORDER — ONDANSETRON HCL 4 MG/2ML IJ SOLN: 4.0000 mg | Freq: Four times a day (QID) | INTRAMUSCULAR | Status: DC | PRN

## 2014-03-06 MED ORDER — ARTIFICIAL TEARS OP OINT
TOPICAL_OINTMENT | OPHTHALMIC | Status: DC | PRN
  Administered 2014-03-06: 1 via OPHTHALMIC

## 2014-03-06 MED ORDER — NEOSTIGMINE METHYLSULFATE 10 MG/10ML IV SOLN
INTRAVENOUS | Status: DC | PRN
  Administered 2014-03-06: 2 mg via INTRAVENOUS

## 2014-03-06 MED ORDER — BUPIVACAINE-EPINEPHRINE (PF) 0.25% -1:200000 IJ SOLN
INTRAMUSCULAR | Status: AC
  Filled 2014-03-06: qty 30

## 2014-03-06 MED ORDER — PROPOFOL 10 MG/ML IV BOLUS
INTRAVENOUS | Status: AC
  Filled 2014-03-06: qty 20

## 2014-03-06 MED ORDER — DOCUSATE SODIUM 100 MG PO CAPS
100.0000 mg | ORAL_CAPSULE | Freq: Two times a day (BID) | ORAL | Status: DC
Start: 2014-03-06 — End: 2014-03-09
  Administered 2014-03-06 – 2014-03-09 (×6): 100 mg via ORAL
  Filled 2014-03-06 (×7): qty 1

## 2014-03-06 MED ORDER — PANTOPRAZOLE SODIUM 40 MG PO TBEC
80.0000 mg | DELAYED_RELEASE_TABLET | Freq: Every day | ORAL | Status: DC
  Administered 2014-03-07 – 2014-03-09 (×3): 80 mg via ORAL
  Filled 2014-03-06 (×3): qty 2

## 2014-03-06 MED ORDER — LIDOCAINE HCL (CARDIAC) 20 MG/ML IV SOLN
INTRAVENOUS | Status: DC | PRN
  Administered 2014-03-06: 100 mg via INTRATRACHEAL
  Administered 2014-03-06: 60 mg via INTRAVENOUS

## 2014-03-06 SURGICAL SUPPLY — 87 items
APL SKNCLS STERI-STRIP NONHPOA (GAUZE/BANDAGES/DRESSINGS) ×1
BENZOIN TINCTURE PRP APPL 2/3 (GAUZE/BANDAGES/DRESSINGS) ×2 IMPLANT
BUR ROUND PRECISION 4.0 (BURR) ×2 IMPLANT
CAGE CONCORDE BULLET 9X8X27 (Cage) ×2 IMPLANT
CAGE SPNL PRLL BLT NOSE 27X9X8 (Cage) IMPLANT
CARTRIDGE OIL MAESTRO DRILL (MISCELLANEOUS) ×1 IMPLANT
CLSR STERI-STRIP ANTIMIC 1/2X4 (GAUZE/BANDAGES/DRESSINGS) ×1 IMPLANT
CONT SPEC STER OR (MISCELLANEOUS) ×2 IMPLANT
CORDS BIPOLAR (ELECTRODE) ×2 IMPLANT
COVER MAYO STAND STRL (DRAPES) ×4 IMPLANT
COVER SURGICAL LIGHT HANDLE (MISCELLANEOUS) ×2 IMPLANT
DIFFUSER DRILL AIR PNEUMATIC (MISCELLANEOUS) ×2 IMPLANT
DRAIN CHANNEL 15F RND FF W/TCR (WOUND CARE) ×1 IMPLANT
DRAPE C-ARM 42X72 X-RAY (DRAPES) ×2 IMPLANT
DRAPE ORTHO SPLIT 77X108 STRL (DRAPES) ×2
DRAPE POUCH INSTRU U-SHP 10X18 (DRAPES) ×2 IMPLANT
DRAPE SURG 17X23 STRL (DRAPES) ×6 IMPLANT
DRAPE SURG ORHT 6 SPLT 77X108 (DRAPES) ×1 IMPLANT
DURAPREP 26ML APPLICATOR (WOUND CARE) ×2 IMPLANT
ELECT BLADE 4.0 EZ CLEAN MEGAD (MISCELLANEOUS) ×2
ELECT CAUTERY BLADE 6.4 (BLADE) ×2 IMPLANT
ELECT REM PT RETURN 9FT ADLT (ELECTROSURGICAL) ×2
ELECTRODE BLDE 4.0 EZ CLN MEGD (MISCELLANEOUS) ×1 IMPLANT
ELECTRODE REM PT RTRN 9FT ADLT (ELECTROSURGICAL) ×1 IMPLANT
EVACUATOR SILICONE 100CC (DRAIN) ×1 IMPLANT
GAUZE SPONGE 4X4 12PLY STRL (GAUZE/BANDAGES/DRESSINGS) ×2 IMPLANT
GAUZE SPONGE 4X4 16PLY XRAY LF (GAUZE/BANDAGES/DRESSINGS) ×5 IMPLANT
GLOVE BIO SURGEON STRL SZ 6.5 (GLOVE) ×2 IMPLANT
GLOVE BIO SURGEON STRL SZ7 (GLOVE) ×2 IMPLANT
GLOVE BIO SURGEON STRL SZ8 (GLOVE) ×2 IMPLANT
GLOVE BIOGEL PI IND STRL 7.0 (GLOVE) ×1 IMPLANT
GLOVE BIOGEL PI IND STRL 7.5 (GLOVE) IMPLANT
GLOVE BIOGEL PI IND STRL 8 (GLOVE) ×1 IMPLANT
GLOVE BIOGEL PI INDICATOR 7.0 (GLOVE) ×3
GLOVE BIOGEL PI INDICATOR 7.5 (GLOVE) ×1
GLOVE BIOGEL PI INDICATOR 8 (GLOVE) ×1
GLOVE ECLIPSE 7.0 STRL STRAW (GLOVE) ×1 IMPLANT
GOWN STRL REUS W/ TWL LRG LVL3 (GOWN DISPOSABLE) ×2 IMPLANT
GOWN STRL REUS W/ TWL XL LVL3 (GOWN DISPOSABLE) ×1 IMPLANT
GOWN STRL REUS W/TWL LRG LVL3 (GOWN DISPOSABLE) ×4
GOWN STRL REUS W/TWL XL LVL3 (GOWN DISPOSABLE) ×2
IV CATH 14GX2 1/4 (CATHETERS) ×2 IMPLANT
KIT BASIN OR (CUSTOM PROCEDURE TRAY) ×2 IMPLANT
KIT POSITION SURG JACKSON T1 (MISCELLANEOUS) ×2 IMPLANT
KIT ROOM TURNOVER OR (KITS) ×2 IMPLANT
MARKER SKIN DUAL TIP RULER LAB (MISCELLANEOUS) ×2 IMPLANT
MIX DBX 10CC 35% BONE (Bone Implant) ×1 IMPLANT
NDL HYPO 25GX1X1/2 BEV (NEEDLE) ×1 IMPLANT
NDL SAFETY ECLIPSE 18X1.5 (NEEDLE) ×1 IMPLANT
NDL SPNL 18GX3.5 QUINCKE PK (NEEDLE) ×2 IMPLANT
NEEDLE 22X1 1/2 (OR ONLY) (NEEDLE) ×2 IMPLANT
NEEDLE BONE MARROW 8GX6 FENEST (NEEDLE) IMPLANT
NEEDLE HYPO 18GX1.5 SHARP (NEEDLE) ×2
NEEDLE HYPO 25GX1X1/2 BEV (NEEDLE) ×2 IMPLANT
NEEDLE SPNL 18GX3.5 QUINCKE PK (NEEDLE) ×4 IMPLANT
NEURO MONITORING STIM (LABOR (TRAVEL & OVERTIME)) ×1 IMPLANT
NS IRRIG 1000ML POUR BTL (IV SOLUTION) ×2 IMPLANT
OIL CARTRIDGE MAESTRO DRILL (MISCELLANEOUS) ×2
PACK LAMINECTOMY ORTHO (CUSTOM PROCEDURE TRAY) ×2 IMPLANT
PACK UNIVERSAL I (CUSTOM PROCEDURE TRAY) ×2 IMPLANT
PAD ARMBOARD 7.5X6 YLW CONV (MISCELLANEOUS) ×5 IMPLANT
PATTIES SURGICAL .5 X1 (DISPOSABLE) ×2 IMPLANT
PATTIES SURGICAL .5X1.5 (GAUZE/BANDAGES/DRESSINGS) ×2 IMPLANT
ROD PRE BENT EXP 40MM (Rod) ×2 IMPLANT
ROD PRE LORDOSED 5.5X45 (Rod) ×1 IMPLANT
SCREW EXPEDIUM POLYAXIAL 6X40 (Screw) ×4 IMPLANT
SCREW SET SINGLE INNER (Screw) ×4 IMPLANT
SPONGE INTESTINAL PEANUT (DISPOSABLE) ×2 IMPLANT
SPONGE SURGIFOAM ABS GEL 100 (HEMOSTASIS) ×2 IMPLANT
STRIP CLOSURE SKIN 1/2X4 (GAUZE/BANDAGES/DRESSINGS) ×4 IMPLANT
SURGIFLO TRUKIT (HEMOSTASIS) IMPLANT
SUT BONE WAX W31G (SUTURE) ×1 IMPLANT
SUT MNCRL AB 4-0 PS2 18 (SUTURE) ×4 IMPLANT
SUT VIC AB 0 CT1 18XCR BRD 8 (SUTURE) ×1 IMPLANT
SUT VIC AB 0 CT1 8-18 (SUTURE) ×2
SUT VIC AB 1 CT1 18XCR BRD 8 (SUTURE) ×2 IMPLANT
SUT VIC AB 1 CT1 8-18 (SUTURE) ×4
SUT VIC AB 2-0 CT2 18 VCP726D (SUTURE) ×2 IMPLANT
SYR 20CC LL (SYRINGE) ×2 IMPLANT
SYR BULB IRRIGATION 50ML (SYRINGE) ×2 IMPLANT
SYR CONTROL 10ML LL (SYRINGE) ×4 IMPLANT
SYR TB 1ML LUER SLIP (SYRINGE) ×2 IMPLANT
TAPE CLOTH SURG 4X10 WHT LF (GAUZE/BANDAGES/DRESSINGS) ×1 IMPLANT
TOWEL OR 17X24 6PK STRL BLUE (TOWEL DISPOSABLE) ×2 IMPLANT
TOWEL OR 17X26 10 PK STRL BLUE (TOWEL DISPOSABLE) ×2 IMPLANT
TRAY FOLEY CATH 16FRSI W/METER (SET/KITS/TRAYS/PACK) ×2 IMPLANT
YANKAUER SUCT BULB TIP NO VENT (SUCTIONS) ×2 IMPLANT

## 2014-03-06 NOTE — Progress Notes (Signed)
PACU nurse gave report that patient was on full-dose Dilaudid PCA.  Upon assessment, Dilaudid syringe was in PCA, but pump was set at "Full-Dose Fentanyl" settings.  Cleared 32mcg from patient history at this setting and reset pump to "Full-Dose Dilaudid" PCA setting as ordered.  Patient with no adverse effects or over sedation.  Safety Zone Portal completed.

## 2014-03-06 NOTE — Anesthesia Preprocedure Evaluation (Signed)
Anesthesia Evaluation  Patient identified by MRN, date of birth, ID band Patient awake    Reviewed: Allergy & Precautions, H&P , NPO status , Patient's Chart, lab work & pertinent test results  History of Anesthesia Complications Negative for: history of anesthetic complications  Airway Mallampati: II TM Distance: >3 FB Neck ROM: Full    Dental  (+) Teeth Intact   Pulmonary sleep apnea , neg COPD         Cardiovascular negative cardio ROS      Neuro/Psych  Headaches, PSYCHIATRIC DISORDERS Depression Low back pain with left leg symptoms  Neuromuscular disease    GI/Hepatic Neg liver ROS, GERD-  Medicated and Controlled,Ulcerative colitis   Endo/Other  neg diabetesHypothyroidism   Renal/GU negative Renal ROS     Musculoskeletal  (+) Arthritis -,   Abdominal   Peds  Hematology   Anesthesia Other Findings   Reproductive/Obstetrics                           Anesthesia Physical Anesthesia Plan  ASA: III  Anesthesia Plan: General   Post-op Pain Management:    Induction: Intravenous  Airway Management Planned: Oral ETT  Additional Equipment: None  Intra-op Plan:   Post-operative Plan: Extubation in OR  Informed Consent: I have reviewed the patients History and Physical, chart, labs and discussed the procedure including the risks, benefits and alternatives for the proposed anesthesia with the patient or authorized representative who has indicated his/her understanding and acceptance.   Dental advisory given  Plan Discussed with: CRNA and Surgeon  Anesthesia Plan Comments: (ssep and motor monitoring per surgeon)        Anesthesia Quick Evaluation

## 2014-03-06 NOTE — Transfer of Care (Signed)
Immediate Anesthesia Transfer of Care Note  Patient: Terry Shannon  Procedure(s) Performed: Procedure(s) with comments: POSTERIOR LUMBAR FUSION 1 LEVEL (Left) - Left sided lumbar 4-5 transforaminal lumbar interbody fusion with instrumentation and allograft  Patient Location: PACU  Anesthesia Type:General  Level of Consciousness: awake, alert , oriented and patient cooperative  Airway & Oxygen Therapy: Patient Spontanous Breathing and Patient connected to nasal cannula oxygen  Post-op Assessment: Report given to PACU RN and Post -op Vital signs reviewed and stable  Post vital signs: Reviewed  Complications: No apparent anesthesia complications

## 2014-03-06 NOTE — H&P (Signed)
PREOPERATIVE H&P  Chief Complaint: left leg pain  HPI: Terry Shannon is a 58 y.o. female who presents with ongoing pain in the left leg  MRI reveals NF stenosis on the left at L4/5, and grade 1 L4/5 anterolisthesis  Patient has failed multiple forms of conservative care and continues to have pain (see office notes for additional details regarding the patient's full course of treatment)  Past Medical History  Diagnosis Date  . Ulcerative colitis   . Allergy     year-round, pt. states  . Hypothyroidism   . GERD (gastroesophageal reflux disease)   . Chronic lower back pain   . Depression   . Complication of anesthesia     slow to wake up after gallbladder surgery  . Migraines   . RA (rheumatoid arthritis)   . Osteoarthritis   . Overactive bladder   . History of thyroid cancer   . 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 Sleep apnea     no CPAP use  . Eczema     ARMS AND HANDS   Past Surgical History  Procedure Laterality Date  . Carpal tunnel release Right 07/13/2001  . Hammer toe surgery Left may 2014  . Tonsillectomy  1961  . Knee arthroscopy Right 2013  . Total knee arthroplasty Right 04/01/2013    Procedure: RIGHT TOTAL KNEE ARTHROPLASTY;  Surgeon: Alta Corning, MD;  Location: [facility];  Service: Orthopedics;  Laterality: Right;  . Thyroidectomy, partial Right prior to 2002  . Thyroidectomy, partial Left 11/29/2000    and isthmus  . Carpal tunnel release Left 08/10/2001  . Hysteroscopy w/d&c  03/01/2004    with exc. of endometrial polyp  . Bunionectomy with chilectomy Right 12/16/2005  . Bunionectomy Right     x 2 more  . Laparoscopic cholecystectomy  11/03/2006  . Abdominal hernia repair  70/05/7492    periumbilical ventral hernia/incisional hernia  . Bunionectomy Left   . Dilation and curettage of uterus     History   Social History  . Marital Status: Significant Other    Spouse Name: N/A    Number of Children: N/A  . Years of  Education: N/A   Social History Main Topics  . Smoking status: Never Smoker   . Smokeless tobacco: Never Used  . Alcohol Use: 0.0 oz/week     Comment: 2-3 drinks/week  . Drug Use: No  . Sexual Activity: Not Currently    Birth Control/ Protection: None   Other Topics Concern  . Not on file   Social History Narrative  . No narrative on file   Family History  Problem Relation Age of Onset  . Stroke Mother   . Cancer Father 55    lung  . Cancer Maternal Grandmother 33    colon  . Stroke Maternal Grandmother   . Diabetes Brother   . Stroke Maternal Grandfather   . Diabetes Paternal Grandmother    Allergies  Allergen Reactions  . Codeine Nausea And Vomiting  . Hydrocodone Nausea And Vomiting  . Ibuprofen Other (See Comments)    MIGRAINES  . Iodinated Diagnostic Agents Hives and Itching  . Methotrexate Derivatives Other (See Comments)    GI UPSET  . Oxycodone Nausea And Vomiting  . Tylenol [Acetaminophen] Other (See Comments)    MIGRAINES  . Neosporin [Neomycin-Bacitracin Zn-Polymyx] Swelling    AT SITE  . Sulfa Antibiotics Other (See Comments)    UNKNOWN   Prior to  Admission medications   Medication Sig Start Date End Date Taking? Authorizing Provider  Calcium Carbonate-Vit D-Min (CALCIUM 600 + MINERALS PO) Take 2 tablets by mouth 2 (two) times daily.   Yes Historical Provider, MD  Cholecalciferol (VITAMIN D3) 2000 UNITS capsule Take 2,000 Units by mouth daily.   Yes Historical Provider, MD  cyclobenzaprine (FLEXERIL) 10 MG tablet Take 10 mg by mouth 3 (three) times daily as needed for muscle spasms (and back pain).    Yes Historical Provider, MD  desvenlafaxine (PRISTIQ) 100 MG 24 hr tablet Take 100 mg by mouth every morning.   Yes Historical Provider, MD  esomeprazole (NEXIUM) 40 MG packet Take 40 mg by mouth daily before breakfast.   Yes Historical Provider, MD  gabapentin (NEURONTIN) 300 MG capsule Take 300 mg by mouth 2 (two) times daily.   Yes Historical Provider,  MD  loperamide (IMODIUM A-D) 2 MG tablet Take 2 mg by mouth 4 (four) times daily as needed for diarrhea or loose stools.   Yes Historical Provider, MD  magnesium oxide (MAG-OX) 400 MG tablet Take 400 mg by mouth daily.   Yes Historical Provider, MD  mesalamine (LIALDA) 1.2 G EC tablet Take 2.4 g by mouth 2 (two) times daily.    Yes Historical Provider, MD  Omega-3 Fatty Acids (FISH OIL) 1000 MG CAPS Take 3,000 mg by mouth daily.   Yes Historical Provider, MD  OVER THE COUNTER MEDICATION Take 3 tablets by mouth 2 (two) times daily as needed (for pain). TURMERIC   Yes Historical Provider, MD  oxybutynin (DITROPAN-XL) 10 MG 24 hr tablet Take 10 mg by mouth daily.   Yes Historical Provider, MD  Probiotic Product (PROBIOTIC DAILY PO) Take 1 tablet by mouth daily.    Yes Historical Provider, MD  SUMAtriptan (IMITREX) 100 MG tablet Take 100 mg by mouth every 2 (two) hours as needed for migraine or headache. May repeat in 2 hours if headache persists or recurs.   Yes Historical Provider, MD  thyroid (ARMOUR) 15 MG tablet Take 15 mg by mouth daily. Take with 90 mg for a total of 105 mg.   Yes Historical Provider, MD  thyroid (ARMOUR) 90 MG tablet Take 90 mg by mouth daily. Take with 15 mg for a total of 105 mg.   Yes Historical Provider, MD  zolpidem (AMBIEN CR) 12.5 MG CR tablet Take 12.5 mg by mouth at bedtime.   Yes Historical Provider, MD  aspirin 325 MG tablet Take 325 mg by mouth 2 (two) times daily as needed for moderate pain.     Historical Provider, MD     All other systems have been reviewed and were otherwise negative with the exception of those mentioned in the HPI and as above.  Physical Exam: There were no vitals filed for this visit.  General: Alert, no acute distress Cardiovascular: No pedal edema Respiratory: No cyanosis, no use of accessory musculature Skin: No lesions in the area of chief complaint Neurologic: Sensation intact distally Psychiatric: Patient is competent for consent  with normal mood and affect Lymphatic: No axillary or cervical lymphadenopathy  MUSCULOSKELETAL: + SLR on left  Assessment/Plan: Low back and left leg pain Plan for Procedure(s): POSTERIOR LUMBAR FUSION 1 LEVEL   Sinclair Ship, MD 03/06/2014 7:14 AM

## 2014-03-06 NOTE — Op Note (Signed)
NAME:  Terry Shannon, Terry Shannon NO.:  [id number]  MEDICAL RECORD NO.:  [id number]  LOCATION:  [id number]                        FACILITY:  [facility]  PHYSICIAN:  Phylliss Bob, MD      DATE OF BIRTH:  12/29/1955  DATE OF PROCEDURE:  03/06/2014                              OPERATIVE REPORT   PREOPERATIVE DIAGNOSES: 1. Left-sided L4 radiculopathy. 2. 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 3. High grade 1 L4-5 spondylolisthesis.  POSTOPERATIVE DIAGNOSES: 1. Left-sided L4 radiculopathy. 2. 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 3. High grade 1 L4-5 spondylolisthesis.  PROCEDURES: 1. Left-sided L4-5 transforaminal lumbar interbody fusion. 2. Right-sided L4-5 posterolateral fusion. 3. L4-5 decompression, requiring much more bone removal than that     which would be required for the interbody fusion portion of     the procedure. 4. Placement of posterior instrumentation (7 x 40 mm screws). 5. Insertion of interbody device x1 (8 x 27 mm Concorde bullet cage). 6. Use of local autograft. 7. Use of morselized allograft.-DBX mixed. 8. Intraoperative use of fluoroscopy.  SURGEON:  Phylliss Bob, MD  ASSISTANT:  Nehemiah Massed, [facility]  ANESTHESIA:  General endotracheal anesthesia.  COMPLICATIONS:  None.  DISPOSITION:  Stable.  ESTIMATED BLOOD LOSS:  Minimal.  INDICATIONS FOR SURGERY:  Briefly, Ms Stegmaier is a very pleasant 58 year old female who did present to me with ongoing and severe pain in the left leg.  An MRI did clearly reveal substantial neural foraminal stenosis on the left side at L4-5.  Also identified was moderate to severe lateral recess stenosis at L4-5.  An anterolisthesis was also noted on radiographs.  The patient did fail various forms of nonoperative measures, and we did elect to proceed with the surgery noted above.  The patient did  fully understand the risks and limitations of the procedure, as outlined in my preoperative note.  OPERATIVE DETAILS:  On March 06, 2014, the patient was brought to surgery and general endotracheal anesthesia was administered.  The patient was placed prone on a well-padded flat Jackson bed with a Wilson frame.  Antibiotics were given.  The back was prepped and draped and a time-out procedure was performed.  A midline incision was made.  The fascia was incised at the midline.  The L4-5 facet joint was identified bilaterally.  There was substantial hypertrophy.  Using anatomical landmarks, I did cannulate the L4 and L5 pedicles on both the right and on the left sides.  I did also subperiosteally expose the transverse processes of L4 and at L5 on the patient's right side.  I then placed 6 x 40 mm screws into the L4 and L5 pedicles on the right.  A 40 mm rod was placed and distraction was applied across the rod.  Caps were placed and and a provisional tightening was performed.  On the patient's left side, I did perform a full facetectomy.  I then proceeded with bilateral lateral recess decompression, given the patient's substantial bilateral ligamentum flavum hypertrophy.  There was substantial compression of the exiting L4 nerve, however, this compression was adequately  addressed during a very thorough and complete decompression.  Of note, the decompression did require more bone removal and soft tissue removal than which was required for the interbody fusion portion of the procedure. With the assistant holding medial retraction of the traversing L5 nerve, I did use a 15-blade knife to perform an annulotomy at the left posterolateral aspect of the disk.  I then used a series of curettes and pituitary rongeurs to perform a thorough and complete L4-5 intervertebral diskectomy.  The endplates were then appropriately prepared.  I then placed a series of trials using intraoperative fluoroscopy.   I ultimately did pack the interbody space.  With the autograft from the decompression, in addition to allograft in the form of the DBX mix.  An 8 x 27 interbody spacer was then packed with autograft and allograft and tamped into position.  I was very pleased with the appearance of the interbody implant on the AP and lateral fluoroscopic images.  Distraction was then discontinued on the patient's contralateral right side.  I then placed 7 x 40 mm screws into the L4 and L5 pedicles on the left.  A 45 mm rod was placed and caps were placed and a final locking procedure was performed on the left and then on the right.  I then turned my attention towards the patient's right posterolateral gutter.  I did use a high-speed bur to decorticate the transverse processes and the posterior elements.  Autograft and allograft was then packed into the posterolateral gutter, to aid in the success of the fusion.  I was very pleased with the decompression and with the final fluoroscopic views.  I then controlled various areas of epidural bleeding using bipolar electrocautery.  I then placed a #15 deep Blake drain, deep to the fascia.  The fascia was then closed using #1 Vicryl.  The subcutaneous layer was then closed using 2-0 Vicryl and the skin was closed using 3-0 Monocryl.  All instrument counts were correct at the termination of the procedure.  Of note, Nehemiah Massed was my assistant throughout surgery, and did aid in retraction, suctioning, and closure throughout the surgery.     Phylliss Bob, MD     MD/MEDQ  D:  03/06/2014  T:  03/06/2014  Job:  [id number]

## 2014-03-07 MED ORDER — HYDROMORPHONE HCL 2 MG PO TABS
2.0000 mg | ORAL_TABLET | ORAL | Status: DC | PRN
  Administered 2014-03-07 – 2014-03-09 (×11): 4 mg via ORAL
  Filled 2014-03-07 (×11): qty 2

## 2014-03-07 MED ORDER — ONDANSETRON HCL 4 MG/2ML IJ SOLN: 4.0000 mg | Freq: Four times a day (QID) | INTRAMUSCULAR | Status: DC | PRN

## 2014-03-07 MED ORDER — THYROID 60 MG PO TABS
105.0000 mg | ORAL_TABLET | Freq: Every day | ORAL | Status: DC
  Administered 2014-03-08 – 2014-03-09 (×2): 105 mg via ORAL
  Filled 2014-03-07 (×3): qty 2

## 2014-03-07 MED ORDER — HYDROMORPHONE HCL 2 MG PO TABS
2.0000 mg | ORAL_TABLET | ORAL | Status: DC | PRN
  Administered 2014-03-07: 2 mg via ORAL
  Filled 2014-03-07: qty 1

## 2014-03-07 MED ORDER — DIPHENHYDRAMINE HCL 25 MG PO CAPS
25.0000 mg | ORAL_CAPSULE | Freq: Four times a day (QID) | ORAL | Status: DC | PRN
  Administered 2014-03-07: 25 mg via ORAL
  Administered 2014-03-07 – 2014-03-08 (×4): 50 mg via ORAL
  Filled 2014-03-07: qty 1
  Filled 2014-03-07 (×3): qty 2
  Filled 2014-03-07: qty 1

## 2014-03-07 MED ORDER — DIPHENHYDRAMINE HCL 25 MG PO CAPS
25.0000 mg | ORAL_CAPSULE | Freq: Four times a day (QID) | ORAL | Status: DC | PRN
Start: 2014-03-07 — End: 2014-03-07
  Administered 2014-03-07: 25 mg via ORAL
  Filled 2014-03-07: qty 1

## 2014-03-07 MED ORDER — OXYCODONE HCL 5 MG PO TABS: 5.0000 mg | ORAL_TABLET | ORAL | Status: DC | PRN

## 2014-03-07 MED FILL — Heparin Sodium (Porcine) Inj 1000 Unit/ML: INTRAMUSCULAR | Qty: 30 | Status: AC

## 2014-03-07 MED FILL — Sodium Chloride Irrigation Soln 0.9%: Qty: 3000 | Status: AC

## 2014-03-07 MED FILL — Sodium Chloride IV Soln 0.9%: INTRAVENOUS | Qty: 1000 | Status: AC

## 2014-03-07 NOTE — Evaluation (Addendum)
Occupational Therapy Evaluation and Discharge Patient Details Name: NEAL OSHEA MRN: [id number] DOB: 04/19/1956 Today's Date: 03/07/2014    History of Present Illness s/p posterior lumbar fusion 1 level   Clinical Impression   Pt admitted with the above diagnoses and presents with below problem list. Pt will benefit from continued acute OT to address the below listed deficits and maximize independence with basic ADLs prior to d/c.  PTA pt was independent with ADLs. PT currently at min A level for UB bathing/dressing. ADL education provided to pt and partner. Practiced donning/doffing spinal brace with partner assisting for practice.      Follow Up Recommendations  Supervision/Assistance - 24 hour;No OT follow up    Equipment Recommendations  None recommended by OT;Other (comment) (pt has 3n1)    Recommendations for Other Services       Precautions / Restrictions Precautions Precautions: Back Precaution Comments: pt able to state no bending, twisting, lifting; reviewed BAT precautions Required Braces or Orthoses: Spinal Brace Spinal Brace: Thoracolumbosacral orthotic;Applied in sitting position Restrictions Weight Bearing Restrictions: No Other Position/Activity Restrictions: BAT precautions      Mobility Bed Mobility Overal bed mobility: Needs Assistance Bed Mobility: Rolling;Sidelying to Sit;Sit to Sidelying Rolling: Min guard Sidelying to sit: Min guard     Sit to sidelying: Min guard General bed mobility comments: Pt with good bed mobility technique  Transfers Overall transfer level: Needs assistance   Transfers: Sit to/from Stand Sit to Stand: Min guard              Balance Overall balance assessment: Modified Independent                                          ADL Overall ADL's : Needs assistance/impaired Eating/Feeding: Set up;Sitting   Grooming: Set up;Sitting;Standing   Upper Body Bathing: Minimal assitance;Sitting    Lower Body Bathing: Min guard;Sit to/from stand   Upper Body Dressing : Minimal assistance;Sitting   Lower Body Dressing: Min guard;Sit to/from stand   Toilet Transfer: Min guard;Ambulation (3n1 over toilet)   Toileting- Clothing Manipulation and Hygiene: Min guard;Sit to/from stand   Tub/ Shower Transfer: Min guard;Ambulation;3 in 1   Functional mobility during ADLs: Min guard General ADL Comments: ADL education provided to pt and partner. Practiced donning/doffing spinal brace with partner assisting for practice.      Vision                     Perception     Praxis      Pertinent Vitals/Pain Pain Assessment: 0-10 Pain Score: 4  Pain Location: back Pain Descriptors / Indicators: Aching Pain Intervention(s): Limited activity within patient's tolerance;Monitored during session     Hand Dominance Right   Extremity/Trunk Assessment Upper Extremity Assessment Upper Extremity Assessment: Overall WFL for tasks assessed   Lower Extremity Assessment Lower Extremity Assessment: Defer to PT evaluation       Communication Communication Communication: No difficulties   Cognition Arousal/Alertness: Awake/alert Behavior During Therapy: WFL for tasks assessed/performed Overall Cognitive Status: Within Functional Limits for tasks assessed                     General Comments       Exercises       Shoulder Instructions      Home Living Family/patient expects to be discharged to:: Private residence Living Arrangements:  Other (Comment) (partner) Available Help at Discharge: Family;Available 24 hours/day Type of Home: House Home Access: Stairs to enter [employer] of Steps: 8-10 Entrance Stairs-Rails: Right Home Layout: Two level;Able to live on main level with bedroom/bathroom     Bathroom Shower/Tub: Occupational psychologist: Standard     Home Equipment: Environmental consultant - 2 wheels;Cane - single point;Crutches;Bedside commode;Hand  held shower head          Prior Functioning/Environment Level of Independence: Independent        Comments: no assistance for ambulation or ADLs    OT Diagnosis: Acute pain   OT Problem List: Decreased knowledge of use of DME or AE;Decreased knowledge of precautions;Pain   OT Treatment/Interventions: Self-care/ADL training;DME and/or AE instruction;Therapeutic activities;Patient/family education    OT Goals(Current goals can be found in the care plan section) Acute Rehab OT Goals Patient Stated Goal: not stated OT Goal Formulation: With patient Time For Goal Achievement: 03/14/14 Potential to Achieve Goals: Good ADL Goals Pt Will Perform Grooming: with modified independence;standing Pt Will Perform Lower Body Bathing: with modified independence;sit to/from stand Pt Will Perform Upper Body Dressing: with modified independence;sitting Pt Will Perform Lower Body Dressing: with modified independence;sit to/from stand  OT Frequency: Min 2X/week   Barriers to D/C:            Co-evaluation              End of Session Equipment Utilized During Treatment: Gait belt;Back brace  Activity Tolerance: Patient tolerated treatment well Patient left: in bed;with call bell/phone within reach;with family/visitor present   Time: 0762-2633 OT Time Calculation (min): 30 min Charges:  OT General Charges $OT Visit: 1 Procedure OT Evaluation $Initial OT Evaluation Tier I: 1 Procedure OT Treatments $Self Care/Home Management : 23-37 mins G-Codes:    Hortencia Pilar 2014/03/16, 12:46 PM

## 2014-03-07 NOTE — Plan of Care (Signed)
Problem: Consults Goal: Diagnosis - Spinal Surgery Thoraco/Lumbar Spine Fusion     

## 2014-03-07 NOTE — Progress Notes (Signed)
Utilization review completed. Matthew Cina, RN, BSN. 

## 2014-03-07 NOTE — Evaluation (Signed)
Physical Therapy Evaluation Patient Details Name: Terry Shannon MRN: [id number] DOB: Jan 10, 1956 Today's Date: 03/07/2014   History of Present Illness  58 y.o. fmeale s/p L4/5 decompression and fusion  Clinical Impression  Patient evaluated by Physical Therapy with no further acute PT needs identified. All education has been completed and the patient has no further questions. Safely ambulating without an assistive device, tolerated challenging dynamic balance tasks, and completed stair training. Pt has no further concerns regarding mobility and reports she feels confident with her functional abilities. See below for any follow-up Physial Therapy or equipment needs. PT is signing off. Thank you for this referral.     Follow Up Recommendations No PT follow up    Equipment Recommendations  None recommended by PT    Recommendations for Other Services       Precautions / Restrictions Precautions Precautions: Back Precaution Booklet Issued: Yes (comment) Precaution Comments: pt able to state no bending, twisting, lifting; reviewed BAT precautions Required Braces or Orthoses: Spinal Brace Spinal Brace: Thoracolumbosacral orthotic;Applied in sitting position Restrictions Weight Bearing Restrictions: No Other Position/Activity Restrictions: BAT precautions      Mobility  Bed Mobility Overal bed mobility: Needs Assistance Bed Mobility: Sidelying to Sit;Rolling Rolling: Supervision Sidelying to sit: Supervision     Sit to sidelying: Min guard General bed mobility comments: Supervision for safety. Cues for log roll technique. No physical assist required from flat bed surface.  Transfers Overall transfer level: Needs assistance Equipment used: None Transfers: Sit to/from Stand Sit to Stand: Supervision         General transfer comment: Supervision for safety. Performed from lowest bed setting. Places hands on thighs to assist with stand. Good stability once  standing.  Ambulation/Gait Ambulation/Gait assistance: Supervision Ambulation Distance (Feet): 130 Feet Assistive device: None Gait Pattern/deviations: Step-through pattern;Decreased stride length;Wide base of support     General Gait Details: Performed dynamic balance tasks with pt throughout ambulatory bout consisting of speed variation, quick turns, high marching, and backwards stepping. No loss of balance noted during bout. Pt mildy guarded but overall safe.  Stairs Stairs: Yes Stairs assistance: Min guard Stair Management: One rail Right;Step to pattern;Forwards Number of Stairs: 13 General stair comments: Practiced navigating full flight of steps similar to home environment. VC for sequencing. No loss of balance. Correctly demonstrates this technique without physical assist.  Wheelchair Mobility    Modified Rankin (Stroke Patients Only)       Balance Overall balance assessment: Modified Independent                                           Pertinent Vitals/Pain Pain Assessment: 0-10 Pain Score: 7  Pain Location: lower back Pain Descriptors / Indicators: Aching;Sharp Pain Intervention(s): Limited activity within patient's tolerance;Monitored during session;Repositioned;Patient requesting pain meds-RN notified    Home Living Family/patient expects to be discharged to:: Private residence Living Arrangements: Spouse/significant other Available Help at Discharge: Family;Available 24 hours/day Type of Home: House Home Access: Stairs to enter Entrance Stairs-Rails: Right Entrance Stairs-Number of Steps: 8-10 Home Layout: Two level;Able to live on main level with bedroom/bathroom Home Equipment: Gilford Rile - 2 wheels;Cane - single point;Crutches;Bedside commode;Hand held shower head      Prior Function Level of Independence: Independent         Comments: no assistance for ambulation or ADLs     Hand Dominance   Dominant Hand: Right  Extremity/Trunk Assessment   Upper Extremity Assessment: Defer to OT evaluation           Lower Extremity Assessment: Overall WFL for tasks assessed         Communication   Communication: No difficulties  Cognition Arousal/Alertness: Awake/alert Behavior During Therapy: WFL for tasks assessed/performed Overall Cognitive Status: Within Functional Limits for tasks assessed                      General Comments General comments (skin integrity, edema, etc.): Reviewed donning and adjusting TLSO.    Exercises        Assessment/Plan    PT Assessment Patent does not need any further PT services  PT Diagnosis Abnormality of gait   PT Problem List    PT Treatment Interventions     PT Goals (Current goals can be found in the Care Plan section) Acute Rehab PT Goals Patient Stated Goal: not stated PT Goal Formulation: All assessment and education complete, DC therapy    Frequency     Barriers to discharge        Co-evaluation               End of Session Equipment Utilized During Treatment: Gait belt;Back brace Activity Tolerance: Patient tolerated treatment well Patient left: in chair;with call bell/phone within reach;with nursing/sitter in room;with family/visitor present Nurse Communication: Mobility status         Time: 2458-0998 PT Time Calculation (min): 23 min   Charges:   PT Evaluation $Initial PT Evaluation Tier I: 1 Procedure PT Treatments $Gait Training: 8-22 mins   PT G Codes:         [employer] [place]  Ellouise Newer 03/07/2014, 2:22 PM

## 2014-03-07 NOTE — Progress Notes (Signed)
    Patient doing well Right leg pain is resolved + Minimal low back pain + itching  Physical Exam: Filed Vitals:   03/07/14 0615  BP: 137/72  Pulse: 77  Temp: 98.8 F (37.1 C)  Resp: 20    Dressing in place NVI  Drain output 130cc/12 hours  POD #1 s/p L4/5 decompression and fusion  - up with PT/OT, encourage ambulation - Percocet for pain, Valium for muscle spasms - likely d/c home tomorrow - d/c PCA - brace when up and ambulating - maintain drain until tomorrow

## 2014-03-07 NOTE — Anesthesia Postprocedure Evaluation (Signed)
  Anesthesia Post-op Note  Patient: Terry Shannon  Procedure(s) Performed: Procedure(s) with comments: POSTERIOR LUMBAR FUSION 1 LEVEL (Left) - Left sided lumbar 4-5 transforaminal lumbar interbody fusion with instrumentation and allograft  Patient Location: PACU  Anesthesia Type:General  Level of Consciousness: awake  Airway and Oxygen Therapy: Patient Spontanous Breathing  Post-op Pain: mild  Post-op Assessment: Post-op Vital signs reviewed, Patient's Cardiovascular Status Stable, Respiratory Function Stable, Patent Airway, No signs of Nausea or vomiting and Pain level controlled  Post-op Vital Signs: Reviewed and stable  Last Vitals:  Filed Vitals:   03/07/14 0615  BP: 137/72  Pulse: 77  Temp: 37.1 C  Resp: 20    Complications: No apparent anesthesia complications

## 2014-03-08 MED ORDER — HYDROMORPHONE HCL 2 MG PO TABS: 2.0000 mg | ORAL_TABLET | ORAL | Status: DC | PRN

## 2014-03-08 MED ORDER — SUMATRIPTAN SUCCINATE 100 MG PO TABS
100.0000 mg | ORAL_TABLET | ORAL | Status: DC | PRN
  Administered 2014-03-08: 100 mg via ORAL
  Filled 2014-03-08: qty 1

## 2014-03-08 NOTE — Progress Notes (Signed)
   PATIENT ID: Terry Shannon   2 Days Post-Op Procedure(s) (LRB): POSTERIOR LUMBAR FUSION 1 LEVEL (Left)  Subjective: Patient reports she is doing well, more sore than yesterday. She feels like she overdid it with PT but pain controlled with po pain rx. Denies right leg pain.   Objective:  Filed Vitals:   03/08/14 0620  BP: 91/69  Pulse: 85  Temp: 98.6 F (37 C)  Resp: 17     Drain removed today uneventfully with 40cc bloody fluid  Dressing c/d/i NVI  Labs:  No results found for this basename: HGB,  in the last 72 hoursNo results found for this basename: WBC, RBC, HCT, PLT,  in the last 72 hoursNo results found for this basename: NA, K, CL, CO2, BUN, CREATININE, GLUCOSE, CALCIUM,  in the last 72 hours  Assessment and Plan: Drain output 80 cc/12 hrs POD#2 s/p L4/5 decompression and fusion Increase ambulation today to achieve confidence in independence Percocet d/ced, Dilaudid 2mg  1-2 po prn for pain control, script changed Okay to d/c home today if pain controlled, will reassess in afternoon, may need to stay another day  Brace when up and ambulating

## 2014-03-09 MED ORDER — DIAZEPAM 5 MG PO TABS
10.0000 mg | ORAL_TABLET | Freq: Four times a day (QID) | ORAL | Status: DC | PRN
  Administered 2014-03-09: 10 mg via ORAL
  Filled 2014-03-09: qty 2

## 2014-03-09 MED ORDER — DIAZEPAM 10 MG PO TABS: 10.0000 mg | ORAL_TABLET | Freq: Four times a day (QID) | ORAL | Status: DC | PRN

## 2014-03-09 MED ORDER — HYDROMORPHONE HCL 2 MG PO TABS: 2.0000 mg | ORAL_TABLET | ORAL | Status: DC | PRN

## 2014-03-09 NOTE — Progress Notes (Signed)
   PATIENT ID: Terry Shannon   3 Days Post-Op Procedure(s) (LRB): POSTERIOR LUMBAR FUSION 1 LEVEL (Left)  Subjective: On commode this am. Had increased LBP pain overnight, but controlled with 10mg  Valium and Dilaudid 4mg  po. Feels like this rx regimen is controlling her pain well. Had increase in temp overnight 101.4, however denies chills, nausea, sweats. IS and po fluids given and temp down to 99. Again, no sx of fever this am. Denies radicular pain, lower extremity numbness and tingling.   Objective:  Filed Vitals:   03/09/14 0100  BP:   Pulse:   Temp: 99 F (37.2 C)  Resp:      Dressing c/d/i Surround skin no erythema, warmth No drainage from incision LE distally NVI  Labs:  No results found for this basename: HGB,  in the last 72 hoursNo results found for this basename: WBC, RBC, HCT, PLT,  in the last 72 hoursNo results found for this basename: NA, K, CL, CO2, BUN, CREATININE, GLUCOSE, CALCIUM,  in the last 72 hours  Assessment and Plan: POD#3 s/p L4/5 decompression and fusion  Temporary fever overnight, resolved this am and asymptomatic, continue IS Dilaudid 2mg  1-2 po prn for pain control, will change script Valium to 10mg  as her pain is better tolerated with this Plan to d/c home today, patient is agreeable to this Brace when up and ambulating

## 2014-03-09 NOTE — Progress Notes (Signed)
Pt continues to complain of severe back pain despite all the pain medication and valium given. PA Laliberte called twice during the night. Valium increased to 10 mg to help with pt's back pain. Pt's temperature was 101.4. Used IS and increased fluid intake and temperature normal at 99.

## 2014-03-10 NOTE — Care Management Note (Signed)
CARE MANAGEMENT NOTE 03/10/2014  Patient:  Terry Shannon, Terry Shannon   Account Number:  [id number]  Date Initiated:  03/10/2014  Documentation initiated by:  Ricki Miller  Subjective/Objective Assessment:   58 yr old female s/p L4-5 decompression and fusion.     Action/Plan:   Patient has no home health or DME needs identified.   Anticipated DC Date:  03/09/2014   Anticipated DC Plan:  [place]  CM consult      PAC Choice  NA   Choice offered to / List presented to:  NA   DME arranged  NA        HH arranged  NA      Status of service:  Completed, signed off Medicare Important Message given?  NA - LOS <3 / Initial given by admissions (If response is "NO", the following Medicare IM given date fields will be blank) Date Medicare IM given:   Medicare IM given by:   Date Additional Medicare IM given:   Additional Medicare IM given by:    Discharge Disposition:  HOME/SELF CARE  Per UR Regulation:  Reviewed for med. necessity/level of care/duration of stay

## 2014-03-11 ENCOUNTER — Encounter (HOSPITAL_COMMUNITY): Payer: Self-pay | Admitting: Orthopedic Surgery

## 2014-03-12 NOTE — Discharge Summary (Signed)
Patient ID: Terry Shannon MRN: [id number] DOB/AGE: 60-Jun-1957 60 y.o.  Admit date: 03/06/2014 Discharge date: 03/10/2014  Admission Diagnoses:  Active Problems:   Radiculopathy   Discharge Diagnoses:  Same  Past Medical History  Diagnosis Date  . Ulcerative colitis   . Allergy     year-round, pt. states  . Hypothyroidism   . GERD (gastroesophageal reflux disease)   . Chronic lower back pain   . Depression   . Complication of anesthesia     slow to wake up after gallbladder surgery  . Migraines   . RA (rheumatoid arthritis)   . Osteoarthritis   . Overactive bladder   . History of thyroid cancer   . 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 Sleep apnea     no CPAP use  . Eczema     ARMS AND HANDS    Surgeries: Procedure(s): POSTERIOR LUMBAR FUSION 1 LEVEL on 03/06/2014   Consultants:    Discharged Condition: Improved  Hospital Course: Terry Shannon is an 58 y.o. female who was admitted 03/06/2014 for operative treatment of:  1. Left-sided L4 radiculopathy.  2. 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  3. High grade 1 L4-5 spondylolisthesis.  POSTOPERATIVE DIAGNOSES:  1. Left-sided L4 radiculopathy.  2. 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  3. High grade 1 L4-5 spondylolisthesis.     Patient has severe unremitting pain that affects sleep, daily activities, and work/hobbies. After pre-op clearance the patient was taken to the operating room on 03/06/2014 and underwent  Procedure(s): POSTERIOR LUMBAR FUSION 1 LEVEL.    Patient was given perioperative antibiotics:  Anti-infectives   Start     Dose/Rate Route Frequency Ordered Stop   03/06/14 2200  ceFAZolin (ANCEF) IVPB 1 g/50 mL premix     1 g 100 mL/hr over 30 Minutes Intravenous Every 8 hours 03/06/14 1719 03/07/14 0610   03/06/14 0600  ceFAZolin (ANCEF) IVPB 2 g/50 mL  premix     2 g 100 mL/hr over 30 Minutes Intravenous On call to O.R. 03/05/14 1454 03/06/14 1420       Patient was given sequential compression devices, early ambulation to prevent DVT.  Patient benefited maximally from hospital stay and there were no complications. Admitted 3 days post operatively for observation and pain control.   Recent vital signs: No data found.    Recent laboratory studies: No results found for this basename: WBC, HGB, HCT, PLT, NA, K, CL, CO2, BUN, CREATININE, GLUCOSE, PT, INR, CALCIUM, 2,  in the last 72 hours   Discharge Medications:     Medication List    STOP taking these medications       aspirin 325 MG tablet     cyclobenzaprine 10 MG tablet  Commonly known as:  FLEXERIL      TAKE these medications       CALCIUM 600 + MINERALS PO  Take 2 tablets by mouth 2 (two) times daily.     desvenlafaxine 100 MG 24 hr tablet  Commonly known as:  PRISTIQ  Take 100 mg by mouth every morning.     diazepam 10 MG tablet  Commonly known as:  VALIUM  Take 1 tablet (10 mg total) by mouth every 6 (six) hours as needed for anxiety.     esomeprazole 40 MG packet  Commonly known as:  NEXIUM  Take 40 mg by mouth daily before breakfast.  Fish Oil 1000 MG Caps  Take 3,000 mg by mouth daily.     gabapentin 300 MG capsule  Commonly known as:  NEURONTIN  Take 300 mg by mouth 2 (two) times daily.     HYDROmorphone 2 MG tablet  Commonly known as:  DILAUDID  Take 1-2 tablets (2-4 mg total) by mouth every 4 (four) hours as needed for severe pain.     loperamide 2 MG tablet  Commonly known as:  IMODIUM A-D  Take 2 mg by mouth 4 (four) times daily as needed for diarrhea or loose stools.     magnesium oxide 400 MG tablet  Commonly known as:  MAG-OX  Take 400 mg by mouth daily.     mesalamine 1.2 G EC tablet  Commonly known as:  LIALDA  Take 2.4 g by mouth 2 (two) times daily.     OVER THE COUNTER MEDICATION  Take 3 tablets by mouth 2 (two) times daily  as needed (for pain). TURMERIC     oxybutynin 10 MG 24 hr tablet  Commonly known as:  DITROPAN-XL  Take 10 mg by mouth daily.     PROBIOTIC DAILY PO  Take 1 tablet by mouth daily.     SUMAtriptan 100 MG tablet  Commonly known as:  IMITREX  Take 100 mg by mouth every 2 (two) hours as needed for migraine or headache. May repeat in 2 hours if headache persists or recurs.     thyroid 15 MG tablet  Commonly known as:  ARMOUR  Take 15 mg by mouth daily. Take with 90 mg for a total of 105 mg.     thyroid 90 MG tablet  Commonly known as:  ARMOUR  Take 90 mg by mouth daily. Take with 15 mg for a total of 105 mg.     Vitamin D3 2000 UNITS capsule  Take 2,000 Units by mouth daily.     zolpidem 12.5 MG CR tablet  Commonly known as:  AMBIEN CR  Take 12.5 mg by mouth at bedtime.        Diagnostic Studies: Dg Lumbar Spine 2-3 Views  03/06/2014   CLINICAL DATA:  Status post posterior fusion  EXAM: LUMBAR SPINE - 2-3 VIEW; DG C-ARM 61-120 MIN  COMPARISON:  Lumbar MRI February 17, 2014  FINDINGS: Frontal and lateral views were obtained. There is posterior screw and plate fixation at L4 and L5 with the screw tips in the respective vertebral bodies. There is a disc spacer at L4-5. The screw and plate fixation devices as well as the disc spacer appear intact. There is again noted anterolisthesis of L4 on L5. No other spondylolisthesis. No fracture. No erosive change.  IMPRESSION: Postoperative change. Mild spondylolisthesis at L4-5. No fracture apparent.   Electronically Signed   By: Lowella Grip M.D.   On: 03/06/2014 14:36   Mr Lumbar Spine Wo Contrast  02/18/2014   CLINICAL DATA:  Chronic low back pain. Pain radiates down the left leg.  EXAM: MRI LUMBAR SPINE WITHOUT CONTRAST  TECHNIQUE: Multiplanar, multisequence MR imaging of the lumbar spine was performed. No intravenous contrast was administered.  COMPARISON:  11/07/2012  FINDINGS: Transitional lumbosacral anatomy with numbering continued  from the prior MRI, with the lowest fully formed intervertebral disc space labeled L5-S1.  There is approximately 7 mm anterolisthesis of L4 on L5, stable to minimally increased. There is also trace retrolisthesis of L2 on L3, unchanged. There is no evidence of compression fracture. Multiple small Schmorl's nodes are present  in the lower thoracic and lumbar spine. Moderate disc space narrowing is present from L1-2 to L4-5, overall slightly progressed from the prior study. Degenerative marrow changes are present at these levels including mild discogenic marrow edema, most notable at L2-3 and L4-5 and mildly increased from prior. The conus medullaris is normal in signal and terminates at the superior L1 level. Paraspinal soft tissues are unremarkable.  L1-2:  Mild-to-moderate disc bulging without stenosis, unchanged.  L2-3: Disc bulging and mild facet hypertrophy result in mild right lateral recess stenosis, unchanged. No spinal canal or neural foraminal stenosis.  L3-4: Disc bulging and mild facet and ligamentum flavum hypertrophy without stenosis, unchanged.  L4-5: Listhesis with uncovering of the disc and severe facet arthrosis result in severe spinal canal stenosis, mildly increased from prior. Mild right and moderate left neural foraminal stenosis do not appear significantly changed. Left foraminal disc extrusion is slightly less conspicuous, although this may be due to slice selection.  L5-S1:  Mild facet arthrosis without stenosis, unchanged.  IMPRESSION: Advanced multilevel lumbar disc degeneration, overall mildly progressed from the prior study and most notable at L4-5 where there is now severe spinal canal stenosis.   Electronically Signed   By: Logan Bores   On: 02/18/2014 08:32   Dg Lumbar Spine 1 View  03/06/2014   CLINICAL DATA:  58 year old female with posterior lumbar fusion.  EXAM: Intraoperative spot images.  COMPARISON:  MR 02/17/2014  FINDINGS: Intraoperative cross-table lateral spot images  demonstrate localization of the L4-L5 interface. Retractors are evident overlying the spinous process ease, and there is a surgical curette at the level of the L4-L5 facet joint.  Grade 1 anterolisthesis of L4 on L5.  IMPRESSION: Intraoperative cross-table lateral demonstrate localization of the L4-L5 joint space where there is grade 1 anterolisthesis.  Signed,  Dulcy Fanny. Earleen Newport, DO  [facility]  [facility]   Electronically Signed   By: Corrie Mckusick D.O.   On: 03/06/2014 14:07   Dg C-arm 1-60 Min  03/06/2014   CLINICAL DATA:  Status post posterior fusion  EXAM: LUMBAR SPINE - 2-3 VIEW; DG C-ARM 61-120 MIN  COMPARISON:  Lumbar MRI February 17, 2014  FINDINGS: Frontal and lateral views were obtained. There is posterior screw and plate fixation at L4 and L5 with the screw tips in the respective vertebral bodies. There is a disc spacer at L4-5. The screw and plate fixation devices as well as the disc spacer appear intact. There is again noted anterolisthesis of L4 on L5. No other spondylolisthesis. No fracture. No erosive change.  IMPRESSION: Postoperative change. Mild spondylolisthesis at L4-5. No fracture apparent.   Electronically Signed   By: Lowella Grip M.D.   On: 03/06/2014 14:36    Disposition: 01-Home or Self Care      Discharge Instructions   Call MD / Call 911    Complete by:  As directed   If you experience chest pain or shortness of breath, CALL 911 and be transported to the hospital emergency room.  If you develope a fever above 101 F, pus (white drainage) or increased drainage or redness at the wound, or calf pain, call your surgeon's office.     Constipation Prevention    Complete by:  As directed   Drink plenty of fluids.  Prune juice may be helpful.  You may use a stool softener, such as Colace (over the counter) 100 mg twice a day.  Use MiraLax (over the counter) for constipation as needed.  Diet - low sodium heart healthy     Complete by:  As directed      Increase activity slowly as tolerated    Complete by:  As directed               Signed: Grier Mitts 03/12/2014, 8:58 AM

## 2014-03-17 ENCOUNTER — Ambulatory Visit (INDEPENDENT_AMBULATORY_CARE_PROVIDER_SITE_OTHER): Payer: BC Managed Care – PPO | Admitting: Family Medicine

## 2014-03-17 VITALS — BP 138/90 | HR 81 | Temp 98.1°F | Resp 18 | Wt 188.0 lb

## 2014-03-17 DIAGNOSIS — K5903 Drug induced constipation: Secondary | ICD-10-CM

## 2014-03-17 DIAGNOSIS — N320 Bladder-neck obstruction: Secondary | ICD-10-CM

## 2014-03-17 DIAGNOSIS — K5909 Other constipation: Secondary | ICD-10-CM

## 2014-03-17 DIAGNOSIS — IMO0001 Reserved for inherently not codable concepts without codable children: Secondary | ICD-10-CM

## 2014-03-17 DIAGNOSIS — R350 Frequency of micturition: Secondary | ICD-10-CM

## 2014-03-17 DIAGNOSIS — T402X5A Adverse effect of other opioids, initial encounter: Secondary | ICD-10-CM

## 2014-03-17 DIAGNOSIS — R300 Dysuria: Secondary | ICD-10-CM

## 2014-03-17 LAB — POCT URINALYSIS DIPSTICK
Bilirubin, UA: NEGATIVE
Glucose, UA: NEGATIVE
KETONES UA: NEGATIVE
Leukocytes, UA: NEGATIVE
Nitrite, UA: NEGATIVE
PROTEIN UA: NEGATIVE
RBC UA: NEGATIVE
Urobilinogen, UA: 0.2
pH, UA: 5.5

## 2014-03-17 LAB — POCT UA - MICROSCOPIC ONLY
Bacteria, U Microscopic: NEGATIVE
CRYSTALS, UR, HPF, POC: NEGATIVE
Casts, Ur, LPF, POC: NEGATIVE
EPITHELIAL CELLS, URINE PER MICROSCOPY: NEGATIVE
Mucus, UA: POSITIVE
RBC, urine, microscopic: NEGATIVE
WBC, UR, HPF, POC: NEGATIVE
YEAST UA: NEGATIVE

## 2014-03-17 NOTE — Patient Instructions (Signed)
Start twice a day miralax including 2 doses/night. In addition to make sure your constipation is fully treated you can augment with senokot S 2 tabs every night and double you magnesium supplement.  You may want to consider trying a fleets enema and if you having pain with a BM then glycerine suppositories can help.  If you have additional specific questions about combining stool softeners with laxative in addition to the miralax, don't hesitate to call or ask your pharmacist.  If you would like to try some Azo (or pyridium) to see if it would help relieve your urinary symptoms while you culture is growing over the next 2 days, that would be fine - if it helps a lot, please call or email over [place] and let me know asap.   Preventing Constipation After Surgery Constipation is when a person has fewer than 3 bowel movements a week; has difficulty having a bowel movement; or has stools that are dry, hard, or larger than normal. Many things can make constipation likely after surgery. They include:  Medicines, especially numbing medicines (anesthetics) and very strong pain medicines called narcotics.  Feeling stressed because of the surgery.  Eating different foods than normal.  Being less active. Symptoms of constipation include:  Having fewer than 3 bowel movements a week.  Straining to have a bowel movement.  Having hard, dry, or larger-than-normal stools.  Feeling full or bloated.  Having pain in the lower abdomen.  Not feeling relief after having a bowel movement. HOME CARE INSTRUCTIONS  Diet  Eat foods that have a lot of fiber. These include fruits, vegetables, whole grains, and beans. Limit foods high in fat and processed sugars. These include french fries, hamburgers, cookies, and candy.  Take a fiber supplement as directed. If you are not taking a fiber supplement and think that you are not getting enough fiber from foods, talk to your health care provider about adding a fiber  supplement to your diet.  Drink clear fluids, especially water. Avoid drinking alcohol, caffeine, and soda. These can make constipation worse.  Drink enough fluids to keep your urine clear or pale yellow. Activity   After surgery, return to your normal activities slowly or when your health care provider says it is okay.  Start walking as soon as you can. Try to go a little farther each day.  Once your health care provider approves, do some sort of regular exercise. This helps prevent constipation. Bowel Movements  Go to the restroom when you have the urge to go. Do not hold it in.  Try drinking something hot to get a bowel movement started.  Keep track of how often you use the restroom. If you miss 2-3 bowel movements, talk to your health care provider about medicines that prevent constipation. Your health care provider may suggest a stool softener, laxative, or fiber supplement.  Only take over-the-counter or prescription medicines as directed by your health care provider.  Do not take other medicines without talking to your health care provider first. If you become constipated and take a medicine to make you have a bowel movement, the problem may get worse. Other kinds of medicine can also make the problem worse. SEEK MEDICAL CARE IF:  You used stool softeners or laxatives and still have not had a bowel movement within 24-48 hours after using them.  You have not had a bowel movement in 3 days. SEEK IMMEDIATE MEDICAL CARE IF:   Your constipation lasts for more than 4 days or  gets worse.  You have bright red blood in your stool.  You have abdominal or rectal pain.  You have very bad cramping.  You have thin, pencil-like stools.  You have unexplained weight loss.  You have a fever or persistent symptoms for more than 2-3 days.  You have a fever and your symptoms suddenly get worse. Document Released: 09/03/2012 Document Revised: 09/23/2013 Document Reviewed:  09/03/2012 [facility] Patient Information 2015 [place], [place]. This information is not intended to replace advice given to you by your health care provider. Make sure you discuss any questions you have with your health care provider.   Constipation Constipation is when a person has fewer than three bowel movements a week, has difficulty having a bowel movement, or has stools that are dry, hard, or larger than normal. As people grow older, constipation is more common. If you try to fix constipation with medicines that make you have a bowel movement (laxatives), the problem may get worse. Long-term laxative use may cause the muscles of the colon to become weak. A low-fiber diet, not taking in enough fluids, and taking certain medicines may make constipation worse.  CAUSES   Certain medicines, such as antidepressants, pain medicine, iron supplements, antacids, and water pills.   Certain diseases, such as diabetes, irritable bowel syndrome (IBS), thyroid disease, or depression.   Not drinking enough water.   Not eating enough fiber-rich foods.   Stress or travel.   Lack of physical activity or exercise.   Ignoring the urge to have a bowel movement.   Using laxatives too much.  SIGNS AND SYMPTOMS   Having fewer than three bowel movements a week.   Straining to have a bowel movement.   Having stools that are hard, dry, or larger than normal.   Feeling full or bloated.   Pain in the lower abdomen.   Not feeling relief after having a bowel movement.  DIAGNOSIS  Your health care provider will take a medical history and perform a physical exam. Further testing may be done for severe constipation. Some tests may include:  A barium enema X-ray to examine your rectum, colon, and, sometimes, your small intestine.   A sigmoidoscopy to examine your lower colon.   A colonoscopy to examine your entire colon. TREATMENT  Treatment will depend on the severity of your  constipation and what is causing it. Some dietary treatments include drinking more fluids and eating more fiber-rich foods. Lifestyle treatments may include regular exercise. If these diet and lifestyle recommendations do not help, your health care provider may recommend taking over-the-counter laxative medicines to help you have bowel movements. Prescription medicines may be prescribed if over-the-counter medicines do not work.  HOME CARE INSTRUCTIONS   Eat foods that have a lot of fiber, such as fruits, vegetables, whole grains, and beans.  Limit foods high in fat and processed sugars, such as french fries, hamburgers, cookies, candies, and soda.   A fiber supplement may be added to your diet if you cannot get enough fiber from foods.   Drink enough fluids to keep your urine clear or pale yellow.   Exercise regularly or as directed by your health care provider.   Go to the restroom when you have the urge to go. Do not hold it.   Only take over-the-counter or prescription medicines as directed by your health care provider. Do not take other medicines for constipation without talking to your health care provider first.  [place] IF:   You  have bright red blood in your stool.   Your constipation lasts for more than 4 days or gets worse.   You have abdominal or rectal pain.   You have thin, pencil-like stools.   You have unexplained weight loss. MAKE SURE YOU:   Understand these instructions.  Will watch your condition.  Will get help right away if you are not doing well or get worse. Document Released: 02/05/2004 Document Revised: 05/14/2013 Document Reviewed: 02/18/2013 [facility] Patient Information 2015 [place], [place]. This information is not intended to replace advice given to you by your health care provider. Make sure you discuss any questions you have with your health care provider.

## 2014-03-17 NOTE — Progress Notes (Signed)
Subjective:  This chart was scribed for Delman Cheadle, MD, by Starleen Arms, ED Scribe. This patient was seen in room Rm 2 and the patient's care was started at 7:23 PM.   Patient ID: Terry Shannon, female    DOB: 02/16/1956, 58 y.o.   MRN: [id number] Chief Complaint  Patient presents with  . Urinary Tract Infection    HPI HPI Comments: Terry Shannon is a 58 y.o. female who had a lumbar effusion 11 days ago by Dr. Lynann Bologna.  She is still wearing her back brace . She had a urine culture done prior to her surgery which was normal/negative.    She presents to the [facility] complaining of worsening burning dysuria, increased urinary frequency, and post-void dribble onset after the removal of the catheter that was placed after her effusion.  The catheter was placed for < 5 days. Patient reports that it is difficult to begin urination upon sitting on the toilet, but she notices some release of urine after she stands.   Patient reports she is constipated and denies having a bowel movement today but states she had 3 yesterday that were either bulky or small and pellet like.  Patient reports drinking prune juice, taking dulcolax suppositories, and colace for constipation.  Patient denies recent fever, chills but states she had a fever while admitted after her surgery and 24 hours after returning home after her surgery.  She denies being placed on antibiotics.  Patient denies nausea, vomiting, suprapubic abdominal pain, vaginal discharge.   Past Medical History  Diagnosis Date  . Ulcerative colitis   . Allergy     year-round, pt. states  . Hypothyroidism   . GERD (gastroesophageal reflux disease)   . Chronic lower back pain   . Depression   . Complication of anesthesia     slow to wake up after gallbladder surgery  . Migraines   . RA (rheumatoid arthritis)   . Osteoarthritis   . Overactive bladder   . History of thyroid cancer   . 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 Sleep apnea    no CPAP use  . Eczema     ARMS AND HANDS   Current Outpatient Prescriptions on File Prior to Visit  Medication Sig Dispense Refill  . Calcium Carbonate-Vit D-Min (CALCIUM 600 + MINERALS PO) Take 2 tablets by mouth 2 (two) times daily.      . Cholecalciferol (VITAMIN D3) 2000 UNITS capsule Take 2,000 Units by mouth daily.      Marland Kitchen desvenlafaxine (PRISTIQ) 100 MG 24 hr tablet Take 100 mg by mouth every morning.      . diazepam (VALIUM) 10 MG tablet Take 1 tablet (10 mg total) by mouth every 6 (six) hours as needed for anxiety.  40 tablet  0  . esomeprazole (NEXIUM) 40 MG packet Take 40 mg by mouth daily before breakfast.      . gabapentin (NEURONTIN) 300 MG capsule Take 300 mg by mouth 2 (two) times daily.      Marland Kitchen HYDROmorphone (DILAUDID) 2 MG tablet Take 1-2 tablets (2-4 mg total) by mouth every 4 (four) hours as needed for severe pain.  50 tablet  0  . magnesium oxide (MAG-OX) 400 MG tablet Take 400 mg by mouth daily.      . mesalamine (LIALDA) 1.2 G EC tablet Take 2.4 g by mouth 2 (two) times daily.       . Omega-3 Fatty Acids (FISH OIL) 1000 MG CAPS Take 3,000  mg by mouth daily.      Marland Kitchen oxybutynin (DITROPAN-XL) 10 MG 24 hr tablet Take 10 mg by mouth daily.      . Probiotic Product (PROBIOTIC DAILY PO) Take 1 tablet by mouth daily.       . SUMAtriptan (IMITREX) 100 MG tablet Take 100 mg by mouth every 2 (two) hours as needed for migraine or headache. May repeat in 2 hours if headache persists or recurs.      Marland Kitchen thyroid (ARMOUR) 15 MG tablet Take 15 mg by mouth daily. Take with 90 mg for a total of 105 mg.      . thyroid (ARMOUR) 90 MG tablet Take 90 mg by mouth daily. Take with 15 mg for a total of 105 mg.      . zolpidem (AMBIEN CR) 12.5 MG CR tablet Take 12.5 mg by mouth at bedtime.      Marland Kitchen loperamide (IMODIUM A-D) 2 MG tablet Take 2 mg by mouth 4 (four) times daily as needed for diarrhea or loose stools.       No current facility-administered medications on file prior to visit.   Allergies    Allergen Reactions  . Codeine Nausea And Vomiting  . Hydrocodone Nausea And Vomiting  . Ibuprofen Other (See Comments)    MIGRAINES  . Iodinated Diagnostic Agents Hives and Itching  . Methotrexate Derivatives Other (See Comments)    GI UPSET  . Oxycodone Nausea And Vomiting  . Tylenol [Acetaminophen] Other (See Comments)    MIGRAINES  . Neosporin [Neomycin-Bacitracin Zn-Polymyx] Swelling    AT SITE  . Sulfa Antibiotics Other (See Comments)    UNKNOWN      Review of Systems  Constitutional: Negative for fever and chills.  Gastrointestinal: Negative for nausea, vomiting and abdominal pain.  Genitourinary: Positive for urgency and frequency. Negative for vaginal discharge.  Musculoskeletal: Positive for back pain.       Objective:  BP 138/90  Pulse 81  Temp(Src) 98.1 F (36.7 C) (Oral)  Resp 18  Wt 188 lb (85.276 kg)  SpO2 96%  Physical Exam  Nursing note and vitals reviewed. Constitutional: She is oriented to person, place, and time. She appears well-developed and well-nourished. No distress.  Patient appears uncomfortable and is in full back brace that covers her chest and abdomen.    HENT:  Head: Normocephalic and atraumatic.  Eyes: Conjunctivae and EOM are normal.  Neck: Neck supple. No tracheal deviation present.  Cardiovascular: Normal rate.   Pulmonary/Chest: Effort normal. No respiratory distress.  Musculoskeletal: Normal range of motion.  Neurological: She is alert and oriented to person, place, and time.  Skin: Skin is warm and dry.  Psychiatric: She has a normal mood and affect. Her behavior is normal.    Results for orders placed in visit on 03/17/14  POCT URINALYSIS DIPSTICK      Result Value Ref Range   Color, UA bright yellow     Clarity, UA clear     Glucose, UA neg     Bilirubin, UA neg     Ketones, UA neg     Spec Grav, UA <=1.005     Blood, UA neg     pH, UA 5.5     Protein, UA neg     Urobilinogen, UA 0.2     Nitrite, UA neg      Leukocytes, UA Negative    POCT UA - MICROSCOPIC ONLY      Result Value Ref Range   WBC,  Ur, HPF, POC neg     RBC, urine, microscopic neg     Bacteria, U Microscopic neg     Mucus, UA positive     Epithelial cells, urine per micros neg     Crystals, Ur, HPF, POC neg     Casts, Ur, LPF, POC neg     Yeast, UA neg          Assessment & Plan:  7:46 PM  Dysuria - Plan: POCT urinalysis dipstick, POCT UA - Microscopic Only, Urine culture - sxs unlikely to be 2/2 UTI due to nml UA.  Suspect urinary frequency and leaking with position change is secondary to incomplete emptying and bladder obstruction with position change due to constipation. Try pyridium to see if it helps symptoms while UClx is P but will hold off on empiric antibiotic treatment unless sxs worsen.  Frequency - Plan: POCT urinalysis dipstick, POCT UA - Microscopic Only, Urine culture  Constipation due to opioid therapy - double mag supp and start bid miralax along w/ senokot S or dulcolax.  Bladder outlet obstruction   I personally performed the services described in this documentation, which was scribed in my presence. The recorded information has been reviewed and considered, and addended by me as needed.  Delman Cheadle, MD MPH

## 2014-03-19 ENCOUNTER — Other Ambulatory Visit: Payer: Self-pay

## 2014-03-19 LAB — URINE CULTURE
Colony Count: NO GROWTH
Organism ID, Bacteria: NO GROWTH

## 2014-03-19 MED ORDER — PHENAZOPYRIDINE HCL 100 MG PO TABS: 100.0000 mg | ORAL_TABLET | Freq: Three times a day (TID) | ORAL | Status: DC | PRN

## 2014-07-15 ENCOUNTER — Other Ambulatory Visit: Payer: Self-pay | Admitting: Orthopedic Surgery

## 2014-07-15 DIAGNOSIS — M542 Cervicalgia: Secondary | ICD-10-CM

## 2014-07-25 ENCOUNTER — Other Ambulatory Visit: Payer: Self-pay

## 2014-08-05 ENCOUNTER — Ambulatory Visit
Admission: RE | Admit: 2014-08-05 | Discharge: 2014-08-05 | Disposition: A | Discharge status: Home / Self Care | Payer: Medicare Other | Source: Ambulatory Visit | Attending: Orthopedic Surgery | Admitting: Orthopedic Surgery

## 2014-08-05 DIAGNOSIS — M542 Cervicalgia: Secondary | ICD-10-CM

## 2014-09-23 ENCOUNTER — Other Ambulatory Visit: Payer: Self-pay | Admitting: Orthopedic Surgery

## 2014-09-23 DIAGNOSIS — M25572 Pain in left ankle and joints of left foot: Secondary | ICD-10-CM

## 2014-10-07 ENCOUNTER — Ambulatory Visit
Admission: RE | Admit: 2014-10-07 | Discharge: 2014-10-07 | Disposition: A | Discharge status: Home / Self Care | Payer: Medicare Other | Source: Ambulatory Visit | Attending: Orthopedic Surgery | Admitting: Orthopedic Surgery

## 2014-10-07 DIAGNOSIS — M25572 Pain in left ankle and joints of left foot: Secondary | ICD-10-CM

## 2015-01-08 DIAGNOSIS — R7989 Other specified abnormal findings of blood chemistry: Secondary | ICD-10-CM | POA: Insufficient documentation

## 2015-02-18 DIAGNOSIS — M2550 Pain in unspecified joint: Secondary | ICD-10-CM | POA: Insufficient documentation

## 2015-08-27 ENCOUNTER — Ambulatory Visit: Payer: Self-pay | Admitting: Neurology

## 2015-09-09 ENCOUNTER — Encounter: Payer: Self-pay | Admitting: Neurology

## 2015-09-09 ENCOUNTER — Ambulatory Visit (INDEPENDENT_AMBULATORY_CARE_PROVIDER_SITE_OTHER): Payer: Medicare HMO | Admitting: Neurology

## 2015-09-09 VITALS — BP 160/82 | HR 78 | Resp 20 | Ht 66.5 in | Wt 174.0 lb

## 2015-09-09 DIAGNOSIS — G43011 Migraine without aura, intractable, with status migrainosus: Secondary | ICD-10-CM

## 2015-09-09 DIAGNOSIS — M05732 Rheumatoid arthritis with rheumatoid factor of left wrist without organ or systems involvement: Secondary | ICD-10-CM

## 2015-09-09 MED ORDER — ZONISAMIDE 25 MG PO CAPS: 25.0000 mg | ORAL_CAPSULE | Freq: Every day | ORAL | Status: DC

## 2015-09-09 NOTE — Patient Instructions (Signed)
Zonisamide capsules What is this medicine? ZONISAMIDE (zoe NIS a mide) is used to control partial seizures in adults with epilepsy. This medicine may be used for other purposes; ask your health care provider or pharmacist if you have questions. What should I tell my health care provider before I take this medicine? They need to know if you have any of these conditions: -dehydrated -diarrhea -history of metabolic acidosis (too much acid in your blood) -ketogenic diet -kidney disease -liver disease -lung disease -osteoporosis -suicidal thoughts, plans, or attempt; a previous suicide attempt by you or a family member -an unusual or allergic reaction to zonisamide, sulfa drugs, other medicines, foods, dyes, or preservatives -pregnant or trying to get pregnant -breast-feeding How should I use this medicine? Take this medicine by mouth with a glass of water. Follow the directions on the prescription label. Swallow whole. Do not break open the capsule. This medicine may be taken with or without food. Take your doses at regular intervals. Do not take your medicine more often than directed. Do not stop taking this medicine unless instructed by your doctor or health care professional. A special MedGuide will be given to you by the pharmacist with each prescription and refill. Be sure to read this information carefully each time. Talk to your pediatrician regarding the use of this medicine in children. While this drug may be prescribed for children as young as 58 years of age for selected conditions, precautions do apply. Overdosage: If you think you have taken too much of this medicine contact a poison control center or emergency room at once. NOTE: This medicine is only for you. Do not share this medicine with others. What if I miss a dose? If you miss a dose, take it as soon as you can. If it is almost time for your next dose, take only that dose. Do not take double or extra doses. What may  interact with this medicine? -barbiturates like phenobarbital -carbamazepine -phenytoin This list may not describe all possible interactions. Give your health care provider a list of all the medicines, herbs, non-prescription drugs, or dietary supplements you use. Also tell them if you smoke, drink alcohol, or use illegal drugs. Some items may interact with your medicine. What should I watch for while using this medicine? Visit your doctor or health care professional for regular checks on your progress. Wear a medical identification bracelet or chain to say you have epilepsy, and carry a card that lists all your medications. It is important to take this medicine exactly as directed. When first starting treatment, your dose will need to be adjusted slowly. It may take weeks or months before your dose is stable. You should contact your doctor or health care professional if your seizures get worse or if you have any new types of seizures. Do not stop taking except on your doctor's advice. You may develop a severe reaction. Your doctor will tell you how much medicine to take. You may get drowsy, dizzy, or have blurred vision. Do not drive, use machinery, or do anything that needs mental alertness until you know how this medicine affects you. To reduce dizzy or fainting spells, do not sit or stand up quickly, especially if you are an older patient. Alcohol can increase drowsiness and dizziness. Avoid alcoholic drinks. Avoid extreme heat. This medicine can cause you to sweat less than normal. Your body temperature could increase to dangerous levels, which may lead to heat stroke. This medicine may increase the chance of developing metabolic  acidosis. If left untreated, this can cause kidney stones, bone disease, or slowed growth in children. Symptoms include breathing fast, fatigue, loss of appetite, irregular heartbeat, or loss of consciousness. Call your doctor immediately if you experience any of these side  effects. Also, tell your doctor about any surgery you plan on having while taking this medicine since this may increase your risk for metabolic acidosis. This medicines may increase the risk of kidney stones. Drinking 6 to 8 glasses of water a day may help prevent the formation of kidney stones. The use of this medicine may increase the chance of suicidal thoughts or actions. Pay special attention to how you are responding while on this medicine. Any worsening of mood, or thoughts of suicide or dying should be reported to your health care professional right away. Women who become pregnant while using this medicine may enroll in the [place] Pregnancy Registry by calling [phone]. This registry collects information about the safety of antiepileptic drug use during pregnancy. What side effects may I notice from receiving this medicine? Side effects that you should report to your doctor or health care professional immediately: -allergic reactions like skin rash, itching or hives, swelling of the face, lips, or tongue -decreased sweating or a rise in body temperature, especially in patients under 76 years old -difficulty breathing or tightening of the throat -feeling faint or lightheaded, falls -fever, sore throat, sores in your mouth, or bruising easily -hallucination, loss of contact with reality -irregular heartbeat -loss of appetite -redness, blistering, peeling or loosening of the skin, including inside the mouth -severe drowsiness, difficulty concentrating, or coordination problems -speech or language problems -sudden back pain, abdominal pain, pain when urinating, bloody or dark urine -suicidal thoughts or depression -unusual changes in behavior or mood -unusually weak or tired -vomiting Side effects that usually do not require medical attention (report to your doctor or health care professional if they continue or are bothersome): -headache -nausea This  list may not describe all possible side effects. Call your doctor for medical advice about side effects. You may report side effects to FDA at 1-800-FDA-1088. Where should I keep my medicine? Keep out of reach of children. Store at room temperature between 15 and 30 degrees C (59 and 86 degrees F). Keep in a dry place protected from light. Throw away any unused medicine after the expiration date. NOTE: This sheet is a summary. It may not cover all possible information. If you have questions about this medicine, talk to your doctor, pharmacist, or health care provider.    2016, [employer]. (2010-02-18 15:16:42)

## 2015-09-09 NOTE — Progress Notes (Signed)
[facility]   Provider:  Larey Seat, M D  Referring Provider: Willey Blade, MD Primary Care Physician:  Salena Saner., MD  Chief Complaint  Patient presents with  . New Patient (Initial Visit)    has 1-2 migraines per month, has daily migraines when she works out, wants to discuss treatments, she thinks imitrex causes reboud headaches, rm 11, alone  . Migraine    HPI:  Terry Shannon is a 60 y.o. female , seen here as a referral/from Dr. Karlton Lemon for migraine, mainly exercise induced.   Chief complaint according to patient : " Most bothering as the correlation of migraines sometimes lasting 3 days be triggered by exercise, especially upper body exercise"    I remember Terry Shannon I have seen her 12 years ago I think I have seen her last about 6 years ago, and this was for migraine treatment. She retired about 4 years ago. 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 She has responded to Imitrex on and off but she states that it seems to not work long enough or at times would not at all. In addition she Flexeril to control neck pain. She has noticed a strong correlation between exercise and migraine onset usually she exercises in daytime comes home and the migraine sets on within 2 or 3 hours later. Many of these migraines the last more than a day. The Imitrex is no longer helping as much. Trigger points seem always left paraspinal, occipital and into the temporal area, radiating above the eye. The patient had noted that sometimes taking Flexeril before exercising seems to help the headaches not to come on. She used to get migraines associated with rather significant nausea, but this is no longer the case. She has not vomited because of migraines in many years. She sees occasionally floaters or flickers but she has not noticed this as a strong correlation to an onset of migraine / AURA. Over a decade ago she was already  testing dietary triggers and learnt that she cannot drink red wine, aspartame or other sweeteners can cause her to have a migraine. She not longer tolerate Beer, but used to.  She has migraine at least 4 -5 times a month, 2 a week.  She has chronic insomnia.    The patient remembers that she was placed on gabapentin after back surgery this however also eliminated the trigger of exercise and she felt "okay while she was taking gabapentin. She has been on Depakote about a decade ago and also on topiramate. None of these have ever eliminated her headaches. She is also looking for potential trigger point injections, infusion or Botox therapy.  Social history:  Retired , lives with her partner- and her daughters and a daughters fiancee. No ETOH due to headaches, nausea.  No tobacco use, caffeine user-  coffee2 large mugs every morning.  Review of Systems: Out of a complete 14 system review, the patient complains of only the following symptoms, and all other reviewed systems are negative.   depression score 4    Social History   Social History  . Marital Status: Significant Other    Spouse Name: N/A  . Number of Children: N/A  . Years of Education: N/A   Occupational History  . Not on file.   Social History Main Topics  . Smoking status: Never Smoker   . Smokeless tobacco: Never Used  . Alcohol Use: 0.0 oz/week  Comment: 2-3 drinks/week  . Drug Use: No  . Sexual Activity: Not Currently    Birth Control/ Protection: None   Other Topics Concern  . Not on file   Social History Narrative    Family History  Problem Relation Age of Onset  . Stroke Mother   . Cancer Father 34    lung  . Cancer Maternal Grandmother 38    colon  . Stroke Maternal Grandmother   . Diabetes Brother   . Stroke Maternal Grandfather   . Diabetes Paternal Grandmother     Past Medical History  Diagnosis Date  . Ulcerative colitis ([place])   . Allergy     year-round, pt. states  . Hypothyroidism    . GERD (gastroesophageal reflux disease)   . Chronic lower back pain   . Depression   . Complication of anesthesia     slow to wake up after gallbladder surgery  . Migraines   . RA (rheumatoid arthritis) ([place])   . Osteoarthritis   . Overactive bladder   . History of thyroid cancer   . 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 Sleep apnea     no CPAP use  . Eczema     ARMS AND HANDS    Past Surgical History  Procedure Laterality Date  . Carpal tunnel release Right 07/13/2001  . Hammer toe surgery Left may 2014  . Tonsillectomy  1961  . Knee arthroscopy Right 2013  . 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 Thyroidectomy, partial Right prior to 2002  . Thyroidectomy, partial Left 11/29/2000    and isthmus  . Carpal tunnel release Left 08/10/2001  . Hysteroscopy w/d&c  03/01/2004    with exc. of endometrial polyp  . Bunionectomy with chilectomy Right 12/16/2005  . Bunionectomy Right     x 2 more  . Laparoscopic cholecystectomy  11/03/2006  . Abdominal hernia repair  [id number]    periumbilical ventral hernia/incisional hernia  . Bunionectomy Left   . Dilation and curettage of uterus    . Lumbar fusion  03/06/2014    l4  l5     Current Outpatient Prescriptions  Medication Sig Dispense Refill  . Cholecalciferol (VITAMIN D3) 2000 UNITS capsule Take 2,000 Units by mouth daily.    . cyclobenzaprine (FLEXERIL) 10 MG tablet Take 10 mg by mouth 3 (three) times daily as needed for muscle spasms.    Marland Kitchen desvenlafaxine (PRISTIQ) 100 MG 24 hr tablet Take 100 mg by mouth every morning.    . diazepam (VALIUM) 10 MG tablet Take 1 tablet (10 mg total) by mouth every 6 (six) hours as needed for anxiety. 40 tablet 0  . esomeprazole (NEXIUM) 40 MG packet Take 40 mg by mouth daily before breakfast.    . fenofibrate 160 MG tablet Take 160 mg by  mouth daily.    Marland Kitchen gabapentin (NEURONTIN) 300 MG capsule Take 300 mg by mouth 2 (two) times daily.    Marland Kitchen HYDROmorphone (DILAUDID) 2 MG tablet Take 1-2 tablets (2-4 mg total) by mouth every 4 (four) hours as needed for severe pain. 50 tablet 0  . loperamide (IMODIUM A-D) 2 MG tablet Take 2 mg by mouth 4 (four) times daily as needed for diarrhea or loose stools.    . magnesium oxide (MAG-OX) 400 MG tablet Take 400 mg by mouth daily.    Marland Kitchen  mesalamine (LIALDA) 1.2 G EC tablet Take 2.4 g by mouth 2 (two) times daily.     . Omega-3 Fatty Acids (FISH OIL) 1000 MG CAPS Take 3,000 mg by mouth daily.    Marland Kitchen oxybutynin (DITROPAN-XL) 10 MG 24 hr tablet Take 10 mg by mouth daily.    . phenazopyridine (PYRIDIUM) 100 MG tablet Take 1 tablet (100 mg total) by mouth 3 (three) times daily as needed for pain. 10 tablet 0  . Probiotic Product (PROBIOTIC DAILY PO) Take 1 tablet by mouth daily.     . SUMAtriptan (IMITREX) 100 MG tablet Take 100 mg by mouth every 2 (two) hours as needed for migraine or headache. May repeat in 2 hours if headache persists or recurs.    Marland Kitchen thyroid (ARMOUR) 15 MG tablet Take 15 mg by mouth daily. Take with 90 mg for a total of 105 mg.    . thyroid (ARMOUR) 30 MG tablet Take 30 mg by mouth daily before breakfast.    . thyroid (ARMOUR) 90 MG tablet Take 90 mg by mouth daily. Take with 15 mg for a total of 105 mg.    . zolpidem (AMBIEN CR) 12.5 MG CR tablet Take 12.5 mg by mouth at bedtime.     No current facility-administered medications for this visit.    Allergies as of 09/09/2015 - Review Complete 09/09/2015  Allergen Reaction Noted  . Codeine Nausea And Vomiting 09/09/2010  . Hydrocodone Nausea And Vomiting 08/08/2012  . Ibuprofen Other (See Comments) 08/08/2012  . Iodinated diagnostic agents Hives and Itching 09/16/2010  . Methotrexate derivatives Other (See Comments) 08/08/2012  . Oxycodone Nausea And Vomiting 04/01/2013  . Tylenol [acetaminophen] Other (See Comments) 08/08/2012  .  Neosporin [neomycin-bacitracin zn-polymyx] Swelling 07/18/2013  . Sulfa antibiotics Other (See Comments) 11/01/2011    Vitals: BP 160/82 mmHg  Pulse 78  Resp 20  Ht 5' 6.5" (1.689 m)  Wt 174 lb (78.926 kg)  BMI 27.67 kg/m2 Last Weight:  Wt Readings from Last 1 Encounters:  09/09/15 174 lb (78.926 kg)   [id number] mass index is 27.67 kg/(m^2).     Last Height:   Ht Readings from Last 1 Encounters:  09/09/15 5' 6.5" (1.689 m)    Physical exam:  General: The patient is awake, alert and appears not in acute distress. The patient is well groomed. Head: Normocephalic, atraumatic. Neck is supple. Mallampati 1,  neck circumference:14. Nasal airflow urestricted , TMJ is not  evident . Retrognathia is seen.  Cardiovascular:  Regular rate and rhythm , without  murmurs or carotid bruit, and without distended neck veins. Respiratory: Lungs are clear to auscultation. Skin:  Without evidence of edema, or rash Trunk: truncal obesity Neurologic exam : The patient is awake and alert, oriented to place and time.   Memory subjective described as intact.  Attention span & concentration ability appears normal.  Speech is fluent,  without  dysarthria, dysphonia or aphasia.  Mood and affect are appropriate.  Cranial nerves: Pupils are equal and briskly reactive to light. Funduscopic exam without evidence of pallor or edema.  Extraocular movements  in vertical and horizontal planes intact and without nystagmus. Visual fields by finger perimetry are intact. Hearing to finger rub intact. Facial sensation intact to fine touch. Facial motor strength is symmetric and tongue and uvula move midline. Shoulder shrug was symmetrical.   Motor exam: Normal tone, muscle bulk and symmetric strength in all extremities. She has restricted ROm in fingers and feet. Rheumatoid arthritis / deforming.  Sensory:  Fine touch, pinprick and vibration were  normal. Coordination: Finger-to-nose maneuver  normal without  evidence of ataxia, dysmetria or tremor.  Gait and station: Patient walks without assistive device and is able unassisted to climb up to the exam table. Strength within normal limits.  Stance is stable and normal.   Deep tendon reflexes: in the  upper  extremities are symmetric and intact.  Status post knee replecement. Babinski maneuver response is downgoing.  The patient was advised of the nature of the diagnosed sleep disorder , the treatment options and risks for general a health and wellness arising from not treating the condition.  I spent more than 45 minutes of face to face time with the patient. Greater than 50% of time was spent in counseling and coordination of care. We have discussed the diagnosis and differential and I answered the patient's questions.     Assessment:  After physical and neurologic examination, review of laboratory studies,  Personal review of imaging studies, reports of other /same  Imaging studies ,  Results of polysomnography/ neurophysiology testing and pre-existing records as far as provided in visit., my assessment is   1) Terry Shannon has been suffering from migraines for over 2 decades, her migraines have not changed significantly with menopause. She also has a comorbidity of rheumatoid arthritis, which gives her neck stiffness hand stiffness and range of motion deficits and affects mostly her feet. She has undergone multiple orthopedic surgeries. Her migraine trigger point has always been the left paracervical paraspinal area radiating from the occipital notch towards the left temple and retro-orbital. I can imagine that Botox injections may actually help her, but I also no that Medicare usually discourages this use. I would like to have [name] seen in a second opinion by my colleague Dr. Sarina Ill, for possible trigger point injections. She had failed to respond to Depakote infusions and had tried multiple trip times as well as medication that I listed  above. She has actually more food sensitivities now than she had a decade ago. I would like to try zonisamide as a migraine prophylaxis and since she no longer responds well to the oral Imitrex dosing I would like to try Relpax or Frova -preventing  a resurgence of the headache after initial alleviation.  2) neck and spine massage - helps in the moment- not for migraine, integrative Therapies.   3) insomnia- not a major problem.     Plan:  Treatment plan and additional workup :  Zonisamide,  Headache  Journal.  Request second opinion from Dr. Jaynee Eagles.    Asencion Partridge Amzie Sillas MD  09/09/2015   CC: Willey Blade, Md [address] [place], [place] [address]

## 2015-09-15 ENCOUNTER — Encounter: Payer: Self-pay | Admitting: *Deleted

## 2015-09-24 ENCOUNTER — Encounter: Payer: Self-pay | Admitting: Neurology

## 2015-09-24 ENCOUNTER — Ambulatory Visit (INDEPENDENT_AMBULATORY_CARE_PROVIDER_SITE_OTHER): Payer: Medicare HMO | Admitting: Neurology

## 2015-09-24 VITALS — BP 152/88 | HR 62 | Ht 66.5 in | Wt 179.6 lb

## 2015-09-24 DIAGNOSIS — G43711 Chronic migraine without aura, intractable, with status migrainosus: Secondary | ICD-10-CM | POA: Diagnosis not present

## 2015-09-24 MED ORDER — METOCLOPRAMIDE HCL 10 MG PO TABS: 10.0000 mg | ORAL_TABLET | Freq: Three times a day (TID) | ORAL | Status: DC | PRN

## 2015-09-24 MED ORDER — PROPRANOLOL HCL 10 MG PO TABS: 10.0000 mg | ORAL_TABLET | Freq: Two times a day (BID) | ORAL | Status: DC

## 2015-09-24 MED ORDER — ZONISAMIDE 25 MG PO CAPS: 100.0000 mg | ORAL_CAPSULE | Freq: Every day | ORAL | Status: DC

## 2015-09-24 NOTE — Progress Notes (Signed)
[facility]    Provider:  Dr Jaynee Eagles Referring Provider: Willey Blade, MD Primary Care Physician:  Salena Saner., MD  CC:  Migraine  HPI:  Terry Shannon is a 60 y.o. female here as a second opinion from my colleague Dr. Brett Fairy  for migraines. She has had the migraines since the age of 37. Certain foods and drinks are triggers and she has taken those out of her diet, like wine and aspertame. The headaches start in the back of her neck, it gets tight then spreads to the left side of her head. It is poinding, throbbing, pain behind the eye temple pounding, +light sensitivity, +sound sensitivity, + nausea, no vomiting in over 10 years. Another trigger is exercise. When she exercise she will get a migraine that day. No medicaton overuse headache. Has been going on for years at this frequency. She has 16 migraines a month, they can last for 1-3 days, on average they are 8/10 in severity. They started years ago after a myelogram. No FHx of migraines. She was recently started on Zonegran by Dr. Roddie Mc which she feels may be helping. Reports that she has had normal brain imaging in the past.  Tried: Topamax, amitriptyline, propranolol and she has tried a lot that she can't remember and was on these for years, stopped working.Taking Zonegran now. She uses flexeril and an aspirin now on onset which seems to help a little. Imitrex used to help but stopped helping. She tried zomig and mazalt and relpax.  Has contraindication to NSAIDs but still takes aspirin as this is the only thing that appears to help acutely.   Reviewed notes, labs and imaging from outside physicians, which showed: Personally reviewed images and agree with the following  Vertebral Arteries: BILATERAL patent, LEFT dominant.  Paraspinal tissues: Paravertebral anterior soft tissues are obscured by saturation bands. No visible adenopathy.  Disc levels:  The individual disc spaces were examined as  follows:  C2-3: Normal disc space. Trace anterolisthesis. Severe left-sided facet arthropathy. No foraminal narrowing.  C3-4: Moderately severe disc space narrowing. Fusion across the facet joints. No impingement.  C4-5: Central disc osteophyte complex. Trace anterolisthesis. No impingement.  C5-6: Moderate disc space narrowing. Asymmetric foraminal narrowing on the RIGHT due to uncinate spurring. RIGHT C6 nerve root impingement not excluded.  C6-7: Central disc osteophyte complex. No stenosis or foraminal narrowing.  C7-T1: 3 mm anterolisthesis is facet mediated. Advanced facet arthropathy without impingement.  IMPRESSION: Multilevel spondylosis as described. No significant left-sided foraminal narrowing or leftward disc protrusion.  Asymmetric facet arthropathy on the LEFT is most notable at C2-3. Correlate clinically for facet mediated upper extremity symptoms.  Review of Systems: Patient complains of symptoms per HPI as well as the following symptoms: Weight gain, feeling hot, hearing loss, incontinence, joint pain, joint swelling, aching muscles, urination problems, incontinence, allergies, runny nose, memory loss, headache, insomnia, snoring, anxiety. Pertinent negatives per HPI. All others negative.   Social History   Social History  . Marital Status: Significant Other    Spouse Name: N/A  . Number of Children: N/A  . Years of Education: N/A   Occupational History  . Not on file.   Social History Main Topics  . Smoking status: Never Smoker   . Smokeless tobacco: Never Used  . Alcohol Use: 0.0 oz/week     Comment: 2-3 drinks/week  . Drug Use: No  . Sexual Activity: Not Currently    Birth Control/ Protection: None   Other Topics Concern  . Not  on file   Social History Narrative    Family History  Problem Relation Age of Onset  . Stroke Mother   . Cancer Father 91    lung  . Cancer Maternal Grandmother 43    colon  . Stroke Maternal Grandmother   .  Diabetes Brother   . Stroke Maternal Grandfather   . Diabetes Paternal Grandmother     Past Medical History  Diagnosis Date  . Ulcerative colitis ([place])   . Allergy     year-round, pt. states  . Hypothyroidism   . GERD (gastroesophageal reflux disease)   . Chronic lower back pain   . Depression   . Complication of anesthesia     slow to wake up after gallbladder surgery  . Migraines   . RA (rheumatoid arthritis) ([place])   . Osteoarthritis   . Overactive bladder   . History of thyroid cancer   . 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 Sleep apnea     no CPAP use  . Eczema     ARMS AND HANDS    Past Surgical History  Procedure Laterality Date  . Carpal tunnel release Right 07/13/2001  . Hammer toe surgery Left may 2014  . Tonsillectomy  1961  . Knee arthroscopy Right 2013  . Total knee arthroplasty Right 04/01/2013    Procedure: RIGHT TOTAL KNEE ARTHROPLASTY;  Surgeon: Alta Corning, MD;  Location: [place];  Service: Orthopedics;  Laterality: Right;  . Thyroidectomy, partial Right prior to 2002  . Thyroidectomy, partial Left 11/29/2000    and isthmus  . Carpal tunnel release Left 08/10/2001  . Hysteroscopy w/d&c  03/01/2004    with exc. of endometrial polyp  . Bunionectomy with chilectomy Right 12/16/2005  . Bunionectomy Right     x 2 more  . Laparoscopic cholecystectomy  11/03/2006  . Abdominal hernia repair  123456    periumbilical ventral hernia/incisional hernia  . Bunionectomy Left   . Dilation and curettage of uterus    . Lumbar fusion  03/06/2014    l4  l5     Current Outpatient Prescriptions  Medication Sig Dispense Refill  . Cholecalciferol (VITAMIN D3) 2000 UNITS capsule Take 2,000 Units by mouth daily.    . cyclobenzaprine (FLEXERIL) 10 MG tablet Take 10 mg by mouth 3 (three) times daily as needed for muscle spasms.    Marland Kitchen desvenlafaxine (PRISTIQ) 100 MG 24 hr tablet Take 100 mg by mouth every morning.    Marland Kitchen esomeprazole (NEXIUM) 40 MG packet  Take 40 mg by mouth daily before breakfast.    . gabapentin (NEURONTIN) 300 MG capsule Take 300 mg by mouth 2 (two) times daily.    . magnesium oxide (MAG-OX) 400 MG tablet Take 400 mg by mouth daily.    . mesalamine (LIALDA) 1.2 G EC tablet Take 2.4 g by mouth 2 (two) times daily.     . Omega-3 Fatty Acids (FISH OIL) 1000 MG CAPS Take 3,000 mg by mouth daily.    Marland Kitchen oxybutynin (DITROPAN-XL) 10 MG 24 hr tablet Take 10 mg by mouth daily.    . Probiotic Product (PROBIOTIC DAILY PO) Take 1 tablet by mouth daily.     Marland Kitchen thyroid (ARMOUR) 15 MG tablet Take 15 mg by mouth daily. Take with 90 mg for a total of 105 mg.    . thyroid (ARMOUR) 30 MG tablet Take 30 mg by mouth daily before breakfast.    . thyroid (ARMOUR) 90 MG tablet  Take 90 mg by mouth daily. Take with 15 mg for a total of 105 mg.    . zonisamide (ZONEGRAN) 25 MG capsule Take 1 capsule (25 mg total) by mouth daily. 60 capsule 3  . SUMAtriptan (IMITREX) 100 MG tablet Take 100 mg by mouth every 2 (two) hours as needed for migraine or headache. Reported on 09/24/2015     No current facility-administered medications for this visit.    Allergies as of 09/24/2015 - Review Complete 09/09/2015  Allergen Reaction Noted  . Codeine Nausea And Vomiting 09/09/2010  . Hydrocodone Nausea And Vomiting 08/08/2012  . Ibuprofen Other (See Comments) 08/08/2012  . Iodinated diagnostic agents Hives and Itching 09/16/2010  . Methotrexate derivatives Other (See Comments) 08/08/2012  . Oxycodone Nausea And Vomiting 04/01/2013  . Tylenol [acetaminophen] Other (See Comments) 08/08/2012  . Neosporin [neomycin-bacitracin zn-polymyx] Swelling 07/18/2013  . Sulfa antibiotics Other (See Comments) 11/01/2011    Vitals: BP 152/88 mmHg  Pulse 62  Ht 5' 6.5" (1.689 m)  Wt 179 lb 9.6 oz (81.466 kg)  BMI 28.56 kg/m2 Last Weight:  Wt Readings from Last 1 Encounters:  09/24/15 179 lb 9.6 oz (81.466 kg)   Last Height:   Ht Readings from Last 1 Encounters:  09/24/15  5' 6.5" (1.689 m)   Physical exam: Exam: Gen: NAD, conversant, well nourised, obese, well groomed                     Eyes: Conjunctivae clear without exudates or hemorrhage  Neuro: Detailed Neurologic Exam  Speech:    Speech is normal; fluent and spontaneous with normal comprehension.  Cognition:    The patient is oriented to person, place, and time;     Cranial Nerves:    The pupils are equal, round, and reactive to light. Visual fields are full to finger confrontation. Extraocular movements are intact. Trigeminal sensation is intact and the muscles of mastication are normal. The face is symmetric. The palate elevates in the midline. Hearing intact. Voice is normal. Shoulder shrug is normal. The tongue has normal motion without fasciculations.   Gait:    Heel-toe and tandem gait are normal.   Motor Observation:    No asymmetry, no atrophy, and no involuntary movements noted. Tone:    Normal muscle tone.    Posture:    Posture is normal. normal erect    Strength:    Strength is V/V in the upper and lower limbs.        Assessment/Plan:  60 year old female with chronic migraines, intractable.  - For her exercise-induced headaches as well as migraine prevention: Start propranolol 10mg  twice a day. Can further increase as needed. Common propranolol side effects may include:nausea, vomiting, diarrhea, constipation, stomach cramps; decreased sex drive, impotence, or difficulty having an orgasm;sleep problems (insomnia); or tired feeling. Do NOT get pregnant on this medication. Stop immediately for: slow or uneven heartbeats;a light-headed feeling, like you might pass out;wheezing or trouble breathing;shortness of breath (even with mild exertion), swelling, rapid weight gain;.sudden weakness, vision problems, or loss of coordination (especially in a child with hemangioma that affects the face or head);cold feeling in your hands and feet;depression, confusion, hallucinations;liver  problems - nausea, upper stomach pain, itching, tired feeling, loss of appetite, dark urine, clay-colored stools, jaundice (yellowing of the skin or eyes);low blood sugar - headache, hunger, weakness, sweating, confusion, irritability, dizziness, fast heart rate, or feeling jittery;low blood sugar in a baby - pale skin, blue or purple skin, sweating, fussiness, crying,  not wanting to eat, feeling cold, drowsiness, weak or shallow breathing (breathing may stop for short periods), seizure (convulsions), or loss of consciousness; or severe skin reaction - fever, sore throat, swelling in your face or tongue, burning in your eyes, skin pain, followed by a red or purple skin rash that spreads (especially in the face or upper body) and causes blistering and peeling. Do not take it blood pressure is less than 110/60 or pulse is less than 60.  - Increase zonisamide to 100 mg each evening, may further need increased to 200 mg needed. - Request botox for migraine - Trigger points Q weekly until increase in zonisamide or propranolol takes effect. - On onset of headaache, flexeril and reglan. Discussed Reglan: Adverse reactions can include extraparametal symptoms, dystonia, parkinsonism, tardive dyskinesias, neuroleptic malignant syndrome, seizures, depression, suicidality, hallucinations, leukopenia, neutropenia, a granulocytosis, CHF AV block, hypertension, liver disease. Common reactions including drowsiness, restlessness, fatigue, anxiety, insomnia, headache, confusion, dizziness, depression, extrapyramidal symptoms, galactorrhea, and menorrhea, gynecomastia, hyperprolactinemia, overall Doster and is on, fluid retention, hypertension, hypotension, bradycardia, nausea, diarrhea, incontinence, rash and possibly other side effects. Stop for anything concerning an call office.  To prevent or relieve headaches, try the following: Cool Compress. Lie down and place a cool compress on your head.  Avoid headache triggers. If  certain foods or odors seem to have triggered your migraines in the past, avoid them. A headache diary might help you identify triggers.  Include physical activity in your daily routine. Try a daily walk or other moderate aerobic exercise.  Manage stress. Find healthy ways to cope with the stressors, such as delegating tasks on your to-do list.  Practice relaxation techniques. Try deep breathing, yoga, massage and visualization.  Eat regularly. Eating regularly scheduled meals and maintaining a healthy diet might help prevent headaches. Also, drink plenty of fluids.  Follow a regular sleep schedule. Sleep deprivation might contribute to headaches Consider biofeedback. With this mind-body technique, you learn to control certain bodily functions - such as muscle tension, heart rate and blood pressure - to prevent headaches or reduce headache pain.    Proceed to emergency room if you experience new or worsening symptoms or symptoms do not resolve, if you have new neurologic symptoms or if headache is severe, or for any concerning symptom.     Sarina Ill, MD  [facility] [address] [place], [place] [address]  Phone [phone] Fax [phone]  A total of 40 minutes was spent face-to-face with this patient. Over half this time was spent on counseling patient on the migraine diagnosis and different diagnostic and therapeutic options available.

## 2015-09-24 NOTE — Patient Instructions (Signed)
Overall you are doing fairly well but I do want to suggest a few things today:   Remember to drink plenty of fluid, eat healthy meals and do not skip any meals. Try to eat protein with a every meal and eat a healthy snack such as fruit or nuts in between meals. Try to keep a regular sleep-wake schedule and try to exercise daily, particularly in the form of walking, 20-30 minutes a day, if you can.   As far as your medications are concerned, I would like to suggest:  Propranolol 10mg  twice daily. Watch blood pressure and pulse. Do not take if blood pressure < 110/60 or pulse < 60 Every week increase Zonisamide by 25mg  until you reach 100mg  each evening Discussed botox Trigger point injections At onset of headache, flexeril and reglan.   I would like to see you back for trigger point injections, sooner if we need to. Please call us with any interim questions, concerns, problems, updates or refill requests.   Our phone number is [phone]. We also have an after hours call service for urgent matters and there is a physician on-call for urgent questions. For any emergencies you know to call 911 or go to the nearest emergency room

## 2015-09-26 DIAGNOSIS — G43711 Chronic migraine without aura, intractable, with status migrainosus: Secondary | ICD-10-CM | POA: Insufficient documentation

## 2015-09-29 ENCOUNTER — Ambulatory Visit (INDEPENDENT_AMBULATORY_CARE_PROVIDER_SITE_OTHER): Payer: Medicare HMO | Admitting: Neurology

## 2015-09-29 VITALS — BP 137/85 | HR 51 | Ht 66.5 in | Wt 181.0 lb

## 2015-09-29 DIAGNOSIS — G43011 Migraine without aura, intractable, with status migrainosus: Secondary | ICD-10-CM

## 2015-09-29 DIAGNOSIS — M542 Cervicalgia: Secondary | ICD-10-CM

## 2015-09-29 NOTE — Progress Notes (Signed)
    NERVE BLOCK PROCEDURE NOTE  Procedure: Patient was consented for bilateral occipital, bilateral trigeminal nerve blocks and cervical muscle trigger point injections. A solution containing ingredients below were placed in 3 3-CC syringes with 30 gauge 1/2 inch needle.   20 Target areas in the occipital, suboccipital, temporal regions and cervical muscles were identified via palpation and pain response.The sites junctions were sterilized with alcohol wipes. The contents of each syringe was injected in a fanlike fashion. Patient tolerated the procedure well and no complications were noted.   Consent was provided below and patient acknowledged understanding:   Lidocaine 2%- 98mL total  Lot: [id number]  Expiration 03/2019  NDC: [id number]   Marcaine 0.5%- 31mL total  Lot: [id number]  Expiration: 12/21/2016  NDC: [id number]     What to expect afterwards?  Immediately after the injection, the back of your head may feel warm and numb. You may also experience reduction in the pain. The local anaesthetic wears off in a few hours.  The pain relief is vary variable and can last from a few days to several months. Some patients do not experience any pain relief. Hence it is difficult to predict the outcome of the injection treatment in a particular patient.   There may be some discomfort at the injection site for a couple of days after treatment, however, this should settle quite quickly. We advise you to take things easy for the rest of the day. Continue taking your pain medication as advised by your consultant or until you feel benefit from the treatment.   What are the side effects / complications?  Common   Soreness / bruising at the injection site.   Temporary increase (up to 7 days) in pain following procedure.   Rare   Bleeding   Infection at the injection site   Allergic reaction   New pain   Worsening pain

## 2015-09-29 NOTE — Progress Notes (Addendum)
Nerve Block: Lidocaine 2%- 91mL total Lot: [id number] Expiration 03/2019 NDC: [id number]  Marcaine 0.5%- 84mL total Lot: [id number] Expiration: 12/21/2016 NDC: [id number]

## 2015-10-08 ENCOUNTER — Ambulatory Visit: Payer: Medicare HMO | Admitting: Neurology

## 2015-11-30 ENCOUNTER — Telehealth: Payer: Self-pay | Admitting: Neurology

## 2015-11-30 NOTE — Telephone Encounter (Signed)
Pt called sts she rec'd a letter from [facility] and they will pay for Botox under the part B(she thinks it was part B).  Please call to schedule botox

## 2015-12-09 ENCOUNTER — Ambulatory Visit: Payer: Medicare HMO | Admitting: Neurology

## 2015-12-23 NOTE — Telephone Encounter (Signed)
Spoke with patient she is going to call back with information regarding the letter.

## 2016-01-19 ENCOUNTER — Other Ambulatory Visit: Payer: Self-pay | Admitting: Internal Medicine

## 2016-01-19 DIAGNOSIS — Z1231 Encounter for screening mammogram for malignant neoplasm of breast: Secondary | ICD-10-CM

## 2016-01-19 DIAGNOSIS — E2839 Other primary ovarian failure: Secondary | ICD-10-CM

## 2016-02-02 ENCOUNTER — Ambulatory Visit
Admission: RE | Admit: 2016-02-02 | Discharge: 2016-02-02 | Disposition: A | Discharge status: Home / Self Care | Payer: Medicare HMO | Source: Ambulatory Visit | Attending: Internal Medicine | Admitting: Internal Medicine

## 2016-02-02 DIAGNOSIS — E2839 Other primary ovarian failure: Secondary | ICD-10-CM

## 2016-02-02 DIAGNOSIS — Z1231 Encounter for screening mammogram for malignant neoplasm of breast: Secondary | ICD-10-CM

## 2016-03-30 ENCOUNTER — Other Ambulatory Visit: Payer: Self-pay | Admitting: Neurology

## 2016-05-02 ENCOUNTER — Ambulatory Visit (HOSPITAL_COMMUNITY)
Admission: EM | Admit: 2016-05-02 | Discharge: 2016-05-02 | Disposition: A | Discharge status: Home / Self Care | Payer: Medicare HMO | Attending: Family Medicine | Admitting: Family Medicine

## 2016-05-02 ENCOUNTER — Encounter (HOSPITAL_COMMUNITY): Payer: Self-pay | Admitting: Emergency Medicine

## 2016-05-02 DIAGNOSIS — S0501XS Injury of conjunctiva and corneal abrasion without foreign body, right eye, sequela: Secondary | ICD-10-CM

## 2016-05-02 DIAGNOSIS — S0501XA Injury of conjunctiva and corneal abrasion without foreign body, right eye, initial encounter: Secondary | ICD-10-CM | POA: Diagnosis not present

## 2016-05-02 MED ORDER — TETRACAINE HCL 0.5 % OP SOLN
OPHTHALMIC | Status: AC
  Filled 2016-05-02: qty 2

## 2016-05-02 MED ORDER — FLUORESCEIN SODIUM 0.6 MG OP STRP
ORAL_STRIP | OPHTHALMIC | Status: AC
  Filled 2016-05-02: qty 1

## 2016-05-02 MED ORDER — TOBRAMYCIN 0.3 % OP OINT
TOPICAL_OINTMENT | OPHTHALMIC | Status: AC
  Filled 2016-05-02: qty 3.5

## 2016-05-02 NOTE — ED Provider Notes (Signed)
CSN: [id number]     Arrival date & time 05/02/16  1645 History   First MD Initiated Contact with Patient 05/02/16 1717     Chief Complaint  Patient presents with  . Eye Pain   (Consider location/radiation/quality/duration/timing/severity/associated sxs/prior Treatment) Patient injured her right eye and has discomfort.  She denies any changes in her visual acuity at present.   The history is provided by the patient.  Eye Pain  This is a new problem. The current episode started 3 to 5 hours ago. The problem occurs constantly. The problem has not changed since onset.Nothing aggravates the symptoms. Nothing relieves the symptoms.    Past Medical History:  Diagnosis Date  . Allergy    year-round, pt. states  . Chronic lower back pain   . 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 Complication of anesthesia    slow to wake up after gallbladder surgery  . Depression   . Eczema    ARMS AND HANDS  . GERD (gastroesophageal reflux disease)   . History of thyroid cancer   . Hypothyroidism   . Migraines   . Osteoarthritis   . Overactive bladder   . RA (rheumatoid arthritis) ([place])   . Sleep apnea    no CPAP use  . Ulcerative colitis [facility])    Past Surgical History:  Procedure Laterality Date  . ABDOMINAL HERNIA REPAIR  123456   periumbilical ventral hernia/incisional hernia  . BUNIONECTOMY Right    x 2 more  . BUNIONECTOMY Left   . BUNIONECTOMY WITH CHILECTOMY Right 12/16/2005  . CARPAL TUNNEL RELEASE Right 07/13/2001  . CARPAL TUNNEL RELEASE Left 08/10/2001  . DILATION AND CURETTAGE OF UTERUS    . HAMMER TOE SURGERY Left may 2014  . HYSTEROSCOPY W/D&C  03/01/2004   with exc. of endometrial polyp  . KNEE ARTHROSCOPY Right 2013  . LAPAROSCOPIC CHOLECYSTECTOMY  11/03/2006  . LUMBAR FUSION  03/06/2014   l4  l5   . THYROIDECTOMY, PARTIAL Right prior to 2002  . THYROIDECTOMY, PARTIAL Left 11/29/2000   and isthmus  . TONSILLECTOMY  1961  . TOTAL KNEE ARTHROPLASTY  Right 04/01/2013   Procedure: RIGHT TOTAL KNEE ARTHROPLASTY;  Surgeon: Alta Corning, MD;  Location: [place];  Service: Orthopedics;  Laterality: Right;   Family History  Problem Relation Age of Onset  . Stroke Mother   . Cancer Father 61    lung  . Diabetes Brother   . Cancer Maternal Grandmother 47    colon  . Stroke Maternal Grandmother   . Stroke Maternal Grandfather   . Diabetes Paternal Grandmother    Social History  Substance Use Topics  . Smoking status: Never Smoker  . Smokeless tobacco: Never Used  . Alcohol use 0.0 oz/week     Comment: 2-3 drinks/week   OB History    Gravida Para Term Preterm AB Living   0             SAB TAB Ectopic Multiple Live Births                 Review of Systems  Constitutional: Negative.   HENT: Negative.   Eyes: Positive for pain.  Respiratory: Negative.   Cardiovascular: Negative.   Gastrointestinal: Negative.   Endocrine: Negative.   Genitourinary: Negative.   Musculoskeletal: Negative.   Skin: Negative.   Allergic/Immunologic: Negative.   Neurological: Negative.   Hematological: Negative.     Allergies  Codeine; Hydrocodone; Ibuprofen; Iodinated diagnostic agents; Methotrexate derivatives;  Oxycodone; Tylenol [acetaminophen]; Neosporin [neomycin-bacitracin zn-polymyx]; and Sulfa antibiotics  Home Medications   Prior to Admission medications   Medication Sig Start Date End Date Taking? Authorizing Provider  Zolpidem Tartrate (AMBIEN PO) Take by mouth.   Yes Historical Provider, MD  Cholecalciferol (VITAMIN D3) 2000 UNITS capsule Take 2,000 Units by mouth daily.    Historical Provider, MD  cyclobenzaprine (FLEXERIL) 10 MG tablet Take 10 mg by mouth 3 (three) times daily as needed for muscle spasms.    Historical Provider, MD  desvenlafaxine (PRISTIQ) 100 MG 24 hr tablet Take 100 mg by mouth every morning.    Historical Provider, MD  esomeprazole (NEXIUM) 40 MG packet Take 40 mg by mouth daily before breakfast.    Historical  Provider, MD  magnesium oxide (MAG-OX) 400 MG tablet Take 400 mg by mouth daily.    Historical Provider, MD  mesalamine (LIALDA) 1.2 G EC tablet Take 2.4 g by mouth 2 (two) times daily.     Historical Provider, MD  metoCLOPramide (REGLAN) 10 MG tablet Take 1 tablet (10 mg total) by mouth every 8 (eight) hours as needed for nausea. 09/24/15   Melvenia Beam, MD  Omega-3 Fatty Acids (FISH OIL) 1000 MG CAPS Take 3,000 mg by mouth daily.    Historical Provider, MD  oxybutynin (DITROPAN-XL) 10 MG 24 hr tablet Take 10 mg by mouth daily.    Historical Provider, MD  Probiotic Product (PROBIOTIC DAILY PO) Take 1 tablet by mouth daily.     Historical Provider, MD  propranolol (INDERAL) 10 MG tablet Take 1 tablet (10 mg total) by mouth 2 (two) times daily. 09/24/15   Melvenia Beam, MD  thyroid (ARMOUR) 15 MG tablet Take 15 mg by mouth daily. Take with 90 mg for a total of 105 mg.    Historical Provider, MD  thyroid (ARMOUR) 30 MG tablet Take 30 mg by mouth daily before breakfast.    Historical Provider, MD  thyroid (ARMOUR) 90 MG tablet Take 90 mg by mouth daily. Take with 15 mg for a total of 105 mg.    Historical Provider, MD  zonisamide (ZONEGRAN) 25 MG capsule TAKE 4 CAPSULES BY MOUTH DAILY. 03/30/16   Melvenia Beam, MD   Meds Ordered and Administered this Visit  Medications - No data to display  BP 152/85 (BP Location: Right Arm)   Pulse 62   Temp 97.3 F (36.3 C) (Oral)   Resp 18   SpO2 96%  No data found.   Physical Exam  Constitutional: She is oriented to person, place, and time. She appears well-developed and well-nourished.  HENT:  Head: Normocephalic.  Right Ear: External ear normal.  Left Ear: External ear normal.  Mouth/Throat: Oropharynx is clear and moist.  Eyes: EOM are normal. Pupils are equal, round, and reactive to light.  Right eye conjunctiva and right eye sclera injected.  Iris wnl and PERRLA bilateral.  Right eye with tetracaine and then flourescein eye stain applied and  flourescent light reveals corneal abrasion at 12 o clock position.   Neck: Normal range of motion. Neck supple.  Cardiovascular: Normal rate, regular rhythm and normal heart sounds.   Pulmonary/Chest: Effort normal and breath sounds normal.  Abdominal: Soft. Bowel sounds are normal.  Neurological: She is alert and oriented to person, place, and time.  Skin: Skin is warm and dry.  Nursing note and vitals reviewed.   Urgent Care Course   Clinical Course     Procedures (including critical care time)  Labs Review Labs Reviewed - No data to display  Imaging Review No results found.   Visual Acuity Review  Right Eye Distance:   Left Eye Distance:   Bilateral Distance:    Right Eye Near:   Left Eye Near:    Bilateral Near:         MDM   1. Abrasion of right cornea, sequela    Tobrex eye solution eye gel apply 0.5inch to right eye Tid x 7 days and right eye patch applied.  Patient advised to take tylenol or motrin otc as directed for eye pain.     Lysbeth Penner, FNP 05/02/16 [phone]

## 2016-05-02 NOTE — ED Triage Notes (Signed)
Patient bent over and rammed a piece of card board into right eye.  Initially vision ok, gradually getting blurry. Reports right eye is more painful if kept closed or applies pressure to eye

## 2016-08-22 ENCOUNTER — Other Ambulatory Visit (HOSPITAL_COMMUNITY): Payer: Self-pay | Admitting: Orthopedic Surgery

## 2016-08-22 DIAGNOSIS — M25561 Pain in right knee: Secondary | ICD-10-CM

## 2016-08-30 ENCOUNTER — Encounter (HOSPITAL_COMMUNITY)
Admission: RE | Admit: 2016-08-30 | Discharge: 2016-08-30 | Disposition: A | Discharge status: Home / Self Care | Payer: Medicare HMO | Source: Ambulatory Visit | Attending: Orthopedic Surgery | Admitting: Orthopedic Surgery

## 2016-08-30 DIAGNOSIS — M25561 Pain in right knee: Secondary | ICD-10-CM | POA: Insufficient documentation

## 2016-08-30 MED ORDER — TECHNETIUM TC 99M MEDRONATE IV KIT
21.4000 | PACK | Freq: Once | INTRAVENOUS | Status: AC | PRN
  Administered 2016-08-30: 21.4 via INTRAVENOUS

## 2016-09-05 ENCOUNTER — Other Ambulatory Visit: Payer: Self-pay | Admitting: Neurology

## 2016-09-05 DIAGNOSIS — G43711 Chronic migraine without aura, intractable, with status migrainosus: Secondary | ICD-10-CM

## 2016-09-07 ENCOUNTER — Other Ambulatory Visit: Payer: Self-pay | Admitting: Neurology

## 2016-09-07 DIAGNOSIS — G43711 Chronic migraine without aura, intractable, with status migrainosus: Secondary | ICD-10-CM

## 2016-10-03 DIAGNOSIS — M19172 Post-traumatic osteoarthritis, left ankle and foot: Secondary | ICD-10-CM | POA: Insufficient documentation

## 2016-10-04 ENCOUNTER — Encounter: Payer: Self-pay | Admitting: Neurology

## 2016-10-04 ENCOUNTER — Ambulatory Visit (INDEPENDENT_AMBULATORY_CARE_PROVIDER_SITE_OTHER): Payer: Medicare HMO | Admitting: Neurology

## 2016-10-04 ENCOUNTER — Encounter (INDEPENDENT_AMBULATORY_CARE_PROVIDER_SITE_OTHER): Payer: Self-pay

## 2016-10-04 VITALS — BP 144/89 | HR 60 | Ht 66.0 in | Wt 184.6 lb

## 2016-10-04 DIAGNOSIS — G4484 Primary exertional headache: Secondary | ICD-10-CM

## 2016-10-04 DIAGNOSIS — G43711 Chronic migraine without aura, intractable, with status migrainosus: Secondary | ICD-10-CM | POA: Diagnosis not present

## 2016-10-04 MED ORDER — METOCLOPRAMIDE HCL 10 MG PO TABS: 10.0000 mg | ORAL_TABLET | Freq: Three times a day (TID) | ORAL | 12 refills | Status: DC | PRN

## 2016-10-04 MED ORDER — ZONISAMIDE 100 MG PO CAPS: 100.0000 mg | ORAL_CAPSULE | Freq: Every day | ORAL | 12 refills | Status: DC

## 2016-10-04 MED ORDER — PROPRANOLOL HCL 20 MG PO TABS: 20.0000 mg | ORAL_TABLET | Freq: Two times a day (BID) | ORAL | 12 refills | Status: DC

## 2016-10-04 NOTE — Progress Notes (Signed)
[facility]    Provider:  Dr Jaynee Eagles Referring Provider: Willey Blade, MD Primary Care Physician:  Willey Blade, MD   CC:  Migraine  Interval history 10/04/2016  10/04/2016: Patient is here for chronic migraines. She is improved with regard to the migraines. She still has exertional headaches. Her blood pressure is slightly elevated. Discussed increasing propranolol for exertional headaches and may also help with hypertension she should be monitoring her blood pressure daily. Discussed exertional headaches. Discussed hypertension and following up with her primary care for management. She is doing well on Zonisamide. We'll continue 100 mg in the evenings. Discussed headache triggers, environmental triggers, stress triggers and lifestyle.  HPI:  Terry Shannon is a 61 y.o. female here as a second opinion from my colleague Dr. Brett Fairy  for migraines. She has had the migraines since the age of 53. Certain foods and drinks are triggers and she has taken those out of her diet, like wine and aspertame. The headaches start in the back of her neck, it gets tight then spreads to the left side of her head. It is poinding, throbbing, pain behind the eye temple pounding, +light sensitivity, +sound sensitivity, + nausea, no vomiting in over 10 years. Another trigger is exercise. When she exercise she will get a migraine that day. No medicaton overuse headache. Has been going on for years at this frequency. She has 16 migraines a month, they can last for 1-3 days, on average they are 8/10 in severity. They started years ago after a myelogram. No FHx of migraines. She was recently started on Zonegran by Dr. Roddie Mc which she feels may be helping. Reports that she has had normal brain imaging in the past.  Tried: Topamax, amitriptyline, propranolol and she has tried a lot that she can't remember and was on these for years, stopped working.Taking Zonegran now. She uses flexeril and an  aspirin now on onset which seems to help a little. Imitrex used to help but stopped helping. She tried zomig and mazalt and relpax.  Has contraindication to NSAIDs but still takes aspirin as this is the only thing that appears to help acutely.   Reviewed notes, labs and imaging from outside physicians, which showed: Personally reviewed images and agree with the following  Vertebral Arteries: BILATERAL patent, LEFT dominant.  Paraspinal tissues: Paravertebral anterior soft tissues are obscured by saturation bands. No visible adenopathy.  Disc levels:  The individual disc spaces were examined as follows:  C2-3: Normal disc space. Trace anterolisthesis. Severe left-sided facet arthropathy. No foraminal narrowing.  C3-4: Moderately severe disc space narrowing. Fusion across the facet joints. No impingement.  C4-5: Central disc osteophyte complex. Trace anterolisthesis. No impingement.  C5-6: Moderate disc space narrowing. Asymmetric foraminal narrowing on the RIGHT due to uncinate spurring. RIGHT C6 nerve root impingement not excluded.  C6-7: Central disc osteophyte complex. No stenosis or foraminal narrowing.  C7-T1: 3 mm anterolisthesis is facet mediated. Advanced facet arthropathy without impingement.  IMPRESSION: Multilevel spondylosis as described. No significant left-sided foraminal narrowing or leftward disc protrusion.  Asymmetric facet arthropathy on the LEFT is most notable at C2-3. Correlate clinically for facet mediated upper extremity symptoms.  Review of Systems: Patient complains of symptoms per HPI as well as the following symptoms: Hearing loss, ear pain, runny nose, environmental allergies, joint pain, joint swelling, back pain, aching muscles, walking difficulty, neck pain, neck stiffness, headache. Pertinent negatives per HPI. All others negative.    Social History   Social History  . Marital status:  Significant Other    Spouse name: N/A  . Number  of children: N/A  . Years of education: N/A   Occupational History  . Not on file.   Social History Main Topics  . Smoking status: Never Smoker  . Smokeless tobacco: Never Used  . Alcohol use 0.0 oz/week     Comment: 2-3 drinks/week  . Drug use: No  . Sexual activity: Not Currently    Birth control/ protection: None   Other Topics Concern  . Not on file   Social History Narrative   Lives at home    Family History  Problem Relation Age of Onset  . Stroke Mother   . Cancer Father 55       lung  . Diabetes Brother   . Cancer Maternal Grandmother 58       colon  . Stroke Maternal Grandmother   . Stroke Maternal Grandfather   . Diabetes Paternal Grandmother     Past Medical History:  Diagnosis Date  . Allergy    year-round, pt. states  . Chronic lower back pain   . 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 Complication of anesthesia    slow to wake up after gallbladder surgery  . Depression   . Eczema    ARMS AND HANDS  . GERD (gastroesophageal reflux disease)   . History of thyroid cancer   . Hypothyroidism   . Migraines   . Osteoarthritis   . Overactive bladder   . RA (rheumatoid arthritis) ([place])   . Sleep apnea    no CPAP use  . Ulcerative colitis [facility])     Past Surgical History:  Procedure Laterality Date  . ABDOMINAL HERNIA REPAIR  [id number]/6384   periumbilical ventral hernia/incisional hernia  . BUNIONECTOMY Right    x 2 more  . BUNIONECTOMY Left   . BUNIONECTOMY WITH CHILECTOMY Right 12/16/2005  . CARPAL TUNNEL RELEASE Right 07/13/2001  . CARPAL TUNNEL RELEASE Left 08/10/2001  . DILATION AND CURETTAGE OF UTERUS    . HAMMER TOE SURGERY Left may 2014  . HYSTEROSCOPY W/D&C  03/01/2004   with exc. of endometrial polyp  . KNEE ARTHROSCOPY Right 2013  . LAPAROSCOPIC CHOLECYSTECTOMY  11/03/2006  . LUMBAR FUSION  03/06/2014   l4  l5   . THYROIDECTOMY, PARTIAL Right prior to 2002  . THYROIDECTOMY, PARTIAL Left 11/29/2000   and isthmus  .  TONSILLECTOMY  1961  . TOTAL KNEE ARTHROPLASTY Right 04/01/2013   Procedure: RIGHT TOTAL KNEE ARTHROPLASTY;  Surgeon: Alta Corning, MD;  Location: [place];  Service: Orthopedics;  Laterality: Right;    Current Outpatient Prescriptions  Medication Sig Dispense Refill  . Cholecalciferol (VITAMIN D3) 2000 UNITS capsule Take 2,000 Units by mouth daily.    . cyclobenzaprine (FLEXERIL) 10 MG tablet Take 10 mg by mouth 3 (three) times daily as needed for muscle spasms.    Marland Kitchen desvenlafaxine (PRISTIQ) 100 MG 24 hr tablet Take 100 mg by mouth every morning.    Marland Kitchen esomeprazole (NEXIUM) 40 MG packet Take 40 mg by mouth daily before breakfast.    . levothyroxine (SYNTHROID, LEVOTHROID) 25 MCG tablet levothyroxine 25 mcg tablet  TAKE 1 TABLET BY MOUTH EVERY DAY    . magnesium oxide (MAG-OX) 400 MG tablet Take 400 mg by mouth daily.    . mesalamine (LIALDA) 1.2 G EC tablet Take 2.4 g by mouth 2 (two) times daily.     . metoCLOPramide (REGLAN) 10 MG tablet Take 1 tablet (  10 mg total) by mouth every 8 (eight) hours as needed for nausea. 12 tablet 12  . Omega-3 Fatty Acids (FISH OIL) 1000 MG CAPS Take 3,000 mg by mouth daily.    Marland Kitchen oxybutynin (DITROPAN-XL) 10 MG 24 hr tablet Take 10 mg by mouth daily.    . Probiotic Product (PROBIOTIC DAILY PO) Take 1 tablet by mouth daily.     . propranolol (INDERAL) 20 MG tablet Take 1 tablet (20 mg total) by mouth 2 (two) times daily. 60 tablet 12  . thyroid (ARMOUR THYROID) 120 MG tablet Armour Thyroid 120 mg tablet    . Zolpidem Tartrate (AMBIEN PO) Take by mouth.    . zonisamide (ZONEGRAN) 100 MG capsule Take 1 capsule (100 mg total) by mouth daily. 30 capsule 12   No current facility-administered medications for this visit.     Allergies as of 10/04/2016 - Review Complete 10/04/2016  Allergen Reaction Noted  . Aleve  [naproxen sodium]    . Codeine Nausea And Vomiting 09/09/2010  . Hydrocodone Nausea And Vomiting 08/08/2012  . Ibuprofen Other (See Comments)  08/08/2012  . Iodinated diagnostic agents Hives and Itching 09/16/2010  . Methotrexate derivatives Other (See Comments) 08/08/2012  . Oxycodone Nausea And Vomiting 04/01/2013  . Tylenol [acetaminophen] Other (See Comments) 08/08/2012  . Neosporin [neomycin-bacitracin zn-polymyx] Swelling 07/18/2013  . Sulfa antibiotics Other (See Comments) 11/01/2011    Vitals: BP (!) 144/89   Pulse 60   Ht 5\' 6"  (1.676 m)   Wt 184 lb 9.6 oz (83.7 kg)   BMI 29.80 kg/m  Last Weight:  Wt Readings from Last 1 Encounters:  10/04/16 184 lb 9.6 oz (83.7 kg)   Last Height:   Ht Readings from Last 1 Encounters:  10/04/16 5\' 6"  (1.676 m)    Physical exam: Exam: Gen: NAD, conversant, well nourised, obese, well groomed                     Eyes: Conjunctivae clear without exudates or hemorrhage  Neuro: Detailed Neurologic Exam  Speech:    Speech is normal; fluent and spontaneous with normal comprehension.  Cognition:    The patient is oriented to person, place, and time;     Cranial Nerves: Extraocular movements are intact. Trigeminal sensation is intact and the muscles of mastication are normal. The face is symmetric. The palate elevates in the midline. Hearing intact. Voice is normal. Shoulder shrug is normal. The tongue has normal motion without fasciculations.   Gait:     antalgic with a cane  Motor Observation:    No asymmetry, no atrophy, and no involuntary movements noted. Tone:    Normal muscle tone.    Posture:    Posture is normal. normal erect    Strength:    Strength is V/V in the upper and lower limbs.        Assessment/Plan:  61 year old female with chronic migraines, exertional headaches and hypertension.  - For her exercise-induced headaches as well as migraine prevention: Increase propranolol 20mg  twice a day.  Discussion about side effects as per patient instructions especially monitoring heart rate, blood pressure and stopping for dizziness, lightheadedness,  chest pain or any significant side effects. - Continue zonisamide to 100 mg each evening, change to 100 mg pill advised patient to be careful and not take 4 pills at night to remember that it's now one pill at night daily. - On onset of headaache, flexeril and reglan. I discussed Reglan side effects including tardive dyskinesias  and other movement disorders. - For her hypertension, propranolol will help I advised her to follow up with her primary care take her blood pressure daily.  Discussed Today: To prevent or relieve headaches, try the following: Cool Compress. Lie down and place a cool compress on your head.  Avoid headache triggers. If certain foods or odors seem to have triggered your migraines in the past, avoid them. A headache diary might help you identify triggers.  Include physical activity in your daily routine. Try a daily walk or other moderate aerobic exercise.  Manage stress. Find healthy ways to cope with the stressors, such as delegating tasks on your to-do list.  Practice relaxation techniques. Try deep breathing, yoga, massage and visualization.  Eat regularly. Eating regularly scheduled meals and maintaining a healthy diet might help prevent headaches. Also, drink plenty of fluids.  Follow a regular sleep schedule. Sleep deprivation might contribute to headaches Consider biofeedback. With this mind-body technique, you learn to control certain bodily functions - such as muscle tension, heart rate and blood pressure - to prevent headaches or reduce headache pain.    Proceed to emergency room if you experience new or worsening symptoms or symptoms do not resolve, if you have new neurologic symptoms or if headache is severe, or for any concerning symptom.     Sarina Ill, MD  [facility] [address] [place], [place] [address]  Phone [phone] Fax [phone]  A total of 25 minutes was spent face-to-face with this patient. Over  half this time was spent on counseling patient on the exertional headache diagnosis and different diagnostic and therapeutic options available.

## 2016-10-04 NOTE — Patient Instructions (Signed)
Remember to drink plenty of fluid, eat healthy meals and do not skip any meals. Try to eat protein with a every meal and eat a healthy snack such as fruit or nuts in between meals. Try to keep a regular sleep-wake schedule and try to exercise daily, particularly in the form of walking, 20-30 minutes a day, if you can.   As far as your medications are concerned, I would like to suggest: Continue Zonisamide. Increase propranolol to 20mg  twice daily.  I would like to see you back in 1 year, sooner if we need to. Please call us with any interim questions, concerns, problems, updates or refill requests.   Our phone number is [phone]. We also have an after hours call service for urgent matters and there is a physician on-call for urgent questions. For any emergencies you know to call 911 or go to the nearest emergency room  Propranolol tablets What should I tell my health care provider before I take this medicine? They need to know if you have any of these conditions: -circulation problems or blood vessel disease -diabetes -history of heart attack or heart disease, vasospastic angina -kidney disease -liver disease -lung or breathing disease, like asthma or emphysema -pheochromocytoma -slow heart rate -thyroid disease -an unusual or allergic reaction to propranolol, other beta-blockers, medicines, foods, dyes, or preservatives -pregnant or trying to get pregnant -breast-feeding How should I use this medicine? Take this medicine by mouth with a glass of water. Follow the directions on the prescription label. Take your doses at regular intervals. Do not take your medicine more often than directed. Do not stop taking except on your the advice of your doctor or health care professional. Talk to your pediatrician regarding the use of this medicine in children. Special care may be needed. Overdosage: If you think you have taken too much of this medicine contact a poison control center or emergency  room at once. NOTE: This medicine is only for you. Do not share this medicine with others. What if I miss a dose? If you miss a dose, take it as soon as you can. If it is almost time for your next dose, take only that dose. Do not take double or extra doses. What may interact with this medicine? Do not take this medicine with any of the following medications: -feverfew -phenothiazines like chlorpromazine, mesoridazine, prochlorperazine, thioridazine This medicine may also interact with the following medications: -aluminum hydroxide gel -antipyrine -antiviral medicines for HIV or AIDS -barbiturates like phenobarbital -certain medicines for blood pressure, heart disease, irregular heart beat -cimetidine -ciprofloxacin -diazepam -fluconazole -haloperidol -isoniazid -medicines for cholesterol like cholestyramine or colestipol -medicines for mental depression -medicines for migraine headache like almotriptan, eletriptan, frovatriptan, naratriptan, rizatriptan, sumatriptan, zolmitriptan -NSAIDs, medicines for pain and inflammation, like ibuprofen or naproxen -phenytoin -rifampin -teniposide -theophylline -thyroid medicines -tolbutamide -warfarin -zileuton This list may not describe all possible interactions. Give your health care provider a list of all the medicines, herbs, non-prescription drugs, or dietary supplements you use. Also tell them if you smoke, drink alcohol, or use illegal drugs. Some items may interact with your medicine. What should I watch for while using this medicine? Visit your doctor or health care professional for regular check ups. Check your blood pressure and pulse rate regularly. Ask your health care professional what your blood pressure and pulse rate should be, and when you should contact them. You may get drowsy or dizzy. Do not drive, use machinery, or do anything that needs mental alertness until you  know how this drug affects you. Do not stand or sit up  quickly, especially if you are an older patient. This reduces the risk of dizzy or fainting spells. Alcohol can make you more drowsy and dizzy. Avoid alcoholic drinks. This medicine can affect blood sugar levels. If you have diabetes, check with your doctor or health care professional before you change your diet or the dose of your diabetic medicine. Do not treat yourself for coughs, colds, or pain while you are taking this medicine without asking your doctor or health care professional for advice. Some ingredients may increase your blood pressure. What side effects may I notice from receiving this medicine? Side effects that you should report to your doctor or health care professional as soon as possible: -allergic reactions like skin rash, itching or hives, swelling of the face, lips, or tongue -breathing problems -changes in blood sugar -cold hands or feet -difficulty sleeping, nightmares -dry peeling skin -hallucinations -muscle cramps or weakness -slow heart rate -swelling of the legs and ankles -vomiting Side effects that usually do not require medical attention (report to your doctor or health care professional if they continue or are bothersome): -change in sex drive or performance -diarrhea -dry sore eyes -hair loss -nausea -weak or tired This list may not describe all possible side effects. Call your doctor for medical advice about side effects. You may report side effects to FDA at 1-800-FDA-1088. Where should I keep my medicine? Keep out of the reach of children. Store at room temperature between 15 and 30 degrees C (59 and 86 degrees F). Protect from light. Throw away any unused medicine after the expiration date. NOTE: This sheet is a summary. It may not cover all possible information. If you have questions about this medicine, talk to your doctor, pharmacist, or health care provider.  2018 [employer] Standard (2013-01-11 14:51:53)

## 2016-11-20 HISTORY — PX: TOTAL ANKLE REPLACEMENT: SUR1218

## 2016-12-12 DIAGNOSIS — K219 Gastro-esophageal reflux disease without esophagitis: Secondary | ICD-10-CM | POA: Insufficient documentation

## 2016-12-12 DIAGNOSIS — Z85850 Personal history of malignant neoplasm of thyroid: Secondary | ICD-10-CM | POA: Insufficient documentation

## 2016-12-12 DIAGNOSIS — R011 Cardiac murmur, unspecified: Secondary | ICD-10-CM | POA: Insufficient documentation

## 2016-12-12 DIAGNOSIS — F419 Anxiety disorder, unspecified: Secondary | ICD-10-CM | POA: Insufficient documentation

## 2016-12-12 DIAGNOSIS — R0789 Other chest pain: Secondary | ICD-10-CM | POA: Insufficient documentation

## 2017-01-10 DIAGNOSIS — Z96669 Presence of unspecified artificial ankle joint: Secondary | ICD-10-CM | POA: Insufficient documentation

## 2017-01-10 DIAGNOSIS — Z471 Aftercare following joint replacement surgery: Secondary | ICD-10-CM | POA: Insufficient documentation

## 2017-10-04 ENCOUNTER — Encounter: Payer: Self-pay | Admitting: Neurology

## 2017-10-04 ENCOUNTER — Ambulatory Visit: Payer: Medicare HMO | Admitting: Neurology

## 2017-10-04 VITALS — BP 155/78 | HR 64 | Ht 66.0 in | Wt 182.0 lb

## 2017-10-04 DIAGNOSIS — G43009 Migraine without aura, not intractable, without status migrainosus: Secondary | ICD-10-CM | POA: Diagnosis not present

## 2017-10-04 DIAGNOSIS — G43711 Chronic migraine without aura, intractable, with status migrainosus: Secondary | ICD-10-CM

## 2017-10-04 MED ORDER — ALPRAZOLAM 0.5 MG PO TABS: 0.5000 mg | ORAL_TABLET | Freq: Three times a day (TID) | ORAL | 1 refills | Status: DC | PRN

## 2017-10-04 MED ORDER — SUMATRIPTAN SUCCINATE 100 MG PO TABS: 100.0000 mg | ORAL_TABLET | Freq: Once | ORAL | 12 refills | Status: DC | PRN

## 2017-10-04 NOTE — Progress Notes (Signed)
[facility]    Provider:  Dr Jaynee Eagles Referring Provider: Willey Blade, MD Primary Care Physician:  Willey Blade, MD   CC:  Migraine  Interval history 10/04/2016  10/04/2017: She is doing exceptionally well. She tolerates the medication. She is very thankful. Discussed her current regimen, the new medications anti-CGRP meds should she every need them, also botox for migraine and other preventative and acute management but she very happy as is.  Discussed that she now gets headaches and moigraines after her ESI or LBP, unclear if it is the anxiety of the shot, discussed options will give xanax prn   10/04/2016: Patient is here for chronic migraines. She is improved with regard to the migraines. She still has exertional headaches. Her blood pressure is slightly elevated. Discussed increasing propranolol for exertional headaches and may also help with hypertension she should be monitoring her blood pressure daily. Discussed exertional headaches. Discussed hypertension and following up with her primary care for management. She is doing well on Zonisamide. We'll continue 100 mg in the evenings. Discussed headache triggers, environmental triggers, stress triggers and lifestyle.  HPI:  Terry Shannon is a 62 y.o. female here as a second opinion from my colleague Dr. Brett Fairy  for migraines. She has had the migraines since the age of 62. Certain foods and drinks are triggers and she has taken those out of her diet, like wine and aspertame. The headaches start in the back of her neck, it gets tight then spreads to the left side of her head. It is poinding, throbbing, pain behind the eye temple pounding, +light sensitivity, +sound sensitivity, + nausea, no vomiting in over 10 years. Another trigger is exercise. When she exercise she will get a migraine that day. No medicaton overuse headache. Has been going on for years at this frequency. She has 16 migraines a month, they can last  for 1-3 days, on average they are 8/10 in severity. They started years ago after a myelogram. No FHx of migraines. She was recently started on Zonegran by Dr. Roddie Mc which she feels may be helping. Reports that she has had normal brain imaging in the past.  Tried: Topamax, amitriptyline, propranolol and she has tried a lot that she can't remember and was on these for years, stopped working.Taking Zonegran now. She uses flexeril and an aspirin now on onset which seems to help a little. Imitrex used to help but stopped helping. She tried zomig and mazalt and relpax.  Has contraindication to NSAIDs but still takes aspirin as this is the only thing that appears to help acutely.   Reviewed notes, labs and imaging from outside physicians, which showed: Personally reviewed images and agree with the following  Vertebral Arteries: BILATERAL patent, LEFT dominant.  Paraspinal tissues: Paravertebral anterior soft tissues are obscured by saturation bands. No visible adenopathy.  Disc levels:  The individual disc spaces were examined as follows:  C2-3: Normal disc space. Trace anterolisthesis. Severe left-sided facet arthropathy. No foraminal narrowing.  C3-4: Moderately severe disc space narrowing. Fusion across the facet joints. No impingement.  C4-5: Central disc osteophyte complex. Trace anterolisthesis. No impingement.  C5-6: Moderate disc space narrowing. Asymmetric foraminal narrowing on the RIGHT due to uncinate spurring. RIGHT C6 nerve root impingement not excluded.  C6-7: Central disc osteophyte complex. No stenosis or foraminal narrowing.  C7-T1: 3 mm anterolisthesis is facet mediated. Advanced facet arthropathy without impingement.  IMPRESSION: Multilevel spondylosis as described. No significant left-sided foraminal narrowing or leftward disc protrusion.  Asymmetric facet arthropathy  on the LEFT is most notable at C2-3. Correlate clinically for facet mediated upper  extremity symptoms.  Review of Systems: Patient complains of symptoms per HPI as well as the following symptoms: Hearing loss, ear pain, runny nose, environmental allergies, joint pain, joint swelling, back pain, aching muscles, walking difficulty, neck pain, neck stiffness, headache. Pertinent negatives per HPI. All others negative.    Social History   Socioeconomic History  . Marital status: Significant Other    Spouse name: Not on file  . Number of children: Not on file  . Years of education: Not on file  . Highest education level: Bachelor's degree (e.g., BA, AB, BS)  Occupational History  . Not on file  Social Needs  . Financial resource strain: Not on file  . Food insecurity:    Worry: Not on file    Inability: Not on file  . Transportation needs:    Medical: Not on file    Non-medical: Not on file  Tobacco Use  . Smoking status: Never Smoker  . Smokeless tobacco: Never Used  Substance and Sexual Activity  . Alcohol use: Yes    Alcohol/week: 0.0 oz    Comment: 2-3 drinks/week  . Drug use: No  . Sexual activity: Not Currently    Birth control/protection: None  Lifestyle  . Physical activity:    Days per week: Not on file    Minutes per session: Not on file  . Stress: Not on file  Relationships  . Social connections:    Talks on phone: Not on file    Gets together: Not on file    Attends religious service: Not on file    Active member of club or organization: Not on file    Attends meetings of clubs or organizations: Not on file    Relationship status: Not on file  . Intimate partner violence:    Fear of current or ex partner: Not on file    Emotionally abused: Not on file    Physically abused: Not on file    Forced sexual activity: Not on file  Other Topics Concern  . Not on file  Social History Narrative   Lives at home with her partner and one daughter   Right handed   Caffeine: 2 cups in the morning    Family History  Problem Relation Age of  Onset  . Stroke Mother   . Cancer Father 44       lung  . Diabetes Brother   . Barrett's esophagus Brother   . Cancer Maternal Grandmother 83       colon  . Stroke Maternal Grandmother   . Stroke Maternal Grandfather   . Diabetes Paternal Grandmother     Past Medical History:  Diagnosis Date  . Allergy    year-round, pt. states  . Chronic lower back pain   . 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 Complication of anesthesia    slow to wake up after gallbladder surgery  . Depression   . Eczema    ARMS AND HANDS  . GERD (gastroesophageal reflux disease)   . History of thyroid cancer   . Hypothyroidism   . Migraines   . Osteoarthritis   . Overactive bladder   . RA (rheumatoid arthritis) ([place])   . Sleep apnea    no CPAP use  . Ulcerative colitis [facility])     Past Surgical History:  Procedure Laterality Date  . ABDOMINAL HERNIA REPAIR  03/13/2008  periumbilical ventral hernia/incisional hernia  . BUNIONECTOMY Right    x 2 more  . BUNIONECTOMY Left   . BUNIONECTOMY WITH CHILECTOMY Right 12/16/2005  . CARPAL TUNNEL RELEASE Right 07/13/2001  . CARPAL TUNNEL RELEASE Left 08/10/2001  . DILATION AND CURETTAGE OF UTERUS    . HAMMER TOE SURGERY Left may 2014  . HYSTEROSCOPY W/D&C  03/01/2004   with exc. of endometrial polyp  . KNEE ARTHROSCOPY Right 2013  . LAPAROSCOPIC CHOLECYSTECTOMY  11/03/2006  . LUMBAR FUSION  03/06/2014   l4  l5   . THYROIDECTOMY, PARTIAL Right prior to 2002  . THYROIDECTOMY, PARTIAL Left 11/29/2000   and isthmus  . TONSILLECTOMY  1961  . TOTAL ANKLE REPLACEMENT Left 11/2016   with tendon repair  . TOTAL KNEE ARTHROPLASTY Right 04/01/2013   Procedure: RIGHT TOTAL KNEE ARTHROPLASTY;  Surgeon: Alta Corning, MD;  Location: [facility];  Service: Orthopedics;  Laterality: Right;    Current Outpatient Medications  Medication Sig Dispense Refill  . Cholecalciferol (VITAMIN D3 PO) Take by mouth.    . cyclobenzaprine (FLEXERIL) 10 MG tablet Take  10 mg by mouth 3 (three) times daily as needed for muscle spasms.    Marland Kitchen desvenlafaxine (PRISTIQ) 100 MG 24 hr tablet Take 100 mg by mouth every morning.    Marland Kitchen esomeprazole (NEXIUM) 40 MG packet Take 40 mg by mouth daily before breakfast.    . etanercept (ENBREL) 50 MG/ML injection Inject 50 mg into the skin once a week.    . levothyroxine (SYNTHROID, LEVOTHROID) 25 MCG tablet levothyroxine 25 mcg tablet  TAKE 1 TABLET BY MOUTH EVERY DAY    . magnesium oxide (MAG-OX) 400 MG tablet Take 400 mg by mouth daily.    . mesalamine (LIALDA) 1.2 G EC tablet Take 2.4 g by mouth 2 (two) times daily.     . metoCLOPramide (REGLAN) 10 MG tablet Take 1 tablet (10 mg total) by mouth every 8 (eight) hours as needed for nausea. 12 tablet 12  . Omega-3 Fatty Acids (OMEGA 3 PO) Take 1,500 mg by mouth daily.    Marland Kitchen oxybutynin (DITROPAN-XL) 10 MG 24 hr tablet Take 10 mg by mouth daily.    . Probiotic Product (PROBIOTIC DAILY PO) Take 1 tablet by mouth daily.     . propranolol (INDERAL) 20 MG tablet Take 1 tablet (20 mg total) by mouth 2 (two) times daily. 60 tablet 12  . thyroid (ARMOUR) 90 MG tablet Take 90 mg by mouth.    . TURMERIC PO Take 1 capsule by mouth 2 (two) times daily.    . Zolpidem Tartrate (AMBIEN PO) Take by mouth.    . zonisamide (ZONEGRAN) 100 MG capsule Take 1 capsule (100 mg total) by mouth daily. 30 capsule 12  . ALPRAZolam (XANAX) 0.5 MG tablet Take 1 tablet (0.5 mg total) by mouth 3 (three) times daily as needed for anxiety. 30 tablet 1  . SUMAtriptan (IMITREX) 100 MG tablet Take 1 tablet (100 mg total) by mouth once as needed for up to 1 dose. May repeat in 2 hours if headache persists or recurs. 10 tablet 12   No current facility-administered medications for this visit.     Allergies as of 10/04/2017 - Review Complete 10/04/2017  Allergen Reaction Noted  . Aleve  [naproxen sodium]    . Codeine Nausea And Vomiting 09/09/2010  . Hydrocodone Nausea And Vomiting 08/08/2012  . Ibuprofen Other  (See Comments) 08/08/2012  . Iodinated diagnostic agents Hives and Itching 09/16/2010  .  Methotrexate derivatives Other (See Comments) 08/08/2012  . Oxycodone Nausea And Vomiting 04/01/2013  . Tylenol [acetaminophen] Other (See Comments) 08/08/2012  . Neosporin [neomycin-bacitracin zn-polymyx] Swelling 07/18/2013  . Sulfa antibiotics Other (See Comments) 11/01/2011    Vitals: BP (!) 155/78 (BP Location: Right Arm, Patient Position: Sitting)   Pulse 64   Ht 5\' 6"  (1.676 m)   Wt 182 lb (82.6 kg)   BMI 29.38 kg/m  Last Weight:  Wt Readings from Last 1 Encounters:  10/04/17 182 lb (82.6 kg)   Last Height:   Ht Readings from Last 1 Encounters:  10/04/17 5\' 6"  (1.676 m)    Physical exam: Exam: Gen: NAD, conversant, well nourised, obese, well groomed                     Eyes: Conjunctivae clear without exudates or hemorrhage  Neuro: Detailed Neurologic Exam  Speech:    Speech is normal; fluent and spontaneous with normal comprehension.  Cognition:    The patient is oriented to person, place, and time;     Cranial Nerves: Extraocular movements are intact. Trigeminal sensation is intact and the muscles of mastication are normal. The face is symmetric. The palate elevates in the midline. Hearing intact. Voice is normal. Shoulder shrug is normal. The tongue has normal motion without fasciculations.   Gait:     antalgic with a cane  Motor Observation:    No asymmetry, no atrophy, and no involuntary movements noted. Tone:    Normal muscle tone.    Posture:    Posture is normal. normal erect    Strength:    Strength is V/V in the upper and lower limbs.        Assessment/Plan:  62 year old female with chronic migraines, exertional headaches and hypertension.  - For her exercise-induced headaches as well as migraine prevention: continue propranolol 20mg  twice a day. No side effects. - Continue zonisamide to 100 mg each evening,  - On onset of headaache, flexeril  and reglan. I discussed Reglan side effects including tardive dyskinesias and other movement disorders. - For her hypertension, propranolol will help I advised her to follow up with her primary care take her bloo pressure daily. - Chronic low back pain: She will get a migraine after her ESI, unclear if this is due to anxiety, will give her as needed xanax before her ESI, do not drive    Discussed: To prevent or relieve headaches, try the following: Cool Compress. Lie down and place a cool compress on your head.  Avoid headache triggers. If certain foods or odors seem to have triggered your migraines in the past, avoid them. A headache diary might help you identify triggers.  Include physical activity in your daily routine. Try a daily walk or other moderate aerobic exercise.  Manage stress. Find healthy ways to cope with the stressors, such as delegating tasks on your to-do list.  Practice relaxation techniques. Try deep breathing, yoga, massage and visualization.  Eat regularly. Eating regularly scheduled meals and maintaining a healthy diet might help prevent headaches. Also, drink plenty of fluids.  Follow a regular sleep schedule. Sleep deprivation might contribute to headaches Consider biofeedback. With this mind-body technique, you learn to control certain bodily functions - such as muscle tension, heart rate and blood pressure - to prevent headaches or reduce headache pain.    Proceed to emergency room if you experience new or worsening symptoms or symptoms do not resolve, if you have new neurologic symptoms or  if headache is severe, or for any concerning symptom.   Provided education and documentation from American headache Society toolbox including articles on: chronic migraine medication overuse headache, chronic migraines, prevention of migraines, behavioral and other nonpharmacologic treatments for headache.    Sarina Ill, MD  [facility] [address]  [place], [place] [address]  Phone [phone] Fax [phone]  A total of 25 minutes was spent face-to-face with this patient. Over half this time was spent on counseling patient on the migraine headache diagnosis and different diagnostic and therapeutic options available.

## 2017-10-04 NOTE — Patient Instructions (Addendum)
Alprazolam tablets What is this medicine? ALPRAZOLAM (al PRAY zoe lam) is a benzodiazepine. It is used to treat anxiety and panic attacks. This medicine may be used for other purposes; ask your health care provider or pharmacist if you have questions. COMMON BRAND NAME(S): Xanax What should I tell my health care provider before I take this medicine? They need to know if you have any of these conditions: -an alcohol or drug abuse problem -bipolar disorder, depression, psychosis or other mental health conditions -glaucoma -kidney or liver disease -lung or breathing disease -myasthenia gravis -Parkinson's disease -porphyria -seizures or a history of seizures -suicidal thoughts -an unusual or allergic reaction to alprazolam, other benzodiazepines, foods, dyes, or preservatives -pregnant or trying to get pregnant -breast-feeding How should I use this medicine? Take this medicine by mouth with a glass of water. Follow the directions on the prescription label. Take your medicine at regular intervals. Do not take it more often than directed. Do not stop taking except on your doctor's advice. A special MedGuide will be given to you by the pharmacist with each prescription and refill. Be sure to read this information carefully each time. Talk to your pediatrician regarding the use of this medicine in children. Special care may be needed. Overdosage: If you think you have taken too much of this medicine contact a poison control center or emergency room at once. NOTE: This medicine is only for you. Do not share this medicine with others. What if I miss a dose? If you miss a dose, take it as soon as you can. If it is almost time for your next dose, take only that dose. Do not take double or extra doses. What may interact with this medicine? Do not take this medicine with any of the following medications: -certain antiviral medicines for HIV or AIDS like delavirdine, indinavir -certain medicines  for fungal infections like ketoconazole and itraconazole -narcotic medicines for cough -sodium oxybate This medicine may also interact with the following medications: -alcohol -antihistamines for allergy, cough and cold -certain antibiotics like clarithromycin, erythromycin, isoniazid, rifampin, rifapentine, rifabutin, and troleandomycin -certain medicines for blood pressure, heart disease, irregular heart beat -certain medicines for depression, like amitriptyline, fluoxetine, sertraline -certain medicines for seizures like carbamazepine, oxcarbazepine, phenobarbital, phenytoin, primidone -cimetidine -cyclosporine -female hormones, like estrogens or progestins and birth control pills, patches, rings, or injections -general anesthetics like halothane, isoflurane, methoxyflurane, propofol -grapefruit juice -local anesthetics like lidocaine, pramoxine, tetracaine -medicines that relax muscles for surgery -narcotic medicines for pain -other antiviral medicines for HIV or AIDS -phenothiazines like chlorpromazine, mesoridazine, prochlorperazine, thioridazine This list may not describe all possible interactions. Give your health care provider a list of all the medicines, herbs, non-prescription drugs, or dietary supplements you use. Also tell them if you smoke, drink alcohol, or use illegal drugs. Some items may interact with your medicine. What should I watch for while using this medicine? Tell your doctor or health care professional if your symptoms do not start to get better or if they get worse. Do not stop taking except on your doctor's advice. You may develop a severe reaction. Your doctor will tell you how much medicine to take. You may get drowsy or dizzy. Do not drive, use machinery, or do anything that needs mental alertness until you know how this medicine affects you. To reduce the risk of dizzy and fainting spells, do not stand or sit up quickly, especially if you are an older patient.  Alcohol may increase dizziness and drowsiness. Avoid alcoholic  drinks. If you are taking another medicine that also causes drowsiness, you may have more side effects. Give your health care provider a list of all medicines you use. Your doctor will tell you how much medicine to take. Do not take more medicine than directed. Call emergency for help if you have problems breathing or unusual sleepiness. What side effects may I notice from receiving this medicine? Side effects that you should report to your doctor or health care professional as soon as possible: -allergic reactions like skin rash, itching or hives, swelling of the face, lips, or tongue -breathing problems -confusion -loss of balance or coordination -signs and symptoms of low blood pressure like dizziness; feeling faint or lightheaded, falls; unusually weak or tired -suicidal thoughts or other mood changes Side effects that usually do not require medical attention (report to your doctor or health care professional if they continue or are bothersome): -dizziness -dry mouth -nausea, vomiting -tiredness This list may not describe all possible side effects. Call your doctor for medical advice about side effects. You may report side effects to FDA at 1-800-FDA-1088. Where should I keep my medicine? Keep out of the reach of children. This medicine can be abused. Keep your medicine in a safe place to protect it from theft. Do not share this medicine with anyone. Selling or giving away this medicine is dangerous and against the law. Store at room temperature between 20 and 25 degrees C (68 and 77 degrees F). This medicine may cause accidental overdose and death if taken by other adults, children, or pets. Mix any unused medicine with a substance like cat litter or coffee grounds. Then throw the medicine away in a sealed container like a sealed bag or a coffee can with a lid. Do not use the medicine after the expiration date. NOTE: This sheet is  a summary. It may not cover all possible information. If you have questions about this medicine, talk to your doctor, pharmacist, or health care provider.  2018 Elsevier/Gold Standard (2015-02-05 13:47:25)      Sumatriptan tablets What is this medicine? SUMATRIPTAN (soo ma TRIP tan) is used to treat migraines with or without aura. An aura is a strange feeling or visual disturbance that warns you of an attack. It is not used to prevent migraines. This medicine may be used for other purposes; ask your health care provider or pharmacist if you have questions. COMMON BRAND NAME(S): Imitrex, Migraine Pack What should I tell my health care provider before I take this medicine? They need to know if you have any of these conditions: -circulation problems in fingers and toes -diabetes -heart disease -high blood pressure -high cholesterol -history of irregular heartbeat -history of stroke -kidney disease -liver disease -postmenopausal or surgical removal of uterus and ovaries -seizures -smoke tobacco -stomach or intestine problems -an unusual or allergic reaction to sumatriptan, other medicines, foods, dyes, or preservatives -pregnant or trying to get pregnant -breast-feeding How should I use this medicine? Take this medicine by mouth with a glass of water. Follow the directions on the prescription label. This medicine is taken at the first symptoms of a migraine. It is not for everyday use. If your migraine headache returns after one dose, you can take another dose as directed. You must leave at least 2 hours between doses, and do not take more than 100 mg as a single dose. Do not take more than 200 mg total in any 24 hour period. If there is no improvement at all after the  first dose, do not take a second dose without talking to your doctor or health care professional. Do not take your medicine more often than directed. Talk to your pediatrician regarding the use of this medicine in  children. Special care may be needed. Overdosage: If you think you have taken too much of this medicine contact a poison control center or emergency room at once. NOTE: This medicine is only for you. Do not share this medicine with others. What if I miss a dose? This does not apply; this medicine is not for regular use. What may interact with this medicine? Do not take this medicine with any of the following medicines: -cocaine -ergot alkaloids like dihydroergotamine, ergonovine, ergotamine, methylergonovine -feverfew -MAOIs like Carbex, Eldepryl, Marplan, Nardil, and Parnate -other medicines for migraine headache like almotriptan, eletriptan, frovatriptan, naratriptan, rizatriptan, zolmitriptan -tryptophan This medicine may also interact with the following medications: -certain medicines for depression, anxiety, or psychotic disturbances This list may not describe all possible interactions. Give your health care provider a list of all the medicines, herbs, non-prescription drugs, or dietary supplements you use. Also tell them if you smoke, drink alcohol, or use illegal drugs. Some items may interact with your medicine. What should I watch for while using this medicine? Only take this medicine for a migraine headache. Take it if you get warning symptoms or at the start of a migraine attack. It is not for regular use to prevent migraine attacks. You may get drowsy or dizzy. Do not drive, use machinery, or do anything that needs mental alertness until you know how this medicine affects you. To reduce dizzy or fainting spells, do not sit or stand up quickly, especially if you are an older patient. Alcohol can increase drowsiness, dizziness and flushing. Avoid alcoholic drinks. Smoking cigarettes may increase the risk of heart-related side effects from using this medicine. If you take migraine medicines for 10 or more days a month, your migraines may get worse. Keep a diary of headache days and  medicine use. Contact your healthcare professional if your migraine attacks occur more frequently. What side effects may I notice from receiving this medicine? Side effects that you should report to your doctor or health care professional as soon as possible: -allergic reactions like skin rash, itching or hives, swelling of the face, lips, or tongue -bloody or watery diarrhea -hallucination, loss of contact with reality -pain, tingling, numbness in the face, hands, or feet -seizures -signs and symptoms of a blood clot such as breathing problems; changes in vision; chest pain; severe, sudden headache; pain, swelling, warmth in the leg; trouble speaking; sudden numbness or weakness of the face, arm, or leg -signs and symptoms of a dangerous change in heartbeat or heart rhythm like chest pain; dizziness; fast or irregular heartbeat; palpitations, feeling faint or lightheaded; falls; breathing problems -signs and symptoms of a stroke like changes in vision; confusion; trouble speaking or understanding; severe headaches; sudden numbness or weakness of the face, arm, or leg; trouble walking; dizziness; loss of balance or coordination -stomach pain Side effects that usually do not require medical attention (report to your doctor or health care professional if they continue or are bothersome): -changes in taste -facial flushing -headache -muscle cramps -muscle pain -nausea, vomiting -weak or tired This list may not describe all possible side effects. Call your doctor for medical advice about side effects. You may report side effects to FDA at 1-800-FDA-1088. Where should I keep my medicine? Keep out of the reach of children. Store  at room temperature between 2 and 30 degrees C (36 and 86 degrees F). Throw away any unused medicine after the expiration date. NOTE: This sheet is a summary. It may not cover all possible information. If you have questions about this medicine, talk to your doctor,  pharmacist, or health care provider.  2018 Elsevier/Gold Standard (2015-06-11 12:38:23)

## 2017-10-05 NOTE — Progress Notes (Signed)
Faxed Xanax prescription to pharmacy.

## 2017-10-06 MED ORDER — METOCLOPRAMIDE HCL 10 MG PO TABS: 10.0000 mg | ORAL_TABLET | Freq: Three times a day (TID) | ORAL | 12 refills | Status: DC | PRN

## 2017-10-06 MED ORDER — ZONISAMIDE 100 MG PO CAPS: 100.0000 mg | ORAL_CAPSULE | Freq: Every day | ORAL | 12 refills | Status: DC

## 2017-10-06 MED ORDER — PROPRANOLOL HCL 20 MG PO TABS: 20.0000 mg | ORAL_TABLET | Freq: Two times a day (BID) | ORAL | 12 refills | Status: DC

## 2017-10-06 MED ORDER — SUMATRIPTAN SUCCINATE 100 MG PO TABS: 100.0000 mg | ORAL_TABLET | Freq: Once | ORAL | 12 refills | Status: DC | PRN

## 2017-12-21 ENCOUNTER — Other Ambulatory Visit: Payer: Self-pay

## 2017-12-21 ENCOUNTER — Other Ambulatory Visit: Payer: Self-pay | Admitting: Gastroenterology

## 2017-12-21 DIAGNOSIS — R1032 Left lower quadrant pain: Secondary | ICD-10-CM

## 2017-12-21 DIAGNOSIS — C4492 Squamous cell carcinoma of skin, unspecified: Secondary | ICD-10-CM

## 2017-12-21 HISTORY — DX: Squamous cell carcinoma of skin, unspecified: C44.92

## 2018-01-02 ENCOUNTER — Ambulatory Visit
Admission: RE | Admit: 2018-01-02 | Discharge: 2018-01-02 | Disposition: A | Discharge status: Home / Self Care | Payer: Medicare HMO | Source: Ambulatory Visit | Attending: Gastroenterology | Admitting: Gastroenterology

## 2018-01-02 ENCOUNTER — Other Ambulatory Visit: Payer: Self-pay | Admitting: Gastroenterology

## 2018-01-02 DIAGNOSIS — R1032 Left lower quadrant pain: Secondary | ICD-10-CM

## 2018-01-03 ENCOUNTER — Inpatient Hospital Stay: Admission: RE | Admit: 2018-01-03 | Payer: Medicare HMO | Source: Ambulatory Visit

## 2018-01-10 ENCOUNTER — Other Ambulatory Visit: Payer: Medicare HMO

## 2018-01-12 ENCOUNTER — Ambulatory Visit
Admission: RE | Admit: 2018-01-12 | Discharge: 2018-01-12 | Disposition: A | Discharge status: Home / Self Care | Payer: Medicare HMO | Source: Ambulatory Visit | Attending: Gastroenterology | Admitting: Gastroenterology

## 2018-01-12 DIAGNOSIS — R1032 Left lower quadrant pain: Secondary | ICD-10-CM

## 2018-01-12 MED ORDER — IOPAMIDOL (ISOVUE-300) INJECTION 61%
100.0000 mL | Freq: Once | INTRAVENOUS | Status: AC | PRN
  Administered 2018-01-12: 100 mL via INTRAVENOUS

## 2018-01-15 ENCOUNTER — Emergency Department (HOSPITAL_COMMUNITY)
Admission: EM | Admit: 2018-01-15 | Discharge: 2018-01-15 | Disposition: A | Discharge status: Home / Self Care | Payer: Medicare HMO | Attending: Emergency Medicine | Admitting: Emergency Medicine

## 2018-01-15 ENCOUNTER — Other Ambulatory Visit: Payer: Self-pay

## 2018-01-15 ENCOUNTER — Encounter (HOSPITAL_COMMUNITY): Payer: Self-pay | Admitting: *Deleted

## 2018-01-15 DIAGNOSIS — Z7982 Long term (current) use of aspirin: Secondary | ICD-10-CM | POA: Insufficient documentation

## 2018-01-15 DIAGNOSIS — M542 Cervicalgia: Secondary | ICD-10-CM

## 2018-01-15 DIAGNOSIS — E039 Hypothyroidism, unspecified: Secondary | ICD-10-CM | POA: Diagnosis not present

## 2018-01-15 DIAGNOSIS — Z79899 Other long term (current) drug therapy: Secondary | ICD-10-CM | POA: Insufficient documentation

## 2018-01-15 LAB — BASIC METABOLIC PANEL
ANION GAP: 9 (ref 5–15)
BUN: 16 mg/dL (ref 8–23)
CALCIUM: 9 mg/dL (ref 8.9–10.3)
CO2: 25 mmol/L (ref 22–32)
CREATININE: 0.58 mg/dL (ref 0.44–1.00)
Chloride: 106 mmol/L (ref 98–111)
Glucose, Bld: 114 mg/dL — ABNORMAL HIGH (ref 70–99)
Potassium: 3.4 mmol/L — ABNORMAL LOW (ref 3.5–5.1)
Sodium: 140 mmol/L (ref 135–145)

## 2018-01-15 LAB — CBC WITH DIFFERENTIAL/PLATELET
ABS IMMATURE GRANULOCYTES: 0.1 10*3/uL (ref 0.0–0.1)
BASOS ABS: 0.1 10*3/uL (ref 0.0–0.1)
BASOS PCT: 1 %
EOS ABS: 0.2 10*3/uL (ref 0.0–0.7)
Eosinophils Relative: 3 %
HCT: 41.5 % (ref 36.0–46.0)
Hemoglobin: 13.9 g/dL (ref 12.0–15.0)
Immature Granulocytes: 1 %
Lymphocytes Relative: 33 %
Lymphs Abs: 2.7 10*3/uL (ref 0.7–4.0)
MCH: 30.7 pg (ref 26.0–34.0)
MCHC: 33.5 g/dL (ref 30.0–36.0)
MCV: 91.6 fL (ref 78.0–100.0)
Monocytes Absolute: 1 10*3/uL (ref 0.1–1.0)
Monocytes Relative: 12 %
NEUTROS ABS: 4.2 10*3/uL (ref 1.7–7.7)
Neutrophils Relative %: 50 %
PLATELETS: 283 10*3/uL (ref 150–400)
RBC: 4.53 MIL/uL (ref 3.87–5.11)
RDW: 14 % (ref 11.5–15.5)
WBC: 8.3 10*3/uL (ref 4.0–10.5)

## 2018-01-15 MED ORDER — DEXAMETHASONE SODIUM PHOSPHATE 10 MG/ML IJ SOLN
10.0000 mg | Freq: Once | INTRAMUSCULAR | Status: AC
  Administered 2018-01-15: 10 mg via INTRAVENOUS
  Filled 2018-01-15: qty 1

## 2018-01-15 MED ORDER — AMOXICILLIN 500 MG PO CAPS: 500.0000 mg | ORAL_CAPSULE | Freq: Three times a day (TID) | ORAL | 0 refills | Status: DC

## 2018-01-15 NOTE — ED Notes (Signed)
Patient c/o swelling to neck; this nurse is unable to determine as there is no baseline image for me to compare to. Patient c/o swollen lymph nodes, but unable to palpate such.

## 2018-01-15 NOTE — Discharge Instructions (Addendum)
Unknown cause of your symptoms.  You may need further imaging if symptoms persist.  Would treat with steroids and antibiotics at this time.  Given your history of rheumatological issues he may want to follow with the rheumatologist to see if you need further blood work.  Return to ED if you develop any difficulties breathing or swallowing or for any other reason.

## 2018-01-15 NOTE — ED Provider Notes (Signed)
[facility] EMERGENCY DEPARTMENT Provider Note   CSN: [id number] Arrival date & time: 01/15/18  0051     History   Chief Complaint Chief Complaint  Patient presents with  . Neck Pain    HPI Terry Shannon is a 62 y.o. female.  HPI 62 year old female past medical history significant for remote thyroid cancer with thyroidectomy, ulcerative colitis, rheumatoid arthritis that presents to the emergency department today for evaluation of swelling and pain to her neck.  Patient states that she ate dinner this evening was doing okay.  Before she went to sleep she started to get some pain to the anterior part of her neck and under her lymph nodes and she felt that they were swollen.  She came to the ED for evaluation.  She did not take anything for swelling or pain prior to arrival.  No history of same.  Patient reports minimal pain at this time.  She denies any associated fevers or chills.  Denies any difficulties breathing or swallowing.  Denies any recent travel.  Does have a history of rheumatoid arthritis and ulcerative colitis.  No new medications.  She reports a 71-month history of dry eyes and dry mouth.  Denies any sore throat or pain with swallowing.  Denies any dysphasia.  Pt denies any fever, chill, ha, vision changes, lightheadedness, dizziness, congestion,  cp, sob, cough, abd pain, n/v/d, urinary symptoms, change in bowel habits, melena, hematochezia, lower extremity paresthesias.  Past Medical History:  Diagnosis Date  . Allergy    year-round, pt. states  . Chronic lower back pain   . 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 Complication of anesthesia    slow to wake up after gallbladder surgery  . Depression   . Eczema    ARMS AND HANDS  . GERD (gastroesophageal reflux disease)   . History of thyroid cancer   . Hypothyroidism   . Migraines   . Osteoarthritis   . Overactive bladder   . RA (rheumatoid arthritis) ([place])   . Sleep apnea    no  CPAP use  . Ulcerative colitis [facility])     Patient Active Problem List   Diagnosis Date Noted  . Chronic migraine without aura, with intractable migraine, so stated, with status migrainosus 09/26/2015  . Radiculopathy 03/06/2014  . Osteoarthritis of right knee 04/01/2013  . OA (osteoarthritis) 11/01/2011  . OAB (overactive bladder)   . Ulcerative colitis ([place])   . Cancer ([place])   . Carpal tunnel syndrome   . RA (rheumatoid arthritis) ([place])     Past Surgical History:  Procedure Laterality Date  . ABDOMINAL HERNIA REPAIR  83/72/9021   periumbilical ventral hernia/incisional hernia  . BUNIONECTOMY Right    x 2 more  . BUNIONECTOMY Left   . BUNIONECTOMY WITH CHILECTOMY Right 12/16/2005  . CARPAL TUNNEL RELEASE Right 07/13/2001  . CARPAL TUNNEL RELEASE Left 08/10/2001  . DILATION AND CURETTAGE OF UTERUS    . HAMMER TOE SURGERY Left may 2014  . HYSTEROSCOPY W/D&C  03/01/2004   with exc. of endometrial polyp  . KNEE ARTHROSCOPY Right 2013  . LAPAROSCOPIC CHOLECYSTECTOMY  11/03/2006  . LUMBAR FUSION  03/06/2014   l4  l5   . THYROIDECTOMY, PARTIAL Right prior to 2002  . THYROIDECTOMY, PARTIAL Left 11/29/2000   and isthmus  . TONSILLECTOMY  1961  . TOTAL ANKLE REPLACEMENT Left 11/2016   with tendon repair  . TOTAL KNEE ARTHROPLASTY Right 04/01/2013   Procedure: RIGHT TOTAL KNEE  ARTHROPLASTY;  Surgeon: Alta Corning, MD;  Location: [facility];  Service: Orthopedics;  Laterality: Right;     OB History    Gravida  0   Para      Term      Preterm      AB      Living        SAB      TAB      Ectopic      Multiple      Live Births               Home Medications    Prior to Admission medications   Medication Sig Start Date End Date Taking? Authorizing Provider  ALPRAZolam Duanne Moron) 0.5 MG tablet Take 1 tablet (0.5 mg total) by mouth 3 (three) times daily as needed for anxiety. 10/04/17  Yes Melvenia Beam, MD  aspirin EC 325 MG tablet Take 325 mg by mouth every 6  (six) hours as needed for mild pain.   Yes [provider]  Cholecalciferol (VITAMIN D3 PO) Take 1 tablet by mouth daily.    Yes [provider]  cyclobenzaprine (FLEXERIL) 10 MG tablet Take 10 mg by mouth 3 (three) times daily as needed for muscle spasms.   Yes [provider]  desvenlafaxine (PRISTIQ) 100 MG 24 hr tablet Take 100 mg by mouth daily.    Yes [provider]  etanercept (ENBREL) 50 MG/ML injection Inject 50 mg into the skin once a week.   Yes [provider]  levothyroxine (SYNTHROID, LEVOTHROID) 25 MCG tablet Take 25 mcg by mouth daily before breakfast.    Yes [provider]  mesalamine (LIALDA) 1.2 G EC tablet Take 2.4 g by mouth 2 (two) times daily.    Yes [provider]  metoCLOPramide (REGLAN) 10 MG tablet Take 1 tablet (10 mg total) by mouth every 8 (eight) hours as needed for nausea. 10/06/17  Yes Melvenia Beam, MD  Omega-3 Fatty Acids (OMEGA 3 PO) Take 1,500 mg by mouth daily.   Yes [provider]  omeprazole (PRILOSEC) 40 MG capsule Take 40 mg by mouth daily. 10/30/17  Yes [provider]  oxybutynin (DITROPAN-XL) 10 MG 24 hr tablet Take 10 mg by mouth daily.   Yes [provider]  Probiotic Product (PROBIOTIC DAILY PO) Take 1 tablet by mouth daily.    Yes [provider]  propranolol (INDERAL) 20 MG tablet Take 1 tablet (20 mg total) by mouth 2 (two) times daily. 10/06/17  Yes Melvenia Beam, MD  SUMAtriptan (IMITREX) 100 MG tablet Take 1 tablet (100 mg total) by mouth once as needed for up to 1 dose. May repeat in 2 hours if headache persists or recurs. 10/06/17  Yes Melvenia Beam, MD  thyroid (ARMOUR) 90 MG tablet Take 90 mg by mouth daily.    Yes [provider]  TURMERIC PO Take 1 capsule by mouth 2 (two) times daily.   Yes [provider]  zolpidem (AMBIEN CR) 12.5 MG CR tablet Take 12.5 mg by mouth at bedtime.   Yes [provider]    zonisamide (ZONEGRAN) 100 MG capsule Take 1 capsule (100 mg total) by mouth daily. 10/06/17  Yes Melvenia Beam, MD    Family History Family History  Problem Relation Age of Onset  . Stroke Mother   . Cancer Father 59       lung  . Diabetes Brother   . Barrett's esophagus  Brother   . Cancer Maternal Grandmother 1       colon  . Stroke Maternal Grandmother   . Stroke Maternal Grandfather   . Diabetes Paternal Grandmother     Social History Social History   Tobacco Use  . Smoking status: Never Smoker  . Smokeless tobacco: Never Used  Substance Use Topics  . Alcohol use: Yes    Comment: 2-3 drinks/week  . Drug use: No     Allergies   Codeine; Hydrocodone; Ibuprofen; Iodinated diagnostic agents; Methotrexate derivatives; Oxycodone; Tylenol [acetaminophen]; Aleve [naproxen sodium]; Sulfa antibiotics; and Neosporin [neomycin-bacitracin zn-polymyx]   Review of Systems Review of Systems  All other systems reviewed and are negative.    Physical Exam Updated Vital Signs BP (!) 145/75   Pulse (!) 58   Temp 98.1 F (36.7 C) (Oral)   Resp 16   Ht 5\' 6"  (1.676 m)   Wt 86.2 kg   SpO2 98%   BMI 30.67 kg/m   Physical Exam  Constitutional: She appears well-developed and well-nourished. No distress.  HENT:  Head: Normocephalic and atraumatic.  Nose: Nose normal.  Mouth/Throat: Uvula is midline, oropharynx is clear and moist and mucous membranes are normal. No trismus in the jaw. No uvula swelling. No tonsillar exudate.  No angioedema.  Patient managing secretions tolerating her airway.  Posterior oropharynx is clear.  Eyes: Right eye exhibits no discharge. Left eye exhibits no discharge. No scleral icterus.  Neck: Normal range of motion and full passive range of motion without pain. Neck supple. No neck rigidity. No thyroid mass and no thyromegaly present.  No c spine midline tenderness. No paraspinal tenderness. No deformities or step offs noted. Full ROM. Supple. No  nuchal rigidity.    Cardiovascular: Normal rate, regular rhythm and normal heart sounds.  Pulmonary/Chest: Effort normal and breath sounds normal. No respiratory distress.  Musculoskeletal: Normal range of motion.  Lymphadenopathy:    She has cervical adenopathy.  Neurological: She is alert.  Skin: Skin is warm and dry. Capillary refill takes less than 2 seconds. No pallor.  Psychiatric: Her behavior is normal. Judgment and thought content normal.  Nursing note and vitals reviewed.    ED Treatments / Results  Labs (all labs ordered are listed, but only abnormal results are displayed) Labs Reviewed  BASIC METABOLIC PANEL - Abnormal; Notable for the following components:      Result Value   Potassium 3.4 (*)    Glucose, Bld 114 (*)    All other components within normal limits  CBC WITH DIFFERENTIAL/PLATELET    EKG None  Radiology No results found.  Procedures Procedures (including critical care time)  Medications Ordered in ED Medications  dexamethasone (DECADRON) injection 10 mg (10 mg Intravenous Given 01/15/18 0348)     Initial Impression / Assessment and Plan / ED Course  I have reviewed the triage vital signs and the nursing notes.  Pertinent labs & imaging results that were available during my care of the patient were reviewed by me and considered in my medical decision making (see chart for details).     Patient presents to the ED for evaluation of anterior neck swelling and pain onset earlier this evening.  Denies any difficulties breathing or swallowing.  Reports remote history of thyroid cancer with thyroidectomy.  Patient denies any associated fevers or chills.  On exam patient has some mild cervical lymphadenopathy.  Parotid glands are mildly enlarged bilaterally.  Oropharynx is clear.  No signs of deep neck infection or  peritonsillar abscess.  Managing secretions tolerating airway.  She is afebrile.  Basic lab work obtained that was reassuring without any  leukocytosis.  Patient was offered imaging of her neck however would like to try symptom medic treatment at home with close outpatient follow-up.  I feel this is reasonable given the patient has no dysphagia, difficulties breathing, difficulty swallowing.  She is managing secretions tolerating her airway.  Differential diagnosis includes cancer, Sjogren's disease, parotiditis, strep throat.  Will treat with amoxicillin and Decadron.  Pt is hemodynamically stable, in NAD, & able to ambulate in the ED. Evaluation does not show pathology that would require ongoing emergent intervention or inpatient treatment. I explained the diagnosis to the patient. Pain has been managed & has no complaints prior to dc. Pt is comfortable with above plan and is stable for discharge at this time. All questions were answered prior to disposition. Strict return precautions for f/u to the ED were discussed. Encouraged follow up with PCP.  She also seen and evaluated by my attending who is agreed with the above plan.  Final Clinical Impressions(s) / ED Diagnoses   Final diagnoses:  Neck pain    ED Discharge Orders         Ordered    amoxicillin (AMOXIL) 500 MG capsule  3 times daily     01/15/18 0338           Doristine Devoid, PA-C 01/15/18 0404    Varney Biles, MD 01/15/18 [phone]

## 2018-01-15 NOTE — ED Triage Notes (Signed)
Pt c/o bilateral neck pain that started after getting up off the couch. Also reports feeling like there is swelling the front of her neck/throat area. Pt denies new injuries, does have hx of back and neck problems

## 2018-03-23 ENCOUNTER — Encounter (HOSPITAL_COMMUNITY): Payer: Self-pay | Admitting: Emergency Medicine

## 2018-03-23 ENCOUNTER — Ambulatory Visit (HOSPITAL_COMMUNITY)
Admission: EM | Admit: 2018-03-23 | Discharge: 2018-03-23 | Disposition: A | Discharge status: Home / Self Care | Payer: Medicare HMO

## 2018-03-23 ENCOUNTER — Ambulatory Visit (INDEPENDENT_AMBULATORY_CARE_PROVIDER_SITE_OTHER): Payer: Medicare HMO

## 2018-03-23 DIAGNOSIS — R1031 Right lower quadrant pain: Secondary | ICD-10-CM | POA: Diagnosis not present

## 2018-03-23 DIAGNOSIS — K5900 Constipation, unspecified: Secondary | ICD-10-CM | POA: Diagnosis not present

## 2018-03-23 DIAGNOSIS — K5909 Other constipation: Secondary | ICD-10-CM

## 2018-03-23 LAB — POCT URINALYSIS DIP (DEVICE)
BILIRUBIN URINE: NEGATIVE
Glucose, UA: NEGATIVE mg/dL
Hgb urine dipstick: NEGATIVE
KETONES UR: NEGATIVE mg/dL
Leukocytes, UA: NEGATIVE
Nitrite: NEGATIVE
PH: 6 (ref 5.0–8.0)
Protein, ur: NEGATIVE mg/dL
SPECIFIC GRAVITY, URINE: 1.02 (ref 1.005–1.030)
Urobilinogen, UA: 0.2 mg/dL (ref 0.0–1.0)

## 2018-03-23 MED ORDER — POLYETHYLENE GLYCOL 3350 17 GM/SCOOP PO POWD: 1.0000 | Freq: Once | ORAL | 0 refills | Status: AC

## 2018-03-23 NOTE — ED Provider Notes (Signed)
[place]    CSN: [id number] Arrival date & time: 03/23/18  1222     History   Chief Complaint No chief complaint on file.   HPI Terry Shannon is a 62 y.o. female.   Patient is a 62 year old female with past medical history of chronic back pain eczema, depression, GERD, hypothyroidism, UC, RA.  She presents with right lower quadrant abdominal pain since yesterday evening.  Symptoms have been waxing and waning.  She thought maybe it was some gas so she tried to lay on a pillow but that made the pain worse.  She reports last bowel movement was yesterday morning and normal.  She does not suffer from constipation.  She denies any nausea, vomiting, diarrhea, fever, chills, dysuria, hematuria, urinary frequency.  She denies any injuries, heavy lifting or strenuous exercise.  She has not taken anything for her symptoms.  ROS per HPI      Past Medical History:  Diagnosis Date  . Allergy    year-round, pt. states  . Chronic lower back pain   . 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 Complication of anesthesia    slow to wake up after gallbladder surgery  . Depression   . Eczema    ARMS AND HANDS  . GERD (gastroesophageal reflux disease)   . History of thyroid cancer   . Hypothyroidism   . Migraines   . Osteoarthritis   . Overactive bladder   . RA (rheumatoid arthritis) ([place])   . Sleep apnea    no CPAP use  . Ulcerative colitis [facility])     Patient Active Problem List   Diagnosis Date Noted  . Chronic migraine without aura, with intractable migraine, so stated, with status migrainosus 09/26/2015  . Radiculopathy 03/06/2014  . Osteoarthritis of right knee 04/01/2013  . OA (osteoarthritis) 11/01/2011  . OAB (overactive bladder)   . Ulcerative colitis ([place])   . Cancer ([place])   . Carpal tunnel syndrome   . RA (rheumatoid arthritis) ([place])     Past Surgical History:  Procedure Laterality Date  . ABDOMINAL HERNIA REPAIR  40/98/1191   periumbilical  ventral hernia/incisional hernia  . BUNIONECTOMY Right    x 2 more  . BUNIONECTOMY Left   . BUNIONECTOMY WITH CHILECTOMY Right 12/16/2005  . CARPAL TUNNEL RELEASE Right 07/13/2001  . CARPAL TUNNEL RELEASE Left 08/10/2001  . DILATION AND CURETTAGE OF UTERUS    . HAMMER TOE SURGERY Left may 2014  . HYSTEROSCOPY W/D&C  03/01/2004   with exc. of endometrial polyp  . KNEE ARTHROSCOPY Right 2013  . LAPAROSCOPIC CHOLECYSTECTOMY  11/03/2006  . LUMBAR FUSION  03/06/2014   l4  l5   . THYROIDECTOMY, PARTIAL Right prior to 2002  . THYROIDECTOMY, PARTIAL Left 11/29/2000   and isthmus  . TONSILLECTOMY  1961  . TOTAL ANKLE REPLACEMENT Left 11/2016   with tendon repair  . TOTAL KNEE ARTHROPLASTY Right 04/01/2013   Procedure: RIGHT TOTAL KNEE ARTHROPLASTY;  Surgeon: Alta Corning, MD;  Location: [facility];  Service: Orthopedics;  Laterality: Right;    OB History    Gravida  0   Para      Term      Preterm      AB      Living        SAB      TAB      Ectopic      Multiple      Live Births  Home Medications    Prior to Admission medications   Medication Sig Start Date End Date Taking? Authorizing Provider  Rosuvastatin Calcium (CRESTOR PO) Take by mouth.   Yes [provider]  ALPRAZolam (XANAX) 0.5 MG tablet Take 1 tablet (0.5 mg total) by mouth 3 (three) times daily as needed for anxiety. 10/04/17   Melvenia Beam, MD  amoxicillin (AMOXIL) 500 MG capsule Take 1 capsule (500 mg total) by mouth 3 (three) times daily. 01/15/18   Doristine Devoid, PA-C  aspirin EC 325 MG tablet Take 325 mg by mouth every 6 (six) hours as needed for mild pain.    [provider]  Cholecalciferol (VITAMIN D3 PO) Take 1 tablet by mouth daily.     [provider]  cyclobenzaprine (FLEXERIL) 10 MG tablet Take 10 mg by mouth 3 (three) times daily as needed for muscle spasms.    [provider]  desvenlafaxine (PRISTIQ) 100 MG 24 hr tablet Take 100 mg by  mouth daily.     [provider]  etanercept (ENBREL) 50 MG/ML injection Inject 50 mg into the skin once a week.    [provider]  levothyroxine (SYNTHROID, LEVOTHROID) 25 MCG tablet Take 25 mcg by mouth daily before breakfast.     [provider]  mesalamine (LIALDA) 1.2 G EC tablet Take 2.4 g by mouth 2 (two) times daily.     [provider]  metoCLOPramide (REGLAN) 10 MG tablet Take 1 tablet (10 mg total) by mouth every 8 (eight) hours as needed for nausea. 10/06/17   Melvenia Beam, MD  Omega-3 Fatty Acids (OMEGA 3 PO) Take 1,500 mg by mouth daily.    [provider]  omeprazole (PRILOSEC) 40 MG capsule Take 40 mg by mouth daily. 10/30/17   [provider]  oxybutynin (DITROPAN-XL) 10 MG 24 hr tablet Take 10 mg by mouth daily.    [provider]  polyethylene glycol powder (GLYCOLAX/MIRALAX) powder Take 255 g by mouth once for 1 dose. 03/23/18 03/23/18  Loura Halt A, NP  Probiotic Product (PROBIOTIC DAILY PO) Take 1 tablet by mouth daily.     [provider]  propranolol (INDERAL) 20 MG tablet Take 1 tablet (20 mg total) by mouth 2 (two) times daily. 10/06/17   Melvenia Beam, MD  SUMAtriptan (IMITREX) 100 MG tablet Take 1 tablet (100 mg total) by mouth once as needed for up to 1 dose. May repeat in 2 hours if headache persists or recurs. 10/06/17   Melvenia Beam, MD  thyroid (ARMOUR) 90 MG tablet Take 90 mg by mouth daily.     [provider]  TURMERIC PO Take 1 capsule by mouth 2 (two) times daily.    [provider]  zolpidem (AMBIEN CR) 12.5 MG CR tablet Take 12.5 mg by mouth at bedtime.    [provider]  zonisamide (ZONEGRAN) 100 MG capsule Take 1 capsule (100 mg total) by mouth daily. 10/06/17   Melvenia Beam, MD    Family History Family History  Problem Relation Age of Onset  . Stroke Mother   . Cancer Father 66       lung  . Diabetes Brother   . Barrett's esophagus Brother     . Cancer Maternal Grandmother 51       colon  . Stroke Maternal Grandmother   . Stroke Maternal Grandfather   . Diabetes Paternal Grandmother     Social History Social History  Tobacco Use  . Smoking status: Never Smoker  . Smokeless tobacco: Never Used  Substance Use Topics  . Alcohol use: Yes    Comment: 2-3 drinks/week  . Drug use: No     Allergies   Codeine; Hydrocodone; Ibuprofen; Iodinated diagnostic agents; Methotrexate derivatives; Oxycodone; Tylenol [acetaminophen]; Aleve [naproxen sodium]; Sulfa antibiotics; and Neosporin [neomycin-bacitracin zn-polymyx]   Review of Systems Review of Systems   Physical Exam Triage Vital Signs ED Triage Vitals [03/23/18 1341]  Enc Vitals Group     BP (!) 149/80     Pulse Rate 60     Resp 16     Temp 98.1 F (36.7 C)     Temp src      SpO2 100 %     Weight      Height      Head Circumference      Peak Flow      Pain Score 4     Pain Loc      Pain Edu?      Excl. in [place]?    No data found.  Updated Vital Signs BP (!) 149/80   Pulse 60   Temp 98.1 F (36.7 C)   Resp 16   SpO2 100%   Visual Acuity Right Eye Distance:   Left Eye Distance:   Bilateral Distance:    Right Eye Near:   Left Eye Near:    Bilateral Near:     Physical Exam  Constitutional: She appears well-developed and well-nourished.  Very pleasant. Non toxic or ill appearing.   HENT:  Head: Normocephalic and atraumatic.  Eyes: Conjunctivae are normal.  Neck: Normal range of motion.  Abdominal: Soft. Bowel sounds are normal. She exhibits no distension and no mass. There is tenderness. There is no rebound and no guarding. No hernia.  Moderate tenderness to right mid abdomen and lower quadrant.  No rebound tenderness.  Negative psoas.  Slight pain elicited with applying pressure to right leg.  Musculoskeletal: Normal range of motion.  Neurological: She is alert.  Skin: Skin is warm and dry.  Psychiatric: She has a normal mood and affect.   Nursing note and vitals reviewed.    UC Treatments / Results  Labs (all labs ordered are listed, but only abnormal results are displayed) Labs Reviewed  POCT URINALYSIS DIP (DEVICE)    EKG None  Radiology Dg Abd 1 View  Result Date: 03/23/2018 CLINICAL DATA:  Right lower quadrant pain since yesterday. EXAM: ABDOMEN - 1 VIEW COMPARISON:  CT 2319, lumbar spine MRI 02/17/2014 FINDINGS: Increased stool retention within the right colon and cecum is noted. Cholecystectomy clips are present in the right upper quadrant. Calcified uterine fibroid is seen in the right hemipelvis. Posterior lumbar fusion hardware across the L4-5 interspace is redemonstrated without complicating features. Mild levoconvex curvature of the lumbar spine from L2 through S1 with moderate disc flattening noted. Atherosclerosis of the abdominal aorta without aneurysm. Mild degenerative joint space narrowing of both hips. No acute pelvic fracture or suspicious osseous lesions. IMPRESSION: 1. Increased stool burden within the right colon may be contributing to the patient's right lower quadrant pain. No bowel obstruction is seen. 2. Calcified fibroid uterus in the right hemipelvis. Electronically Signed   By: Ashley Royalty M.D.   On: 03/23/2018 14:22    Procedures Procedures (including critical care time)  Medications Ordered in UC Medications - No data to display  Initial Impression / Assessment and Plan / UC Course  I have reviewed the  triage vital signs and the nursing notes.  Pertinent labs & imaging results that were available during my care of the patient were reviewed by me and considered in my medical decision making (see chart for details).     X-ray revealed moderate stool burden and area of patient's pain in the right colon. We will go ahead and treat for constipation with MiraLAX Urine was negative for infection Exam was not consistent with appendicitis For continued or worsening symptoms instructed to go  to the ER Final Clinical Impressions(s) / UC Diagnoses   Final diagnoses:  Other constipation     Discharge Instructions     It was nice meeting you!!  Your x ray revealed constipation.  You can do miralax twice a day until you have a good bowel movement.  For continued or worsening symptoms please go to the ER Urine was negative for infection.     ED Prescriptions    Medication Sig Dispense Auth. Provider   polyethylene glycol powder (GLYCOLAX/MIRALAX) powder Take 255 g by mouth once for 1 dose. 255 g Loura Halt A, NP     Controlled Substance Prescriptions [place] Controlled Substance Registry consulted? Not Applicable   Orvan July, NP 03/23/18 1444

## 2018-03-23 NOTE — ED Triage Notes (Signed)
Pt c/o RLQ abdominal pain and tenderness since yesterday.

## 2018-03-23 NOTE — Discharge Instructions (Addendum)
It was nice meeting you!!  Your x ray revealed constipation.  You can do miralax twice a day until you have a good bowel movement.  For continued or worsening symptoms please go to the ER Urine was negative for infection.

## 2018-09-25 ENCOUNTER — Other Ambulatory Visit: Payer: Self-pay | Admitting: Neurology

## 2018-09-25 DIAGNOSIS — G43711 Chronic migraine without aura, intractable, with status migrainosus: Secondary | ICD-10-CM

## 2018-09-26 ENCOUNTER — Other Ambulatory Visit: Payer: Self-pay | Admitting: Neurology

## 2018-09-26 DIAGNOSIS — G43711 Chronic migraine without aura, intractable, with status migrainosus: Secondary | ICD-10-CM

## 2018-10-08 ENCOUNTER — Ambulatory Visit: Payer: Medicare HMO | Admitting: Neurology

## 2018-10-08 DIAGNOSIS — R5382 Chronic fatigue, unspecified: Secondary | ICD-10-CM | POA: Diagnosis not present

## 2018-10-08 DIAGNOSIS — K5180 Other ulcerative colitis without complications: Secondary | ICD-10-CM | POA: Diagnosis not present

## 2018-10-08 DIAGNOSIS — M2550 Pain in unspecified joint: Secondary | ICD-10-CM | POA: Diagnosis not present

## 2018-10-08 DIAGNOSIS — M150 Primary generalized (osteo)arthritis: Secondary | ICD-10-CM | POA: Diagnosis not present

## 2018-10-08 DIAGNOSIS — M0579 Rheumatoid arthritis with rheumatoid factor of multiple sites without organ or systems involvement: Secondary | ICD-10-CM | POA: Diagnosis not present

## 2018-10-10 ENCOUNTER — Telehealth: Payer: Self-pay | Admitting: *Deleted

## 2018-10-10 ENCOUNTER — Encounter: Payer: Self-pay | Admitting: *Deleted

## 2018-10-10 NOTE — Telephone Encounter (Signed)
I spoke with the pt. Confirmed pt using 2 identifiers and updated pt's chart. Pt reported no changes to PMH, PSH, FH since 03/2018. Medications updated. No changes to allergies. Pt stated she did receive her link. She understands she will receive a call 30 minutes prior to the 8:30 virtual visit. Pt stated her insurance has changed and is aware this can be updated during that call.

## 2018-10-11 ENCOUNTER — Ambulatory Visit (INDEPENDENT_AMBULATORY_CARE_PROVIDER_SITE_OTHER): Payer: Medicare Other | Admitting: Neurology

## 2018-10-11 ENCOUNTER — Other Ambulatory Visit: Payer: Self-pay

## 2018-10-11 DIAGNOSIS — G43711 Chronic migraine without aura, intractable, with status migrainosus: Secondary | ICD-10-CM

## 2018-10-11 DIAGNOSIS — M5417 Radiculopathy, lumbosacral region: Secondary | ICD-10-CM

## 2018-10-11 NOTE — Progress Notes (Signed)
[facility]    Provider:  Dr Jaynee Eagles Referring Provider: Willey Blade, MD Primary Care Physician:  Willey Blade, MD   CC:  Migraine  Virtual Visit via Telephone Note  I connected with Terry Shannon on 10/12/18 at  8:30 AM EDT by telephone and verified that I am speaking with the correct person using two identifiers.  Location: Patient: home Provider: office   I discussed the limitations, risks, security and privacy concerns of performing an evaluation and management service by telephone and the availability of in person appointments. I also discussed with the patient that there may be a patient responsible charge related to this service. The patient expressed understanding and agreed to proceed.   Follow Up Instructions:    I discussed the assessment and treatment plan with the patient. The patient was provided an opportunity to ask questions and all were answered. The patient agreed with the plan and demonstrated an understanding of the instructions.   The patient was advised to call back or seek an in-person evaluation if the symptoms worsen or if the condition fails to improve as anticipated.  I provided 22 minutes of non-face-to-face time during this encounter.   Melvenia Beam, MD    Interval history 10/11/2018: Patient continues to do well.  She still has exertional headaches when she walks and her blood pressure runs "a little on the high side".  We discussed increasing the propranolol to 80 mg as this is a very good headache/migraine medication, and can help with her slightly elevated high blood pressure.  We also discussed she takes the benzodiazepine very rarely, before procedures, sometimes for anxiety, advised her to continue to take it very sparingly and situationally, discussed addiction and overuse and risks of respiratory depression.  Also discussed side effects of propranolol.  We will continue her zonisamide for her migraines.  We  will continue Reglan and Imitrex as tolerated for acute management.  10/04/2017: She is doing exceptionally well. She tolerates the medication. She is very thankful. Discussed her current regimen, the new medications anti-CGRP meds should she every need them, also botox for migraine and other preventative and acute management but she very happy as is.  Discussed that she now gets headaches and moigraines after her ESI or LBP, unclear if it is the anxiety of the shot, discussed options will give xanax prn   10/04/2016: Patient is here for chronic migraines. She is improved with regard to the migraines. She still has exertional headaches. Her blood pressure is slightly elevated. Discussed increasing propranolol for exertional headaches and may also help with hypertension she should be monitoring her blood pressure daily. Discussed exertional headaches. Discussed hypertension and following up with her primary care for management. She is doing well on Zonisamide. We'll continue 100 mg in the evenings. Discussed headache triggers, environmental triggers, stress triggers and lifestyle.  HPI:  Terry Shannon is a 63 y.o. female here as a second opinion from my colleague Dr. Brett Fairy  for migraines. She has had the migraines since the age of 65. Certain foods and drinks are triggers and she has taken those out of her diet, like wine and aspertame. The headaches start in the back of her neck, it gets tight then spreads to the left side of her head. It is poinding, throbbing, pain behind the eye temple pounding, +light sensitivity, +sound sensitivity, + nausea, no vomiting in over 10 years. Another trigger is exercise. When she exercise she will get a migraine that day. No medicaton overuse  headache. Has been going on for years at this frequency. She has 16 migraines a month, they can last for 1-3 days, on average they are 8/10 in severity. They started years ago after a myelogram. No FHx of migraines. She was recently  started on Zonegran by Dr. Roddie Mc which she feels may be helping. Reports that she has had normal brain imaging in the past.  Tried: Topamax, amitriptyline, propranolol and she has tried a lot that she can't remember and was on these for years, stopped working.Taking Zonegran now. She uses flexeril and an aspirin now on onset which seems to help a little. Imitrex used to help but stopped helping. She tried zomig and mazalt and relpax.  Has contraindication to NSAIDs but still takes aspirin as this is the only thing that appears to help acutely.   Reviewed notes, labs and imaging from outside physicians, which showed: Personally reviewed images and agree with the following  Vertebral Arteries: BILATERAL patent, LEFT dominant.  Paraspinal tissues: Paravertebral anterior soft tissues are obscured by saturation bands. No visible adenopathy.  Disc levels:  The individual disc spaces were examined as follows:  C2-3: Normal disc space. Trace anterolisthesis. Severe left-sided facet arthropathy. No foraminal narrowing.  C3-4: Moderately severe disc space narrowing. Fusion across the facet joints. No impingement.  C4-5: Central disc osteophyte complex. Trace anterolisthesis. No impingement.  C5-6: Moderate disc space narrowing. Asymmetric foraminal narrowing on the RIGHT due to uncinate spurring. RIGHT C6 nerve root impingement not excluded.  C6-7: Central disc osteophyte complex. No stenosis or foraminal narrowing.  C7-T1: 3 mm anterolisthesis is facet mediated. Advanced facet arthropathy without impingement.  IMPRESSION: Multilevel spondylosis as described. No significant left-sided foraminal narrowing or leftward disc protrusion.  Asymmetric facet arthropathy on the LEFT is most notable at C2-3. Correlate clinically for facet mediated upper extremity symptoms.  Review of Systems: Patient complains of symptoms per HPI as well as the following symptoms: Hearing loss, ear pain,  runny nose, environmental allergies, joint pain, joint swelling, back pain, aching muscles, walking difficulty, neck pain, neck stiffness, headache. Pertinent negatives per HPI. All others negative.    Social History   Socioeconomic History   Marital status: Significant Other    Spouse name: Not on file   Number of children: Not on file   Years of education: Not on file   Highest education level: Bachelor's degree (e.g., BA, AB, BS)  Occupational History   Not on file  Social Needs   Financial resource strain: Not on file   Food insecurity:    Worry: Not on file    Inability: Not on file   Transportation needs:    Medical: Not on file    Non-medical: Not on file  Tobacco Use   Smoking status: Never Smoker   Smokeless tobacco: Never Used  Substance and Sexual Activity   Alcohol use: Yes    Comment: 2-3 drinks/week   Drug use: No   Sexual activity: Not Currently    Birth control/protection: None  Lifestyle   Physical activity:    Days per week: Not on file    Minutes per session: Not on file   Stress: Not on file  Relationships   Social connections:    Talks on phone: Not on file    Gets together: Not on file    Attends religious service: Not on file    Active member of club or organization: Not on file    Attends meetings of clubs or organizations: Not on  file    Relationship status: Not on file   Intimate partner violence:    Fear of current or ex partner: Not on file    Emotionally abused: Not on file    Physically abused: Not on file    Forced sexual activity: Not on file  Other Topics Concern   Not on file  Social History Narrative   Lives at home with her partner and one daughter   Right handed   Caffeine: 1 cup in the morning    Family History  Problem Relation Age of Onset   Stroke Mother    Cancer Father 33       lung   Diabetes Brother    Barrett's esophagus Brother    Cancer Maternal Grandmother 3       colon    Stroke Maternal Grandmother    Stroke Maternal Grandfather    Diabetes Paternal Grandmother     Past Medical History:  Diagnosis Date   Allergy    year-round, pt. states   Chronic lower back pain    CMC arthritis, thumb, degenerative 06/7515   right   Complication of anesthesia    slow to wake up after gallbladder surgery   Depression    Eczema    ARMS AND HANDS   GERD (gastroesophageal reflux disease)    History of thyroid cancer    Hypothyroidism    Migraines    Osteoarthritis    Overactive bladder    RA (rheumatoid arthritis) ([place])    Sleep apnea    no CPAP use   Ulcerative colitis ([place])     Past Surgical History:  Procedure Laterality Date   ABDOMINAL HERNIA REPAIR  00/17/4944   periumbilical ventral hernia/incisional hernia   BUNIONECTOMY Right    x 2 more   BUNIONECTOMY Left    BUNIONECTOMY WITH CHILECTOMY Right 12/16/2005   CARPAL TUNNEL RELEASE Right 07/13/2001   CARPAL TUNNEL RELEASE Left 08/10/2001   DILATION AND CURETTAGE OF UTERUS     HAMMER TOE SURGERY Left may 2014   HYSTEROSCOPY W/D&C  03/01/2004   with exc. of endometrial polyp   KNEE ARTHROSCOPY Right 2013   LAPAROSCOPIC CHOLECYSTECTOMY  11/03/2006   LUMBAR FUSION  03/06/2014   l4  l5    THYROIDECTOMY, PARTIAL Right prior to 2002   THYROIDECTOMY, PARTIAL Left 11/29/2000   and isthmus   TONSILLECTOMY  1961   TOTAL ANKLE REPLACEMENT Left 11/2016   with tendon repair   TOTAL KNEE ARTHROPLASTY Right 04/01/2013   Procedure: RIGHT TOTAL KNEE ARTHROPLASTY;  Surgeon: Alta Corning, MD;  Location: [facility];  Service: Orthopedics;  Laterality: Right;    Current Outpatient Medications  Medication Sig Dispense Refill   Acetylcysteine (NAC PO) Take 1 tablet by mouth daily.     ALPRAZolam (XANAX) 0.5 MG tablet Take 1 tablet (0.5 mg total) by mouth 3 (three) times daily as needed for anxiety. 30 tablet 2   Ascorbic Acid (VITAMIN C PO) Take 6,000 mg by mouth daily.      aspirin EC 325 MG tablet Take 325 mg by mouth 2 (two) times daily as needed for mild pain.      B Complex-C (SUPER B COMPLEX PO) Take 1 tablet by mouth daily.     Cholecalciferol (VITAMIN D3 PO) Take 1 tablet by mouth daily.      Coenzyme Q10 (COQ-10 PO) Take 10 mLs by mouth daily.     cyclobenzaprine (FLEXERIL) 10 MG tablet Take 10 mg by mouth  3 (three) times daily as needed for muscle spasms.     desvenlafaxine (PRISTIQ) 100 MG 24 hr tablet Take 100 mg by mouth daily.      etanercept (ENBREL) 50 MG/ML injection Inject 50 mg into the skin once a week.     Fexofenadine HCl (ALLEGRA PO) Take by mouth.     Flax OIL Take by mouth.     INTRAROSA 6.5 MG INST Place 1 tablet vaginally at bedtime.     levothyroxine (SYNTHROID) 50 MCG tablet Take 50 mcg by mouth daily before breakfast.     mesalamine (LIALDA) 1.2 G EC tablet Take 2.4 g by mouth 2 (two) times daily.      metoCLOPramide (REGLAN) 10 MG tablet Take 1 tablet (10 mg total) by mouth every 8 (eight) hours as needed. Take to treat migraines and/or nausea. 20 tablet 12   Omega-3 Fatty Acids (OMEGA 3 PO) Take 1,200 mg by mouth daily.      omeprazole (PRILOSEC) 40 MG capsule Take 40 mg by mouth daily.     OVER THE COUNTER MEDICATION Organic elderberry     oxybutynin (DITROPAN-XL) 10 MG 24 hr tablet Take 10 mg by mouth daily.     Probiotic Product (PROBIOTIC DAILY PO) Take 1 tablet by mouth daily.      propranolol ER (INDERAL LA) 80 MG 24 hr capsule Take 1 capsule (80 mg total) by mouth at bedtime. For migraine prevention and high blood pressure. 90 capsule 4   RESTASIS MULTIDOSE 0.05 % ophthalmic emulsion      Rosuvastatin Calcium (CRESTOR PO) Take 20 mg by mouth daily.      SUMAtriptan (IMITREX) 100 MG tablet Take 1 tablet (100 mg total) by mouth once as needed for up to 1 dose for migraine. May repeat in 2 hours if headache persists or recurs. 10 tablet 12   thyroid (ARMOUR) 90 MG tablet Take 90 mg by mouth daily.       TURMERIC PO Take 1 capsule by mouth 2 (two) times daily.     zolpidem (AMBIEN CR) 12.5 MG CR tablet Take 12.5 mg by mouth at bedtime.     zonisamide (ZONEGRAN) 100 MG capsule Take 1 capsule (100 mg total) by mouth daily. For migraine prevention. 90 capsule 4   No current facility-administered medications for this visit.     Allergies as of 10/11/2018 - Review Complete 10/10/2018  Allergen Reaction Noted   Codeine Nausea And Vomiting 09/09/2010   Hydrocodone Nausea And Vomiting 08/08/2012   Ibuprofen Other (See Comments) 08/08/2012   Iodinated diagnostic agents Hives and Itching 09/16/2010   Methotrexate derivatives Other (See Comments) 08/08/2012   Oxycodone Nausea And Vomiting 04/01/2013   Tylenol [acetaminophen] Other (See Comments) 08/08/2012   Aleve [naproxen sodium]     Sulfa antibiotics  11/01/2011   Neosporin [neomycin-bacitracin zn-polymyx] Swelling 07/18/2013    Vitals: There were no vitals taken for this visit. Last Weight:  Wt Readings from Last 1 Encounters:  10/10/18 172 lb (78 kg)   Last Height:   Ht Readings from Last 1 Encounters:  10/10/18 5' 5.5" (1.664 m)   PRIOR EXAM  Physical exam: Exam: Gen: NAD, conversant, well nourised, obese, well groomed                     Eyes: Conjunctivae clear without exudates or hemorrhage  Neuro: Detailed Neurologic Exam  Speech:    Speech is normal; fluent and spontaneous with normal comprehension.  Cognition:  The patient is oriented to person, place, and time;     Cranial Nerves: Extraocular movements are intact. Trigeminal sensation is intact and the muscles of mastication are normal. The face is symmetric. The palate elevates in the midline. Hearing intact. Voice is normal. Shoulder shrug is normal. The tongue has normal motion without fasciculations.   Gait:     antalgic with a cane  Motor Observation:    No asymmetry, no atrophy, and no involuntary movements noted. Tone:    Normal muscle  tone.    Posture:    Posture is normal. normal erect    Strength:    Strength is V/V in the upper and lower limbs.        Assessment/Plan:  63 year old female with chronic migraines, exertional headaches and hypertension.  - For her exercise-induced headaches as well as migraine prevention: propranolol 80mg  qhs. No side effects when on propranolol 20bid, increase - Continue zonisamide to 100 mg each evening for migraine prevention - On onset of headaache, imitrex and reglan. I discussed Reglan side effects including tardive dyskinesias and other movement disorders. - For her hypertension, propranolol will help I advised her to follow up with her primary care and that she take her blood pressure daily. - Chronic low back pain: She will get a migraine after her ESI, unclear if this is due to anxiety, will give her as needed xanax before her ESI, do not drive  Meds ordered this encounter  Medications   propranolol ER (INDERAL LA) 80 MG 24 hr capsule    Sig: Take 1 capsule (80 mg total) by mouth at bedtime. For migraine prevention and high blood pressure.    Dispense:  90 capsule    Refill:  4   zonisamide (ZONEGRAN) 100 MG capsule    Sig: Take 1 capsule (100 mg total) by mouth daily. For migraine prevention.    Dispense:  90 capsule    Refill:  4   DISCONTD: ALPRAZolam (XANAX) 0.5 MG tablet    Sig: Take 1 tablet (0.5 mg total) by mouth 3 (three) times daily as needed for anxiety.    Dispense:  30 tablet    Refill:  2   metoCLOPramide (REGLAN) 10 MG tablet    Sig: Take 1 tablet (10 mg total) by mouth every 8 (eight) hours as needed. Take to treat migraines and/or nausea.    Dispense:  20 tablet    Refill:  12   SUMAtriptan (IMITREX) 100 MG tablet    Sig: Take 1 tablet (100 mg total) by mouth once as needed for up to 1 dose for migraine. May repeat in 2 hours if headache persists or recurs.    Dispense:  10 tablet    Refill:  12   ALPRAZolam (XANAX) 0.5 MG tablet    Sig:  Take 1 tablet (0.5 mg total) by mouth 3 (three) times daily as needed for anxiety.    Dispense:  30 tablet    Refill:  2     Discussed: To prevent or relieve headaches, try the following: Cool Compress. Lie down and place a cool compress on your head.  Avoid headache triggers. If certain foods or odors seem to have triggered your migraines in the past, avoid them. A headache diary might help you identify triggers.  Include physical activity in your daily routine. Try a daily walk or other moderate aerobic exercise.  Manage stress. Find healthy ways to cope with the stressors, such as delegating tasks on your to-do  list.  Practice relaxation techniques. Try deep breathing, yoga, massage and visualization.  Eat regularly. Eating regularly scheduled meals and maintaining a healthy diet might help prevent headaches. Also, drink plenty of fluids.  Follow a regular sleep schedule. Sleep deprivation might contribute to headaches Consider biofeedback. With this mind-body technique, you learn to control certain bodily functions -- such as muscle tension, heart rate and blood pressure -- to prevent headaches or reduce headache pain.    Proceed to emergency room if you experience new or worsening symptoms or symptoms do not resolve, if you have new neurologic symptoms or if headache is severe, or for any concerning symptom.   Provided education and documentation from American headache Society toolbox including articles on: chronic migraine medication overuse headache, chronic migraines, prevention of migraines, behavioral and other nonpharmacologic treatments for headache.    Sarina Ill, MD  [facility] [address] [place], [place] [address]  Phone [phone] Fax [phone]

## 2018-10-12 MED ORDER — SUMATRIPTAN SUCCINATE 100 MG PO TABS: 100.0000 mg | ORAL_TABLET | Freq: Once | ORAL | 12 refills | Status: DC | PRN

## 2018-10-12 MED ORDER — PROPRANOLOL HCL ER 80 MG PO CP24: 80.0000 mg | ORAL_CAPSULE | Freq: Every day | ORAL | 4 refills | Status: DC

## 2018-10-12 MED ORDER — ZONISAMIDE 100 MG PO CAPS: 100.0000 mg | ORAL_CAPSULE | Freq: Every day | ORAL | 4 refills | Status: DC

## 2018-10-12 MED ORDER — ALPRAZOLAM 0.5 MG PO TABS: 0.5000 mg | ORAL_TABLET | Freq: Three times a day (TID) | ORAL | 2 refills | Status: DC | PRN

## 2018-10-12 MED ORDER — METOCLOPRAMIDE HCL 10 MG PO TABS: 10.0000 mg | ORAL_TABLET | Freq: Three times a day (TID) | ORAL | 12 refills | Status: DC | PRN

## 2018-12-03 DIAGNOSIS — M19172 Post-traumatic osteoarthritis, left ankle and foot: Secondary | ICD-10-CM | POA: Diagnosis not present

## 2018-12-03 DIAGNOSIS — Z471 Aftercare following joint replacement surgery: Secondary | ICD-10-CM | POA: Diagnosis not present

## 2018-12-03 DIAGNOSIS — Z96662 Presence of left artificial ankle joint: Secondary | ICD-10-CM | POA: Diagnosis not present

## 2018-12-04 DIAGNOSIS — E785 Hyperlipidemia, unspecified: Secondary | ICD-10-CM | POA: Diagnosis not present

## 2018-12-04 DIAGNOSIS — Z20828 Contact with and (suspected) exposure to other viral communicable diseases: Secondary | ICD-10-CM | POA: Diagnosis not present

## 2018-12-04 DIAGNOSIS — E890 Postprocedural hypothyroidism: Secondary | ICD-10-CM | POA: Diagnosis not present

## 2019-02-26 DIAGNOSIS — Z23 Encounter for immunization: Secondary | ICD-10-CM | POA: Diagnosis not present

## 2019-03-05 ENCOUNTER — Other Ambulatory Visit: Payer: Self-pay | Admitting: Internal Medicine

## 2019-03-05 DIAGNOSIS — R7309 Other abnormal glucose: Secondary | ICD-10-CM | POA: Diagnosis not present

## 2019-03-05 DIAGNOSIS — E2839 Other primary ovarian failure: Secondary | ICD-10-CM

## 2019-03-05 DIAGNOSIS — R1013 Epigastric pain: Secondary | ICD-10-CM | POA: Diagnosis not present

## 2019-03-05 DIAGNOSIS — E785 Hyperlipidemia, unspecified: Secondary | ICD-10-CM | POA: Diagnosis not present

## 2019-03-05 DIAGNOSIS — K219 Gastro-esophageal reflux disease without esophagitis: Secondary | ICD-10-CM | POA: Diagnosis not present

## 2019-03-05 DIAGNOSIS — K51018 Ulcerative (chronic) pancolitis with other complication: Secondary | ICD-10-CM | POA: Diagnosis not present

## 2019-03-05 DIAGNOSIS — Z1231 Encounter for screening mammogram for malignant neoplasm of breast: Secondary | ICD-10-CM

## 2019-03-05 DIAGNOSIS — Z1211 Encounter for screening for malignant neoplasm of colon: Secondary | ICD-10-CM | POA: Diagnosis not present

## 2019-03-05 DIAGNOSIS — E559 Vitamin D deficiency, unspecified: Secondary | ICD-10-CM | POA: Diagnosis not present

## 2019-03-05 DIAGNOSIS — E890 Postprocedural hypothyroidism: Secondary | ICD-10-CM | POA: Diagnosis not present

## 2019-03-26 DIAGNOSIS — M47816 Spondylosis without myelopathy or radiculopathy, lumbar region: Secondary | ICD-10-CM | POA: Diagnosis not present

## 2019-04-04 DIAGNOSIS — M47816 Spondylosis without myelopathy or radiculopathy, lumbar region: Secondary | ICD-10-CM | POA: Diagnosis not present

## 2019-04-05 DIAGNOSIS — M47812 Spondylosis without myelopathy or radiculopathy, cervical region: Secondary | ICD-10-CM | POA: Diagnosis not present

## 2019-04-08 DIAGNOSIS — S161XXD Strain of muscle, fascia and tendon at neck level, subsequent encounter: Secondary | ICD-10-CM | POA: Diagnosis not present

## 2019-04-10 DIAGNOSIS — R5382 Chronic fatigue, unspecified: Secondary | ICD-10-CM | POA: Diagnosis not present

## 2019-04-10 DIAGNOSIS — S161XXD Strain of muscle, fascia and tendon at neck level, subsequent encounter: Secondary | ICD-10-CM | POA: Diagnosis not present

## 2019-04-10 DIAGNOSIS — M150 Primary generalized (osteo)arthritis: Secondary | ICD-10-CM | POA: Diagnosis not present

## 2019-04-10 DIAGNOSIS — E663 Overweight: Secondary | ICD-10-CM | POA: Diagnosis not present

## 2019-04-10 DIAGNOSIS — K5180 Other ulcerative colitis without complications: Secondary | ICD-10-CM | POA: Diagnosis not present

## 2019-04-10 DIAGNOSIS — Z6828 Body mass index (BMI) 28.0-28.9, adult: Secondary | ICD-10-CM | POA: Diagnosis not present

## 2019-04-10 DIAGNOSIS — M2550 Pain in unspecified joint: Secondary | ICD-10-CM | POA: Diagnosis not present

## 2019-04-10 DIAGNOSIS — M0579 Rheumatoid arthritis with rheumatoid factor of multiple sites without organ or systems involvement: Secondary | ICD-10-CM | POA: Diagnosis not present

## 2019-04-16 DIAGNOSIS — S161XXD Strain of muscle, fascia and tendon at neck level, subsequent encounter: Secondary | ICD-10-CM | POA: Diagnosis not present

## 2019-04-23 DIAGNOSIS — S161XXD Strain of muscle, fascia and tendon at neck level, subsequent encounter: Secondary | ICD-10-CM | POA: Diagnosis not present

## 2019-04-30 DIAGNOSIS — Z20828 Contact with and (suspected) exposure to other viral communicable diseases: Secondary | ICD-10-CM | POA: Diagnosis not present

## 2019-05-02 ENCOUNTER — Ambulatory Visit: Payer: Medicare Other | Admitting: Family Medicine

## 2019-05-28 DIAGNOSIS — Z20828 Contact with and (suspected) exposure to other viral communicable diseases: Secondary | ICD-10-CM | POA: Diagnosis not present

## 2019-06-07 ENCOUNTER — Ambulatory Visit: Payer: Medicare HMO

## 2019-06-07 ENCOUNTER — Other Ambulatory Visit: Payer: Medicare HMO

## 2019-07-01 ENCOUNTER — Other Ambulatory Visit: Payer: Self-pay

## 2019-07-01 ENCOUNTER — Telehealth: Payer: Self-pay | Admitting: Neurology

## 2019-07-01 ENCOUNTER — Encounter: Payer: Self-pay | Admitting: Neurology

## 2019-07-01 ENCOUNTER — Ambulatory Visit (INDEPENDENT_AMBULATORY_CARE_PROVIDER_SITE_OTHER): Payer: Medicare Other | Admitting: Neurology

## 2019-07-01 VITALS — BP 122/80 | HR 61 | Temp 97.8°F | Ht 66.0 in | Wt 178.0 lb

## 2019-07-01 DIAGNOSIS — M7918 Myalgia, other site: Secondary | ICD-10-CM

## 2019-07-01 DIAGNOSIS — R519 Headache, unspecified: Secondary | ICD-10-CM

## 2019-07-01 DIAGNOSIS — G4484 Primary exertional headache: Secondary | ICD-10-CM

## 2019-07-01 DIAGNOSIS — G43711 Chronic migraine without aura, intractable, with status migrainosus: Secondary | ICD-10-CM

## 2019-07-01 DIAGNOSIS — G8929 Other chronic pain: Secondary | ICD-10-CM

## 2019-07-01 MED ORDER — METOCLOPRAMIDE HCL 10 MG PO TABS: 10.0000 mg | ORAL_TABLET | Freq: Three times a day (TID) | ORAL | 12 refills | Status: DC | PRN

## 2019-07-01 MED ORDER — ZONISAMIDE 100 MG PO CAPS: 100.0000 mg | ORAL_CAPSULE | Freq: Every day | ORAL | 4 refills | Status: DC

## 2019-07-01 MED ORDER — PROPRANOLOL HCL ER 80 MG PO CP24: 80.0000 mg | ORAL_CAPSULE | Freq: Every day | ORAL | 4 refills | Status: DC

## 2019-07-01 MED ORDER — SUMATRIPTAN SUCCINATE 100 MG PO TABS: 100.0000 mg | ORAL_TABLET | Freq: Once | ORAL | 12 refills | Status: DC | PRN

## 2019-07-01 NOTE — Telephone Encounter (Signed)
Botox charge sheet completed and signed by Dr. Jaynee Eagles. G43.711. Will need pt consent at first injection appointment.

## 2019-07-01 NOTE — Telephone Encounter (Signed)
Sheet given to botox coordinator.

## 2019-07-01 NOTE — Patient Instructions (Addendum)
Start Botox injections (we will call you) MRI of the brain and MRA of the head One lab today Dry Needling of neck muscles Follow up with Dr. Jacelyn Grip

## 2019-07-01 NOTE — Telephone Encounter (Signed)
Medicare order sent to GI. No auth they will reach out to the patient to schedule.  

## 2019-07-01 NOTE — Progress Notes (Signed)
[facility]    Provider:  Dr Jaynee Eagles Referring Provider: Willey Blade, MD Primary Care Physician:  Willey Blade, MD   CC:  Migraine   Interval history 07/01/19: 64 year old with a past medical history sleep apnea, rheumatoid arthritis, osteoarthritis, chronic migraines for over 2 decades (when I initially saw her quality, frequency, severity and triggers had not significantly changed in many years), hypothyroidism, depression, chronic low back pain. this is a patient I been seeing for several years, chronic migraines and exertional headaches, she has been doing extremely well on propranolol and zonisamide, her blood pressure has been a little elevated, I last visit we increase propranolol to help with headaches as well as blood pressure.  She is also on zonisamide for her migraines.  Reglan and Imitrex as tolerated for acute management.  Today she is not doing as well. She reports her neck is worsening, she can walk her dog around the block and she has a migraine, any movement with her upper body can cause a migraine, she had a nerve ablation in the low back last year and she is going to see Dr. Jacelyn Grip.  She is having daily migraines and headaches, migraines can be moderately severe to severe, they can last upwards of 24 hours, no aura, no medication overuse, ongoing for the last year at the severity and frequency, worsening exertional quality, she can get a headache even folding close.  This is concerning we need imaging.Will start botox.  Tried: Topamax, amitriptyline, gabapentin, Flexeril, propranolol, Pristiq, Reglan, tylenol, nsaids, asa, and she has tried a lot that she can't remember and was on these for years, stopped working.Taking Zonegran now.  Interval history 10/11/2018: Patient continues to do well.  She still has exertional headaches when she walks and her blood pressure runs "a little on the high side".  We discussed increasing the propranolol to 80 mg as this  is a very good headache/migraine medication, and can help with her slightly elevated high blood pressure.  We also discussed she takes the benzodiazepine very rarely, before procedures, sometimes for anxiety, advised her to continue to take it very sparingly and situationally, discussed addiction and overuse and risks of respiratory depression.  Also discussed side effects of propranolol.  We will continue her zonisamide for her migraines.  We will continue Reglan and Imitrex as tolerated for acute management. The headaches start in the back of her neck, it gets tight then spreads to the left side of her head. It is poinding, throbbing, pain behind the eye temple pounding, +light sensitivity, +sound sensitivity, + nausea, no vomiting in over 10 years. Another trigger is exercise. When she exercise she will get a migraine that day.  10/04/2017: She is doing exceptionally well. She tolerates the medication. She is very thankful. Discussed her current regimen, the new medications anti-CGRP meds should she every need them, also botox for migraine and other preventative and acute management but she very happy as is.  Discussed that she now gets headaches and moigraines after her ESI or LBP, unclear if it is the anxiety of the shot, discussed options will give xanax prn   10/04/2016: Patient is here for chronic migraines. She is improved with regard to the migraines. She still has exertional headaches. Her blood pressure is slightly elevated. Discussed increasing propranolol for exertional headaches and may also help with hypertension she should be monitoring her blood pressure daily. Discussed exertional headaches. Discussed hypertension and following up with her primary care for management. She is doing well  on Zonisamide. We'll continue 100 mg in the evenings. Discussed headache triggers, environmental triggers, stress triggers and lifestyle.  HPI:  Terry Shannon is a 64 y.o. female here as a second opinion from  my colleague Dr. Brett Fairy  for migraines. She has had the migraines since the age of 66. Certain foods and drinks are triggers and she has taken those out of her diet, like wine and aspertame. The headaches start in the back of her neck, it gets tight then spreads to the left side of her head. It is poinding, throbbing, pain behind the eye temple pounding, +light sensitivity, +sound sensitivity, + nausea, no vomiting in over 10 years. Another trigger is exercise. When she exercise she will get a migraine that day. No medicaton overuse headache. Has been going on for years at this frequency. She has 16 migraines a month, they can last for 1-3 days, on average they are 8/10 in severity. They started years ago after a myelogram. No FHx of migraines. She was recently started on Zonegran by Dr. Roddie Mc which she feels may be helping. Reports that she has had normal brain imaging in the past.  Tried: Topamax, amitriptyline, propranolol and she has tried a lot that she can't remember and was on these for years, stopped working.Taking Zonegran now. She uses flexeril and an aspirin now on onset which seems to help a little. Imitrex used to help but stopped helping. She tried zomig and mazalt and relpax.  Has contraindication to NSAIDs but still takes aspirin as this is the only thing that appears to help acutely.   Reviewed notes, labs and imaging from outside physicians, which showed: Personally reviewed images and agree with the following  Vertebral Arteries: BILATERAL patent, LEFT dominant.  Paraspinal tissues: Paravertebral anterior soft tissues are obscured by saturation bands. No visible adenopathy.  Disc levels:  The individual disc spaces were examined as follows:  C2-3: Normal disc space. Trace anterolisthesis. Severe left-sided facet arthropathy. No foraminal narrowing.  C3-4: Moderately severe disc space narrowing. Fusion across the facet joints. No impingement.  C4-5: Central disc  osteophyte complex. Trace anterolisthesis. No impingement.  C5-6: Moderate disc space narrowing. Asymmetric foraminal narrowing on the RIGHT due to uncinate spurring. RIGHT C6 nerve root impingement not excluded.  C6-7: Central disc osteophyte complex. No stenosis or foraminal narrowing.  C7-T1: 3 mm anterolisthesis is facet mediated. Advanced facet arthropathy without impingement.  IMPRESSION: Multilevel spondylosis as described. No significant left-sided foraminal narrowing or leftward disc protrusion.  Asymmetric facet arthropathy on the LEFT is most notable at C2-3. Correlate clinically for facet mediated upper extremity symptoms.  Review of Systems: Patient complains of symptoms per HPI as well as the following symptoms: Hearing loss, ear pain, runny nose, environmental allergies, joint pain, joint swelling, back pain, aching muscles, walking difficulty, neck pain, neck stiffness, headache. Pertinent negatives per HPI. All others negative.    Social History   Socioeconomic History  . Marital status: Significant Other    Spouse name: Not on file  . Number of children: Not on file  . Years of education: Not on file  . Highest education level: Bachelor's degree (e.g., BA, AB, BS)  Occupational History  . Not on file  Tobacco Use  . Smoking status: Never Smoker  . Smokeless tobacco: Never Used  Substance and Sexual Activity  . Alcohol use: Yes    Comment: 2-3 drinks/week  . Drug use: No  . Sexual activity: Not Currently    Birth control/protection: None  Other  Topics Concern  . Not on file  Social History Narrative   Lives at home with her partner and two daughters   Right handed   Caffeine; none   Social Determinants of Health   Financial Resource Strain:   . Difficulty of Paying Living Expenses: Not on file  Food Insecurity:   . Worried About Charity fundraiser in the Last Year: Not on file  . Ran Out of Food in the Last Year: Not on file  Transportation  Needs:   . Lack of Transportation (Medical): Not on file  . Lack of Transportation (Non-Medical): Not on file  Physical Activity:   . Days of Exercise per Week: Not on file  . Minutes of Exercise per Session: Not on file  Stress:   . Feeling of Stress : Not on file  Social Connections:   . Frequency of Communication with Friends and Family: Not on file  . Frequency of Social Gatherings with Friends and Family: Not on file  . Attends Religious Services: Not on file  . Active Member of Clubs or Organizations: Not on file  . Attends Archivist Meetings: Not on file  . Marital Status: Not on file  Intimate Partner Violence:   . Fear of Current or Ex-Partner: Not on file  . Emotionally Abused: Not on file  . Physically Abused: Not on file  . Sexually Abused: Not on file    Family History  Problem Relation Age of Onset  . Stroke Mother   . Cancer Father 66       lung  . Diabetes Brother   . Barrett's esophagus Brother   . Cancer Maternal Grandmother 20       colon  . Stroke Maternal Grandmother   . Stroke Maternal Grandfather   . Diabetes Paternal Grandmother     Past Medical History:  Diagnosis Date  . Allergy    year-round, pt. states  . Chronic lower back pain   . 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 Complication of anesthesia    slow to wake up after gallbladder surgery  . Depression   . Eczema    ARMS AND HANDS  . GERD (gastroesophageal reflux disease)   . History of thyroid cancer   . Hypothyroidism   . Migraines   . Osteoarthritis   . Overactive bladder   . RA (rheumatoid arthritis) ([place])   . Sleep apnea    no CPAP use  . Ulcerative colitis [facility])     Past Surgical History:  Procedure Laterality Date  . ABDOMINAL HERNIA REPAIR  123456   periumbilical ventral hernia/incisional hernia  . BUNIONECTOMY Right    x 2 more  . BUNIONECTOMY Left   . BUNIONECTOMY WITH CHILECTOMY Right 12/16/2005  . CARPAL TUNNEL RELEASE Right  07/13/2001  . CARPAL TUNNEL RELEASE Left 08/10/2001  . DILATION AND CURETTAGE OF UTERUS    . HAMMER TOE SURGERY Left may 2014  . HYSTEROSCOPY WITH D & C  03/01/2004   with exc. of endometrial polyp  . KNEE ARTHROSCOPY Right 2013  . LAPAROSCOPIC CHOLECYSTECTOMY  11/03/2006  . LUMBAR FUSION  03/06/2014   l4  l5   . nerve ablation lumbar     . THYROIDECTOMY, PARTIAL Right prior to 2002  . THYROIDECTOMY, PARTIAL Left 11/29/2000   and isthmus  . TONSILLECTOMY  1961  . TOTAL ANKLE REPLACEMENT Left 11/2016   with tendon repair  . TOTAL KNEE ARTHROPLASTY Right 04/01/2013  Procedure: RIGHT TOTAL KNEE ARTHROPLASTY;  Surgeon: Alta Corning, MD;  Location: [facility];  Service: Orthopedics;  Laterality: Right;    Current Outpatient Medications  Medication Sig Dispense Refill  . Acetylcysteine (NAC PO) Take 1 tablet by mouth daily.    Marland Kitchen ALPRAZolam (XANAX) 0.5 MG tablet Take 1 tablet (0.5 mg total) by mouth 3 (three) times daily as needed for anxiety. 30 tablet 2  . Ascorbic Acid (VITAMIN C PO) Take 6,000 mg by mouth daily.    . B Complex-C (SUPER B COMPLEX PO) Take 1 tablet by mouth daily.    . Cholecalciferol (VITAMIN D3 PO) Take 1 tablet by mouth daily.     . Coenzyme Q10 (COQ-10 PO) Take 10 mLs by mouth daily.    . cyclobenzaprine (FLEXERIL) 10 MG tablet Take 10 mg by mouth 3 (three) times daily as needed for muscle spasms.    Marland Kitchen desvenlafaxine (PRISTIQ) 100 MG 24 hr tablet Take 100 mg by mouth daily.     Marland Kitchen Fexofenadine HCl (ALLEGRA PO) Take by mouth.    . Flax OIL Take by mouth.    . gabapentin (NEURONTIN) 300 MG capsule Take 300 mg by mouth 3 (three) times daily.    [place] Reek 6.5 MG INST Place 1 tablet vaginally at bedtime.    Marland Kitchen levothyroxine (SYNTHROID) 50 MCG tablet Take 50 mcg by mouth daily before breakfast.    . mesalamine (LIALDA) 1.2 G EC tablet Take 2.4 g by mouth 2 (two) times daily.     . metoCLOPramide (REGLAN) 10 MG tablet Take 1 tablet (10 mg total) by mouth every 8 (eight) hours  as needed. Take to treat migraines and/or nausea. 20 tablet 12  . Omega-3 Fatty Acids (OMEGA 3 PO) Take 1,200 mg by mouth daily.     Marland Kitchen omeprazole (PRILOSEC) 40 MG capsule Take 40 mg by mouth daily.    Marland Kitchen OVER THE COUNTER MEDICATION Organic elderberry    . oxybutynin (DITROPAN-XL) 10 MG 24 hr tablet Take 10 mg by mouth daily.    . Probiotic Product (PROBIOTIC DAILY PO) Take 1 tablet by mouth daily.     . propranolol ER (INDERAL LA) 80 MG 24 hr capsule Take 1 capsule (80 mg total) by mouth at bedtime. For migraine prevention and high blood pressure. 90 capsule 4  . RESTASIS MULTIDOSE 0.05 % ophthalmic emulsion     . Rosuvastatin Calcium (CRESTOR PO) Take 20 mg by mouth daily.     . SUMAtriptan (IMITREX) 100 MG tablet Take 1 tablet (100 mg total) by mouth once as needed for up to 1 dose for migraine. May repeat in 2 hours if headache persists or recurs. 10 tablet 12  . thyroid (ARMOUR) 60 MG tablet Take 60 mg by mouth daily before breakfast.    . zolpidem (AMBIEN CR) 12.5 MG CR tablet Take 12.5 mg by mouth at bedtime.    Marland Kitchen zonisamide (ZONEGRAN) 100 MG capsule Take 1 capsule (100 mg total) by mouth daily. For migraine prevention. 90 capsule 4  . aspirin EC 325 MG tablet Take 325 mg by mouth 2 (two) times daily as needed for mild pain.     Marland Kitchen etanercept (ENBREL) 50 MG/ML injection Inject 50 mg into the skin once a week.     No current facility-administered medications for this visit.    Allergies as of 07/01/2019 - Review Complete 07/01/2019  Allergen Reaction Noted  . Codeine Nausea And Vomiting 09/09/2010  . Hydrocodone Nausea And Vomiting 08/08/2012  .  Ibuprofen Other (See Comments) 08/08/2012  . Iodinated diagnostic agents Hives and Itching 09/16/2010  . Methotrexate derivatives Other (See Comments) 08/08/2012  . Oxycodone Nausea And Vomiting 04/01/2013  . Tylenol [acetaminophen] Other (See Comments) 08/08/2012  . Aleve [naproxen sodium]    . Sulfa antibiotics  11/01/2011  . Neosporin  [neomycin-bacitracin zn-polymyx] Swelling 07/18/2013    Vitals: BP 122/80 (BP Location: Left Arm, Patient Position: Sitting)   Pulse 61   Temp 97.8 F (36.6 C) (Oral)   Ht 5\' 6"  (1.676 m)   Wt 178 lb (80.7 kg)   BMI 28.73 kg/m  Last Weight:  Wt Readings from Last 1 Encounters:  07/01/19 178 lb (80.7 kg)   Last Height:   Ht Readings from Last 1 Encounters:  07/01/19 5\' 6"  (1.676 m)   PRIOR EXAM  Physical exam: Exam: Gen: NAD, conversant, well nourised, obese, well groomed                     Eyes: Conjunctivae clear without exudates or hemorrhage  Neuro: Detailed Neurologic Exam  Speech:    Speech is normal; fluent and spontaneous with normal comprehension.  Cognition:    The patient is oriented to person, place, and time;     Cranial Nerves: Extraocular movements are intact. Trigeminal sensation is intact and the muscles of mastication are normal. The face is symmetric. The palate elevates in the midline. Hearing intact. Voice is normal. Shoulder shrug is normal. The tongue has normal motion without fasciculations.   Gait:     antalgic with a cane  Motor Observation:    No asymmetry, no atrophy, and no involuntary movements noted. Tone:    Normal muscle tone.    Posture:    Posture is normal. normal erect    Strength:    Strength is V/V in the upper and lower limbs.        Assessment/Plan:  64 year old female with chronic migraines, exertional headaches and hypertension. She hsa been doing well however now worsening. I think if she is worsening she needs MRI of the brain and MRA of the head at this time.   -Patient had worsening of headaches, exertional, blood pressure is managed, no medication overuse, I do feel at this time given her exertional headaches she needs an MRI of the brain and an MRA of the head to rule out aneurysm, space-occupying mass, other lesions(sch as metastasis - pmhx of cancer), that can be causing her exertional, worsening  headaches. - For her exercise-induced headaches as well as migraine prevention: Continue propranolol 80mg  qhs.  Continue zonisamide.  She is tried multiple medications at this point we will initiate Botox which may also help with her chronic musculoskeletal neck pain -We will send her to dry needling for her cervical  myofascial/musculoskeletal neck pain.  She is also to return to Dr. Ronalee Red for evaluation of arthritis in the neck and possible interventional procedures such as epidural steroid injections or other. -She is not using her CPAP, she is not having morning headaches or excessive fatigue, I did encourage her to use her CPAP discussed sequelae including increased stroke and other severe sequelae of untreated sleep apnea, she declines follow-up with our sleep team at this time. - On onset of headache, imitrex and reglan. I discussed Reglan side effects including tardive dyskinesias and other movement disorders.  Orders Placed This Encounter  Procedures  . MR BRAIN W WO CONTRAST  . MR ANGIO HEAD WO CONTRAST  . SAR CoV2 Serology (  COVID 19)AB(IGG)IA  . Basic Metabolic Panel  . Ambulatory referral to Physical Therapy   Meds ordered this encounter  Medications  . propranolol ER (INDERAL LA) 80 MG 24 hr capsule    Sig: Take 1 capsule (80 mg total) by mouth at bedtime. For migraine prevention and high blood pressure.    Dispense:  90 capsule    Refill:  4  . SUMAtriptan (IMITREX) 100 MG tablet    Sig: Take 1 tablet (100 mg total) by mouth once as needed for up to 1 dose for migraine. May repeat in 2 hours if headache persists or recurs.    Dispense:  10 tablet    Refill:  12  . zonisamide (ZONEGRAN) 100 MG capsule    Sig: Take 1 capsule (100 mg total) by mouth daily. For migraine prevention.    Dispense:  90 capsule    Refill:  4  . metoCLOPramide (REGLAN) 10 MG tablet    Sig: Take 1 tablet (10 mg total) by mouth every 8 (eight) hours as needed. Take to treat migraines and/or nausea.     Dispense:  20 tablet    Refill:  12     Discussed again: To prevent or relieve headaches, try the following: Cool Compress. Lie down and place a cool compress on your head.  Avoid headache triggers. If certain foods or odors seem to have triggered your migraines in the past, avoid them. A headache diary might help you identify triggers.  Include physical activity in your daily routine. Try a daily walk or other moderate aerobic exercise.  Manage stress. Find healthy ways to cope with the stressors, such as delegating tasks on your to-do list.  Practice relaxation techniques. Try deep breathing, yoga, massage and visualization.  Eat regularly. Eating regularly scheduled meals and maintaining a healthy diet might help prevent headaches. Also, drink plenty of fluids.  Follow a regular sleep schedule. Sleep deprivation might contribute to headaches Consider biofeedback. With this mind-body technique, you learn to control certain bodily functions -- such as muscle tension, heart rate and blood pressure -- to prevent headaches or reduce headache pain.    Proceed to emergency room if you experience new or worsening symptoms or symptoms do not resolve, if you have new neurologic symptoms or if headache is severe, or for any concerning symptom.   Provided education and documentation from American headache Society toolbox including articles on: chronic migraine medication overuse headache, chronic migraines, prevention of migraines, behavioral and other nonpharmacologic treatments for headache.    Sarina Ill, MD  [facility] [address] [place], [place] [address]  Phone [phone] Fax [phone]

## 2019-07-01 NOTE — Telephone Encounter (Signed)
Please initiate Botox approval, call patient with information, she can have a Amy or me perform the procedure.

## 2019-07-02 LAB — SAR COV2 SEROLOGY (COVID19)AB(IGG),IA: DiaSorin SARS-CoV-2 Ab, IgG: POSITIVE — AB

## 2019-07-02 LAB — BASIC METABOLIC PANEL
BUN/Creatinine Ratio: 26 (ref 12–28)
BUN: 14 mg/dL (ref 8–27)
CO2: 22 mmol/L (ref 20–29)
Calcium: 9.5 mg/dL (ref 8.7–10.3)
Chloride: 106 mmol/L (ref 96–106)
Creatinine, Ser: 0.54 mg/dL — ABNORMAL LOW (ref 0.57–1.00)
GFR calc Af Amer: 115 mL/min/{1.73_m2} (ref >59)
GFR calc non Af Amer: 99 mL/min/{1.73_m2} (ref >59)
Glucose: 114 mg/dL — ABNORMAL HIGH (ref 65–99)
Potassium: 4.4 mmol/L (ref 3.5–5.2)
Sodium: 142 mmol/L (ref 134–144)

## 2019-07-03 ENCOUNTER — Telehealth: Payer: Self-pay | Admitting: *Deleted

## 2019-07-03 NOTE — Telephone Encounter (Signed)
Attempted to reach patient with lab results. Phone rang several times then stopped. No voice mail, no one spoke.

## 2019-07-04 NOTE — Telephone Encounter (Signed)
Spoke with pt and let her know she has positive antibodies to COVID per labs. She verbalized understanding and appreciation for the call.

## 2019-07-04 NOTE — Telephone Encounter (Signed)
I called and scheduled the patient with Dr. Jaynee Eagles due to availability, Amy was booked over a month out.

## 2019-07-09 ENCOUNTER — Encounter: Payer: Self-pay | Admitting: Physical Therapy

## 2019-07-09 ENCOUNTER — Other Ambulatory Visit: Payer: Self-pay

## 2019-07-09 ENCOUNTER — Ambulatory Visit: Payer: Medicare Other | Attending: Neurology | Admitting: Physical Therapy

## 2019-07-09 DIAGNOSIS — M542 Cervicalgia: Secondary | ICD-10-CM | POA: Diagnosis not present

## 2019-07-09 DIAGNOSIS — R293 Abnormal posture: Secondary | ICD-10-CM | POA: Insufficient documentation

## 2019-07-09 DIAGNOSIS — M62838 Other muscle spasm: Secondary | ICD-10-CM | POA: Insufficient documentation

## 2019-07-09 NOTE — Patient Instructions (Signed)

## 2019-07-09 NOTE — Therapy (Signed)
[facility] [address], [place], [place], [address] Phone: [phone]   Fax:  [phone]  Physical Therapy Evaluation  Patient Details  Name: Terry Shannon MRN: [id number] Date of Birth: 11-10-1955 Referring Provider (PT): Sarina Ill, MD    Encounter Date: 07/09/2019  PT End of Session - 07/09/19 1121    Visit Number  1    Date for PT Re-Evaluation  08/20/19    Authorization Type  Medicare    Authorization Time Period  07/09/19 to 08/20/19    PT Start Time  1015    PT Stop Time  1059    PT Time Calculation (min)  44 min    Activity Tolerance  Patient tolerated treatment well    Behavior During Therapy  [facility] for tasks assessed/performed       Past Medical History:  Diagnosis Date  . Allergy    year-round, pt. states  . Chronic lower back pain   . 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 Complication of anesthesia    slow to wake up after gallbladder surgery  . Depression   . Eczema    ARMS AND HANDS  . GERD (gastroesophageal reflux disease)   . History of thyroid cancer   . Hypothyroidism   . Migraines   . Osteoarthritis   . Overactive bladder   . RA (rheumatoid arthritis) ([place])   . Sleep apnea    no CPAP use  . Ulcerative colitis [facility])     Past Surgical History:  Procedure Laterality Date  . ABDOMINAL HERNIA REPAIR  123456   periumbilical ventral hernia/incisional hernia  . BUNIONECTOMY Right    x 2 more  . BUNIONECTOMY Left   . BUNIONECTOMY WITH CHILECTOMY Right 12/16/2005  . CARPAL TUNNEL RELEASE Right 07/13/2001  . CARPAL TUNNEL RELEASE Left 08/10/2001  . DILATION AND CURETTAGE OF UTERUS    . HAMMER TOE SURGERY Left may 2014  . HYSTEROSCOPY WITH D & C  03/01/2004   with exc. of endometrial polyp  . KNEE ARTHROSCOPY Right 2013  . LAPAROSCOPIC CHOLECYSTECTOMY  11/03/2006  . LUMBAR FUSION  03/06/2014   l4  l5   . nerve ablation lumbar     . THYROIDECTOMY, PARTIAL Right  prior to 2002  . THYROIDECTOMY, PARTIAL Left 11/29/2000   and isthmus  . TONSILLECTOMY  1961  . TOTAL ANKLE REPLACEMENT Left 11/2016   with tendon repair  . TOTAL KNEE ARTHROPLASTY Right 04/01/2013   Procedure: RIGHT TOTAL KNEE ARTHROPLASTY;  Surgeon: Alta Corning, MD;  Location: [place];  Service: Orthopedics;  Laterality: Right;    There were no vitals filed for this visit.   Subjective Assessment - 07/09/19 1022    Subjective  Pt has been having migraines for 40 years, but it was mostly under control with medication. This last year, she has been having more issues with the neck pain and has to wear a soft collar with activity to not have as many migraines (she has been using this on and off for the past 30 years). She will notice this with really any activity. She has had PT on the neck but this seemed to give her migraines after she left. She was unable to finish this due to sickness, etc. She is trying to workout some with her 3# dumbbells while wearing her soft collar.    Pertinent History  migraines, anxiety    Limitations  House hold activities    How long  can you sit comfortably?  mostly ok unless working on the computer or other activity    Currently in Pain?  Yes    Pain Score  2     Pain Location  Neck    Pain Orientation  Right;Left;Lower    Pain Descriptors / Indicators  Aching;Dull    Pain Type  Chronic pain    Pain Radiating Towards  wraps over the upper traps    Pain Onset  More than a month ago    Pain Frequency  Intermittent    Aggravating Factors   walking the dogs, computer work, really any kind of activity    Pain Relieving Factors  medication helps, soft collar, avoiding activity    Effect of Pain on Daily Activities  limited activity participation         [facility] PT Assessment - 07/09/19 0001      Assessment   Medical Diagnosis  chronic migraines without aura     Referring Provider (PT)  Sarina Ill, MD     Onset Date/Surgical Date  --   for the past year     Hand Dominance  Right    Next MD Visit  none for now     Prior Therapy  some PT but didn't finish last year for her neck       Precautions   Precautions  None      Balance Screen   Has the patient fallen in the past 6 months  No    Has the patient had a decrease in activity level because of a fear of falling?   No    Is the patient reluctant to leave their home because of a fear of falling?   No      Prior Function   Level of Independence  Independent    Leisure  walking her dog      Cognition   Overall Cognitive Status  Within Functional Limits for tasks assessed      ROM / Strength   AROM / PROM / Strength  AROM;Strength;PROM      AROM   AROM Assessment Site  Cervical    Cervical Flexion  20 deg     Cervical Extension  30 deg     Cervical - Right Rotation  35 deg     Cervical - Left Rotation  35 deg       PROM   Overall PROM Comments  WNL       Strength   Overall Strength Comments  BUE shoulder flexion/abduction 3+/5 MMT (likely due to understanding of instruction)       Palpation   Spinal mobility  hypomobile CPAs upper cervical spine     Palpation comment  tenderness with palpation of the upper traps, cervical paraspinals                Objective measurements completed on examination: See above findings.      [place] Adult PT Treatment/Exercise - 07/09/19 0001      Self-Care   Self-Care  Posture    Posture  avoiding forward head positions during computer time, etc. handout provided              PT Education - 07/09/19 1115    Education Details  info on desk ergonomics; dry needling info    Person(s) Educated  Patient    Methods  Explanation;Handout    Comprehension  Verbalized understanding;Returned demonstration       PT Short Term  Goals - 07/09/19 1133      PT SHORT TERM GOAL #1   Title  Pt will demo consistency and independence with her initial HEP to decrease pain and improve posture.    Time  3    Period  Weeks    Status   New    Target Date  07/30/19        PT Long Term Goals - 07/09/19 1133      PT LONG TERM GOAL #1   Title  Pt will have 4+/5 MMT strength of the UEs to decrease compensations in the neck with daily activity.    Time  6    Period  Weeks    Status  New    Target Date  08/20/19      PT LONG TERM GOAL #2   Title  Pt will have atleast 50 deg of active cervical rotation Lt and Rt which will help her check her blind spots while driving.    Time  6    Period  Weeks    Status  New      PT LONG TERM GOAL #3   Title  Pt will report atleast 50% improvement in her pain with daily activity to improve quality of life and activity participation.    Time  6    Period  Weeks    Status  New      PT LONG TERM GOAL #4   Title  Pt will have improved cervical strength and endurance, evident by her ability to maintain deep cervical flexion lift off in supine for greater than 10 sec without forward head posture.    Time  6    Period  Weeks    Status  New             Plan - 07/09/19 1123    Clinical Impression Statement  Pt is a 64 y.o F referred to OPPT with complaints of chronic migraines that had been controlled with medication. This past year, she noticed increased sensitivity and neck pain with migraines during general daily activity. Pt has passive cervical ROM that is within normal limits, but her active ROM is limited in all directions secondary to poor cervical muscle strength and control. In addition, she has palpable muscle tenderness of the upper traps and cervical paraspinals. Pt would benefit from skilled PT to address her limitations in cervical strength, endurance, ROM and provide education on proper body mechanics and ergonomics throughout her day in order to improve her quality of life and activity participation.    Personal Factors and Comorbidities  Age;Time since onset of injury/illness/exacerbation;Fitness;Comorbidity 2    Comorbidities  chronic migraines, RA     Examination-Activity Limitations  Other    Examination-Participation Restrictions  Yard Work;Cleaning;Laundry;Other    Stability/Clinical Decision Making  Stable/Uncomplicated    Clinical Decision Making  Low    Rehab Potential  Good    PT Frequency  2x / week    PT Duration  6 weeks    PT Treatment/Interventions  ADLs/Self Care Home Management;Aquatic Therapy;Cryotherapy;Electrical Stimulation;Moist Heat;Therapeutic activities;Therapeutic exercise;Patient/family education;Neuromuscular re-education;Manual techniques;Dry needling;Passive range of motion;Taping    PT Next Visit Plan  DN upper traps, multifidi, cs paraspinals; cervical strength progression; gradual scap/shoulder strength    PT Home Exercise Plan  [id number]    Consulted and Agree with Plan of Care  Patient       Patient will benefit from skilled therapeutic intervention in order to improve the following deficits and  impairments:  Improper body mechanics, Pain, Increased muscle spasms, Postural dysfunction, Hypomobility, Decreased strength, Decreased range of motion, Decreased endurance, Impaired flexibility  Visit Diagnosis: Cervicalgia  Abnormal posture  Other muscle spasm     Problem List Patient Active Problem List   Diagnosis Date Noted  . Chronic migraine without aura, with intractable migraine, so stated, with status migrainosus 09/26/2015  . Radiculopathy 03/06/2014  . Osteoarthritis of right knee 04/01/2013  . OA (osteoarthritis) 11/01/2011  . OAB (overactive bladder)   . Ulcerative colitis ([place])   . Cancer ([place])   . Carpal tunnel syndrome   . RA (rheumatoid arthritis) [facility])     11:44 AM,07/09/19 Sherol Dade PT, DPT [place] [facility] [address] [place], [place], [address] Phone: [phone]   Fax:  [phone]  Name: Terry Shannon MRN: [id number] Date of Birth:  1955-08-19

## 2019-07-17 ENCOUNTER — Ambulatory Visit: Payer: Medicare Other | Admitting: Physical Therapy

## 2019-07-17 ENCOUNTER — Other Ambulatory Visit: Payer: Self-pay

## 2019-07-17 ENCOUNTER — Encounter: Payer: Self-pay | Admitting: Physical Therapy

## 2019-07-17 DIAGNOSIS — R293 Abnormal posture: Secondary | ICD-10-CM | POA: Diagnosis not present

## 2019-07-17 DIAGNOSIS — M62838 Other muscle spasm: Secondary | ICD-10-CM | POA: Diagnosis not present

## 2019-07-17 DIAGNOSIS — M542 Cervicalgia: Secondary | ICD-10-CM

## 2019-07-17 NOTE — Therapy (Signed)
[facility] [address] [place], [place], [address] Phone: [phone]   Fax:  [phone]  Physical Therapy Treatment  Patient Details  Name: Terry Shannon MRN: [id number] Date of Birth: 10-25-1955 Referring Provider (PT): Sarina Ill, MD    Encounter Date: 07/17/2019  PT End of Session - 07/17/19 1229    Visit Number  2    Date for PT Re-Evaluation  08/20/19    Authorization Type  Medicare    Authorization Time Period  07/09/19 to 08/20/19    PT Start Time  1229    PT Stop Time  1308    PT Time Calculation (min)  39 min    Activity Tolerance  Patient tolerated treatment well    Behavior During Therapy  [facility] for tasks assessed/performed       Past Medical History:  Diagnosis Date  . Allergy    year-round, pt. states  . Chronic lower back pain   . 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 Complication of anesthesia    slow to wake up after gallbladder surgery  . Depression   . Eczema    ARMS AND HANDS  . GERD (gastroesophageal reflux disease)   . History of thyroid cancer   . Hypothyroidism   . Migraines   . Osteoarthritis   . Overactive bladder   . RA (rheumatoid arthritis) ([place])   . Sleep apnea    no CPAP use  . Ulcerative colitis [facility])     Past Surgical History:  Procedure Laterality Date  . ABDOMINAL HERNIA REPAIR  123456   periumbilical ventral hernia/incisional hernia  . BUNIONECTOMY Right    x 2 more  . BUNIONECTOMY Left   . BUNIONECTOMY WITH CHILECTOMY Right 12/16/2005  . CARPAL TUNNEL RELEASE Right 07/13/2001  . CARPAL TUNNEL RELEASE Left 08/10/2001  . DILATION AND CURETTAGE OF UTERUS    . HAMMER TOE SURGERY Left may 2014  . HYSTEROSCOPY WITH D & C  03/01/2004   with exc. of endometrial polyp  . KNEE ARTHROSCOPY Right 2013  . LAPAROSCOPIC CHOLECYSTECTOMY  11/03/2006  . LUMBAR FUSION  03/06/2014   l4  l5   . nerve ablation lumbar     . THYROIDECTOMY, PARTIAL Right  prior to 2002  . THYROIDECTOMY, PARTIAL Left 11/29/2000   and isthmus  . TONSILLECTOMY  1961  . TOTAL ANKLE REPLACEMENT Left 11/2016   with tendon repair  . TOTAL KNEE ARTHROPLASTY Right 04/01/2013   Procedure: RIGHT TOTAL KNEE ARTHROPLASTY;  Surgeon: Alta Corning, MD;  Location: [place];  Service: Orthopedics;  Laterality: Right;    There were no vitals filed for this visit.  Subjective Assessment - 07/17/19 1232    Subjective  No headache today. Pt is still trying to do her home workouts wearing the soft collar to help prevent a HA>.    Pertinent History  migraines, anxiety    Currently in Pain?  No/denies    Multiple Pain Sites  No                       [facility] Adult PT Treatment/Exercise - 07/17/19 0001      Neck Exercises: Seated   Cervical Isometrics  Flexion;Extension;Right lateral flexion;Left lateral flexion;5 secs;5 reps      Manual Therapy   Soft tissue mobilization  Cervical spine LT > RT               PT Short Term  Goals - 07/09/19 1133      PT SHORT TERM GOAL #1   Title  Pt will demo consistency and independence with her initial HEP to decrease pain and improve posture.    Time  3    Period  Weeks    Status  New    Target Date  07/30/19        PT Long Term Goals - 07/09/19 1133      PT LONG TERM GOAL #1   Title  Pt will have 4+/5 MMT strength of the UEs to decrease compensations in the neck with daily activity.    Time  6    Period  Weeks    Status  New    Target Date  08/20/19      PT LONG TERM GOAL #2   Title  Pt will have atleast 50 deg of active cervical rotation Lt and Rt which will help her check her blind spots while driving.    Time  6    Period  Weeks    Status  New      PT LONG TERM GOAL #3   Title  Pt will report atleast 50% improvement in her pain with daily activity to improve quality of life and activity participation.    Time  6    Period  Weeks    Status  New      PT LONG TERM GOAL #4   Title  Pt will  have improved cervical strength and endurance, evident by her ability to maintain deep cervical flexion lift off in supine for greater than 10 sec without forward head posture.    Time  6    Period  Weeks    Status  New            Plan - 07/17/19 1233    Clinical Impression Statement  Pt presents today thinking her sessions would be all dry needling. She is receiving PT somewhere else where she is "working on strengthening her neck." She is not opposed to [place] also working on that but she is most interested in the needling. Since pt was seeing PTA today we introduced isometrics for her cervical spine ( sh ereports not doing these at her other facility) then spent a rather lengthy session of soft tissue work to her cervical spine. LT side generally felt thicker than the RT. No reported tenderness but tightness along the occiput.    Personal Factors and Comorbidities  Age;Time since onset of injury/illness/exacerbation;Fitness;Comorbidity 2    Comorbidities  chronic migraines, RA    Examination-Activity Limitations  Other    Examination-Participation Restrictions  Yard Work;Cleaning;Laundry;Other    Stability/Clinical Decision Making  Stable/Uncomplicated    Rehab Potential  Good    PT Frequency  2x / week    PT Duration  6 weeks    PT Treatment/Interventions  ADLs/Self Care Home Management;Aquatic Therapy;Cryotherapy;Electrical Stimulation;Moist Heat;Therapeutic activities;Therapeutic exercise;Patient/family education;Neuromuscular re-education;Manual techniques;Dry needling;Passive range of motion;Taping    PT Next Visit Plan  See how pt responded to cervical isometrics, consider adding them to her HEP if she does not have any cervical stabilitation exs, then DN to area of complaint.    PT Home Exercise Plan  [id number]       Patient will benefit from skilled therapeutic intervention in order to improve the following deficits and impairments:  Improper body mechanics, Pain, Increased muscle  spasms, Postural dysfunction, Hypomobility, Decreased strength, Decreased range of motion, Decreased endurance, Impaired flexibility  Visit  Diagnosis: Cervicalgia  Abnormal posture  Other muscle spasm     Problem List Patient Active Problem List   Diagnosis Date Noted  . Chronic migraine without aura, with intractable migraine, so stated, with status migrainosus 09/26/2015  . Radiculopathy 03/06/2014  . Osteoarthritis of right knee 04/01/2013  . OA (osteoarthritis) 11/01/2011  . OAB (overactive bladder)   . Ulcerative colitis ([place])   . Cancer ([place])   . Carpal tunnel syndrome   . RA (rheumatoid arthritis) ([place])     Terry Shannon, PTA 07/17/2019, 1:12 PM  [facility] [address] [place], [place], [address] Phone: [phone]   Fax:  [phone]  Name: Terry Shannon MRN: [id number] Date of Birth: 04-01-1956

## 2019-07-23 ENCOUNTER — Ambulatory Visit (INDEPENDENT_AMBULATORY_CARE_PROVIDER_SITE_OTHER): Payer: Medicare Other | Admitting: Neurology

## 2019-07-23 ENCOUNTER — Encounter: Payer: Self-pay | Admitting: Neurology

## 2019-07-23 ENCOUNTER — Other Ambulatory Visit: Payer: Self-pay

## 2019-07-23 VITALS — Temp 97.6°F

## 2019-07-23 DIAGNOSIS — G43711 Chronic migraine without aura, intractable, with status migrainosus: Secondary | ICD-10-CM | POA: Diagnosis not present

## 2019-07-23 NOTE — Progress Notes (Signed)

## 2019-07-23 NOTE — Progress Notes (Signed)
Botox consent signed today  Botox- 200 units x 1 vial Lot: [id number] Expiration: 02/2022 NDC: [id number]  Bacteriostatic 0.9% Sodium Chloride- 3mL total Lot: [id number] Expiration: 08/22/2019 NDC: [id number]  Dx: [id number] B/B

## 2019-07-25 ENCOUNTER — Encounter: Payer: Self-pay | Admitting: Physical Therapy

## 2019-07-25 ENCOUNTER — Other Ambulatory Visit: Payer: Self-pay

## 2019-07-25 ENCOUNTER — Ambulatory Visit: Payer: Medicare Other | Attending: Neurology | Admitting: Physical Therapy

## 2019-07-25 DIAGNOSIS — M542 Cervicalgia: Secondary | ICD-10-CM | POA: Insufficient documentation

## 2019-07-25 DIAGNOSIS — M62838 Other muscle spasm: Secondary | ICD-10-CM

## 2019-07-25 DIAGNOSIS — R293 Abnormal posture: Secondary | ICD-10-CM | POA: Insufficient documentation

## 2019-07-25 NOTE — Therapy (Signed)
[facility] [address], [place], [place], [address] Phone: [phone]   Fax:  [phone]  Physical Therapy Treatment  Patient Details  Name: Terry Shannon MRN: [id number] Date of Birth: 1955/08/02 Referring Provider (PT): Sarina Ill, MD    Encounter Date: 07/25/2019  PT End of Session - 07/25/19 1231    Visit Number  3    Date for PT Re-Evaluation  08/20/19    Authorization Type  Medicare    Authorization Time Period  07/09/19 to 08/20/19    PT Start Time  1231    PT Stop Time  1313    PT Time Calculation (min)  42 min    Activity Tolerance  Patient tolerated treatment well    Behavior During Therapy  [facility] for tasks assessed/performed       Past Medical History:  Diagnosis Date  . Allergy    year-round, pt. states  . Chronic lower back pain   . 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 Complication of anesthesia    slow to wake up after gallbladder surgery  . Depression   . Eczema    ARMS AND HANDS  . GERD (gastroesophageal reflux disease)   . History of thyroid cancer   . Hypothyroidism   . Migraines   . Osteoarthritis   . Overactive bladder   . RA (rheumatoid arthritis) ([place])   . Sleep apnea    no CPAP use  . Ulcerative colitis [facility])     Past Surgical History:  Procedure Laterality Date  . ABDOMINAL HERNIA REPAIR  123456   periumbilical ventral hernia/incisional hernia  . BUNIONECTOMY Right    x 2 more  . BUNIONECTOMY Left   . BUNIONECTOMY WITH CHILECTOMY Right 12/16/2005  . CARPAL TUNNEL RELEASE Right 07/13/2001  . CARPAL TUNNEL RELEASE Left 08/10/2001  . DILATION AND CURETTAGE OF UTERUS    . HAMMER TOE SURGERY Left may 2014  . HYSTEROSCOPY WITH D & C  03/01/2004   with exc. of endometrial polyp  . KNEE ARTHROSCOPY Right 2013  . LAPAROSCOPIC CHOLECYSTECTOMY  11/03/2006  . LUMBAR FUSION  03/06/2014   l4  l5   . nerve ablation lumbar     . THYROIDECTOMY, PARTIAL Right  prior to 2002  . THYROIDECTOMY, PARTIAL Left 11/29/2000   and isthmus  . TONSILLECTOMY  1961  . TOTAL ANKLE REPLACEMENT Left 11/2016   with tendon repair  . TOTAL KNEE ARTHROPLASTY Right 04/01/2013   Procedure: RIGHT TOTAL KNEE ARTHROPLASTY;  Surgeon: Alta Corning, MD;  Location: [place];  Service: Orthopedics;  Laterality: Right;    There were no vitals filed for this visit.  Subjective Assessment - 07/25/19 1231    Subjective  Patient had botox earlier this week. No change noted yet. She has not done anything to aggravate her HA right now. Patient is not seeing another PT at this time. Patient compliant with neck isometrics.    Pertinent History  migraines, anxiety    Currently in Pain?  Yes    Pain Score  2     Pain Location  Neck    Pain Orientation  Right;Left    Pain Descriptors / Indicators  Aching                       [facility] Adult PT Treatment/Exercise - 07/25/19 0001      Self-Care   Self-Care  Other Self-Care Comments    Other Self-Care  Comments   DN education and aftercare      Neck Exercises: Seated   Neck Retraction  5 secs;10 reps    Neck Retraction Limitations  then 5 chin tucks with flexion x 5 sec hold for stretch    Cervical Rotation  Right;Left;5 reps    Lateral Flexion  Right;Left    Lateral Flexion Limitations  2 reps      Neck Exercises: Prone   Other Prone Exercise  Prone on elbows diagonals x 10 each way      Manual Therapy   Manual Therapy  Soft tissue mobilization    Manual therapy comments  Skilled palpation and monitoring of soft tissues during DN     Soft tissue mobilization  IASTM to bil UT cspine and suboccipitals      Neck Exercises: Stretches   Upper Trapezius Stretch  Right;Left;1 rep;30 seconds       Trigger Point Dry Needling - 07/25/19 0001    Consent Given?  Yes    Education Handout Provided  Yes    Muscles Treated Head and Neck  Upper trapezius;Cervical multifidi;Suboccipitals    Upper Trapezius Response  Twitch  reponse elicited;Palpable increased muscle length    Suboccipitals Response  Twitch response elicited;Palpable increased muscle length    Cervical multifidi Response  Palpable increased muscle length           PT Education - 07/25/19 1314    Education Details  Reviewed isometrics; HEP progressed and DN education and aftercare    Person(s) Educated  Patient    Methods  Explanation;Demonstration;Handout    Comprehension  Verbalized understanding;Returned demonstration       PT Short Term Goals - 07/09/19 1133      PT SHORT TERM GOAL #1   Title  Pt will demo consistency and independence with her initial HEP to decrease pain and improve posture.    Time  3    Period  Weeks    Status  New    Target Date  07/30/19        PT Long Term Goals - 07/09/19 1133      PT LONG TERM GOAL #1   Title  Pt will have 4+/5 MMT strength of the UEs to decrease compensations in the neck with daily activity.    Time  6    Period  Weeks    Status  New    Target Date  08/20/19      PT LONG TERM GOAL #2   Title  Pt will have atleast 50 deg of active cervical rotation Lt and Rt which will help her check her blind spots while driving.    Time  6    Period  Weeks    Status  New      PT LONG TERM GOAL #3   Title  Pt will report atleast 50% improvement in her pain with daily activity to improve quality of life and activity participation.    Time  6    Period  Weeks    Status  New      PT LONG TERM GOAL #4   Title  Pt will have improved cervical strength and endurance, evident by her ability to maintain deep cervical flexion lift off in supine for greater than 10 sec without forward head posture.    Time  6    Period  Weeks    Status  New            Plan -  07/25/19 1319    Clinical Impression Statement  Patient tolerated therex and DN well today. No complaints of HA before or after treatment. She reports compliance with isometrics at home. She is not seeing another PT at this time, but  has been to PT in the past and had increased HA after manual therapy. Significant tightness in suboccipitals today left > right. LTGs are ongoing.    Comorbidities  chronic migraines, RA    PT Treatment/Interventions  ADLs/Self Care Home Management;Aquatic Therapy;Cryotherapy;Electrical Stimulation;Moist Heat;Therapeutic activities;Therapeutic exercise;Patient/family education;Neuromuscular re-education;Manual techniques;Dry needling;Passive range of motion;Taping    PT Next Visit Plan  assess DN. progress cervical stabilization and postural exercises.    PT Home Exercise Plan  [id number]; New code [place] and Agree with Plan of Care  Patient       Patient will benefit from skilled therapeutic intervention in order to improve the following deficits and impairments:  Improper body mechanics, Pain, Increased muscle spasms, Postural dysfunction, Hypomobility, Decreased strength, Decreased range of motion, Decreased endurance, Impaired flexibility  Visit Diagnosis: Cervicalgia  Abnormal posture  Other muscle spasm     Problem List Patient Active Problem List   Diagnosis Date Noted  . Chronic migraine without aura, with intractable migraine, so stated, with status migrainosus 09/26/2015  . Radiculopathy 03/06/2014  . Osteoarthritis of right knee 04/01/2013  . OA (osteoarthritis) 11/01/2011  . OAB (overactive bladder)   . Ulcerative colitis ([place])   . Cancer ([place])   . Carpal tunnel syndrome   . RA (rheumatoid arthritis) ([place])    Madelyn Flavors PT 07/25/2019, 1:27 PM  [facility] [address], [place], [place], [address] Phone: [phone]   Fax:  [phone]  Name: Georgan Reise MRN: [id number] Date of Birth: 1956-01-12

## 2019-07-25 NOTE — Patient Instructions (Signed)
Access Code: [email or web address]  URL: [email or web address]  Date: 07/25/2019  Prepared by: Almyra Free Banks Chaikin   Exercises Seated Gentle Upper Trapezius Stretch - 3 reps - 1 sets - 30 sec hold - 2x daily - 7x weekly Seated Chin Tuck - 10 reps - 1 sets - 5 sec hold - 2x daily - 7x weekly Cervical Rotation Prone on Elbows - 10 reps - 1 sets - 2-3x daily - 7x weekly Patient Education Trigger Point Dry Needling

## 2019-07-27 ENCOUNTER — Ambulatory Visit
Admission: RE | Admit: 2019-07-27 | Discharge: 2019-07-27 | Disposition: A | Discharge status: Home / Self Care | Payer: Medicare Other | Source: Ambulatory Visit | Attending: Neurology | Admitting: Neurology

## 2019-07-27 ENCOUNTER — Other Ambulatory Visit: Payer: Self-pay

## 2019-07-27 DIAGNOSIS — R519 Headache, unspecified: Secondary | ICD-10-CM

## 2019-07-27 DIAGNOSIS — G8929 Other chronic pain: Secondary | ICD-10-CM

## 2019-07-27 DIAGNOSIS — G4484 Primary exertional headache: Secondary | ICD-10-CM

## 2019-07-27 DIAGNOSIS — G43711 Chronic migraine without aura, intractable, with status migrainosus: Secondary | ICD-10-CM

## 2019-07-27 MED ORDER — GADOBENATE DIMEGLUMINE 529 MG/ML IV SOLN
16.0000 mL | Freq: Once | INTRAVENOUS | Status: AC | PRN
  Administered 2019-07-27: 16 mL via INTRAVENOUS

## 2019-07-29 ENCOUNTER — Telehealth: Payer: Self-pay | Admitting: *Deleted

## 2019-07-29 NOTE — Telephone Encounter (Signed)
-----   Message from Melvenia Beam, MD sent at 07/29/2019  1:11 PM EST ----- MRi of the brain and MRA of the head unremarkable for age thanks

## 2019-07-29 NOTE — Telephone Encounter (Signed)
Called pt & LVM (ok per DPR) with results of MRI brain & MRA head being unremarkable for age, no concerns. Left office number and hours in message for call back.

## 2019-07-30 ENCOUNTER — Ambulatory Visit: Payer: Medicare Other

## 2019-07-30 ENCOUNTER — Other Ambulatory Visit: Payer: Self-pay

## 2019-07-30 DIAGNOSIS — M62838 Other muscle spasm: Secondary | ICD-10-CM

## 2019-07-30 DIAGNOSIS — R293 Abnormal posture: Secondary | ICD-10-CM | POA: Diagnosis not present

## 2019-07-30 DIAGNOSIS — M542 Cervicalgia: Secondary | ICD-10-CM

## 2019-07-30 NOTE — Therapy (Signed)
[facility] [address] [place], [place], [address] Phone: [phone]   Fax:  [phone]  Physical Therapy Treatment  Patient Details  Name: Terry Shannon MRN: [id number] Date of Birth: 1955/08/22 Referring Provider (PT): Sarina Ill, MD    Encounter Date: 07/30/2019  PT End of Session - 07/30/19 1316    Visit Number  4    Date for PT Re-Evaluation  08/20/19    PT Start Time  1232    PT Stop Time  1314    PT Time Calculation (min)  42 min    Activity Tolerance  Patient tolerated treatment well    Behavior During Therapy  [facility] for tasks assessed/performed       Past Medical History:  Diagnosis Date  . Allergy    year-round, pt. states  . Chronic lower back pain   . 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 Complication of anesthesia    slow to wake up after gallbladder surgery  . Depression   . Eczema    ARMS AND HANDS  . GERD (gastroesophageal reflux disease)   . History of thyroid cancer   . Hypothyroidism   . Migraines   . Osteoarthritis   . Overactive bladder   . RA (rheumatoid arthritis) ([place])   . Sleep apnea    no CPAP use  . Ulcerative colitis [facility])     Past Surgical History:  Procedure Laterality Date  . ABDOMINAL HERNIA REPAIR  123456   periumbilical ventral hernia/incisional hernia  . BUNIONECTOMY Right    x 2 more  . BUNIONECTOMY Left   . BUNIONECTOMY WITH CHILECTOMY Right 12/16/2005  . CARPAL TUNNEL RELEASE Right 07/13/2001  . CARPAL TUNNEL RELEASE Left 08/10/2001  . DILATION AND CURETTAGE OF UTERUS    . HAMMER TOE SURGERY Left may 2014  . HYSTEROSCOPY WITH D & C  03/01/2004   with exc. of endometrial polyp  . KNEE ARTHROSCOPY Right 2013  . LAPAROSCOPIC CHOLECYSTECTOMY  11/03/2006  . LUMBAR FUSION  03/06/2014   l4  l5   . nerve ablation lumbar     . THYROIDECTOMY, PARTIAL Right prior to 2002  . THYROIDECTOMY, PARTIAL Left 11/29/2000   and isthmus  .  TONSILLECTOMY  1961  . TOTAL ANKLE REPLACEMENT Left 11/2016   with tendon repair  . TOTAL KNEE ARTHROPLASTY Right 04/01/2013   Procedure: RIGHT TOTAL KNEE ARTHROPLASTY;  Surgeon: Alta Corning, MD;  Location: [place];  Service: Orthopedics;  Laterality: Right;    There were no vitals filed for this visit.  Subjective Assessment - 07/30/19 1231    Subjective  I am still struggling with migraines.  Since the needling, I have had a migraine and use the medicine and not get a full migraine. I have had a few migraines since then.    Patient Stated Goals  reduce migraines, manage neck pain    Currently in Pain?  Yes    Pain Orientation  Left;Right    Pain Descriptors / Indicators  Aching;Sore    Pain Type  Chronic pain    Pain Onset  More than a month ago    Pain Frequency  Intermittent    Aggravating Factors   any activity    Pain Relieving Factors  medication helpts, soft collar, avoiding activity                       [facility] Adult PT Treatment/Exercise - 07/30/19 0001  Manual Therapy   Manual Therapy  Soft tissue mobilization    Manual therapy comments  Skilled palpation and monitoring of soft tissues during DN.  Elongation and trigger point release to bil neck and upper traps       Trigger Point Dry Needling - 07/30/19 0001    Consent Given?  Yes    Education Handout Provided  Yes    Muscles Treated Head and Neck  Upper trapezius;Cervical multifidi;Suboccipitals    Upper Trapezius Response  Twitch reponse elicited;Palpable increased muscle length    Suboccipitals Response  Twitch response elicited;Palpable increased muscle length    Cervical multifidi Response  Palpable increased muscle length             PT Short Term Goals - 07/09/19 1133      PT SHORT TERM GOAL #1   Title  Pt will demo consistency and independence with her initial HEP to decrease pain and improve posture.    Time  3    Period  Weeks    Status  New    Target Date  07/30/19         PT Long Term Goals - 07/09/19 1133      PT LONG TERM GOAL #1   Title  Pt will have 4+/5 MMT strength of the UEs to decrease compensations in the neck with daily activity.    Time  6    Period  Weeks    Status  New    Target Date  08/20/19      PT LONG TERM GOAL #2   Title  Pt will have atleast 50 deg of active cervical rotation Lt and Rt which will help her check her blind spots while driving.    Time  6    Period  Weeks    Status  New      PT LONG TERM GOAL #3   Title  Pt will report atleast 50% improvement in her pain with daily activity to improve quality of life and activity participation.    Time  6    Period  Weeks    Status  New      PT LONG TERM GOAL #4   Title  Pt will have improved cervical strength and endurance, evident by her ability to maintain deep cervical flexion lift off in supine for greater than 10 sec without forward head posture.    Time  6    Period  Weeks    Status  New            Plan - 07/30/19 1316    Clinical Impression Statement  Pt reports some pain after last session.  No change in migraine symptoms.  Pt would like to continue to do dry needling to see if it will help with neck symptoms.  PT printed exercises from last  time as pt didn't know where her papers were.  Pt with tension in bil neck and upper traps and demonstrated improved tissue mobility after manual and dry needling today.  Pt will continue to benefit from skilled PT to address chronic headaches and neck pain.    PT Frequency  2x / week    PT Duration  6 weeks    PT Treatment/Interventions  ADLs/Self Care Home Management;Aquatic Therapy;Cryotherapy;Electrical Stimulation;Moist Heat;Therapeutic activities;Therapeutic exercise;Patient/family education;Neuromuscular re-education;Manual techniques;Dry needling;Passive range of motion;Taping    PT Next Visit Plan  assess DN. Review all HEP- add postural strength as able.    PT Home Exercise Plan  [id number]; New code [place] and Agree with Plan of Care  Patient       Patient will benefit from skilled therapeutic intervention in order to improve the following deficits and impairments:  Improper body mechanics, Pain, Increased muscle spasms, Postural dysfunction, Hypomobility, Decreased strength, Decreased range of motion, Decreased endurance, Impaired flexibility  Visit Diagnosis: Abnormal posture  Cervicalgia  Other muscle spasm     Problem List Patient Active Problem List   Diagnosis Date Noted  . Chronic migraine without aura, with intractable migraine, so stated, with status migrainosus 09/26/2015  . Radiculopathy 03/06/2014  . Osteoarthritis of right knee 04/01/2013  . OA (osteoarthritis) 11/01/2011  . OAB (overactive bladder)   . Ulcerative colitis ([place])   . Cancer ([place])   . Carpal tunnel syndrome   . RA (rheumatoid arthritis) ([place])      Sigurd Sos, PT 07/30/19 1:17 PM  [facility] [address] [place], [place], [address] Phone: [phone]   Fax:  [phone]  Name: Aleiyah Gruszka MRN: [id number] Date of Birth: 31-May-1955

## 2019-08-01 ENCOUNTER — Encounter: Payer: Self-pay | Admitting: Physical Therapy

## 2019-08-01 ENCOUNTER — Other Ambulatory Visit: Payer: Self-pay

## 2019-08-01 ENCOUNTER — Ambulatory Visit: Payer: Medicare Other | Admitting: Physical Therapy

## 2019-08-01 DIAGNOSIS — M542 Cervicalgia: Secondary | ICD-10-CM

## 2019-08-01 DIAGNOSIS — R293 Abnormal posture: Secondary | ICD-10-CM | POA: Diagnosis not present

## 2019-08-01 DIAGNOSIS — M62838 Other muscle spasm: Secondary | ICD-10-CM

## 2019-08-01 NOTE — Therapy (Signed)
[facility] [address], [place], [place], [address] Phone: [phone]   Fax:  [phone]  Physical Therapy Treatment  Patient Details  Name: Akeria Edley MRN: [id number] Date of Birth: Apr 12, 1956 Referring Provider (PT): Sarina Ill, MD    Encounter Date: 08/01/2019  PT End of Session - 08/01/19 1232    Visit Number  5    Date for PT Re-Evaluation  08/20/19    Authorization Type  Medicare    Authorization Time Period  07/09/19 to 08/20/19    PT Start Time  1232    PT Stop Time  1312    PT Time Calculation (min)  40 min    Activity Tolerance  Patient tolerated treatment well    Behavior During Therapy  [facility] for tasks assessed/performed       Past Medical History:  Diagnosis Date  . Allergy    year-round, pt. states  . Chronic lower back pain   . 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 Complication of anesthesia    slow to wake up after gallbladder surgery  . Depression   . Eczema    ARMS AND HANDS  . GERD (gastroesophageal reflux disease)   . History of thyroid cancer   . Hypothyroidism   . Migraines   . Osteoarthritis   . Overactive bladder   . RA (rheumatoid arthritis) ([place])   . Sleep apnea    no CPAP use  . Ulcerative colitis [facility])     Past Surgical History:  Procedure Laterality Date  . ABDOMINAL HERNIA REPAIR  123456   periumbilical ventral hernia/incisional hernia  . BUNIONECTOMY Right    x 2 more  . BUNIONECTOMY Left   . BUNIONECTOMY WITH CHILECTOMY Right 12/16/2005  . CARPAL TUNNEL RELEASE Right 07/13/2001  . CARPAL TUNNEL RELEASE Left 08/10/2001  . DILATION AND CURETTAGE OF UTERUS    . HAMMER TOE SURGERY Left may 2014  . HYSTEROSCOPY WITH D & C  03/01/2004   with exc. of endometrial polyp  . KNEE ARTHROSCOPY Right 2013  . LAPAROSCOPIC CHOLECYSTECTOMY  11/03/2006  . LUMBAR FUSION  03/06/2014   l4  l5   . nerve ablation lumbar     . THYROIDECTOMY, PARTIAL Right  prior to 2002  . THYROIDECTOMY, PARTIAL Left 11/29/2000   and isthmus  . TONSILLECTOMY  1961  . TOTAL ANKLE REPLACEMENT Left 11/2016   with tendon repair  . TOTAL KNEE ARTHROPLASTY Right 04/01/2013   Procedure: RIGHT TOTAL KNEE ARTHROPLASTY;  Surgeon: Alta Corning, MD;  Location: [place];  Service: Orthopedics;  Laterality: Right;    There were no vitals filed for this visit.  Subjective Assessment - 08/01/19 1232    Subjective  I am not noticing any change yet.    Patient Stated Goals  reduce migraines, manage neck pain    Currently in Pain?  Yes    Pain Score  3     Pain Location  Neck    Pain Orientation  Right;Left    Pain Descriptors / Indicators  Sore                       [facility] Adult PT Treatment/Exercise - 08/01/19 0001      Neck Exercises: Theraband   Shoulder Extension  20 reps;Blue    Rows  20 reps;Blue    Horizontal ABduction  20 reps;Blue      Neck Exercises: Seated   Neck Retraction  5 reps    Neck Retraction Limitations  with chin tuck x 5 sec      Neck Exercises: Supine   Neck Retraction  10 reps    Neck Retraction Limitations  into red ball    Cervical Rotation  Both;10 reps    Cervical Rotation Limitations  into red ball    Shoulder Flexion  Both;10 reps    Shoulder Flexion Limitations  head on red ball with retraction      Neck Exercises: Prone   Other Prone Exercise  Prone on elbows diagonals x 10 each way; then arm reaches x 5 ea      Neck Exercises: Stretches   Upper Trapezius Stretch  Right;Left;2 reps;30 seconds             PT Education - 08/01/19 1315    Education Details  Reviewed HEP and progressed    Person(s) Educated  Patient    Methods  Explanation;Demonstration;Handout    Comprehension  Verbalized understanding;Returned demonstration       PT Short Term Goals - 07/09/19 1133      PT SHORT TERM GOAL #1   Title  Pt will demo consistency and independence with her initial HEP to decrease pain and improve  posture.    Time  3    Period  Weeks    Status  New    Target Date  07/30/19        PT Long Term Goals - 07/09/19 1133      PT LONG TERM GOAL #1   Title  Pt will have 4+/5 MMT strength of the UEs to decrease compensations in the neck with daily activity.    Time  6    Period  Weeks    Status  New    Target Date  08/20/19      PT LONG TERM GOAL #2   Title  Pt will have atleast 50 deg of active cervical rotation Lt and Rt which will help her check her blind spots while driving.    Time  6    Period  Weeks    Status  New      PT LONG TERM GOAL #3   Title  Pt will report atleast 50% improvement in her pain with daily activity to improve quality of life and activity participation.    Time  6    Period  Weeks    Status  New      PT LONG TERM GOAL #4   Title  Pt will have improved cervical strength and endurance, evident by her ability to maintain deep cervical flexion lift off in supine for greater than 10 sec without forward head posture.    Time  6    Period  Weeks    Status  New            Plan - 08/01/19 1315    Clinical Impression Statement  Patient reported no change in migraine symptoms. She tolerated new TE with some fatigue but no pain reported.    PT Treatment/Interventions  ADLs/Self Care Home Management;Aquatic Therapy;Cryotherapy;Electrical Stimulation;Moist Heat;Therapeutic activities;Therapeutic exercise;Patient/family education;Neuromuscular re-education;Manual techniques;Dry needling;Passive range of motion;Taping    PT Next Visit Plan  review new exercises; continue DN 1-2 more times then d/c if no change    PT Home Exercise Plan  [email or web address]; New code [facility]       Patient will benefit from skilled therapeutic intervention in order to improve the following deficits and impairments:  Improper  body mechanics, Pain, Increased muscle spasms, Postural dysfunction, Hypomobility, Decreased strength, Decreased range of motion, Decreased endurance, Impaired  flexibility  Visit Diagnosis: Abnormal posture  Cervicalgia  Other muscle spasm     Problem List Patient Active Problem List   Diagnosis Date Noted  . Chronic migraine without aura, with intractable migraine, so stated, with status migrainosus 09/26/2015  . Radiculopathy 03/06/2014  . Osteoarthritis of right knee 04/01/2013  . OA (osteoarthritis) 11/01/2011  . OAB (overactive bladder)   . Ulcerative colitis ([place])   . Cancer ([place])   . Carpal tunnel syndrome   . RA (rheumatoid arthritis) ([place])     Madelyn Flavors PT  08/01/2019, 1:18 PM  [facility] [address], [place], [place], [address] Phone: [phone]   Fax:  [phone]  Name: Rola Hemsley MRN: [id number] Date of Birth: 1955/12/13

## 2019-08-06 ENCOUNTER — Ambulatory Visit: Payer: Medicare Other

## 2019-08-06 ENCOUNTER — Other Ambulatory Visit: Payer: Self-pay

## 2019-08-06 DIAGNOSIS — R293 Abnormal posture: Secondary | ICD-10-CM

## 2019-08-06 DIAGNOSIS — M542 Cervicalgia: Secondary | ICD-10-CM | POA: Diagnosis not present

## 2019-08-06 DIAGNOSIS — M62838 Other muscle spasm: Secondary | ICD-10-CM | POA: Diagnosis not present

## 2019-08-06 NOTE — Therapy (Signed)
[facility] [address], [place], [place], [address] Phone: [phone]   Fax:  [phone]  Physical Therapy Treatment  Patient Details  Name: Terry Shannon MRN: [id number] Date of Birth: 04/28/1956 Referring Provider (PT): Sarina Ill, MD    Encounter Date: 08/06/2019  PT End of Session - 08/06/19 1320    Visit Number  6    Date for PT Re-Evaluation  08/20/19    Authorization Type  Medicare    PT Start Time  1232   dry needling   PT Stop Time  1310    PT Time Calculation (min)  38 min    Activity Tolerance  Patient tolerated treatment well    Behavior During Therapy  [facility] for tasks assessed/performed       Past Medical History:  Diagnosis Date  . Allergy    year-round, pt. states  . Chronic lower back pain   . 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 Complication of anesthesia    slow to wake up after gallbladder surgery  . Depression   . Eczema    ARMS AND HANDS  . GERD (gastroesophageal reflux disease)   . History of thyroid cancer   . Hypothyroidism   . Migraines   . Osteoarthritis   . Overactive bladder   . RA (rheumatoid arthritis) ([place])   . Sleep apnea    no CPAP use  . Ulcerative colitis [facility])     Past Surgical History:  Procedure Laterality Date  . ABDOMINAL HERNIA REPAIR  123456   periumbilical ventral hernia/incisional hernia  . BUNIONECTOMY Right    x 2 more  . BUNIONECTOMY Left   . BUNIONECTOMY WITH CHILECTOMY Right 12/16/2005  . CARPAL TUNNEL RELEASE Right 07/13/2001  . CARPAL TUNNEL RELEASE Left 08/10/2001  . DILATION AND CURETTAGE OF UTERUS    . HAMMER TOE SURGERY Left may 2014  . HYSTEROSCOPY WITH D & C  03/01/2004   with exc. of endometrial polyp  . KNEE ARTHROSCOPY Right 2013  . LAPAROSCOPIC CHOLECYSTECTOMY  11/03/2006  . LUMBAR FUSION  03/06/2014   l4  l5   . nerve ablation lumbar     . THYROIDECTOMY, PARTIAL Right prior to 2002  . THYROIDECTOMY,  PARTIAL Left 11/29/2000   and isthmus  . TONSILLECTOMY  1961  . TOTAL ANKLE REPLACEMENT Left 11/2016   with tendon repair  . TOTAL KNEE ARTHROPLASTY Right 04/01/2013   Procedure: RIGHT TOTAL KNEE ARTHROPLASTY;  Surgeon: Alta Corning, MD;  Location: [place];  Service: Orthopedics;  Laterality: Right;    There were no vitals filed for this visit.  Subjective Assessment - 08/06/19 1234    Subjective  I have had headaches since the last visit so I haven't been doing my exercises.    Currently in Pain?  Yes    Pain Score  2     Pain Location  Neck    Pain Orientation  Right;Left    Pain Descriptors / Indicators  Sore    Pain Type  Chronic pain    Pain Onset  More than a month ago    Pain Frequency  Intermittent    Aggravating Factors   any activity, exercises    Pain Relieving Factors  medication helps, soft collar (sometimes)                       [facility] Adult PT Treatment/Exercise - 08/06/19 0001  Manual Therapy   Manual Therapy  Soft tissue mobilization    Manual therapy comments  Skilled palpation and monitoring of soft tissues during DN.  Elongation and trigger point release to bil neck and upper traps    Soft tissue mobilization  bil UT cspine and suboccipitals       Trigger Point Dry Needling - 08/06/19 0001    Consent Given?  Yes    Muscles Treated Head and Neck  Upper trapezius;Cervical multifidi;Suboccipitals    Upper Trapezius Response  Twitch reponse elicited;Palpable increased muscle length    Suboccipitals Response  Twitch response elicited;Palpable increased muscle length             PT Short Term Goals - 07/09/19 1133      PT SHORT TERM GOAL #1   Title  Pt will demo consistency and independence with her initial HEP to decrease pain and improve posture.    Time  3    Period  Weeks    Status  New    Target Date  07/30/19        PT Long Term Goals - 07/09/19 1133      PT LONG TERM GOAL #1   Title  Pt will have 4+/5 MMT strength  of the UEs to decrease compensations in the neck with daily activity.    Time  6    Period  Weeks    Status  New    Target Date  08/20/19      PT LONG TERM GOAL #2   Title  Pt will have atleast 50 deg of active cervical rotation Lt and Rt which will help her check her blind spots while driving.    Time  6    Period  Weeks    Status  New      PT LONG TERM GOAL #3   Title  Pt will report atleast 50% improvement in her pain with daily activity to improve quality of life and activity participation.    Time  6    Period  Weeks    Status  New      PT LONG TERM GOAL #4   Title  Pt will have improved cervical strength and endurance, evident by her ability to maintain deep cervical flexion lift off in supine for greater than 10 sec without forward head posture.    Time  6    Period  Weeks    Status  New            Plan - 08/06/19 1318    Clinical Impression Statement  Pt with headache reported since last session.  Pt has not performed her HEP due to this.  Pt will hold on these exercises for now.  Session focused on manual therapy and dry needling.  Pt with tension and trigger points in bil cervical paraspinals and suboccipitals.  Pt with improved tension after session.  Pt with chronic neck and headache condition and will continue to benefit from skilled PT to address this.    Rehab Potential  Good    PT Frequency  2x / week    PT Duration  6 weeks    PT Treatment/Interventions  ADLs/Self Care Home Management;Aquatic Therapy;Cryotherapy;Electrical Stimulation;Moist Heat;Therapeutic activities;Therapeutic exercise;Patient/family education;Neuromuscular re-education;Manual techniques;Dry needling;Passive range of motion;Taping    PT Next Visit Plan  assess response to to needling.  Supine theraband.    PT Home Exercise Plan  [email or web address]; New code [place] and Agree with Plan  of Care  Patient       Patient will benefit from skilled therapeutic intervention in order to  improve the following deficits and impairments:  Improper body mechanics, Pain, Increased muscle spasms, Postural dysfunction, Hypomobility, Decreased strength, Decreased range of motion, Decreased endurance, Impaired flexibility  Visit Diagnosis: Cervicalgia  Abnormal posture  Other muscle spasm     Problem List Patient Active Problem List   Diagnosis Date Noted  . Chronic migraine without aura, with intractable migraine, so stated, with status migrainosus 09/26/2015  . Radiculopathy 03/06/2014  . Osteoarthritis of right knee 04/01/2013  . OA (osteoarthritis) 11/01/2011  . OAB (overactive bladder)   . Ulcerative colitis ([place])   . Cancer ([place])   . Carpal tunnel syndrome   . RA (rheumatoid arthritis) ([place])     Sigurd Sos, PT 08/06/19 1:21 PM  [facility] [address], [place], [place], [address] Phone: [phone]   Fax:  [phone]  Name: Magdalyn Ipina MRN: [id number] Date of Birth: 1955-06-16

## 2019-08-08 ENCOUNTER — Ambulatory Visit: Payer: Medicare Other | Admitting: Physical Therapy

## 2019-08-08 ENCOUNTER — Other Ambulatory Visit: Payer: Self-pay

## 2019-08-08 ENCOUNTER — Encounter: Payer: Self-pay | Admitting: Physical Therapy

## 2019-08-08 DIAGNOSIS — M62838 Other muscle spasm: Secondary | ICD-10-CM | POA: Diagnosis not present

## 2019-08-08 DIAGNOSIS — M542 Cervicalgia: Secondary | ICD-10-CM

## 2019-08-08 DIAGNOSIS — R293 Abnormal posture: Secondary | ICD-10-CM | POA: Diagnosis not present

## 2019-08-08 NOTE — Patient Instructions (Signed)
Access Code: [email or web address] URL: [email or web address] Date: 08/08/2019 Prepared by: Almyra Free Aime Meloche  Exercises Seated Gentle Upper Trapezius Stretch - 2 x daily - 7 x weekly - 3 reps - 1 sets - 30 sec hold Seated Chin Tuck - 2 x daily - 7 x weekly - 10 reps - 1 sets - 5 sec hold Cervical Rotation Prone on Elbows - 2-3 x daily - 7 x weekly - 10 reps - 1 sets Standing Shoulder Horizontal Abduction with Resistance - 1 x daily - 3 x weekly - 10 reps - 1-33 sets Standing Shoulder Row with Anchored Resistance - 1 x daily - 3 x weekly - 10 reps - 1-3 sets Shoulder extension with resistance - Neutral - 1 x daily - 3 x weekly - 10 reps - 1-3 sets Standing Shoulder Diagonal Horizontal Abduction 60/120 Degrees with Resistance - 1 x daily - 7 x weekly - 3 sets - 10 reps Supine Shoulder External Rotation with Resistance - 1 x daily - 7 x weekly - 3 sets - 10 reps  Patient Education Trigger Point Dry Needling

## 2019-08-08 NOTE — Therapy (Signed)
[facility] [address] [place], [place], [address] Phone: [phone]   Fax:  [phone]  Physical Therapy Treatment  Patient Details  Name: Terry Shannon MRN: [id number] Date of Birth: 1955-10-12 Referring Provider (PT): Sarina Ill, MD    Encounter Date: 08/08/2019  PT End of Session - 08/08/19 1233    Visit Number  7    Date for PT Re-Evaluation  08/20/19    Authorization Type  Medicare    Authorization Time Period  07/09/19 to 08/20/19    PT Start Time  1233    PT Stop Time  1315    PT Time Calculation (min)  42 min    Activity Tolerance  Patient tolerated treatment well    Behavior During Therapy  [facility] for tasks assessed/performed       Past Medical History:  Diagnosis Date  . Allergy    year-round, pt. states  . Chronic lower back pain   . 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 Complication of anesthesia    slow to wake up after gallbladder surgery  . Depression   . Eczema    ARMS AND HANDS  . GERD (gastroesophageal reflux disease)   . History of thyroid cancer   . Hypothyroidism   . Migraines   . Osteoarthritis   . Overactive bladder   . RA (rheumatoid arthritis) ([place])   . Sleep apnea    no CPAP use  . Ulcerative colitis [facility])     Past Surgical History:  Procedure Laterality Date  . ABDOMINAL HERNIA REPAIR  123456   periumbilical ventral hernia/incisional hernia  . BUNIONECTOMY Right    x 2 more  . BUNIONECTOMY Left   . BUNIONECTOMY WITH CHILECTOMY Right 12/16/2005  . CARPAL TUNNEL RELEASE Right 07/13/2001  . CARPAL TUNNEL RELEASE Left 08/10/2001  . DILATION AND CURETTAGE OF UTERUS    . HAMMER TOE SURGERY Left may 2014  . HYSTEROSCOPY WITH D & C  03/01/2004   with exc. of endometrial polyp  . KNEE ARTHROSCOPY Right 2013  . LAPAROSCOPIC CHOLECYSTECTOMY  11/03/2006  . LUMBAR FUSION  03/06/2014   l4  l5   . nerve ablation lumbar     . THYROIDECTOMY, PARTIAL Right  prior to 2002  . THYROIDECTOMY, PARTIAL Left 11/29/2000   and isthmus  . TONSILLECTOMY  1961  . TOTAL ANKLE REPLACEMENT Left 11/2016   with tendon repair  . TOTAL KNEE ARTHROPLASTY Right 04/01/2013   Procedure: RIGHT TOTAL KNEE ARTHROPLASTY;  Surgeon: Alta Corning, MD;  Location: [facility] [facility];  Service: Orthopedics;  Laterality: Right;    There were no vitals filed for this visit.  Subjective Assessment - 08/08/19 1234    Subjective  Patient didn't do any exercises because they cause headaches. She had one day without a HA so didn't do anything because she didn't want to get one    Pertinent History  migraines, anxiety    Patient Stated Goals  reduce migraines, manage neck pain    Currently in Pain?  Yes    Pain Score  2     Pain Location  Neck    Pain Orientation  Right;Left    Pain Descriptors / Indicators  Sore                       [facility] Adult PT Treatment/Exercise - 08/08/19 0001      Self-Care   Self-Care  Other Self-Care Comments  Other Self-Care Comments   PNE education      Neck Exercises: Theraband   Shoulder Extension  10 reps;Red    Rows  10 reps;Green    Shoulder External Rotation  Red;10 reps    Shoulder External Rotation Limitations  attempted green; too difficult    Horizontal ABduction  20 reps;Green   supine   Other Theraband Exercises  diagonals green x 10 ea way      Neck Exercises: Supine   Upper Extremity Flexion with Stabilization  Flexion;10 reps    UE Flexion with Stabilization Limitations  bil; then 10 reps with opp leg march also             PT Education - 08/08/19 1318    Education Details  HEP progressed; long discussion about pain neuroscience. Patient advised to start a walking program daily with goals. Additionally, we discussed finding baselines for exercise tolerance and starting exercise here; also using imaging such as watching you tube videos of someone performing the activity she wants to do and then visualizing  herself doing the activity.    Person(s) Educated  Patient    Methods  Explanation;Demonstration;Handout    Comprehension  Verbalized understanding;Returned demonstration       PT Short Term Goals - 07/09/19 1133      PT SHORT TERM GOAL #1   Title  Pt will demo consistency and independence with her initial HEP to decrease pain and improve posture.    Time  3    Period  Weeks    Status  New    Target Date  07/30/19        PT Long Term Goals - 07/09/19 1133      PT LONG TERM GOAL #1   Title  Pt will have 4+/5 MMT strength of the UEs to decrease compensations in the neck with daily activity.    Time  6    Period  Weeks    Status  New    Target Date  08/20/19      PT LONG TERM GOAL #2   Title  Pt will have atleast 50 deg of active cervical rotation Lt and Rt which will help her check her blind spots while driving.    Time  6    Period  Weeks    Status  New      PT LONG TERM GOAL #3   Title  Pt will report atleast 50% improvement in her pain with daily activity to improve quality of life and activity participation.    Time  6    Period  Weeks    Status  New      PT LONG TERM GOAL #4   Title  Pt will have improved cervical strength and endurance, evident by her ability to maintain deep cervical flexion lift off in supine for greater than 10 sec without forward head posture.    Time  6    Period  Weeks    Status  New            Plan - 08/08/19 1326    Clinical Impression Statement  Patient presents today with contined complaints of intermittent HA and neck pain. She denies HA today. She reports HA after exercise and so does not do exercise on days she doesn't have HA for fear she'll get one. However, HA persist regardless. We discussed science of pain today and PT offered suggestions for decreasing association of exercise with pain. She tolerated upper  back strengthening well without complaint.    PT Treatment/Interventions  ADLs/Self Care Home Management;Aquatic  Therapy;Cryotherapy;Electrical Stimulation;Moist Heat;Therapeutic activities;Therapeutic exercise;Patient/family education;Neuromuscular re-education;Manual techniques;Dry needling;Passive range of motion;Taping    PT Next Visit Plan  assess theraband TE; did she try visualizing or start walking program?       Patient will benefit from skilled therapeutic intervention in order to improve the following deficits and impairments:  Improper body mechanics, Pain, Increased muscle spasms, Postural dysfunction, Hypomobility, Decreased strength, Decreased range of motion, Decreased endurance, Impaired flexibility  Visit Diagnosis: Cervicalgia  Abnormal posture  Other muscle spasm     Problem List Patient Active Problem List   Diagnosis Date Noted  . Chronic migraine without aura, with intractable migraine, so stated, with status migrainosus 09/26/2015  . Radiculopathy 03/06/2014  . Osteoarthritis of right knee 04/01/2013  . OA (osteoarthritis) 11/01/2011  . OAB (overactive bladder)   . Ulcerative colitis ([place])   . Cancer ([place])   . Carpal tunnel syndrome   . RA (rheumatoid arthritis) ([place])    Madelyn Flavors PT 08/08/2019, 1:30 PM  [facility] [address], [place], [place], [address] Phone: [phone]   Fax:  [phone]  Name: Odilia Ureste MRN: [id number] Date of Birth: 27-Aug-1955

## 2019-08-13 ENCOUNTER — Other Ambulatory Visit: Payer: Self-pay

## 2019-08-13 ENCOUNTER — Ambulatory Visit: Payer: Medicare Other | Admitting: Physical Therapy

## 2019-08-13 ENCOUNTER — Encounter: Payer: Self-pay | Admitting: Physical Therapy

## 2019-08-13 DIAGNOSIS — M542 Cervicalgia: Secondary | ICD-10-CM

## 2019-08-13 DIAGNOSIS — R293 Abnormal posture: Secondary | ICD-10-CM | POA: Diagnosis not present

## 2019-08-13 DIAGNOSIS — M62838 Other muscle spasm: Secondary | ICD-10-CM

## 2019-08-13 NOTE — Therapy (Signed)
[facility] [address], [place], [place], [address] Phone: [phone]   Fax:  [phone]  Physical Therapy Treatment  Patient Details  Name: Terry Shannon MRN: [id number] Date of Birth: 05/16/1956 Referring Provider (PT): Sarina Ill, MD    Encounter Date: 08/13/2019  PT End of Session - 08/13/19 1231    Visit Number  8    Date for PT Re-Evaluation  08/20/19    Authorization Time Period  07/09/19 to 08/20/19    PT Start Time  1232    PT Stop Time  1314    PT Time Calculation (min)  42 min    Activity Tolerance  Patient tolerated treatment well    Behavior During Therapy  [facility] for tasks assessed/performed       Past Medical History:  Diagnosis Date  . Allergy    year-round, pt. states  . Chronic lower back pain   . 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 Complication of anesthesia    slow to wake up after gallbladder surgery  . Depression   . Eczema    ARMS AND HANDS  . GERD (gastroesophageal reflux disease)   . History of thyroid cancer   . Hypothyroidism   . Migraines   . Osteoarthritis   . Overactive bladder   . RA (rheumatoid arthritis) ([place])   . Sleep apnea    no CPAP use  . Ulcerative colitis [facility])     Past Surgical History:  Procedure Laterality Date  . ABDOMINAL HERNIA REPAIR  123456   periumbilical ventral hernia/incisional hernia  . BUNIONECTOMY Right    x 2 more  . BUNIONECTOMY Left   . BUNIONECTOMY WITH CHILECTOMY Right 12/16/2005  . CARPAL TUNNEL RELEASE Right 07/13/2001  . CARPAL TUNNEL RELEASE Left 08/10/2001  . DILATION AND CURETTAGE OF UTERUS    . HAMMER TOE SURGERY Left may 2014  . HYSTEROSCOPY WITH D & C  03/01/2004   with exc. of endometrial polyp  . KNEE ARTHROSCOPY Right 2013  . LAPAROSCOPIC CHOLECYSTECTOMY  11/03/2006  . LUMBAR FUSION  03/06/2014   l4  l5   . nerve ablation lumbar     . THYROIDECTOMY, PARTIAL Right prior to 2002  . THYROIDECTOMY,  PARTIAL Left 11/29/2000   and isthmus  . TONSILLECTOMY  1961  . TOTAL ANKLE REPLACEMENT Left 11/2016   with tendon repair  . TOTAL KNEE ARTHROPLASTY Right 04/01/2013   Procedure: RIGHT TOTAL KNEE ARTHROPLASTY;  Surgeon: Alta Corning, MD;  Location: [facility];  Service: Orthopedics;  Laterality: Right;    There were no vitals filed for this visit.  Subjective Assessment - 08/13/19 1232    Subjective  Patient reports improvement day of last treatment. Didn't get HA until 7 pm that night. She did her exercises this weekend without causing HA.    Patient Stated Goals  reduce migraines, manage neck pain    Currently in Pain?  No/denies                       [facility] Adult PT Treatment/Exercise - 08/13/19 0001      Neck Exercises: Theraband   Shoulder Extension  20 reps;Red    Rows  20 reps;Red    Shoulder External Rotation  Red;10 reps    Horizontal ABduction  20 reps;Green   supine   Other Theraband Exercises  diagonals green x 10 ea way      Neck Exercises: Seated  Other Seated Exercise  diagonals x 10 each way      Manual Therapy   Manual Therapy  Soft tissue mobilization    Manual therapy comments  Skilled palpation and monitoring of soft tissues during DN.  Elongation and trigger point release to bil neck and upper traps    Soft tissue mobilization  bil UT cspine and suboccipitals      Neck Exercises: Stretches   Other Neck Stretches  cervical rotation stretch 2x30 sec ea       Trigger Point Dry Needling - 08/13/19 0001    Consent Given?  Yes    Education Handout Provided  Previously provided    Muscles Treated Head and Neck  Upper trapezius;Suboccipitals;Cervical multifidi    Upper Trapezius Response  Twitch reponse elicited;Palpable increased muscle length    Suboccipitals Response  Twitch response elicited;Palpable increased muscle length    Cervical multifidi Response  Twitch reponse elicited;Palpable increased muscle length             PT Short  Term Goals - 07/09/19 1133      PT SHORT TERM GOAL #1   Title  Pt will demo consistency and independence with her initial HEP to decrease pain and improve posture.    Time  3    Period  Weeks    Status  New    Target Date  07/30/19        PT Long Term Goals - 07/09/19 1133      PT LONG TERM GOAL #1   Title  Pt will have 4+/5 MMT strength of the UEs to decrease compensations in the neck with daily activity.    Time  6    Period  Weeks    Status  New    Target Date  08/20/19      PT LONG TERM GOAL #2   Title  Pt will have atleast 50 deg of active cervical rotation Lt and Rt which will help her check her blind spots while driving.    Time  6    Period  Weeks    Status  New      PT LONG TERM GOAL #3   Title  Pt will report atleast 50% improvement in her pain with daily activity to improve quality of life and activity participation.    Time  6    Period  Weeks    Status  New      PT LONG TERM GOAL #4   Title  Pt will have improved cervical strength and endurance, evident by her ability to maintain deep cervical flexion lift off in supine for greater than 10 sec without forward head posture.    Time  6    Period  Weeks    Status  New            Plan - 08/13/19 1315    Clinical Impression Statement  Patient making some improvements with HA since last visit. No neck pain reported today either. She was able to tolerate increased reps today. Cervical paraspinals with less tension today, however multifidi still with good response to DN.    PT Treatment/Interventions  ADLs/Self Care Home Management;Aquatic Therapy;Cryotherapy;Electrical Stimulation;Moist Heat;Therapeutic activities;Therapeutic exercise;Patient/family education;Neuromuscular re-education;Manual techniques;Dry needling;Passive range of motion;Taping    PT Next Visit Plan  did she try visualizing or start walking program? progress TE as tolerated    PT Home Exercise Plan  [email or web address]; New code [facility]        Patient will  benefit from skilled therapeutic intervention in order to improve the following deficits and impairments:  Improper body mechanics, Pain, Increased muscle spasms, Postural dysfunction, Hypomobility, Decreased strength, Decreased range of motion, Decreased endurance, Impaired flexibility  Visit Diagnosis: Cervicalgia  Abnormal posture  Other muscle spasm     Problem List Patient Active Problem List   Diagnosis Date Noted  . Chronic migraine without aura, with intractable migraine, so stated, with status migrainosus 09/26/2015  . Radiculopathy 03/06/2014  . Osteoarthritis of right knee 04/01/2013  . OA (osteoarthritis) 11/01/2011  . OAB (overactive bladder)   . Ulcerative colitis ([place])   . Cancer ([place])   . Carpal tunnel syndrome   . RA (rheumatoid arthritis) ([place])     Madelyn Flavors PT 08/13/2019, 1:19 PM  [facility] [address] [place], [place], [address] Phone: [phone]   Fax:  [phone]  Name: Terry Shannon MRN: [id number] Date of Birth: 13-Oct-1955

## 2019-08-15 ENCOUNTER — Encounter: Payer: Self-pay | Admitting: Physical Therapy

## 2019-08-15 ENCOUNTER — Ambulatory Visit: Payer: Medicare Other | Admitting: Physical Therapy

## 2019-08-15 ENCOUNTER — Other Ambulatory Visit: Payer: Self-pay

## 2019-08-15 DIAGNOSIS — M62838 Other muscle spasm: Secondary | ICD-10-CM

## 2019-08-15 DIAGNOSIS — M542 Cervicalgia: Secondary | ICD-10-CM

## 2019-08-15 DIAGNOSIS — R293 Abnormal posture: Secondary | ICD-10-CM

## 2019-08-15 NOTE — Patient Instructions (Signed)
Access Code: [email or web address] URL: [email or web address] Date: 08/15/2019 Prepared by: Almyra Free Cheron [place]  Exercises Seated Gentle Upper Trapezius Stretch - 2 x daily - 7 x weekly - 3 reps - 1 sets - 30 sec hold Seated Chin Tuck - 2 x daily - 7 x weekly - 10 reps - 1 sets - 5 sec hold Cervical Rotation Prone on Elbows - 2-3 x daily - 7 x weekly - 10 reps - 1 sets Standing Shoulder Horizontal Abduction with Resistance - 1 x daily - 3 x weekly - 10 reps - 1-33 sets Standing Shoulder Row with Anchored Resistance - 1 x daily - 3 x weekly - 10 reps - 1-3 sets Shoulder extension with resistance - Neutral - 1 x daily - 3 x weekly - 10 reps - 1-3 sets Standing Shoulder Diagonal Horizontal Abduction 60/120 Degrees with Resistance - 1 x daily - 7 x weekly - 3 sets - 10 reps Supine Shoulder External Rotation with Resistance - 1 x daily - 7 x weekly - 3 sets - 10 reps Supine Deep Neck Flexor Training - Repetitions - 1 x daily - 7 x weekly - 3 sets - 10 reps Standing Shoulder Flexion to 90 Degrees with Dumbbells - 1 x daily - 7 x weekly - 10 reps - 3 sets Scaption with Dumbbells - 1 x daily - 7 x weekly - 10 reps - 3 sets Shoulder Abduction with Dumbbells - Thumbs Up - 1 x daily - 7 x weekly - 10 reps - 3 sets Seated Scapular Retraction with External Rotation - 1 x daily - 7 x weekly - 10 reps - 3 sets  Patient Education Trigger Point Dry Needling

## 2019-08-15 NOTE — Therapy (Signed)
[facility] [address] [place], [place], [address] Phone: [phone]   Fax:  [phone]  Physical Therapy Treatment  Patient Details  Name: Terry Shannon MRN: [id number] Date of Birth: 1956/01/25 Referring Provider (PT): Sarina Ill, MD    Encounter Date: 08/15/2019   Progress Note Reporting Period 07/09/19 to 08/15/19  See note below for Objective Data and Assessment of Progress/Goals.       PT End of Session - 08/15/19 1229    Visit Number  9    Date for PT Re-Evaluation  10/02/19    Authorization Type  Medicare    Authorization Time Period  new cert sent for 02/13/25 to 10/02/19 on 08/15/19    PT Start Time  1230    PT Stop Time  1316    PT Time Calculation (min)  46 min    Activity Tolerance  Patient tolerated treatment well    Behavior During Therapy  [facility] for tasks assessed/performed       Past Medical History:  Diagnosis Date  . Allergy    year-round, pt. states  . Chronic lower back pain   . 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 Complication of anesthesia    slow to wake up after gallbladder surgery  . Depression   . Eczema    ARMS AND HANDS  . GERD (gastroesophageal reflux disease)   . History of thyroid cancer   . Hypothyroidism   . Migraines   . Osteoarthritis   . Overactive bladder   . RA (rheumatoid arthritis) ([place])   . Sleep apnea    no CPAP use  . Ulcerative colitis [facility])     Past Surgical History:  Procedure Laterality Date  . ABDOMINAL HERNIA REPAIR  83/41/9622   periumbilical ventral hernia/incisional hernia  . BUNIONECTOMY Right    x 2 more  . BUNIONECTOMY Left   . BUNIONECTOMY WITH CHILECTOMY Right 12/16/2005  . CARPAL TUNNEL RELEASE Right 07/13/2001  . CARPAL TUNNEL RELEASE Left 08/10/2001  . DILATION AND CURETTAGE OF UTERUS    . HAMMER TOE SURGERY Left may 2014  . HYSTEROSCOPY WITH D & C  03/01/2004   with exc. of endometrial polyp  . KNEE  ARTHROSCOPY Right 2013  . LAPAROSCOPIC CHOLECYSTECTOMY  11/03/2006  . LUMBAR FUSION  03/06/2014   l4  l5   . nerve ablation lumbar     . THYROIDECTOMY, PARTIAL Right prior to 2002  . THYROIDECTOMY, PARTIAL Left 11/29/2000   and isthmus  . TONSILLECTOMY  1961  . TOTAL ANKLE REPLACEMENT Left 11/2016   with tendon repair  . TOTAL KNEE ARTHROPLASTY Right 04/01/2013   Procedure: RIGHT TOTAL KNEE ARTHROPLASTY;  Surgeon: Alta Corning, MD;  Location: [place];  Service: Orthopedics;  Laterality: Right;    There were no vitals filed for this visit.  Subjective Assessment - 08/15/19 1230    Subjective  I had a HA after last visit. It went away until after her vaccine, then it returned.  Pain in neck is a 4/10 and both my shoulders hurt right greater than left starting this morning. Had my first vaccine yesterday.    Pertinent History  migraines, anxiety    How long can you sit comfortably?  mostly ok unless working on the computer or other activity    Patient Stated Goals  reduce migraines, manage neck pain    Currently in Pain?  Yes    Pain Score  4  Pain Location  Neck    Pain Orientation  Right;Left    Pain Descriptors / Indicators  Sore    Pain Type  Chronic pain         [facility] PT Assessment - 08/15/19 0001      Observation/Other Assessments   Focus on Therapeutic Outcomes (FOTO)   53% limited (39% at eval)      AROM   AROM Assessment Site  Cervical    Cervical - Right Rotation  35    Cervical - Left Rotation  40      Strength   Overall Strength Comments  Left shoulder flex/abd 4-/5; right flex 4/5, ABD 4-/5: cervical 5/5 except flexion 4+/5                   [facility] Adult PT Treatment/Exercise - 08/15/19 0001      Self-Care   Self-Care  Other Self-Care Comments    Other Self-Care Comments   Discussed plan of care at length; how to progress HEP safely      Neck Exercises: Standing   Other Standing Exercises  flex/scaption/ABD 2# x 5 each      Neck Exercises:  Supine   Capital Flexion Limitations  worked deep neck flexors doing variations with simple nod to full lift             PT Education - 08/15/19 1330    Education Details  HEP progressed for shoulder and deep neck flexors; see self care    Person(s) Educated  Patient    Methods  Explanation;Demonstration;Handout    Comprehension  Verbalized understanding;Returned demonstration       PT Short Term Goals - 08/15/19 1239      PT SHORT TERM GOAL #1   Title  Pt will demo consistency and independence with her initial HEP to decrease pain and improve posture.    Status  Achieved        PT Long Term Goals - 08/15/19 1239      PT LONG TERM GOAL #1   Title  Pt will have 4+/5 MMT strength of the UEs to decrease compensations in the neck with daily activity.    Status  On-going      PT LONG TERM GOAL #2   Title  Pt will have atleast 50 deg of active cervical rotation Lt and Rt which will help her check her blind spots while driving.    Baseline  right 35 deg, left 40 deg    Status  On-going      PT LONG TERM GOAL #3   Title  Pt will report atleast 50% improvement in her pain with daily activity to improve quality of life and activity participation.    Baseline  no change as of 08/15/19    Status  Not Met      PT LONG TERM GOAL #4   Title  Pt will have improved cervical strength and endurance, evident by her ability to maintain deep cervical flexion lift off in supine for greater than 10 sec without forward head posture.    Baseline  difficult but able to do    Status  Partially Met            Plan - 08/15/19 1336    Clinical Impression Statement  Patient presents today with reports of HA after last DN session. This resolved and then returned with bil shoulder and neck pain after receiving her first Covid vaccine yesterday. Goals were assessed and  POC discussed with patient. She has not made any significant progress with pain control and HA and wants to pursue other options  with her MD. She has gained some strength in the neck and shoulders, however many of her LTGs have not been met yet.  She will benefit from continued PT to meet her remaining goals. We discussed that due to the inconsistency of her pain and HA, she has to progress slowly with exercise and that she would benefit from continuing her HEP with a few f/u PT visits to progress it. A recert was submitted today to cover 3 visits over 6 weeks.    Personal Factors and Comorbidities  Age;Time since onset of injury/illness/exacerbation;Fitness;Comorbidity 2    Comorbidities  chronic migraines, RA    Examination-Activity Limitations  Other    Rehab Potential  Good    PT Frequency  Other (comment)   3 visits   PT Duration  6 weeks    PT Treatment/Interventions  ADLs/Self Care Home Management;Aquatic Therapy;Cryotherapy;Electrical Stimulation;Moist Heat;Therapeutic activities;Therapeutic exercise;Patient/family education;Neuromuscular re-education;Manual techniques;Dry needling;Passive range of motion;Taping    PT Next Visit Plan  Review HEP and progress as needed for shoulder strength, deep neck flexors, stabilization. Has she started a walking program.    PT Home Exercise Plan  [facility] desk posture); New code [place] and Agree with Plan of Care  Patient       Patient will benefit from skilled therapeutic intervention in order to improve the following deficits and impairments:  Improper body mechanics, Pain, Increased muscle spasms, Postural dysfunction, Hypomobility, Decreased strength, Decreased range of motion, Decreased endurance, Impaired flexibility  Visit Diagnosis: Cervicalgia - Plan: PT plan of care cert/re-cert  Abnormal posture - Plan: PT plan of care cert/re-cert  Other muscle spasm - Plan: PT plan of care cert/re-cert     Problem List Patient Active Problem List   Diagnosis Date Noted  . Chronic migraine without aura, with intractable migraine, so stated, with status  migrainosus 09/26/2015  . Radiculopathy 03/06/2014  . Osteoarthritis of right knee 04/01/2013  . OA (osteoarthritis) 11/01/2011  . OAB (overactive bladder)   . Ulcerative colitis ([place])   . Cancer ([place])   . Carpal tunnel syndrome   . RA (rheumatoid arthritis) ([place])     Madelyn Flavors PT 08/15/2019, 2:05 PM   [facility] [address] [place], [place], [address] Phone: [phone]   Fax:  [phone]  Name: Terry Shannon MRN: [id number] Date of Birth: [birth year]

## 2019-08-19 ENCOUNTER — Ambulatory Visit
Admission: RE | Admit: 2019-08-19 | Discharge: 2019-08-19 | Disposition: A | Discharge status: Home / Self Care | Payer: Medicare Other | Source: Ambulatory Visit | Attending: Internal Medicine | Admitting: Internal Medicine

## 2019-08-19 ENCOUNTER — Other Ambulatory Visit: Payer: Self-pay

## 2019-08-19 DIAGNOSIS — Z1231 Encounter for screening mammogram for malignant neoplasm of breast: Secondary | ICD-10-CM

## 2019-08-19 DIAGNOSIS — Z780 Asymptomatic menopausal state: Secondary | ICD-10-CM | POA: Diagnosis not present

## 2019-08-19 DIAGNOSIS — M8589 Other specified disorders of bone density and structure, multiple sites: Secondary | ICD-10-CM | POA: Diagnosis not present

## 2019-08-19 DIAGNOSIS — E2839 Other primary ovarian failure: Secondary | ICD-10-CM

## 2019-08-21 ENCOUNTER — Ambulatory Visit: Payer: Medicare Other

## 2019-08-21 ENCOUNTER — Other Ambulatory Visit: Payer: Self-pay

## 2019-08-21 DIAGNOSIS — M62838 Other muscle spasm: Secondary | ICD-10-CM

## 2019-08-21 DIAGNOSIS — R5382 Chronic fatigue, unspecified: Secondary | ICD-10-CM | POA: Diagnosis not present

## 2019-08-21 DIAGNOSIS — R293 Abnormal posture: Secondary | ICD-10-CM

## 2019-08-21 DIAGNOSIS — M2550 Pain in unspecified joint: Secondary | ICD-10-CM | POA: Diagnosis not present

## 2019-08-21 DIAGNOSIS — M542 Cervicalgia: Secondary | ICD-10-CM | POA: Diagnosis not present

## 2019-08-21 DIAGNOSIS — M150 Primary generalized (osteo)arthritis: Secondary | ICD-10-CM | POA: Diagnosis not present

## 2019-08-21 DIAGNOSIS — K5180 Other ulcerative colitis without complications: Secondary | ICD-10-CM | POA: Diagnosis not present

## 2019-08-21 DIAGNOSIS — Z6828 Body mass index (BMI) 28.0-28.9, adult: Secondary | ICD-10-CM | POA: Diagnosis not present

## 2019-08-21 DIAGNOSIS — M0579 Rheumatoid arthritis with rheumatoid factor of multiple sites without organ or systems involvement: Secondary | ICD-10-CM | POA: Diagnosis not present

## 2019-08-21 NOTE — Therapy (Signed)
[facility] [address] [place], [place], [address] Phone: [phone]   Fax:  [phone]  Physical Therapy Treatment  Patient Details  Name: Terry Shannon MRN: [id number] Date of Birth: 08/02/1955 Referring Provider (PT): Sarina Ill, MD    Encounter Date: 08/21/2019  PT End of Session - 08/21/19 1310    Visit Number  10    Date for PT Re-Evaluation  10/02/19    Authorization Type  Medicare    Authorization Time Period  new cert sent for 0/27/25 to 10/02/19 on 08/15/19    Progress Note Due on Visit  19    PT Start Time  1232    PT Stop Time  1306    PT Time Calculation (min)  34 min    Activity Tolerance  Patient tolerated treatment well    Behavior During Therapy  [facility] for tasks assessed/performed       Past Medical History:  Diagnosis Date  . Allergy    year-round, pt. states  . Chronic lower back pain   . 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 Complication of anesthesia    slow to wake up after gallbladder surgery  . Depression   . Eczema    ARMS AND HANDS  . GERD (gastroesophageal reflux disease)   . History of thyroid cancer   . Hypothyroidism   . Migraines   . Osteoarthritis   . Overactive bladder   . RA (rheumatoid arthritis) ([place])   . Sleep apnea    no CPAP use  . Ulcerative colitis [facility])     Past Surgical History:  Procedure Laterality Date  . ABDOMINAL HERNIA REPAIR  36/64/4034   periumbilical ventral hernia/incisional hernia  . BUNIONECTOMY Right    x 2 more  . BUNIONECTOMY Left   . BUNIONECTOMY WITH CHILECTOMY Right 12/16/2005  . CARPAL TUNNEL RELEASE Right 07/13/2001  . CARPAL TUNNEL RELEASE Left 08/10/2001  . DILATION AND CURETTAGE OF UTERUS    . HAMMER TOE SURGERY Left may 2014  . HYSTEROSCOPY WITH D & C  03/01/2004   with exc. of endometrial polyp  . KNEE ARTHROSCOPY Right 2013  . LAPAROSCOPIC CHOLECYSTECTOMY  11/03/2006  . LUMBAR FUSION  03/06/2014   l4  l5    . nerve ablation lumbar     . THYROIDECTOMY, PARTIAL Right prior to 2002  . THYROIDECTOMY, PARTIAL Left 11/29/2000   and isthmus  . TONSILLECTOMY  1961  . TOTAL ANKLE REPLACEMENT Left 11/2016   with tendon repair  . TOTAL KNEE ARTHROPLASTY Right 04/01/2013   Procedure: RIGHT TOTAL KNEE ARTHROPLASTY;  Surgeon: Alta Corning, MD;  Location: [place];  Service: Orthopedics;  Laterality: Right;    There were no vitals filed for this visit.  Subjective Assessment - 08/21/19 1234    Subjective  I am on the verge of a migraine.  I have taken medicine and hope it won't become full blown.    Currently in Pain?  Yes    Pain Score  3     Pain Location  Neck    Pain Orientation  Right;Left    Pain Descriptors / Indicators  Sore    Pain Type  Chronic pain    Pain Onset  More than a month ago    Pain Frequency  Intermittent    Aggravating Factors   any activity, exercises    Pain Relieving Factors  medication  [place] Adult PT Treatment/Exercise - 08/21/19 0001      Neck Exercises: Theraband   Shoulder External Rotation  Red;20 reps    Shoulder External Rotation Limitations  seated and supine    Horizontal ABduction  20 reps;Red   seated and supine   Other Theraband Exercises  supine D2 with red x10      Neck Exercises: Standing   Other Standing Exercises  flex/scaption/ABD 1# x 10 each-performed seated      Neck Exercises: Supine   Capital Flexion Limitations  worked deep neck flexors doing variations with simple nod to full lift    Other Supine Exercise  cervical A/ROM rotation x 3 each               PT Short Term Goals - 08/15/19 1239      PT SHORT TERM GOAL #1   Title  Pt will demo consistency and independence with her initial HEP to decrease pain and improve posture.    Status  Achieved        PT Long Term Goals - 08/15/19 1239      PT LONG TERM GOAL #1   Title  Pt will have 4+/5 MMT strength of the UEs to decrease compensations  in the neck with daily activity.    Status  On-going      PT LONG TERM GOAL #2   Title  Pt will have atleast 50 deg of active cervical rotation Lt and Rt which will help her check her blind spots while driving.    Baseline  right 35 deg, left 40 deg    Status  On-going      PT LONG TERM GOAL #3   Title  Pt will report atleast 50% improvement in her pain with daily activity to improve quality of life and activity participation.    Baseline  no change as of 08/15/19    Status  Not Met      PT LONG TERM GOAL #4   Title  Pt will have improved cervical strength and endurance, evident by her ability to maintain deep cervical flexion lift off in supine for greater than 10 sec without forward head posture.    Baseline  difficult but able to do    Status  Partially Met            Plan - 08/21/19 1309    Clinical Impression Statement  PT has discontinued dry needling due to aggravation of headache symptoms after this treatment.   She will benefit from continued PT to meet her remaining goals of improved upper extremity and postural strength to assist with ADLs. Session was spent with review of HEP for postural strength and verbal cues were provided for postural alignment and to avoid substitution.  Pt will return to PT in 3 weeks for review and advancement of HEP.    PT Frequency  Other (comment)    PT Duration  6 weeks    PT Treatment/Interventions  ADLs/Self Care Home Management;Aquatic Therapy;Cryotherapy;Electrical Stimulation;Moist Heat;Therapeutic activities;Therapeutic exercise;Patient/family education;Neuromuscular re-education;Manual techniques;Dry needling;Passive range of motion;Taping    PT Next Visit Plan  advance HEP as tolerated, postural strength    PT Home Exercise Plan  [facility] desk posture); New code [facility]    Recommended Other Services  recert is signed    Consulted and Agree with Plan of Care  Patient       Patient will benefit from skilled therapeutic intervention  in order to improve the following deficits and  impairments:  Improper body mechanics, Pain, Increased muscle spasms, Postural dysfunction, Hypomobility, Decreased strength, Decreased range of motion, Decreased endurance, Impaired flexibility  Visit Diagnosis: Cervicalgia  Abnormal posture  Other muscle spasm     Problem List Patient Active Problem List   Diagnosis Date Noted  . Chronic migraine without aura, with intractable migraine, so stated, with status migrainosus 09/26/2015  . Radiculopathy 03/06/2014  . Osteoarthritis of right knee 04/01/2013  . OA (osteoarthritis) 11/01/2011  . OAB (overactive bladder)   . Ulcerative colitis ([place])   . Cancer ([place])   . Carpal tunnel syndrome   . RA (rheumatoid arthritis) ([place])     Sigurd Sos, PT 08/21/19 1:12 PM   [facility] [address] [place], [place], [address] Phone: [phone]   Fax:  [phone]  Name: Gaylene Moylan MRN: [id number] Date of Birth: 01-13-1956

## 2019-09-02 ENCOUNTER — Telehealth: Payer: Self-pay | Admitting: Neurology

## 2019-09-02 NOTE — Telephone Encounter (Signed)
Patient called to reschedule botox apt

## 2019-09-03 DIAGNOSIS — B372 Candidiasis of skin and nail: Secondary | ICD-10-CM | POA: Diagnosis not present

## 2019-09-03 DIAGNOSIS — E785 Hyperlipidemia, unspecified: Secondary | ICD-10-CM | POA: Diagnosis not present

## 2019-09-03 DIAGNOSIS — E890 Postprocedural hypothyroidism: Secondary | ICD-10-CM | POA: Diagnosis not present

## 2019-09-03 DIAGNOSIS — G609 Hereditary and idiopathic neuropathy, unspecified: Secondary | ICD-10-CM | POA: Diagnosis not present

## 2019-09-03 DIAGNOSIS — M069 Rheumatoid arthritis, unspecified: Secondary | ICD-10-CM | POA: Diagnosis not present

## 2019-09-03 DIAGNOSIS — K5190 Ulcerative colitis, unspecified, without complications: Secondary | ICD-10-CM | POA: Diagnosis not present

## 2019-09-03 NOTE — Telephone Encounter (Signed)
Returned patients call.  Appointment was rescheduled.

## 2019-09-11 ENCOUNTER — Other Ambulatory Visit: Payer: Self-pay

## 2019-09-11 ENCOUNTER — Ambulatory Visit: Payer: Medicare Other | Attending: Neurology

## 2019-09-11 DIAGNOSIS — M542 Cervicalgia: Secondary | ICD-10-CM | POA: Insufficient documentation

## 2019-09-11 DIAGNOSIS — R293 Abnormal posture: Secondary | ICD-10-CM | POA: Diagnosis not present

## 2019-09-11 DIAGNOSIS — M62838 Other muscle spasm: Secondary | ICD-10-CM | POA: Diagnosis not present

## 2019-09-11 NOTE — Therapy (Signed)
[facility] [address] [place], [place], [address] Phone: [phone]   Fax:  [phone]  Physical Therapy Treatment  Patient Details  Name: Terry Shannon MRN: [id number] Date of Birth: August 24, 1955 Referring Provider (PT): Sarina Ill, MD    Encounter Date: 09/11/2019  PT End of Session - 09/11/19 1304    Visit Number  11    Date for PT Re-Evaluation  10/02/19    Authorization Type  Medicare    Progress Note Due on Visit  79    PT Start Time  1232    PT Stop Time  1303    PT Time Calculation (min)  31 min    Activity Tolerance  Patient tolerated treatment well    Behavior During Therapy  [facility] for tasks assessed/performed       Past Medical History:  Diagnosis Date  . Allergy    year-round, pt. states  . Chronic lower back pain   . 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 Complication of anesthesia    slow to wake up after gallbladder surgery  . Depression   . Eczema    ARMS AND HANDS  . GERD (gastroesophageal reflux disease)   . History of thyroid cancer   . Hypothyroidism   . Migraines   . Osteoarthritis   . Overactive bladder   . RA (rheumatoid arthritis) ([place])   . Sleep apnea    no CPAP use  . Ulcerative colitis [facility])     Past Surgical History:  Procedure Laterality Date  . ABDOMINAL HERNIA REPAIR  45/14/6047   periumbilical ventral hernia/incisional hernia  . BUNIONECTOMY Right    x 2 more  . BUNIONECTOMY Left   . BUNIONECTOMY WITH CHILECTOMY Right 12/16/2005  . CARPAL TUNNEL RELEASE Right 07/13/2001  . CARPAL TUNNEL RELEASE Left 08/10/2001  . DILATION AND CURETTAGE OF UTERUS    . HAMMER TOE SURGERY Left may 2014  . HYSTEROSCOPY WITH D & C  03/01/2004   with exc. of endometrial polyp  . KNEE ARTHROSCOPY Right 2013  . LAPAROSCOPIC CHOLECYSTECTOMY  11/03/2006  . LUMBAR FUSION  03/06/2014   l4  l5   . nerve ablation lumbar     . THYROIDECTOMY, PARTIAL Right prior to 2002   . THYROIDECTOMY, PARTIAL Left 11/29/2000   and isthmus  . TONSILLECTOMY  1961  . TOTAL ANKLE REPLACEMENT Left 11/2016   with tendon repair  . TOTAL KNEE ARTHROPLASTY Right 04/01/2013   Procedure: RIGHT TOTAL KNEE ARTHROPLASTY;  Surgeon: Alta Corning, MD;  Location: [place];  Service: Orthopedics;  Laterality: Right;    There were no vitals filed for this visit.  Subjective Assessment - 09/11/19 1234    Subjective  I think I am seeing some improvements.  I have been able to do my exercises 2 times without getting a headache.  I have been doing rotation stretch and chin tucks in between sets.  20% overall improvment.    Patient Stated Goals  reduce migraines, manage neck pain         [facility] PT Assessment - 09/11/19 0001      AROM   Cervical Flexion  55    Cervical - Right Side Bend  30    Cervical - Left Side Bend  40    Cervical - Right Rotation  55    Cervical - Left Rotation  60  [facility] Adult PT Treatment/Exercise - 09/11/19 0001      Neck Exercises: Theraband   Shoulder Extension  20 reps;Red    Shoulder Extension Limitations  excellent form    Rows  20 reps;Red    Rows Limitations  excellent form      Neck Exercises: Standing   Other Standing Exercises  flex/scaption/ABD 1# x 10 each-performed seated with core activation      Neck Exercises: Supine   Other Supine Exercise  cervical A/ROM rotation, flexion and sidebending x 3 each             PT Education - 09/11/19 1304    Education Details  how to incorporate core into exercises, perform exercises sitting vs standing to reduce lumbar strain    Person(s) Educated  Patient    Methods  Explanation;Demonstration;Handout    Comprehension  Verbalized understanding;Returned demonstration       PT Short Term Goals - 08/15/19 1239      PT SHORT TERM GOAL #1   Title  Pt will demo consistency and independence with her initial HEP to decrease pain and improve posture.    Status   Achieved        PT Long Term Goals - 09/11/19 1239      PT LONG TERM GOAL #2   Title  Pt will have atleast 50 deg of active cervical rotation Lt and Rt which will help her check her blind spots while driving.    Baseline  met    Status  Achieved      PT LONG TERM GOAL #3   Title  Pt will report atleast 50% improvement in her pain with daily activity to improve quality of life and activity participation.    Baseline  20% better    Time  6    Period  Weeks    Status  On-going            Plan - 09/11/19 1250    Clinical Impression Statement  Pt reports 20% overall improvement in symptoms since the start of care.  Pt has been able to complete HEP x 2 without production of headache.  Pt reports reduced frequency and intensity of headaches overall.  Pt demonstrates improved cervical A/ROM overall.  Pt is currently challenged by her HEP and is performing with good form.  Pt had advanced her 3 way raises to 2# weights in standing and was having scapular pain and LBP.  PT modified this to sitting with abdominal engagement and only 1# weights.  Pt will return in a few weeks for reassessment and to make modifications of HEP as needed.    PT Treatment/Interventions  ADLs/Self Care Home Management;Aquatic Therapy;Cryotherapy;Electrical Stimulation;Moist Heat;Therapeutic activities;Therapeutic exercise;Patient/family education;Neuromuscular re-education;Manual techniques;Dry needling;Passive range of motion;Taping       Patient will benefit from skilled therapeutic intervention in order to improve the following deficits and impairments:  Improper body mechanics, Pain, Increased muscle spasms, Postural dysfunction, Hypomobility, Decreased strength, Decreased range of motion, Decreased endurance, Impaired flexibility  Visit Diagnosis: Abnormal posture  Cervicalgia  Other muscle spasm     Problem List Patient Active Problem List   Diagnosis Date Noted  . Chronic migraine without aura,  with intractable migraine, so stated, with status migrainosus 09/26/2015  . Radiculopathy 03/06/2014  . Osteoarthritis of right knee 04/01/2013  . OA (osteoarthritis) 11/01/2011  . OAB (overactive bladder)   . Ulcerative colitis ([place])   . Cancer ([place])   . Carpal tunnel syndrome   .  RA (rheumatoid arthritis) ([place])     Sigurd Sos, PT 09/11/19 1:05 PM   [facility] [address] [place], [place], [address] Phone: [phone]   Fax:  [phone]  Name: Terry Shannon MRN: [id number] Date of Birth: 1956-04-11

## 2019-10-02 ENCOUNTER — Other Ambulatory Visit: Payer: Self-pay

## 2019-10-02 ENCOUNTER — Ambulatory Visit: Payer: Medicare Other | Attending: Neurology

## 2019-10-02 DIAGNOSIS — R293 Abnormal posture: Secondary | ICD-10-CM | POA: Diagnosis not present

## 2019-10-02 DIAGNOSIS — M542 Cervicalgia: Secondary | ICD-10-CM | POA: Diagnosis not present

## 2019-10-02 DIAGNOSIS — M62838 Other muscle spasm: Secondary | ICD-10-CM | POA: Diagnosis not present

## 2019-10-02 NOTE — Therapy (Signed)
[facility] [address], [place], [place], [address] Phone: [phone]   Fax:  [phone]  Physical Therapy Treatment  Patient Details  Name: Terry Shannon MRN: [id number] Date of Birth: July 10, 1955 Referring Provider (PT): Sarina Ill, MD    Encounter Date: 10/02/2019  PT End of Session - 10/02/19 1308    Visit Number  12    Authorization Type  Medicare    Progress Note Due on Visit  26    PT Start Time  1233    PT Stop Time  1305    PT Time Calculation (min)  32 min    Activity Tolerance  Patient tolerated treatment well    Behavior During Therapy  [facility] for tasks assessed/performed       Past Medical History:  Diagnosis Date  . Allergy    year-round, pt. states  . Chronic lower back pain   . 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 Complication of anesthesia    slow to wake up after gallbladder surgery  . Depression   . Eczema    ARMS AND HANDS  . GERD (gastroesophageal reflux disease)   . History of thyroid cancer   . Hypothyroidism   . Migraines   . Osteoarthritis   . Overactive bladder   . RA (rheumatoid arthritis) ([place])   . Sleep apnea    no CPAP use  . Ulcerative colitis [facility])     Past Surgical History:  Procedure Laterality Date  . ABDOMINAL HERNIA REPAIR  34/28/7681   periumbilical ventral hernia/incisional hernia  . BUNIONECTOMY Right    x 2 more  . BUNIONECTOMY Left   . BUNIONECTOMY WITH CHILECTOMY Right 12/16/2005  . CARPAL TUNNEL RELEASE Right 07/13/2001  . CARPAL TUNNEL RELEASE Left 08/10/2001  . DILATION AND CURETTAGE OF UTERUS    . HAMMER TOE SURGERY Left may 2014  . HYSTEROSCOPY WITH D & C  03/01/2004   with exc. of endometrial polyp  . KNEE ARTHROSCOPY Right 2013  . LAPAROSCOPIC CHOLECYSTECTOMY  11/03/2006  . LUMBAR FUSION  03/06/2014   l4  l5   . nerve ablation lumbar     . THYROIDECTOMY, PARTIAL Right prior to 2002  . THYROIDECTOMY, PARTIAL Left 11/29/2000    and isthmus  . TONSILLECTOMY  1961  . TOTAL ANKLE REPLACEMENT Left 11/2016   with tendon repair  . TOTAL KNEE ARTHROPLASTY Right 04/01/2013   Procedure: RIGHT TOTAL KNEE ARTHROPLASTY;  Surgeon: Alta Corning, MD;  Location: [facility];  Service: Orthopedics;  Laterality: Right;    There were no vitals filed for this visit.  Subjective Assessment - 10/02/19 1232    Subjective  2 weeks ago I was doing great.  I had gone a week without a headache.  I did the supine neck exercises and then the headaches started again.  I am stronger overall and 40% better.    Currently in Pain?  Yes    Pain Score  2     Pain Location  Neck    Pain Orientation  Right;Left    Pain Descriptors / Indicators  Sore    Pain Onset  More than a month ago    Pain Frequency  Intermittent    Aggravating Factors   activity, life, neck exercises    Pain Relieving Factors  medication         [facility] PT Assessment - 10/02/19 0001      Assessment   Medical Diagnosis  chronic migraines without aura     Referring Provider (PT)  Sarina Ill, MD       AROM   Cervical - Right Rotation  70    Cervical - Left Rotation  75      Strength   Overall Strength Comments  4+/5 bil UE strength throughout                    [facility] Adult PT Treatment/Exercise - 10/02/19 0001      Neck Exercises: Theraband   Shoulder External Rotation  Green;20 reps    Horizontal ADduction  20 reps;Green      Neck Exercises: Standing   Other Standing Exercises  standing against wall with all points touching -demo and pt returned demo      Neck Exercises: Seated   Other Seated Exercise  3 way raises and ER with 1# 2x10      Neck Exercises: Stretches   Other Neck Stretches  cervical rotation stretch 2x30 sec ea               PT Short Term Goals - 08/15/19 1239      PT SHORT TERM GOAL #1   Title  Pt will demo consistency and independence with her initial HEP to decrease pain and improve posture.    Status  Achieved         PT Long Term Goals - 10/02/19 1238      PT LONG TERM GOAL #1   Title  Pt will have 4+/5 MMT strength of the UEs to decrease compensations in the neck with daily activity.    Status  Achieved      PT LONG TERM GOAL #2   Title  Pt will have atleast 50 deg of active cervical rotation Lt and Rt which will help her check her blind spots while driving.    Status  Achieved      PT LONG TERM GOAL #3   Title  Pt will report atleast 50% improvement in her pain with daily activity to improve quality of life and activity participation.    Baseline  40% improvement    Status  Partially Met      PT LONG TERM GOAL #4   Title  Pt will have improved cervical strength and endurance, evident by her ability to maintain deep cervical flexion lift off in supine for greater than 10 sec without forward head posture.    Baseline  too much pain-triggers headache    Status  Not Met            Plan - 10/02/19 1306    Clinical Impression Statement  Pt is ready for D/C to HEP.  Pt reports 40% overall improvement in neck pain and headaches. Pt has improved postural awareness and cervical A/ROM and UE strength is improved.  PT spent session reviewing HEP and discussing advancements.  PT eliminated cervical deep flexor activation as it aggravates headaches.  This was replaced by neutral standing against the wall to activate postural stabilizers.  Pt will D/C to HEP today.    PT Next Visit Plan  D/C PT to HEP    PT Home Exercise Plan  [facility] desk posture); New code [place] and Agree with Plan of Care  Patient       Patient will benefit from skilled therapeutic intervention in order to improve the following deficits and impairments:     Visit Diagnosis: Abnormal posture  Cervicalgia  Other muscle spasm     Problem List Patient Active Problem List   Diagnosis Date Noted  . Chronic migraine without aura, with intractable migraine, so stated, with status migrainosus 09/26/2015   . Radiculopathy 03/06/2014  . Osteoarthritis of right knee 04/01/2013  . OA (osteoarthritis) 11/01/2011  . OAB (overactive bladder)   . Ulcerative colitis ([place])   . Cancer ([place])   . Carpal tunnel syndrome   . RA (rheumatoid arthritis) ([place])    PHYSICAL THERAPY DISCHARGE SUMMARY  Visits from Start of Care: 12  Current functional level related to goals / functional outcomes: See above for current goal status.   Remaining deficits: Pt with chronic headaches that remain.  Pt reports 40% overall reduction.     Education / Equipment: HEP, posture Plan: Patient agrees to discharge.  Patient goals were partially met. Patient is being discharged due to being pleased with the current functional level.  ?????        Sigurd Sos, PT 10/02/19 1:09 PM  [facility] [address] [place], [place], [address] Phone: [phone]   Fax:  [phone]  Name: Terry Shannon MRN: [id number] Date of Birth: 1955/11/26

## 2019-10-08 DIAGNOSIS — H04123 Dry eye syndrome of bilateral lacrimal glands: Secondary | ICD-10-CM | POA: Diagnosis not present

## 2019-10-29 ENCOUNTER — Ambulatory Visit: Payer: Medicare Other | Admitting: Neurology

## 2019-10-31 ENCOUNTER — Encounter: Payer: Self-pay | Admitting: Neurology

## 2019-10-31 ENCOUNTER — Ambulatory Visit: Payer: Medicare Other | Admitting: Neurology

## 2019-10-31 ENCOUNTER — Telehealth: Payer: Self-pay | Admitting: *Deleted

## 2019-10-31 NOTE — Telephone Encounter (Signed)
The patient no showed her botox appointment today.

## 2019-11-05 NOTE — Progress Notes (Signed)
Consent Form Botulism Toxin Injection For Chronic Migraine  Interval history November 06, 2019: 50% improvement This is a 64 year old female with chronic migraines, worsening, she has failed multiple medications, she also has exercise-induced headaches, she is on propranolol, zonisamide, we stented her dry needling, she is not using her CPAP and I did discuss that with her.  She takes Imitrex and Reglan at onset of headache.  She is tried and failed multiple medications:Topamax, amitriptyline, gabapentin, Flexeril, propranolol, Pristiq, Reglan, tylenol, nsaids, asa, and she has tried a lot that she can't remember and was on these for years, stopped working.Taking Zonegran now.  She was doing well on propranolol and sonogram but I started feeling as well and we started Botox this is her second Botox.  Baseline is daily migraines and headaches.  Reviewed orally with patient, additionally signature is on file:  Botulism toxin has been approved by the Federal drug administration for treatment of chronic migraine. Botulism toxin does not cure chronic migraine and it may not be effective in some patients.  The administration of botulism toxin is accomplished by injecting a small amount of toxin into the muscles of the neck and head. Dosage must be titrated for each individual. Any benefits resulting from botulism toxin tend to wear off after 3 months with a repeat injection required if benefit is to be maintained. Injections are usually done every 3-4 months with maximum effect peak achieved by about 2 or 3 weeks. Botulism toxin is expensive and you should be sure of what costs you will incur resulting from the injection.  The side effects of botulism toxin use for chronic migraine may include:   -Transient, and usually mild, facial weakness with facial injections  -Transient, and usually mild, head or neck weakness with head/neck injections  -Reduction or loss of forehead facial animation due to forehead  muscle weakness  -Eyelid drooping  -Dry eye  -Pain at the site of injection or bruising at the site of injection  -Double vision  -Potential unknown long term risks  Contraindications: You should not have Botox if you are pregnant, nursing, allergic to albumin, have an infection, skin condition, or muscle weakness at the site of the injection, or have myasthenia gravis, Lambert-Eaton syndrome, or ALS.  It is also possible that as with any injection, there may be an allergic reaction or no effect from the medication. Reduced effectiveness after repeated injections is sometimes seen and rarely infection at the injection site may occur. All care will be taken to prevent these side effects. If therapy is given over a long time, atrophy and wasting in the muscle injected may occur. Occasionally the patient's become refractory to treatment because they develop antibodies to the toxin. In this event, therapy needs to be modified.  I have read the above information and consent to the administration of botulism toxin.    BOTOX PROCEDURE NOTE FOR MIGRAINE HEADACHE    Contraindications and precautions discussed with patient(above). Aseptic procedure was observed and patient tolerated procedure. Procedure performed by Dr. Georgia Dom  The condition has existed for more than 6 months, and pt does not have a diagnosis of ALS, Myasthenia Gravis or Lambert-Eaton Syndrome.  Risks and benefits of injections discussed and pt agrees to proceed with the procedure.  Written consent obtained  These injections are medically necessary. Pt  receives good benefits from these injections. These injections do not cause sedations or hallucinations which the oral therapies may cause.  Description of procedure:  The patient was placed  in a sitting position. The standard protocol was used for Botox as follows, with 5 units of Botox injected at each site:   -Procerus muscle, midline injection  -Corrugator muscle,  bilateral injection  -Frontalis muscle, bilateral injection, with 2 sites each side, medial injection was performed in the upper one third of the frontalis muscle, in the region vertical from the medial inferior edge of the superior orbital rim. The lateral injection was again in the upper one third of the forehead vertically above the lateral limbus of the cornea, 1.5 cm lateral to the medial injection site.  -Temporalis muscle injection, 4 sites, bilaterally. The first injection was 3 cm above the tragus of the ear, second injection site was 1.5 cm to 3 cm up from the first injection site in line with the tragus of the ear. The third injection site was 1.5-3 cm forward between the first 2 injection sites. The fourth injection site was 1.5 cm posterior to the second injection site.   -Occipitalis muscle injection, 3 sites, bilaterally. The first injection was done one half way between the occipital protuberance and the tip of the mastoid process behind the ear. The second injection site was done lateral and superior to the first, 1 fingerbreadth from the first injection. The third injection site was 1 fingerbreadth superiorly and medially from the first injection site.  -Cervical paraspinal muscle injection, 2 sites, bilateral knee first injection site was 1 cm from the midline of the cervical spine, 3 cm inferior to the lower border of the occipital protuberance. The second injection site was 1.5 cm superiorly and laterally to the first injection site.  -Trapezius muscle injection was performed at 3 sites, bilaterally. The first injection site was in the upper trapezius muscle halfway between the inflection point of the neck, and the acromion. The second injection site was one half way between the acromion and the first injection site. The third injection was done between the first injection site and the inflection point of the neck.   Will return for repeat injection in 3 months.   200 units of  Botox was used, any Botox not injected was wasted. The patient tolerated the procedure well, there were no complications of the above procedure.

## 2019-11-06 ENCOUNTER — Ambulatory Visit (INDEPENDENT_AMBULATORY_CARE_PROVIDER_SITE_OTHER): Payer: Medicare Other | Admitting: Neurology

## 2019-11-06 DIAGNOSIS — G43711 Chronic migraine without aura, intractable, with status migrainosus: Secondary | ICD-10-CM | POA: Diagnosis not present

## 2019-11-06 NOTE — Progress Notes (Signed)
Botox- 200 units x 1 vial Lot: [id number] Expiration: 07/2022 NDC: 9038-3338-32  Bacteriostatic 0.9% Sodium Chloride- 64mL total Lot: [id number] Expiration: 11/21/2019 NDC: 0600-4599-77  Dx: S14.239 B/B

## 2019-11-11 ENCOUNTER — Encounter: Payer: Self-pay | Admitting: *Deleted

## 2019-11-13 ENCOUNTER — Ambulatory Visit: Payer: 59 | Admitting: Physician Assistant

## 2019-12-05 DIAGNOSIS — M2021 Hallux rigidus, right foot: Secondary | ICD-10-CM | POA: Diagnosis not present

## 2019-12-05 DIAGNOSIS — M19071 Primary osteoarthritis, right ankle and foot: Secondary | ICD-10-CM | POA: Diagnosis not present

## 2019-12-05 DIAGNOSIS — M2041 Other hammer toe(s) (acquired), right foot: Secondary | ICD-10-CM | POA: Insufficient documentation

## 2019-12-05 DIAGNOSIS — Z96662 Presence of left artificial ankle joint: Secondary | ICD-10-CM | POA: Diagnosis not present

## 2019-12-05 DIAGNOSIS — M19171 Post-traumatic osteoarthritis, right ankle and foot: Secondary | ICD-10-CM | POA: Diagnosis not present

## 2019-12-05 DIAGNOSIS — M810 Age-related osteoporosis without current pathological fracture: Secondary | ICD-10-CM | POA: Diagnosis not present

## 2019-12-05 DIAGNOSIS — M19172 Post-traumatic osteoarthritis, left ankle and foot: Secondary | ICD-10-CM | POA: Diagnosis not present

## 2019-12-05 DIAGNOSIS — M19072 Primary osteoarthritis, left ankle and foot: Secondary | ICD-10-CM | POA: Diagnosis not present

## 2019-12-05 DIAGNOSIS — Z01818 Encounter for other preprocedural examination: Secondary | ICD-10-CM | POA: Diagnosis not present

## 2019-12-16 ENCOUNTER — Other Ambulatory Visit: Payer: Self-pay

## 2019-12-16 ENCOUNTER — Ambulatory Visit (INDEPENDENT_AMBULATORY_CARE_PROVIDER_SITE_OTHER): Payer: Medicare Other | Admitting: Physician Assistant

## 2019-12-16 ENCOUNTER — Encounter: Payer: Self-pay | Admitting: Physician Assistant

## 2019-12-16 DIAGNOSIS — E039 Hypothyroidism, unspecified: Secondary | ICD-10-CM | POA: Insufficient documentation

## 2019-12-16 DIAGNOSIS — L720 Epidermal cyst: Secondary | ICD-10-CM | POA: Diagnosis not present

## 2019-12-16 DIAGNOSIS — F3289 Other specified depressive episodes: Secondary | ICD-10-CM | POA: Insufficient documentation

## 2019-12-16 DIAGNOSIS — E785 Hyperlipidemia, unspecified: Secondary | ICD-10-CM | POA: Insufficient documentation

## 2019-12-16 DIAGNOSIS — E663 Overweight: Secondary | ICD-10-CM | POA: Insufficient documentation

## 2019-12-16 DIAGNOSIS — L570 Actinic keratosis: Secondary | ICD-10-CM

## 2019-12-16 DIAGNOSIS — L814 Other melanin hyperpigmentation: Secondary | ICD-10-CM

## 2019-12-16 DIAGNOSIS — E559 Vitamin D deficiency, unspecified: Secondary | ICD-10-CM | POA: Insufficient documentation

## 2019-12-16 DIAGNOSIS — M5136 Other intervertebral disc degeneration, lumbar region: Secondary | ICD-10-CM | POA: Insufficient documentation

## 2019-12-16 DIAGNOSIS — Z86007 Personal history of in-situ neoplasm of skin: Secondary | ICD-10-CM

## 2019-12-16 DIAGNOSIS — G4700 Insomnia, unspecified: Secondary | ICD-10-CM | POA: Insufficient documentation

## 2019-12-16 DIAGNOSIS — G43909 Migraine, unspecified, not intractable, without status migrainosus: Secondary | ICD-10-CM | POA: Insufficient documentation

## 2019-12-16 DIAGNOSIS — E890 Postprocedural hypothyroidism: Secondary | ICD-10-CM | POA: Insufficient documentation

## 2019-12-16 DIAGNOSIS — R7309 Other abnormal glucose: Secondary | ICD-10-CM | POA: Insufficient documentation

## 2019-12-16 NOTE — Progress Notes (Signed)
   Follow up Visit  Subjective  Terry Shannon is a 64 y.o. female who presents for the following: Skin Problem (whitehead that has been present for about a year and a half, tried various treatments history of some pus). Bump under right lower lip that started like a whitehead but wont clear. She has picked at it and gotten a piece of it out. It has been there 1.5 years. She also has a spot on the left cheek that has been there a year and started as a red spot then seemed to come up like a boil but was never sore.   Objective  Well appearing patient in no apparent distress; mood and affect are within normal limits.  A focused examination was performed including face, chest and back. Relevant physical exam findings are noted in the Assessment and Plan.   Objective  Chest - Medial [facility]): Dyspigmented scar. 2019.  Objective  Left Lower Cutaneous Lip, Left Oral Commissure, Mid Lower Cutaneous Lip, Right Lower Vermilion Lip : 4 small white papules along lower lip margin  Objective  Left Eyebrow, Left Malar Cheek, Right Forehead, Right Malar Cheek: Scattered tan macules.   Objective  Head - Anterior (Face): Erythematous patches with gritty scale.  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 No charge.  Lentigines (4) Left Eyebrow; Left Malar Cheek; Right Malar Cheek; Right Forehead  AK (actinic keratosis) Head - Anterior (Face)  Patient is interested in PDT for the fall or winter. We discussed the process and the side effects and the pros and cons vs. Using the cream. She will check with her [place] and call if It Is something she is interested in.

## 2019-12-19 DIAGNOSIS — K641 Second degree hemorrhoids: Secondary | ICD-10-CM | POA: Diagnosis not present

## 2019-12-19 DIAGNOSIS — Z800 Family history of malignant neoplasm of digestive organs: Secondary | ICD-10-CM | POA: Diagnosis not present

## 2019-12-19 DIAGNOSIS — Z1211 Encounter for screening for malignant neoplasm of colon: Secondary | ICD-10-CM | POA: Diagnosis not present

## 2019-12-19 DIAGNOSIS — K51011 Ulcerative (chronic) pancolitis with rectal bleeding: Secondary | ICD-10-CM | POA: Diagnosis not present

## 2020-01-23 DIAGNOSIS — J329 Chronic sinusitis, unspecified: Secondary | ICD-10-CM | POA: Diagnosis not present

## 2020-01-23 DIAGNOSIS — Z20828 Contact with and (suspected) exposure to other viral communicable diseases: Secondary | ICD-10-CM | POA: Diagnosis not present

## 2020-01-29 DIAGNOSIS — Z1211 Encounter for screening for malignant neoplasm of colon: Secondary | ICD-10-CM | POA: Diagnosis not present

## 2020-01-29 DIAGNOSIS — K5190 Ulcerative colitis, unspecified, without complications: Secondary | ICD-10-CM | POA: Diagnosis not present

## 2020-02-04 ENCOUNTER — Ambulatory Visit: Payer: Medicare Other | Admitting: Neurology

## 2020-02-11 NOTE — Progress Notes (Signed)
Patient has other issues discussed with me today:  Fatigue and prior diagnosis of OSA: She was diagnosed with sleep apnea years ago, couldn't tolerate, but interested in trying again, she would need a sleep evaluation again. +snoring. Fatigue.  She has seen Dr. Brett Fairy in the past for headaches but was never evaluated for sleep apnea, she has a diagnosis of sleep apnea, last sleep test was many years ago, is contribute to multiple issues and health problems including her fatigue, will refer back to sleep studies Dr. Brett Fairy.  Chronic neck pains:  , she has chronic neck pain and we sent her to physical therapy and dry needling, she has had it for many years, last MRI in 2016 (personally reviewed images) showed multilevel spondylosis and degenerative changes, she is tried other conservative measures, at this time I probably be a good idea to repeat MRI of the cervical spine for cervical stenosis and a radiculopathy and pursue interventional treatment such as epidural steroid injections, medial branch blocks, or other possibly surgical.Dry needling helped sand the exercises helped. She has a lot of neck pain, will repeat MRI cervical spine and then send to Dr. Mina Marble for interventional procedures for her severe neck pain   Orders Placed This Encounter  Procedures   MR CERVICAL SPINE WO CONTRAST   Ambulatory referral to Sleep Studies   Meds ordered this encounter  Medications   Galcanezumab-gnlm (EMGALITY) 120 MG/ML SOAJ    Sig: Inject 120 mg into the skin every 30 (thirty) days. Next injection 10/22    Dispense:  2 mL    Refill:  0   I spent 20 minutes of face-to-face and non-face-to-face time with patient on the  1. Fatigue  2. Cervical radiculopathy   3. Cervical spondylosis with radiculopathy   4. Chronic neck pain   5. Snoring   6. OSA (obstructive sleep apnea)    diagnosis.  This included previsit chart review, lab review, study review, order entry, electronic health record  documentation, patient education on the different diagnostic and therapeutic options, counseling and coordination of care, risks and benefits of management, compliance, or risk factor reduction. This does not include time spent on botox procedure.   Consent Form Botulism Toxin Injection For Chronic Migraine  Interval history 02/12/2020: Baseline is daily migraines. This is her third botox injections. We will see her improvement at next appointment as you need 3 botox injections for maximum efficacy.   Interval history November 06, 2019: 50% improvement This is a 64 year old female with chronic migraines, worsening, she has failed multiple medications, she also has exercise-induced headaches, she is on propranolol, zonisamide, we sent her to her dry needling, she is not using her CPAP and I did discuss that with her.  She takes Imitrex and Reglan at onset of headache.  She is tried and failed multiple medications:Topamax, amitriptyline, gabapentin, Flexeril, propranolol, Pristiq, Reglan, tylenol, nsaids, asa, and she has tried a lot that she can't remember and was on these for years, stopped working.Taking Zonegran now.  started Botox this is her third Botox.  Baseline is daily migraines and headaches.  Reviewed orally with patient, additionally signature is on file:  Botulism toxin has been approved by the Federal drug administration for treatment of chronic migraine. Botulism toxin does not cure chronic migraine and it may not be effective in some patients.  The administration of botulism toxin is accomplished by injecting a small amount of toxin into the muscles of the neck and head. Dosage must be titrated for  each individual. Any benefits resulting from botulism toxin tend to wear off after 3 months with a repeat injection required if benefit is to be maintained. Injections are usually done every 3-4 months with maximum effect peak achieved by about 2 or 3 weeks. Botulism toxin is expensive and you  should be sure of what costs you will incur resulting from the injection.  The side effects of botulism toxin use for chronic migraine may include:   -Transient, and usually mild, facial weakness with facial injections  -Transient, and usually mild, head or neck weakness with head/neck injections  -Reduction or loss of forehead facial animation due to forehead muscle weakness  -Eyelid drooping  -Dry eye  -Pain at the site of injection or bruising at the site of injection  -Double vision  -Potential unknown long term risks  Contraindications: You should not have Botox if you are pregnant, nursing, allergic to albumin, have an infection, skin condition, or muscle weakness at the site of the injection, or have myasthenia gravis, Lambert-Eaton syndrome, or ALS.  It is also possible that as with any injection, there may be an allergic reaction or no effect from the medication. Reduced effectiveness after repeated injections is sometimes seen and rarely infection at the injection site may occur. All care will be taken to prevent these side effects. If therapy is given over a long time, atrophy and wasting in the muscle injected may occur. Occasionally the patient's become refractory to treatment because they develop antibodies to the toxin. In this event, therapy needs to be modified.  I have read the above information and consent to the administration of botulism toxin.    BOTOX PROCEDURE NOTE FOR MIGRAINE HEADACHE    Contraindications and precautions discussed with patient(above). Aseptic procedure was observed and patient tolerated procedure. Procedure performed by Dr. Georgia Dom  The condition has existed for more than 6 months, and pt does not have a diagnosis of ALS, Myasthenia Gravis or Lambert-Eaton Syndrome.  Risks and benefits of injections discussed and pt agrees to proceed with the procedure.  Written consent obtained  These injections are medically necessary. Pt  receives good  benefits from these injections. These injections do not cause sedations or hallucinations which the oral therapies may cause.  Description of procedure:  The patient was placed in a sitting position. The standard protocol was used for Botox as follows, with 5 units of Botox injected at each site:   -Procerus muscle, midline injection  -Corrugator muscle, bilateral injection  -Frontalis muscle, bilateral injection, with 2 sites each side, medial injection was performed in the upper one third of the frontalis muscle, in the region vertical from the medial inferior edge of the superior orbital rim. The lateral injection was again in the upper one third of the forehead vertically above the lateral limbus of the cornea, 1.5 cm lateral to the medial injection site.  -Temporalis muscle injection, 4 sites, bilaterally. The first injection was 3 cm above the tragus of the ear, second injection site was 1.5 cm to 3 cm up from the first injection site in line with the tragus of the ear. The third injection site was 1.5-3 cm forward between the first 2 injection sites. The fourth injection site was 1.5 cm posterior to the second injection site.   -Occipitalis muscle injection, 3 sites, bilaterally. The first injection was done one half way between the occipital protuberance and the tip of the mastoid process behind the ear. The second injection site was done lateral  and superior to the first, 1 fingerbreadth from the first injection. The third injection site was 1 fingerbreadth superiorly and medially from the first injection site.  -Cervical paraspinal muscle injection, 2 sites, bilateral knee first injection site was 1 cm from the midline of the cervical spine, 3 cm inferior to the lower border of the occipital protuberance. The second injection site was 1.5 cm superiorly and laterally to the first injection site.  -Trapezius muscle injection was performed at 3 sites, bilaterally. The first injection site  was in the upper trapezius muscle halfway between the inflection point of the neck, and the acromion. The second injection site was one half way between the acromion and the first injection site. The third injection was done between the first injection site and the inflection point of the neck.   Will return for repeat injection in 3 months.   200 units of Botox was used, any Botox not injected was wasted. The patient tolerated the procedure well, there were no complications of the above procedure.

## 2020-02-12 ENCOUNTER — Encounter: Payer: Self-pay | Admitting: Neurology

## 2020-02-12 ENCOUNTER — Telehealth: Payer: Self-pay | Admitting: Neurology

## 2020-02-12 ENCOUNTER — Ambulatory Visit (INDEPENDENT_AMBULATORY_CARE_PROVIDER_SITE_OTHER): Payer: Medicare Other | Admitting: Neurology

## 2020-02-12 DIAGNOSIS — G4733 Obstructive sleep apnea (adult) (pediatric): Secondary | ICD-10-CM

## 2020-02-12 DIAGNOSIS — G43711 Chronic migraine without aura, intractable, with status migrainosus: Secondary | ICD-10-CM | POA: Diagnosis not present

## 2020-02-12 DIAGNOSIS — G8929 Other chronic pain: Secondary | ICD-10-CM | POA: Diagnosis not present

## 2020-02-12 DIAGNOSIS — M4722 Other spondylosis with radiculopathy, cervical region: Secondary | ICD-10-CM | POA: Diagnosis not present

## 2020-02-12 DIAGNOSIS — M542 Cervicalgia: Secondary | ICD-10-CM | POA: Diagnosis not present

## 2020-02-12 DIAGNOSIS — R0683 Snoring: Secondary | ICD-10-CM | POA: Diagnosis not present

## 2020-02-12 DIAGNOSIS — M5412 Radiculopathy, cervical region: Secondary | ICD-10-CM | POA: Diagnosis not present

## 2020-02-12 MED ORDER — EMGALITY 120 MG/ML ~~LOC~~ SOAJ: 120.0000 mg | SUBCUTANEOUS | 0 refills | Status: DC

## 2020-02-12 NOTE — Patient Instructions (Signed)
MRI cervical spine Sleep team will contact you Dr. Mina Marble referral for beck interventions Start Emgality  Galcanezumab injection What is this medicine? GALCANEZUMAB (gal ka NEZ ue mab) is used to prevent migraines and treat cluster headaches. This medicine may be used for other purposes; ask your health care provider or pharmacist if you have questions. COMMON BRAND NAME(S): Emgality What should I tell my health care provider before I take this medicine? They need to know if you have any of these conditions:  an unusual or allergic reaction to galcanezumab, other medicines, foods, dyes, or preservatives  pregnant or trying to get pregnant  breast-feeding How should I use this medicine? This medicine is for injection under the skin. You will be taught how to prepare and give this medicine. Use exactly as directed. Take your medicine at regular intervals. Do not take your medicine more often than directed. It is important that you put your used needles and syringes in a special sharps container. Do not put them in a trash can. If you do not have a sharps container, call your pharmacist or healthcare provider to get one. Talk to your pediatrician regarding the use of this medicine in children. Special care may be needed. Overdosage: If you think you have taken too much of this medicine contact a poison control center or emergency room at once. NOTE: This medicine is only for you. Do not share this medicine with others. What if I miss a dose? If you miss a dose, take it as soon as you can. If it is almost time for your next dose, take only that dose. Do not take double or extra doses. What may interact with this medicine? Interactions are not expected. This list may not describe all possible interactions. Give your health care provider a list of all the medicines, herbs, non-prescription drugs, or dietary supplements you use. Also tell them if you smoke, drink alcohol, or use illegal drugs.  Some items may interact with your medicine. What should I watch for while using this medicine? Tell your doctor or healthcare professional if your symptoms do not start to get better or if they get worse. What side effects may I notice from receiving this medicine? Side effects that you should report to your doctor or health care professional as soon as possible:  allergic reactions like skin rash, itching or hives, swelling of the face, lips, or tongue Side effects that usually do not require medical attention (report these to your doctor or health care professional if they continue or are bothersome):  pain, redness, or irritation at site where injected This list may not describe all possible side effects. Call your doctor for medical advice about side effects. You may report side effects to FDA at 1-800-FDA-1088. Where should I keep my medicine? Keep out of the reach of children. You will be instructed on how to store this medicine. Throw away any unused medicine after the expiration date on the label. NOTE: This sheet is a summary. It may not cover all possible information. If you have questions about this medicine, talk to your doctor, pharmacist, or health care provider.  2020 Elsevier/Gold Standard (2017-10-25 12:03:23)

## 2020-02-12 NOTE — Telephone Encounter (Signed)
Medicare/[employer]et[employer] order sent to [facility]. No auth they will reach out to the patient to schedule.

## 2020-02-12 NOTE — Progress Notes (Signed)
Botox- 200 units x 1 vial Lot: [id number] Expiration: 09/2022 NDC: 2787-1836-72  Bacteriostatic 0.9% Sodium Chloride- 29mL total Lot: [id number] Expiration: 02/20/2021 NDC: 4290-3795-58  Dx: P16.742 B/B

## 2020-02-18 ENCOUNTER — Ambulatory Visit
Admission: RE | Admit: 2020-02-18 | Discharge: 2020-02-18 | Disposition: A | Discharge status: Home / Self Care | Payer: Medicare Other | Source: Ambulatory Visit | Attending: Neurology | Admitting: Neurology

## 2020-02-18 ENCOUNTER — Other Ambulatory Visit: Payer: Self-pay

## 2020-02-18 DIAGNOSIS — M4722 Other spondylosis with radiculopathy, cervical region: Secondary | ICD-10-CM

## 2020-02-18 DIAGNOSIS — M5412 Radiculopathy, cervical region: Secondary | ICD-10-CM | POA: Diagnosis not present

## 2020-02-18 DIAGNOSIS — G8929 Other chronic pain: Secondary | ICD-10-CM

## 2020-02-18 DIAGNOSIS — G43711 Chronic migraine without aura, intractable, with status migrainosus: Secondary | ICD-10-CM | POA: Diagnosis not present

## 2020-02-18 DIAGNOSIS — M542 Cervicalgia: Secondary | ICD-10-CM | POA: Diagnosis not present

## 2020-02-20 DIAGNOSIS — M0579 Rheumatoid arthritis with rheumatoid factor of multiple sites without organ or systems involvement: Secondary | ICD-10-CM | POA: Diagnosis not present

## 2020-02-20 DIAGNOSIS — M150 Primary generalized (osteo)arthritis: Secondary | ICD-10-CM | POA: Diagnosis not present

## 2020-02-20 DIAGNOSIS — R5382 Chronic fatigue, unspecified: Secondary | ICD-10-CM | POA: Diagnosis not present

## 2020-02-20 DIAGNOSIS — M2550 Pain in unspecified joint: Secondary | ICD-10-CM | POA: Diagnosis not present

## 2020-02-20 DIAGNOSIS — E663 Overweight: Secondary | ICD-10-CM | POA: Diagnosis not present

## 2020-02-20 DIAGNOSIS — Z6829 Body mass index (BMI) 29.0-29.9, adult: Secondary | ICD-10-CM | POA: Diagnosis not present

## 2020-02-24 ENCOUNTER — Telehealth: Payer: Self-pay | Admitting: *Deleted

## 2020-02-24 NOTE — Telephone Encounter (Signed)
I called the pt and LVM asking for call back.  

## 2020-02-24 NOTE — Telephone Encounter (Signed)
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 There has been very little progression since 2016. This can be causing her neck discomfort but there is nothing very serious as far as surgical needs go and looks like nothing has changed much in 5 years. Good news. Thanks

## 2020-02-25 ENCOUNTER — Encounter: Payer: Self-pay | Admitting: *Deleted

## 2020-02-25 NOTE — Telephone Encounter (Signed)
Error

## 2020-02-25 NOTE — Telephone Encounter (Deleted)
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 There has been very little progression since 2016. This can be causing her neck discomfort but there is nothing very serious as far as surgical needs go and looks like nothing has changed much in 5 years. Good news. Thanks

## 2020-02-25 NOTE — Telephone Encounter (Signed)
Spoke with the patient and discussed results of MRI cervical spine per Dr Jaynee Eagles. Pt verbalized understanding. She requested to have the results sent over to Dr. Normajean Glasgow with [facility].

## 2020-02-26 NOTE — Telephone Encounter (Signed)
Done

## 2020-03-03 DIAGNOSIS — M0579 Rheumatoid arthritis with rheumatoid factor of multiple sites without organ or systems involvement: Secondary | ICD-10-CM | POA: Diagnosis not present

## 2020-03-11 ENCOUNTER — Other Ambulatory Visit: Payer: Self-pay

## 2020-03-11 ENCOUNTER — Ambulatory Visit (HOSPITAL_COMMUNITY)
Admission: EM | Admit: 2020-03-11 | Discharge: 2020-03-11 | Disposition: A | Discharge status: Home / Self Care | Payer: Medicare Other | Attending: Family Medicine | Admitting: Family Medicine

## 2020-03-11 ENCOUNTER — Encounter (HOSPITAL_COMMUNITY): Payer: Self-pay | Admitting: Emergency Medicine

## 2020-03-11 DIAGNOSIS — R079 Chest pain, unspecified: Secondary | ICD-10-CM | POA: Diagnosis not present

## 2020-03-11 NOTE — ED Triage Notes (Signed)
Patient c/o LFT sided chest pain started last night around 2000.   Patient stated onset of pain began while sitting on chair watching TV at home.   Patient has history of Chest pain w/ diagnosis "related to medication" per patient statement.   Patient started new rheumatoid arthritis infusion this week.

## 2020-03-11 NOTE — Discharge Instructions (Signed)
You have been seen at the [facility] today for chest pain. Your evaluation today was not suggestive of any emergent condition requiring medical intervention at this time. Your ECG (heart tracing) did not show any worrisome changes. However, some medical problems make take more time to appear. Therefore, it's very important that you pay attention to any new symptoms or worsening of your current condition.  Please proceed directly to the Emergency Department immediately should you feel worse in any way or have any of the following symptoms: increasing or different chest pain, pain that spreads to your arm, neck, jaw, back or abdomen, shortness of breath, or nausea and vomiting.  

## 2020-03-14 NOTE — ED Provider Notes (Signed)
[facility]   [id number] 03/11/20 Arrival Time: 5397  ASSESSMENT & PLAN:  1. Chest pain, unspecified type     With similar symptoms in the past. Elects against ED evaluation. Comfortable with home observation.  ECG: Performed today and interpreted by me: sinus arrhythmia. No STEMI.    Discharge Instructions     You have been seen at the [facility] today for chest pain. Your evaluation today was not suggestive of any emergent condition requiring medical intervention at this time. Your ECG (heart tracing) did not show any worrisome changes. However, some medical problems make take more time to appear. Therefore, it's very important that you pay attention to any new symptoms or worsening of your current condition.  Please proceed directly to the Emergency Department immediately should you feel worse in any way or have any of the following symptoms: increasing or different chest pain, pain that spreads to your arm, neck, jaw, back or abdomen, shortness of breath, or nausea and vomiting.      Chest pain precautions given. Reviewed expectations re: course of current medical issues. Questions answered. Outlined signs and symptoms indicating need for more acute intervention. Patient verbalized understanding. After Visit Summary given.   SUBJECTIVE:  History from: patient. Terry Shannon is a 64 y.o. female who presents with complaint of intermittent left sided chest pain; gradual onset around 2000 yest evening. Slept through the night without issue. Noted discomfort upon waking. No associated n/v/SOB. H/O similar chest pain. No specific GERD symptoms. No abdominal or back pain. Ambulatory without difficulty. Did begin a rheumatoid arthritis infusion this week; questions if relation.  Social History   Tobacco Use  Smoking Status Never Smoker  Smokeless Tobacco Never Used   Social History   Substance and Sexual Activity  Alcohol Use Yes   Comment: 2-3  drinks/week      OBJECTIVE:  Vitals:   03/11/20 1413 03/11/20 1414  BP: (!) 147/90   Pulse: (!) 50   Resp: 16   Temp:  97.9 F (36.6 C)  SpO2: 98%     General appearance: alert, oriented, no acute distress Eyes: PERRLA; EOMI; conjunctivae normal HENT: normocephalic; atraumatic Neck: supple with FROM Lungs: without labored respirations; speaks full sentences without difficulty; CTAB Heart: sounds regular without murmer Chest Wall: without significant tenderness to palpation Abdomen: soft, non-tender; no guarding or rebound tenderness Extremities: without edema; without calf swelling or tenderness; symmetrical without gross deformities Skin: warm and dry; without rash or lesions Neuro: normal gait Psychological: alert and cooperative; normal mood and affect  Allergies  Allergen Reactions  . Codeine Nausea And Vomiting  . Hydrocodone Nausea And Vomiting  . Ibuprofen Other (See Comments)    MIGRAINES  . Iodinated Diagnostic Agents Hives and Itching  . Methotrexate Derivatives Other (See Comments)    GI UPSET  . Oxycodone Nausea And Vomiting  . Tylenol [Acetaminophen] Other (See Comments)    MIGRAINES  . Aleve [Naproxen Sodium]   . Sulfa Antibiotics   . Neosporin [Neomycin-Bacitracin Zn-Polymyx] Swelling    AT SITE    Past Medical History:  Diagnosis Date  . Allergy    year-round, pt. states  . Chronic lower back pain   . 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 Complication of anesthesia    slow to wake up after gallbladder surgery  . Depression   . Eczema    ARMS AND HANDS  . GERD (gastroesophageal reflux disease)   . History of thyroid cancer   .  Hypothyroidism   . Migraines   . Osteoarthritis   . Overactive bladder   . RA (rheumatoid arthritis) ([place])   . Sleep apnea    no CPAP use  . Squamous cell carcinoma of skin 12/21/2017   in situ-mid chest (CX35FU)  . Ulcerative colitis ([place])    Social History   Socioeconomic History  .  Marital status: Significant Other    Spouse name: Not on file  . Number of children: 0  . Years of education: Not on file  . Highest education level: Bachelor's degree (e.g., BA, AB, BS)  Occupational History  . Not on file  Tobacco Use  . Smoking status: Never Smoker  . Smokeless tobacco: Never Used  Vaping Use  . Vaping Use: Never used  Substance and Sexual Activity  . Alcohol use: Yes    Comment: 2-3 drinks/week  . Drug use: No  . Sexual activity: Not Currently    Birth control/protection: None  Other Topics Concern  . Not on file  Social History Narrative   Lives at home with her partner and two daughters   Right handed   Caffeine; 1 cup/daily.     Social Determinants of Health   Financial Resource Strain:   . Difficulty of Paying Living Expenses: Not on file  Food Insecurity:   . Worried About Charity fundraiser in the Last Year: Not on file  . Ran Out of Food in the Last Year: Not on file  Transportation Needs:   . Lack of Transportation (Medical): Not on file  . Lack of Transportation (Non-Medical): Not on file  Physical Activity:   . Days of Exercise per Week: Not on file  . Minutes of Exercise per Session: Not on file  Stress:   . Feeling of Stress : Not on file  Social Connections:   . Frequency of Communication with Friends and Family: Not on file  . Frequency of Social Gatherings with Friends and Family: Not on file  . Attends Religious Services: Not on file  . Active Member of Clubs or Organizations: Not on file  . Attends Archivist Meetings: Not on file  . Marital Status: Not on file  Intimate Partner Violence:   . Fear of Current or Ex-Partner: Not on file  . Emotionally Abused: Not on file  . Physically Abused: Not on file  . Sexually Abused: Not on file   Family History  Problem Relation Age of Onset  . Stroke Mother   . Cancer Father 71       lung  . Diabetes Brother   . Barrett's esophagus Brother   . Cancer Maternal  Grandmother 15       colon  . Stroke Maternal Grandmother   . Stroke Maternal Grandfather   . Diabetes Paternal Grandmother    Past Surgical History:  Procedure Laterality Date  . ABDOMINAL HERNIA REPAIR  70/17/7939   periumbilical ventral hernia/incisional hernia  . BUNIONECTOMY Right    x 2 more  . BUNIONECTOMY Left   . BUNIONECTOMY WITH CHILECTOMY Right 12/16/2005  . CARPAL TUNNEL RELEASE Right 07/13/2001  . CARPAL TUNNEL RELEASE Left 08/10/2001  . DILATION AND CURETTAGE OF UTERUS    . HAMMER TOE SURGERY Left may 2014  . HYSTEROSCOPY WITH D & C  03/01/2004   with exc. of endometrial polyp  . KNEE ARTHROSCOPY Right 2013  . LAPAROSCOPIC CHOLECYSTECTOMY  11/03/2006  . LUMBAR FUSION  03/06/2014   l4  l5   .  nerve ablation lumbar     . THYROIDECTOMY, PARTIAL Right prior to 2002  . THYROIDECTOMY, PARTIAL Left 11/29/2000   and isthmus  . TONSILLECTOMY  1961  . TOTAL ANKLE REPLACEMENT Left 11/2016   with tendon repair  . TOTAL KNEE ARTHROPLASTY Right 04/01/2013   Procedure: RIGHT TOTAL KNEE ARTHROPLASTY;  Surgeon: Alta Corning, MD;  Location: [facility];  Service: Orthopedics;  Laterality: Right;     Vanessa Kick, MD 03/14/20 [phone]

## 2020-03-19 DIAGNOSIS — M47816 Spondylosis without myelopathy or radiculopathy, lumbar region: Secondary | ICD-10-CM | POA: Diagnosis not present

## 2020-03-23 DIAGNOSIS — M1712 Unilateral primary osteoarthritis, left knee: Secondary | ICD-10-CM | POA: Diagnosis not present

## 2020-03-23 DIAGNOSIS — M25462 Effusion, left knee: Secondary | ICD-10-CM | POA: Diagnosis not present

## 2020-03-23 DIAGNOSIS — M25562 Pain in left knee: Secondary | ICD-10-CM | POA: Diagnosis not present

## 2020-03-24 ENCOUNTER — Ambulatory Visit (INDEPENDENT_AMBULATORY_CARE_PROVIDER_SITE_OTHER): Payer: Medicare Other | Admitting: Neurology

## 2020-03-24 ENCOUNTER — Other Ambulatory Visit: Payer: Self-pay

## 2020-03-24 ENCOUNTER — Encounter: Payer: Self-pay | Admitting: Neurology

## 2020-03-24 VITALS — BP 147/85 | HR 62 | Ht 66.0 in | Wt 183.0 lb

## 2020-03-24 DIAGNOSIS — G43711 Chronic migraine without aura, intractable, with status migrainosus: Secondary | ICD-10-CM

## 2020-03-24 DIAGNOSIS — R0689 Other abnormalities of breathing: Secondary | ICD-10-CM

## 2020-03-24 DIAGNOSIS — G4701 Insomnia due to medical condition: Secondary | ICD-10-CM | POA: Diagnosis not present

## 2020-03-24 DIAGNOSIS — Z8669 Personal history of other diseases of the nervous system and sense organs: Secondary | ICD-10-CM | POA: Diagnosis not present

## 2020-03-24 DIAGNOSIS — G8929 Other chronic pain: Secondary | ICD-10-CM

## 2020-03-24 NOTE — Patient Instructions (Signed)
Zolpidem tablets What is this medicine? ZOLPIDEM (zole PI dem) is used to treat insomnia. This medicine helps you to fall asleep and sleep through the night. This medicine may be used for other purposes; ask your health care provider or pharmacist if you have questions. COMMON BRAND NAME(S): Ambien What should I tell my health care provider before I take this medicine? They need to know if you have any of these conditions:  depression  history of drug abuse or addiction  if you often drink alcohol  liver disease  lung or breathing disease  myasthenia gravis  sleep apnea  sleep-walking, driving, eating or other activity while not fully awake after taking a sleep medicine  suicidal thoughts, plans, or attempt; a previous suicide attempt by you or a family member  an unusual or allergic reaction to zolpidem, other medicines, foods, dyes, or preservatives  pregnant or trying to get pregnant  breast-feeding How should I use this medicine? Take this medicine by mouth with a glass of water. Follow the directions on the prescription label. It is better to take this medicine on an empty stomach and only when you are ready for bed. Do not take your medicine more often than directed. If you have been taking this medicine for several weeks and suddenly stop taking it, you may get unpleasant withdrawal symptoms. Your doctor or health care professional may want to gradually reduce the dose. Do not stop taking this medicine on your own. Always follow your doctor or health care professional's advice. A special MedGuide will be given to you by the pharmacist with each prescription and refill. Be sure to read this information carefully each time. Talk to your pediatrician regarding the use of this medicine in children. Special care may be needed. Overdosage: If you think you have taken too much of this medicine contact a poison control center or emergency room at once. NOTE: This medicine is only  for you. Do not share this medicine with others. What if I miss a dose? This does not apply. This medicine should only be taken immediately before going to sleep. Do not take double or extra doses. What may interact with this medicine?  alcohol  antihistamines for allergy, cough and cold  certain medicines for anxiety or sleep  certain medicines for depression, like amitriptyline, fluoxetine, sertraline  certain medicines for fungal infections like ketoconazole and itraconazole  certain medicines for seizures like phenobarbital, primidone  ciprofloxacin  dietary supplements for sleep, like valerian or kava kava  general anesthetics like halothane, isoflurane, methoxyflurane, propofol  local anesthetics like lidocaine, pramoxine, tetracaine  medicines that relax muscles for surgery  narcotic medicines for pain  phenothiazines like chlorpromazine, mesoridazine, prochlorperazine, thioridazine  rifampin This list may not describe all possible interactions. Give your health care provider a list of all the medicines, herbs, non-prescription drugs, or dietary supplements you use. Also tell them if you smoke, drink alcohol, or use illegal drugs. Some items may interact with your medicine. What should I watch for while using this medicine? Visit your doctor or health care professional for regular checks on your progress. Keep a regular sleep schedule by going to bed at about the same time each night. Avoid caffeine-containing drinks in the evening hours. When sleep medicines are used every night for more than a few weeks, they may stop working. Talk to your doctor if you still have trouble sleeping. After taking this medicine, you may get up out of bed and do an activity that you   do not know you are doing. The next morning, you may have no memory of this. Activities include driving a car ("sleep-driving"), making and eating food, talking on the phone, sexual activity, and sleep-walking.  Serious injuries have occurred. Stop the medicine and call your doctor right away if you find out you have done any of these activities. Do not take this medicine if you have used alcohol that evening. Do not take it if you have taken another medicine for sleep. The risk of doing these sleep-related activities is higher. Wait for at least 8 hours after you take a dose before driving or doing other activities that require full mental alertness. Do not take this medicine unless you are able to stay in bed for a full night (7 to 8 hours) before you must be active again. You may have a decrease in mental alertness the day after use, even if you feel that you are fully awake. Tell your doctor if you will need to perform activities requiring full alertness, such as driving, the next day. Do not stand or sit up quickly after taking this medicine, especially if you are an older patient. This reduces the risk of dizzy or fainting spells. If you or your family notice any changes in your behavior, such as new or worsening depression, thoughts of harming yourself, anxiety, other unusual or disturbing thoughts, or memory loss, call your doctor right away. After you stop taking this medicine, you may have trouble falling asleep. This is called rebound insomnia. This problem usually goes away on its own after 1 or 2 nights. What side effects may I notice from receiving this medicine? Side effects that you should report to your doctor or health care professional as soon as possible:  allergic reactions like skin rash, itching or hives, swelling of the face, lips, or tongue  breathing problems  changes in vision  confusion  depressed mood or other changes in moods or emotions  feeling faint or lightheaded, falls  hallucinations  loss of balance or coordination  loss of memory  numbness or tingling of the tongue  restlessness, excitability, or feelings of anxiety or agitation  signs and symptoms of liver  injury like dark yellow or brown urine; general ill feeling or flu-like symptoms; light-colored stools; loss of appetite; nausea; right upper belly pain; unusually weak or tired; yellowing of the eyes or skin  suicidal thoughts  unusual activities while not fully awake like driving, eating, making phone calls, or sexual activity Side effects that usually do not require medical attention (report to your doctor or health care professional if they continue or are bothersome):  dizziness  drowsiness the day after you take this medicine  headache This list may not describe all possible side effects. Call your doctor for medical advice about side effects. You may report side effects to FDA at 1-800-FDA-1088. Where should I keep my medicine? Keep out of the reach of children. This medicine can be abused. Keep your medicine in a safe place to protect it from theft. Do not share this medicine with anyone. Selling or giving away this medicine is dangerous and against the law. This medicine may cause accidental overdose and death if taken by other adults, children, or pets. Mix any unused medicine with a substance like cat litter or coffee grounds. Then throw the medicine away in a sealed container like a sealed bag or a coffee can with a lid. Do not use the medicine after the expiration date. Store at   room temperature between 20 and 25 degrees C (68 and 77 degrees F). NOTE: This sheet is a summary. It may not cover all possible information. If you have questions about this medicine, talk to your doctor, pharmacist, or health care provider.  2020 Elsevier/Gold Standard (2018-01-26 11:51:08) Quality Sleep Information, Adult Quality sleep is important for your mental and physical health. It also improves your quality of life. Quality sleep means you:  Are asleep for most of the time you are in bed.  Fall asleep within 30 minutes.  Wake up no more than once a night.  Are awake for no longer than 20  minutes if you do wake up during the night. Most adults need 7-8 hours of quality sleep each night. How can poor sleep affect me? If you do not get enough quality sleep, you may have:  Mood swings.  Daytime sleepiness.  Confusion.  Decreased reaction time.  Sleep disorders, such as insomnia and sleep apnea.  Difficulty with: ? Solving problems. ? Coping with stress. ? Paying attention. These issues may affect your performance and productivity at work, school, and at home. Lack of sleep may also put you at higher risk for accidents, suicide, and risky behaviors. If you do not get quality sleep you may also be at higher risk for several health problems, including:  Infections.  Type 2 diabetes.  Heart disease.  High blood pressure.  Obesity.  Worsening of long-term conditions, like arthritis, kidney disease, depression, Parkinson's disease, and epilepsy. What actions can I take to get more quality sleep?      Stick to a sleep schedule. Go to sleep and wake up at about the same time each day. Do not try to sleep less on weekdays and make up for lost sleep on weekends. This does not work.  Try to get about 30 minutes of exercise on most days. Do not exercise 2-3 hours before going to bed.  Limit naps during the day to 30 minutes or less.  Do not use any products that contain nicotine or tobacco, such as cigarettes or e-cigarettes. If you need help quitting, ask your health care provider.  Do not drink caffeinated beverages for at least 8 hours before going to bed. Coffee, tea, and some sodas contain caffeine.  Do not drink alcohol close to bedtime.  Do not eat large meals close to bedtime.  Do not take naps in the late afternoon.  Try to get at least 30 minutes of sunlight every day. Morning sunlight is best.  Make time to relax before bed. Reading, listening to music, or taking a hot bath promotes quality sleep.  Make your bedroom a place that promotes quality  sleep. Keep your bedroom dark, quiet, and at a comfortable room temperature. Make sure your bed is comfortable. Take out sleep distractions like TV, a computer, smartphone, and bright lights.  If you are lying awake in bed for longer than 20 minutes, get up and do a relaxing activity until you feel sleepy.  Work with your health care provider to treat medical conditions that may affect sleeping, such as: ? Nasal obstruction. ? Snoring. ? Sleep apnea and other sleep disorders.  Talk to your health care provider if you think any of your prescription medicines may cause you to have difficulty falling or staying asleep.  If you have sleep problems, talk with a sleep consultant. If you think you have a sleep disorder, talk with your health care provider about getting evaluated by a specialist.  Where to find more information  [employer] website: https://sleepfoundation.org  National Heart, [employer], [place] ([place]): http://www.saunders.info/.pdf  Centers for Disease Control and Prevention (CDC): LearningDermatology.pl Contact a health care provider if you:  Have trouble getting to sleep or staying asleep.  Often wake up very early in the morning and cannot get back to sleep.  Have daytime sleepiness.  Have daytime sleep attacks of suddenly falling asleep and sudden muscle weakness (narcolepsy).  Have a tingling sensation in your legs with a strong urge to move your legs (restless legs syndrome).  Stop breathing briefly during sleep (sleep apnea).  Think you have a sleep disorder or are taking a medicine that is affecting your quality of sleep. Summary  Most adults need 7-8 hours of quality sleep each night.  Getting enough quality sleep is an important part of health and well-being.  Make your bedroom a place that promotes quality sleep and avoid things that may cause you to have poor sleep, such as alcohol, caffeine,  smoking, and large meals.  Talk to your health care provider if you have trouble falling asleep or staying asleep. This information is not intended to replace advice given to you by your health care provider. Make sure you discuss any questions you have with your health care provider. Document Revised: 08/16/2017 Document Reviewed: 08/16/2017 Elsevier Patient Education  [place].

## 2020-03-24 NOTE — Progress Notes (Signed)
SLEEP MEDICINE CLINIC    Provider:  Larey Seat, MD  Primary Care Physician:  Willey Blade, [place] [address]     Referring Provider: Dr. Jaynee Eagles, MD        Chief Complaint according to patient   Patient presents with:    . New Patient (Initial Visit)     Has tried cpap in the past but could not keep it on,      HISTORY OF PRESENT ILLNESS:  Terry Shannon is a 64 y.o. year old White or Caucasian female patient seen here as a referral on 03/24/2020.    Chief concern according to patient : Chief complaint according to patient : " Most bothering as the correlation of migraines sometimes lasting 3 days be triggered by exercise, especially upper body exercise"    I remember Terry Shannon I have seen her 12 years ago I think I have seen her last about 6 years ago, and this was for migraine treatment. She retired about 4 years ago. 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 She has responded to Imitrex on and off but she states that it seems to not work long enough or at times would not at all. In addition she Flexeril to control neck pain. She has noticed a strong correlation between exercise and migraine onset usually she exercises in daytime comes home and the migraine sets on within 2 or 3 hours later. Many of these migraines the last more than a day. The Imitrex is no longer helping as much. Trigger points seem always left paraspinal, occipital and into the temporal area, radiating above the eye. The patient had noted that sometimes taking Flexeril before exercising seems to help the headaches not to come on. She used to get migraines associated with rather significant nausea, but this is no longer the case. She has not vomited because of migraines in many years. She sees occasionally floaters or flickers but she has not noticed this as a strong correlation to an onset of migraine / AURA. Over a  decade ago she was already testing dietary triggers and learnt that she cannot drink red wine, aspartame or other sweeteners can cause her to have a migraine. She not longer tolerate Beer, but used to.  She has migraine at least 4 -5 times a month, 2 a week.  She has chronic insomnia.  The patient remembers that she was placed on gabapentin after back surgery this however also eliminated the trigger of exercise and she felt "okay while she was taking gabapentin. She has been on Depakote about a decade ago and also on topiramate. None of these have ever eliminated her headaches. She is also looking for potential trigger point injections, infusion or Botox therapy.      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 Her migraines persisted while using CPAP-    She has a medical history of Rheumatoid arthritis- autoimmune- fatigue. 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  Chronic and frequent Migraines, Overactive bladder does not a cause nocturia or enuresis.  Untreated Sleep apnea, Squamous cell carcinoma of skin (12/21/2017), and Ulcerative colitis ([place]).  The patient had  the first sleep study in the year 2009 or earlier. I don't have the results. She continues to present at home there  Is Gasping/ Snoring/ Breath holding.  She takes Ambien daily/ nightly and sleeps 7 hours.    Sleep relevant medical history:    Family medical /sleep history: 2 brothers  with OSA, nobody with insomnia, mother had rheumatoid arthritis, too.  Ambien induced sleep walking/ eating.    Social history:  Patient is retired from Conservation officer, nature, activities and Marketing executive, and lives in a  household with her partner, her step daughter and husband have meanwhile moved out, another step daughter and her boyfriend moved out.  Family status is living in a same sex relationship. Pets are present. Tobacco use; never .   ETOH use ; 2 drinks a week. Caffeine intake in form of Coffee( 1 cup) no Soda,Tea,or energy drinks. Regular exercise in form of swimming before the pandemic, neck therapy.     Sleep habits are as follows: The patient's dinner time is between 6 and 6. 45   PM.  Another snack at round 9 PM,  The patient takes Ambien at midnight- goes to bed at 1 AM and continues to sleep for 6-7 hours, doesn't wake for  bathroom breaks.The preferred sleep position is both sides , with the support of one pillow.  Dreams are reportedly rare. 8-9 , rarely 10 AM is the usual rise time. The patient wakes up spontaneously.  She reports  feeling somewhat refreshed or restored in AM, with symptoms such as dry mouth- no morning headaches, and residual fatigue.  Naps are taken very infrequently, disturbing her  nocturnal sleep.    Review of Systems: Out of a complete 14 system review, the patient complains of only the following symptoms, and all other reviewed systems are negative.:  Fatigue, sleepiness , snoring,  Insomnia - ambien dependent.   Migraine at least 5 a month ,    How likely are you to doze in the following situations: 0 = not likely, 1 = slight chance, 2 = moderate chance, 3 = high chance   Sitting and Reading? Watching Television? Sitting inactive in a public place (theater or meeting)? As a passenger in a car for an hour without a break? Lying down in the afternoon when circumstances permit? Sitting and talking to someone? Sitting quietly after lunch without alcohol? In a car, while stopped for a few minutes in traffic?   Total = 3/ 24 points   FSS endorsed at 36/ 63 points.  GDS - depression score - controlled on Pristiq   Social History   Socioeconomic History  .  Marital status: Significant Other    Spouse name: Not on file  . Number of children: 0  . Years of education: Not on file  . Highest education level: Bachelor's degree (e.g., BA, AB, BS)  Occupational History  . Not on file  Tobacco Use  . Smoking status: Never Smoker  . Smokeless tobacco: Never Used  Vaping Use  . Vaping Use: Never used  Substance and Sexual Activity  . Alcohol use: Yes    Comment: 2-3 drinks/week  . Drug use: No  . Sexual activity: Not Currently    Birth control/protection: None  Other Topics Concern  . Not on file  Social History Narrative   Lives at home with her partner and two daughters   Right handed   Caffeine; 1 cup/daily.     Social Determinants of Health   Financial  Resource Strain:   . Difficulty of Paying Living Expenses: Not on file  Food Insecurity:   . Worried About Charity fundraiser in the Last Year: Not on file  . Ran Out of Food in the Last Year: Not on file  Transportation Needs:   . Lack of Transportation (Medical): Not on file  . Lack of Transportation (Non-Medical): Not on file  Physical Activity:   . Days of Exercise per Week: Not on file  . Minutes of Exercise per Session: Not on file  Stress:   . Feeling of Stress : Not on file  Social Connections:   . Frequency of Communication with Friends and Family: Not on file  . Frequency of Social Gatherings with Friends and Family: Not on file  . Attends Religious Services: Not on file  . Active Member of Clubs or Organizations: Not on file  . Attends Archivist Meetings: Not on file  . Marital Status: Not on file    Family History  Problem Relation Age of Onset  . Stroke Mother   . Cancer Father 72       lung  . Diabetes Brother   . Barrett's esophagus Brother   . Cancer Maternal Grandmother 17       colon  . Stroke Maternal Grandmother   . Stroke Maternal Grandfather   . Diabetes Paternal Grandmother     Past Medical History:  Diagnosis Date  . Allergy      year-round, pt. states  . Chronic lower back pain   . 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 Complication of anesthesia    slow to wake up after gallbladder surgery  . Depression   . Eczema    ARMS AND HANDS  . GERD (gastroesophageal reflux disease)   . History of thyroid cancer   . Hypothyroidism   . Migraines   . Osteoarthritis   . Overactive bladder   . RA (rheumatoid arthritis) ([place])   . Sleep apnea    no CPAP use  . Squamous cell carcinoma of skin 12/21/2017   in situ-mid chest (CX35FU)  . Ulcerative colitis [facility])     Past Surgical History:  Procedure Laterality Date  . ABDOMINAL HERNIA REPAIR  88/41/6606   periumbilical ventral hernia/incisional hernia  . BUNIONECTOMY Right    x 2 more  . BUNIONECTOMY Left   . BUNIONECTOMY WITH CHILECTOMY Right 12/16/2005  . CARPAL TUNNEL RELEASE Right 07/13/2001  . CARPAL TUNNEL RELEASE Left 08/10/2001  . DILATION AND CURETTAGE OF UTERUS    . HAMMER TOE SURGERY Left may 2014  . HYSTEROSCOPY WITH D & C  03/01/2004   with exc. of endometrial polyp  . KNEE ARTHROSCOPY Right 2013  . LAPAROSCOPIC CHOLECYSTECTOMY  11/03/2006  . LUMBAR FUSION  03/06/2014   l4  l5   . nerve ablation lumbar     . THYROIDECTOMY, PARTIAL Right prior to 2002  . THYROIDECTOMY, PARTIAL Left 11/29/2000   and isthmus  . TONSILLECTOMY  1961  . TOTAL ANKLE REPLACEMENT Left 11/2016   with tendon repair  . TOTAL KNEE ARTHROPLASTY Right 04/01/2013   Procedure: RIGHT TOTAL KNEE ARTHROPLASTY;  Surgeon: Alta Corning, MD;  Location: [place];  Service: Orthopedics;  Laterality: Right;     Current Outpatient Medications on File Prior to Visit  Medication Sig Dispense Refill  . ALPRAZolam (XANAX) 0.5 MG tablet Take 1 tablet (0.5 mg total) by mouth 3 (three) times daily as needed for anxiety.  30 tablet 2  . Ascorbic Acid (VITAMIN C PO) Take 6,000 mg by mouth daily.    Marland Kitchen aspirin EC 325 MG tablet Take 325 mg by mouth 2 (two) times daily as needed for mild  pain.     . B Complex-C (SUPER B COMPLEX PO) Take 1 tablet by mouth daily.    . Cholecalciferol (VITAMIN D3 PO) Take 1 tablet by mouth daily.     . Coenzyme Q10 (COQ-10 PO) Take 10 mLs by mouth daily.    . cyclobenzaprine (FLEXERIL) 10 MG tablet Take 10 mg by mouth 3 (three) times daily as needed for muscle spasms.    Marland Kitchen desvenlafaxine (PRISTIQ) 100 MG 24 hr tablet Take 100 mg by mouth daily.     Marland Kitchen etanercept (ENBREL) 50 MG/ML injection Inject 50 mg into the skin once a week.    Marland Kitchen Fexofenadine HCl (ALLEGRA PO) Take by mouth.    . Flax OIL Take by mouth.    . gabapentin (NEURONTIN) 300 MG capsule Take 300 mg by mouth 4 (four) times daily.     . Galcanezumab-gnlm (EMGALITY) 120 MG/ML SOAJ Inject 120 mg into the skin every 30 (thirty) days. Next injection 10/22 2 mL 0  . levothyroxine (SYNTHROID) 50 MCG tablet Take 50 mcg by mouth daily before breakfast.    . mesalamine (LIALDA) 1.2 G EC tablet Take 2.4 g by mouth 2 (two) times daily.     . metoCLOPramide (REGLAN) 10 MG tablet Take 1 tablet (10 mg total) by mouth every 8 (eight) hours as needed. Take to treat migraines and/or nausea. 20 tablet 12  . Omega-3 Fatty Acids (OMEGA 3 PO) Take 1,200 mg by mouth daily.     Marland Kitchen OVER THE COUNTER MEDICATION Organic elderberry    . oxybutynin (DITROPAN-XL) 10 MG 24 hr tablet Take 10 mg by mouth daily.    . pantoprazole (PROTONIX) 40 MG tablet Take 40 mg by mouth.    . Probiotic Product (PROBIOTIC DAILY PO) Take 1 tablet by mouth daily.     . propranolol ER (INDERAL LA) 80 MG 24 hr capsule Take 1 capsule (80 mg total) by mouth at bedtime. For migraine prevention and high blood pressure. 90 capsule 4  . RESTASIS MULTIDOSE 0.05 % ophthalmic emulsion     . Rosuvastatin Calcium (CRESTOR PO) Take 20 mg by mouth daily.     . SUMAtriptan (IMITREX) 100 MG tablet Take 1 tablet (100 mg total) by mouth once as needed for up to 1 dose for migraine. May repeat in 2 hours if headache persists or recurs. 10 tablet 12  .  thyroid (ARMOUR) 60 MG tablet Take 60 mg by mouth daily before breakfast.    . zolpidem (AMBIEN CR) 12.5 MG CR tablet Take 12.5 mg by mouth at bedtime.    Marland Kitchen zonisamide (ZONEGRAN) 100 MG capsule Take 1 capsule (100 mg total) by mouth daily. For migraine prevention. 90 capsule 4   No current facility-administered medications on file prior to visit.    Allergies  Allergen Reactions  . Codeine Nausea And Vomiting  . Hydrocodone Nausea And Vomiting  . Ibuprofen Other (See Comments)    MIGRAINES  . Iodinated Diagnostic Agents Hives and Itching  . Methotrexate Derivatives Other (See Comments)    GI UPSET  . Oxycodone Nausea And Vomiting  . Tylenol [Acetaminophen] Other (See Comments)    MIGRAINES  . Aleve [Naproxen Sodium]   . Sulfa Antibiotics   . Neosporin [Neomycin-Bacitracin Zn-Polymyx] Swelling  AT SITE    Physical exam:  EQAST'M HDQQIW   03/24/20 1257  BP: (!) 147/85  Pulse: 62  Weight: 183 lb (83 kg)  Height: 5\' 6"  (1.676 m)   Body mass index is 29.54 kg/m.   Wt Readings from Last 3 Encounters:  03/24/20 183 lb (83 kg)  07/01/19 178 lb (80.7 kg)  10/10/18 172 lb (78 kg)     Ht Readings from Last 3 Encounters:  03/24/20 5\' 6"  (1.676 m)  07/01/19 5\' 6"  (1.676 m)  10/10/18 5' 5.5" (1.664 m)      General: The patient is awake, alert and appears not in acute distress. The patient is well groomed. Head: Normocephalic, atraumatic. Neck is not supple.  Mallampati 1,  neck circumference:14.5  inches .  Nasal airflow patent.  Retrognathia is not seen.  TMJ click and jaw pain.  Dental status: intact  Cardiovascular:  Regular rate and cardiac rhythm by pulse,  without distended neck veins. Respiratory: Lungs are clear to auscultation.  Skin:  Without evidence of ankle edema, or rash. Trunk: The patient's posture is stooped, her shoulders are droopy.    Neurologic exam : The patient is awake and alert, oriented to place and time.   Memory subjective described as  intact.  Attention span & concentration ability appears normal.  Speech is fluent,  without  dysarthria, dysphonia or aphasia.  Mood and affect are appropriate.   Cranial nerves: no loss of smell or taste reported  Pupils are equal and briskly reactive to light. Funduscopic exam deferred. .  Extraocular movements in vertical and horizontal planes were intact and without nystagmus. No Diplopia. Visual fields by finger perimetry are intact. Hearing was imapired.    Facial sensation intact to fine touch. Facial motor strength is symmetric and tongue and uvula move midline.  Neck ROM : rotation, tilt and flexion extension were normal for age and shoulder shrug was symmetrical.    Motor exam:  Symmetric bulk, tone and ROM.   Normal tone without cog -wheeling, symmetric grip strength .   Sensory:  Fine touch, pinprick and vibration were tested,  Vibration reduced in feet and ankles - upper extremities and hands were normal.  Proprioception tested in the upper extremities was normal.   Coordination: Rapid alternating movements in the fingers/hands were of normal speed.  She reports changes I handwriting , difficulties to hold the pen- rheumatic joints.  The Finger-to-nose maneuver was intact without evidence of ataxia, dysmetria or tremor.   Gait and station: Patient could rise unassisted from a seated position, walked without assistive device.  Stance is of normal width/ base and the patient turned with steps.  Toe and heel walk were deferred.  Deep tendon reflexes: in the  upper and lower extremities are symmetric and intact.  Babinski response was deferred .       After spending a total time of 45 minutes face to face and additional time for physical and neurologic examination, review of laboratory studies,  personal review of imaging studies, reports and results of other testing and review of referral information / records as far as provided in visit, I have established the following  assessments:  1)  Patient with predominantly fatigue, but not sleepiness.  Apnea is still witnessed by her partner / companion in life. Will retest.  2) Migraines are present for years - but not waking her or being present when she wakes.  3) multiple autoimmune disorders, chronic pain- all contributing to fatigue and insomnia.  My Plan is to proceed with:  1) HST - if REM dependent apnea is found we may try CPAP , if not we may go to the INSPIRE device/ dental device caveat with TMJ.   I would like to thank Dr Jaynee Eagles  for allowing me to meet with and to take care of this pleasant patient.   In short, Terry Shannon is presenting with insomnia and fatigue, chronic , a symptom that may be attributed to untreated apnea.   I plan to follow up either personally or through our NP within 3-4 month.  .  Electronically signed by: Larey Seat, MD 03/24/2020 1:16 PM  [facility] and [facility] Sleep Board certified by The [employer] of Sleep Medicine and Diplomate of the [employer] of Sleep Medicine. Board certified In Neurology through the [place], Fellow of the [employer] of Neurology. Medical Director of [employer].

## 2020-03-31 DIAGNOSIS — M0579 Rheumatoid arthritis with rheumatoid factor of multiple sites without organ or systems involvement: Secondary | ICD-10-CM | POA: Diagnosis not present

## 2020-04-06 ENCOUNTER — Telehealth: Payer: Self-pay

## 2020-04-06 ENCOUNTER — Other Ambulatory Visit: Payer: Self-pay | Admitting: Neurology

## 2020-04-06 DIAGNOSIS — G4733 Obstructive sleep apnea (adult) (pediatric): Secondary | ICD-10-CM

## 2020-04-06 DIAGNOSIS — R0683 Snoring: Secondary | ICD-10-CM

## 2020-04-06 DIAGNOSIS — Z8669 Personal history of other diseases of the nervous system and sense organs: Secondary | ICD-10-CM

## 2020-04-06 DIAGNOSIS — G8929 Other chronic pain: Secondary | ICD-10-CM

## 2020-04-06 DIAGNOSIS — G4701 Insomnia due to medical condition: Secondary | ICD-10-CM

## 2020-04-06 DIAGNOSIS — R0689 Other abnormalities of breathing: Secondary | ICD-10-CM

## 2020-04-06 NOTE — Telephone Encounter (Signed)
LVM for pt to call me back to schedule sleep study  

## 2020-04-06 NOTE — Telephone Encounter (Signed)
Medicare will not allow for HST if patient is wanting to start PAP again. Pt had CPAP(pt was unable to tolerate). Medicare requires that if a patient fails CPAP at home(or unable to tolerate CPAP at home) and wants to restart again, patient has to do an inlab sleep study to qualify. I need an order for inlab sleep study please.

## 2020-04-06 NOTE — Telephone Encounter (Signed)
Order placed for the patient for split night in order to restart the process of getting established with CPAP

## 2020-04-09 DIAGNOSIS — J069 Acute upper respiratory infection, unspecified: Secondary | ICD-10-CM | POA: Diagnosis not present

## 2020-04-11 DIAGNOSIS — J209 Acute bronchitis, unspecified: Secondary | ICD-10-CM | POA: Diagnosis not present

## 2020-04-21 ENCOUNTER — Ambulatory Visit: Payer: Medicare Other

## 2020-04-22 ENCOUNTER — Ambulatory Visit: Payer: Medicare Other

## 2020-05-01 ENCOUNTER — Ambulatory Visit (INDEPENDENT_AMBULATORY_CARE_PROVIDER_SITE_OTHER): Payer: Medicare Other | Admitting: Neurology

## 2020-05-01 ENCOUNTER — Other Ambulatory Visit: Payer: Self-pay

## 2020-05-01 DIAGNOSIS — R0683 Snoring: Secondary | ICD-10-CM

## 2020-05-01 DIAGNOSIS — G4733 Obstructive sleep apnea (adult) (pediatric): Secondary | ICD-10-CM

## 2020-05-01 DIAGNOSIS — G4701 Insomnia due to medical condition: Secondary | ICD-10-CM

## 2020-05-01 DIAGNOSIS — R0689 Other abnormalities of breathing: Secondary | ICD-10-CM

## 2020-05-01 DIAGNOSIS — Z8669 Personal history of other diseases of the nervous system and sense organs: Secondary | ICD-10-CM

## 2020-05-05 DIAGNOSIS — M47812 Spondylosis without myelopathy or radiculopathy, cervical region: Secondary | ICD-10-CM | POA: Diagnosis not present

## 2020-05-06 DIAGNOSIS — R0683 Snoring: Secondary | ICD-10-CM | POA: Insufficient documentation

## 2020-05-06 DIAGNOSIS — G4733 Obstructive sleep apnea (adult) (pediatric): Secondary | ICD-10-CM | POA: Insufficient documentation

## 2020-05-06 NOTE — Progress Notes (Signed)
IMPRESSION:  1. Moderately Severe Obstructive Sleep Apnea (OSA) with an AHI of 28.9/h and with strong REM and supine position dependency.REM AHI was 69.5 /hour, supine AHI was 58.1/h 2. Sleep hypoxemia clustered in supine and in REM sleep. Nadir SpO2 at 77%. Time spent below 89% saturation equaled 50 minutes.   3.Non-specific abnormal EKG-  No PLM disorder was noted !    RECOMMENDATIONS:  1. I would prefer an attended PAP titration study to optimize therapy and to carefully fit an interface. By type and severity of apnea this remains the number one treatment.  2. Given the history of CPAP intolerance, I would offer this patient the alternative option of a dental device or Inspire therapy. These therapies may not completely alleviate apnea nor hypoxemia but can reduce the AHI to a level of mild sleep apnea.  REM dependent sleep apnea will less likely respond, however a reduction by 84 events in NREM sleep out of a total of 123 events is possible.   3. The patient can also reduce her apnea by avoiding supine sleep position.

## 2020-05-06 NOTE — Procedures (Signed)
PATIENT'S NAME:  Terry Shannon, Terry Shannon DOB:      [birth year]      MR#:    [id number]     DATE OF RECORDING: 05/01/2020  B.Otto Herb M.D.:  Willey Blade, MD Study Performed:   Baseline Polysomnogram HISTORY:   Terry Shannon, a retired Ambulance person, had been diagnosed with sleep apnea but was unable to use CPAP. Her migraines persisted while using CPAP- she is not excessively daytime sleepy but fatigued and has chronic insomnia. She has a medical history of Rheumatoid arthritis- autoimmune- fatigue. 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  The patient endorsed the Epworth Sleepiness Scale at 3 points.   The patient's weight 183 pounds with a height of 66 (inches), resulting in a BMI of 29.4 kg/m2. The patient's neck circumference measured 14.5 inches.  CURRENT MEDICATIONS: Ambien, Xanax, Flexeril, Neurontin, ASA, Pristiq, Enbrel, Allegra, Emgality, Synthroid, Lialda, Zonegran, Armour, Imitrex, Propranolol, Protonix, Ditropan-XL, Omega 3, Reglan,   PROCEDURE:  This is a multichannel digital polysomnogram utilizing the Somnostar 11.2 system.  Electrodes and sensors were applied and monitored per AASM Specifications.   EEG, EOG, Chin and Limb EMG, were sampled at 200 Hz.  ECG, Snore and Nasal Pressure, Thermal Airflow, Respiratory Effort, CPAP Flow and Pressure, Oximetry was sampled at 50 Hz. Digital video and audio were recorded.      BASELINE STUDY Lights Out was at 23:09 and Lights On at 05:02.  Total recording time (TRT) was 353 minutes, with a total sleep time (TST) of 255.5 minutes.    The patient's sleep latency was 46 minutes.  REM latency was 219 minutes.  The sleep efficiency was 72.4 %.     SLEEP ARCHITECTURE: WASO (Wake after sleep onset) was 62 minutes.  There were 65.5 minutes in Stage N1, 98.5 minutes Stage N2, 63 minutes Stage N3 and 28.5 minutes in Stage REM.  The percentage of Stage N1 was 25.6%, Stage N2 was 38.6%, Stage N3 was 24.7% and Stage R (REM sleep) was 11.2%.   RESPIRATORY ANALYSIS:  There were a total of 123 respiratory events:  50 obstructive apneas, 0 central apneas and 73 hypopneas.     The total APNEA/HYPOPNEA INDEX (AHI) was 28.9/hour.  33 events occurred in REM sleep and 84 events in NREM. The REM AHI was 69.5 /hour, versus a non-REM AHI of 23.8. The patient spent 123 minutes of total sleep time in the supine position and 133 minutes in non-supine. The supine AHI was 58.1/h versus a non-supine AHI of 1.8.  OXYGEN SATURATION & C02:  The Wake baseline 02 saturation was 94%, with the lowest being 77%. Time spent below 89% saturation equaled 50 minutes.  The arousals were noted as: 38 were spontaneous, 0 were associated with PLMs, 86 were associated with respiratory events. The patient had a total of 0 Periodic Limb Movements.   Audio and video analysis did not show any abnormal or unusual movements, behaviors, phonations or vocalizations.   Moderate Snoring was noted.  EKG was in keeping with sinus rhythm (NSR) with variable heart rate- see screenshots.     IMPRESSION:  1. Moderately Severe Obstructive Sleep Apnea (OSA) with an AHI of 28.9/h and with strong REM and supine  position dependency. 2. Sleep hypoxemia clustered in supine and in REM sleep.  3. Periodic Limb Movement Disorder (PLMD) 4. Non-specific abnormal EKG   RECOMMENDATIONS:  1. I would prefer an attended PAP titration study to optimize therapy and to carefully fit an interface. By type and severity of apnea this remains the number one treatment.  2. Given the history of CPAP  intolerance, I would offer this patient the alternative option of a dental device or Inspire therapy. These therapies may not completely alleviate apnea nor hypoxemia but can reduce the AHI to a level of mild sleep apnea.  REM dependent sleep apnea will less likely respond, however a reduction by 84 events in NREM sleep out of a total of 123 events is possible.   3. The patient can also reduce her apnea by avoiding supine sleep position.    I certify that I have reviewed the entire raw data recording prior to the issuance of this report in accordance with the Standards of Accreditation of the American Academy of Sleep Medicine (AASM)    Larey Seat, MD Diplomat, American Board of Psychiatry and Neurology  Diplomat, American Board of Sleep Medicine Market researcher, [place] [facility] [employer]

## 2020-05-06 NOTE — Addendum Note (Signed)
Addended by: Larey Seat on: 05/06/2020 06:00 PM   Modules accepted: Orders

## 2020-05-07 ENCOUNTER — Telehealth: Payer: Self-pay | Admitting: Neurology

## 2020-05-07 NOTE — Telephone Encounter (Signed)
-----   Message from Larey Seat, MD sent at 05/06/2020  6:00 PM EST ----- IMPRESSION:  1. Moderately Severe Obstructive Sleep Apnea (OSA) with an AHI of 28.9/h and with strong REM and supine position dependency.REM AHI was 69.5 /hour, supine AHI was 58.1/h 2. Sleep hypoxemia clustered in supine and in REM sleep. Nadir SpO2 at 77%. Time spent below 89% saturation equaled 50 minutes.   3.Non-specific abnormal EKG-  No PLM disorder was noted !    RECOMMENDATIONS:  1. I would prefer an attended PAP titration study to optimize therapy and to carefully fit an interface. By type and severity of apnea this remains the number one treatment.  2. Given the history of CPAP intolerance, I would offer this patient the alternative option of a dental device or Inspire therapy. These therapies may not completely alleviate apnea nor hypoxemia but can reduce the AHI to a level of mild sleep apnea.  REM dependent sleep apnea will less likely respond, however a reduction by 84 events in NREM sleep out of a total of 123 events is possible.   3. The patient can also reduce her apnea by avoiding supine sleep position.

## 2020-05-07 NOTE — Telephone Encounter (Signed)
Called patient to discuss sleep study results. No answer at this time. LVM for the patient to call back.   

## 2020-05-11 ENCOUNTER — Telehealth: Payer: Self-pay

## 2020-05-11 ENCOUNTER — Telehealth: Payer: Self-pay | Admitting: Neurology

## 2020-05-11 DIAGNOSIS — M069 Rheumatoid arthritis, unspecified: Secondary | ICD-10-CM | POA: Diagnosis not present

## 2020-05-11 DIAGNOSIS — E559 Vitamin D deficiency, unspecified: Secondary | ICD-10-CM | POA: Diagnosis not present

## 2020-05-11 DIAGNOSIS — E663 Overweight: Secondary | ICD-10-CM | POA: Diagnosis not present

## 2020-05-11 DIAGNOSIS — E890 Postprocedural hypothyroidism: Secondary | ICD-10-CM | POA: Diagnosis not present

## 2020-05-11 DIAGNOSIS — G43909 Migraine, unspecified, not intractable, without status migrainosus: Secondary | ICD-10-CM | POA: Diagnosis not present

## 2020-05-11 DIAGNOSIS — Z6829 Body mass index (BMI) 29.0-29.9, adult: Secondary | ICD-10-CM | POA: Diagnosis not present

## 2020-05-11 DIAGNOSIS — Z23 Encounter for immunization: Secondary | ICD-10-CM | POA: Diagnosis not present

## 2020-05-11 DIAGNOSIS — K5190 Ulcerative colitis, unspecified, without complications: Secondary | ICD-10-CM | POA: Diagnosis not present

## 2020-05-11 DIAGNOSIS — Z0001 Encounter for general adult medical examination with abnormal findings: Secondary | ICD-10-CM | POA: Diagnosis not present

## 2020-05-11 DIAGNOSIS — J309 Allergic rhinitis, unspecified: Secondary | ICD-10-CM | POA: Diagnosis not present

## 2020-05-11 DIAGNOSIS — R7309 Other abnormal glucose: Secondary | ICD-10-CM | POA: Diagnosis not present

## 2020-05-11 DIAGNOSIS — E782 Mixed hyperlipidemia: Secondary | ICD-10-CM | POA: Diagnosis not present

## 2020-05-11 NOTE — Telephone Encounter (Signed)
I called the patient. She states that she doesn't feel like the Botox helped much at all, and is okay with stopping the injections. She feels okay with the Emgality, but would like to possibly explore other options to take along with it. I will cancel her appointment on the 4th of January unless you would like patient to keep it to discuss medication options. Please advise.

## 2020-05-11 NOTE — Telephone Encounter (Signed)
Called the patient back to advise to keep 1/4 appointment. Patient agreed.

## 2020-05-11 NOTE — Telephone Encounter (Signed)
-----   Message from Melvenia Beam, MD sent at 05/11/2020 12:42 PM EST ----- Regarding: RE: Botox Looks like I started Emgality at last appointment. If she is doing well on that alone we can cancel botox appointment thanks  ----- Message ----- From: Allegra Lai Sent: 05/11/2020  10:40 AM EST To: Gildardo Griffes, RN, Melvenia Beam, MD Subject: Botox                                          Received VM from patient about her 1/4 Botox appointment. She was under the impression Dr. Jaynee Eagles wanted to stop her Botox injections. Can you please let me know if we are stopping the injections?

## 2020-05-11 NOTE — Telephone Encounter (Signed)
Spoke with Dr Jaynee Eagles. Just have her keep the Jan 4th appt as an office visit to discuss that.

## 2020-05-11 NOTE — Telephone Encounter (Signed)
LVM for pt to call me back to schedule sleep study  

## 2020-05-12 DIAGNOSIS — R7309 Other abnormal glucose: Secondary | ICD-10-CM | POA: Diagnosis not present

## 2020-05-12 DIAGNOSIS — E890 Postprocedural hypothyroidism: Secondary | ICD-10-CM | POA: Diagnosis not present

## 2020-05-12 DIAGNOSIS — E782 Mixed hyperlipidemia: Secondary | ICD-10-CM | POA: Diagnosis not present

## 2020-05-12 DIAGNOSIS — M069 Rheumatoid arthritis, unspecified: Secondary | ICD-10-CM | POA: Diagnosis not present

## 2020-05-12 DIAGNOSIS — J309 Allergic rhinitis, unspecified: Secondary | ICD-10-CM | POA: Diagnosis not present

## 2020-05-12 DIAGNOSIS — E559 Vitamin D deficiency, unspecified: Secondary | ICD-10-CM | POA: Diagnosis not present

## 2020-05-13 ENCOUNTER — Ambulatory Visit: Payer: Self-pay | Admitting: Neurology

## 2020-05-13 DIAGNOSIS — Z23 Encounter for immunization: Secondary | ICD-10-CM | POA: Diagnosis not present

## 2020-05-26 ENCOUNTER — Ambulatory Visit: Payer: Medicare Other | Admitting: Neurology

## 2020-05-28 DIAGNOSIS — Z20822 Contact with and (suspected) exposure to covid-19: Secondary | ICD-10-CM | POA: Diagnosis not present

## 2020-06-09 ENCOUNTER — Ambulatory Visit: Payer: Medicare Other | Admitting: Neurology

## 2020-06-10 DIAGNOSIS — M0579 Rheumatoid arthritis with rheumatoid factor of multiple sites without organ or systems involvement: Secondary | ICD-10-CM | POA: Diagnosis not present

## 2020-06-12 DIAGNOSIS — M19071 Primary osteoarthritis, right ankle and foot: Secondary | ICD-10-CM | POA: Diagnosis not present

## 2020-06-12 DIAGNOSIS — M19072 Primary osteoarthritis, left ankle and foot: Secondary | ICD-10-CM | POA: Diagnosis not present

## 2020-06-14 ENCOUNTER — Other Ambulatory Visit: Payer: Self-pay

## 2020-06-14 ENCOUNTER — Ambulatory Visit (INDEPENDENT_AMBULATORY_CARE_PROVIDER_SITE_OTHER): Payer: Medicare Other | Admitting: Neurology

## 2020-06-14 DIAGNOSIS — G4733 Obstructive sleep apnea (adult) (pediatric): Secondary | ICD-10-CM | POA: Diagnosis not present

## 2020-06-14 DIAGNOSIS — R0689 Other abnormalities of breathing: Secondary | ICD-10-CM

## 2020-06-14 DIAGNOSIS — R0683 Snoring: Secondary | ICD-10-CM

## 2020-06-14 DIAGNOSIS — Z789 Other specified health status: Secondary | ICD-10-CM

## 2020-06-14 DIAGNOSIS — Z8669 Personal history of other diseases of the nervous system and sense organs: Secondary | ICD-10-CM

## 2020-06-14 DIAGNOSIS — G4701 Insomnia due to medical condition: Secondary | ICD-10-CM

## 2020-06-14 DIAGNOSIS — G8929 Other chronic pain: Secondary | ICD-10-CM

## 2020-06-16 DIAGNOSIS — M19071 Primary osteoarthritis, right ankle and foot: Secondary | ICD-10-CM | POA: Diagnosis not present

## 2020-06-17 DIAGNOSIS — Z789 Other specified health status: Secondary | ICD-10-CM | POA: Insufficient documentation

## 2020-06-17 NOTE — Progress Notes (Signed)
Cc Dr Karlton Lemon  DIAGNOSIS 1. Obstructive Sleep hypopnea responded well to a pressure of 8 cm water but the patient did not tolerate CPAP well. We tried the smallest nasal pillow P10. She recovered REM sleep at 8 cm water.   2. Sleep Related Hypoxemia was now resolved.  3. DDD/ Joint pan related (?) Moderate-severe Periodic Limb Movement Disorder was noted, without PLMs in REM sleep.  PLANS/RECOMMENDATIONS: The patient is indeed not able to tolerate CPAP. She only gained higher sleep quality and control of apnea for about 2.5 hours during the total sleep time that night. The PLMs correlate with use of CPAP and are likely an expression of CPAP intolerance. There were none during the baseline study.     DISCUSSION: Alternative treatment with either dental device or Inspire implant is to be considered.  These will not be able to reduce the AHI as much but can still improve the non -REM sleep dependent AHI related breathing pattern.  I also would also ask this patient to seek referral for pain management due to the presence of pain related chronic Insomnia.

## 2020-06-17 NOTE — Procedures (Signed)
PATIENT'S NAME:  Terry Shannon, Terry Shannon DOB:      09-26-1955      MR#:    [id number]     DATE OF RECORDING: 06/14/2020 REFERRING M.D.:  Willey Blade, MD Study Performed:   Titration to Positive Airway Pressure  The patient, Terry Shannon, has returned today on 06-15-2020 after her PSG from 05-01-2020 documented the following findings:     Moderately Severe Obstructive Sleep Apnea (OSA) with an AHI of 28.9/h and with strong REM and supine position dependency. REM AHI was 69.5 /hour, supine AHI was 58.1/h. Sleep hypoxemia clustered in supine and in REM sleep. Nadir SpO2 at 77%. Time spent below 89% saturation equaled 50 minutes.     I preferred an attended PAP titration study to optimize therapy and to carefully fit an interface. By type and severity of apnea this remains the number one treatment. Given the history of CPAP intolerance, I would offer this patient the alternative option of a dental device or Inspire therapy. These therapies may not completely alleviate apnea nor hypoxemia but can reduce the AHI to a level of mild sleep apnea.   The patient endorsed the Epworth Sleepiness Scale at 3 points. The patient's weight 183 pounds with a height of 66 (inches), resulting in a BMI of 29.4 kg/m2. The patient's neck circumference measured 14.5 inches.  CURRENT MEDICATIONS: Ambien, Xanax, Flexeril, Neurontin, ASA, Pristiq, Enbrel, Allegra, Emgality, Synthroid, Lialda, Zonegran, Armour, Imitrex, Propranolol, Protonix, Ditropan-XL, Omega 3, Reglan,   PROCEDURE:  This is a multichannel digital polysomnogram utilizing the SomnoStar 11.2 system.  Electrodes and sensors were applied and monitored per AASM Specifications.   EEG, EOG, Chin and Limb EMG, were sampled at 200 Hz.  ECG, Snore and Nasal Pressure, Thermal Airflow, Respiratory Effort, CPAP Flow and Pressure, Oximetry was sampled at 50 Hz. Digital video and audio were recorded.      CPAP was initiated at 4 cmH20 under a nasal pillow, with a small P 10 and  with heated humidity per AASM standards and the pressure was advanced to 9 cmH20 because of hypopneas, apneas and desaturations.  At a PAP pressure of 8 cmH20, there was a reduction of the AHI to 1.5/h with improvement of sleep apnea over a period of 82 minutes. All other pressures were not as well tolerated.  Lights Out was at 23:10 and Lights On at 04:38. Total recording time (TRT) was 328 minutes, with a total sleep time (TST) of 197 minutes. The patient's sleep latency was 62 minutes. REM latency was 255.5 minutes.  The sleep efficiency was 60.1 %.    SLEEP ARCHITECTURE: WASO (Wake after sleep onset  was at 71.5 minutes.  There were 26.5 minutes in Stage N1, 145 minutes Stage N2, 12.5 minutes Stage N3 and 13 minutes in Stage REM.  The percentage of Stage N1 was 13.5%, Stage N2 was 73.6%, Stage N3 was 6.3% and Stage R (REM sleep) was 6.6%.      RESPIRATORY ANALYSIS:  There was a total of 21 respiratory events: 0 apneas and an apnea index (AI) of 0 /hour. There were 21 hypopneas.   The total APNEA/HYPOPNEA INDEX (AHI) was 6.4 /hour. 1 event occurred in REM sleep and 20 events in NREM. The REM AHI was 4.6 /hour versus a non-REM AHI of 6.5 /hour.  The patient spent 155.5 minutes of total sleep time in the supine position and 42 minutes in non-supine. The supine AHI was 8.1, versus a non-supine AHI of 0.0.  OXYGEN SATURATION & C02:  The baseline 02 saturation was 94%, with the lowest being 84%. Time spent below 89% saturation equaled 2 minutes.  The arousals were noted as: 15 were spontaneous, 14 were associated with PLMs, 15 were associated with respiratory events. The patient had a total of 240 Periodic Limb Movements. The Periodic Limb Movement (PLM) Arousal index was 4.3 /hour. Audio and video analysis did not show any abnormal or unusual movements, behaviors, phonations or vocalizations.    No Snoring was noted on CPAP. EKG was in keeping with normal sinus rhythm  (NSR).  DIAGNOSIS 1. Obstructive Sleep hypopnea responded well to a pressure of 8 cm water but the patient did not tolerate CPAP well. We tried the smallest nasal pillow P10. She recovered REM sleep at 8 cm water.   2. Sleep Related Hypoxemia was now resolved.  3. DDD/ Joint pan related (?) Moderate-severe Periodic Limb Movement Disorder was noted, without PLMs in REM sleep.  PLANS/RECOMMENDATIONS: The patient is indeed not able to tolerate CPAP. She only gained higher sleep quality and control of apnea for about 2.5 hours during the total sleep time that night. The PLMs correlate with use of CPAP and are likely an expression of CPAP intolerance. There were none during the baseline study.     DISCUSSION: Alternative treatment with either dental device or Inspire implant is to be considered.  These will not be able to reduce the AHI as much but can still improve the non -REM sleep dependent AHI related breathing pattern.  I also would also ask this patient to seek referral for pain management due to the presence of pain related chronic Insomnia.   A follow up appointment will be scheduled in the Sleep Clinic at [facility].   Please call [phone] with any questions.      I certify that I have reviewed the entire raw data recording prior to the issuance of this report in accordance with the Standards of Accreditation of the American Academy of Sleep Medicine (AASM)      [] Larey Seat, M.D. Diplomat, Tax adviser of Psychiatry and Neurology  Diplomat, Tax adviser of Sleep Medicine Market researcher, [employer] [facility] at [employer]

## 2020-06-21 ENCOUNTER — Other Ambulatory Visit: Payer: Self-pay | Admitting: Neurology

## 2020-06-22 ENCOUNTER — Telehealth: Payer: Self-pay | Admitting: Neurology

## 2020-06-22 DIAGNOSIS — M47812 Spondylosis without myelopathy or radiculopathy, cervical region: Secondary | ICD-10-CM | POA: Diagnosis not present

## 2020-06-22 DIAGNOSIS — M7918 Myalgia, other site: Secondary | ICD-10-CM | POA: Diagnosis not present

## 2020-06-22 NOTE — Telephone Encounter (Signed)
-----   Message from Larey Seat, MD sent at 06/17/2020 12:50 PM EST ----- Cc Dr Karlton Lemon  DIAGNOSIS 1. Obstructive Sleep hypopnea responded well to a pressure of 8 cm water but the patient did not tolerate CPAP well. We tried the smallest nasal pillow P10. She recovered REM sleep at 8 cm water.   2. Sleep Related Hypoxemia was now resolved.  3. DDD/ Joint pan related (?) Moderate-severe Periodic Limb Movement Disorder was noted, without PLMs in REM sleep.  PLANS/RECOMMENDATIONS: The patient is indeed not able to tolerate CPAP. She only gained higher sleep quality and control of apnea for about 2.5 hours during the total sleep time that night. The PLMs correlate with use of CPAP and are likely an expression of CPAP intolerance. There were none during the baseline study.     DISCUSSION: Alternative treatment with either dental device or Inspire implant is to be considered.  These will not be able to reduce the AHI as much but can still improve the non -REM sleep dependent AHI related breathing pattern.  I also would also ask this patient to seek referral for pain management due to the presence of pain related chronic Insomnia.

## 2020-06-22 NOTE — Telephone Encounter (Signed)
Called patient to discuss sleep study results. No answer at this time. LVM for the patient to call back.   

## 2020-06-24 DIAGNOSIS — M150 Primary generalized (osteo)arthritis: Secondary | ICD-10-CM | POA: Diagnosis not present

## 2020-06-24 DIAGNOSIS — R5382 Chronic fatigue, unspecified: Secondary | ICD-10-CM | POA: Diagnosis not present

## 2020-06-24 DIAGNOSIS — M0579 Rheumatoid arthritis with rheumatoid factor of multiple sites without organ or systems involvement: Secondary | ICD-10-CM | POA: Diagnosis not present

## 2020-06-24 DIAGNOSIS — M2550 Pain in unspecified joint: Secondary | ICD-10-CM | POA: Diagnosis not present

## 2020-06-24 DIAGNOSIS — E663 Overweight: Secondary | ICD-10-CM | POA: Diagnosis not present

## 2020-06-24 DIAGNOSIS — Z6829 Body mass index (BMI) 29.0-29.9, adult: Secondary | ICD-10-CM | POA: Diagnosis not present

## 2020-06-25 NOTE — Telephone Encounter (Signed)
Called the patient a second time to review sleep study results.  There was no answer.  Left a voicemail for the patient to call back.

## 2020-06-28 ENCOUNTER — Other Ambulatory Visit: Payer: Self-pay | Admitting: Neurology

## 2020-06-28 DIAGNOSIS — G43711 Chronic migraine without aura, intractable, with status migrainosus: Secondary | ICD-10-CM

## 2020-06-29 ENCOUNTER — Encounter: Payer: Self-pay | Admitting: Neurology

## 2020-06-30 ENCOUNTER — Other Ambulatory Visit: Payer: Self-pay | Admitting: Neurology

## 2020-06-30 DIAGNOSIS — G4733 Obstructive sleep apnea (adult) (pediatric): Secondary | ICD-10-CM

## 2020-06-30 NOTE — Telephone Encounter (Signed)
Patient returned call. I was able to review the sleep study titration with her. Advised the patient that we were able to reduce the apnea down to 1.5 under a pressure of 8 cm water pressure. Advised symptomatic marinated patient was not able to tolerate the CPAP and was only able to get about 2-1/2 hours of sleep that night. Reviewed alternative treatment recommendations that Dr. Brett Fairy had discussed. Advised the patient on inspire and dental device. Patient is concerned that neither will be able to 100% effectively treat the REM apnea. Patient would like to attempt CPAP once more. Advised I will send the order to the DME company, [facility]). Instructed the patient to look out for a call from the company. Advised that there is a Tree surgeon on CPAP machines and it may be a while before she is able to get set up. Instructed the patient to contact our office to schedule a 31 to 90-day follow-up. Patient verbalized understanding. Patient had no further questions and was appreciative for the information.

## 2020-07-07 DIAGNOSIS — M47812 Spondylosis without myelopathy or radiculopathy, cervical region: Secondary | ICD-10-CM | POA: Diagnosis not present

## 2020-08-02 ENCOUNTER — Telehealth: Payer: Self-pay | Admitting: *Deleted

## 2020-08-03 DIAGNOSIS — M47816 Spondylosis without myelopathy or radiculopathy, lumbar region: Secondary | ICD-10-CM | POA: Diagnosis not present

## 2020-08-13 DIAGNOSIS — M0579 Rheumatoid arthritis with rheumatoid factor of multiple sites without organ or systems involvement: Secondary | ICD-10-CM | POA: Diagnosis not present

## 2020-08-14 DIAGNOSIS — H353131 Nonexudative age-related macular degeneration, bilateral, early dry stage: Secondary | ICD-10-CM | POA: Diagnosis not present

## 2020-08-14 DIAGNOSIS — H16223 Keratoconjunctivitis sicca, not specified as Sjogren's, bilateral: Secondary | ICD-10-CM | POA: Diagnosis not present

## 2020-08-14 DIAGNOSIS — H04123 Dry eye syndrome of bilateral lacrimal glands: Secondary | ICD-10-CM | POA: Diagnosis not present

## 2020-08-14 DIAGNOSIS — H25013 Cortical age-related cataract, bilateral: Secondary | ICD-10-CM | POA: Diagnosis not present

## 2020-08-27 DIAGNOSIS — M47816 Spondylosis without myelopathy or radiculopathy, lumbar region: Secondary | ICD-10-CM | POA: Diagnosis not present

## 2020-09-02 DIAGNOSIS — H9202 Otalgia, left ear: Secondary | ICD-10-CM | POA: Diagnosis not present

## 2020-09-03 ENCOUNTER — Telehealth: Payer: Self-pay | Admitting: Neurology

## 2020-09-03 NOTE — Telephone Encounter (Signed)
Pt has rescheduled her cancelled appointment from January and is on wait list.  Pt is asking for a call from RN of Dr Jaynee Eagles to discuss what is going on with her neck issues.  Please call.

## 2020-09-07 NOTE — Telephone Encounter (Signed)
I called the patient. She wanted to let Dr Jaynee Eagles know she has had one shot in her neck. Pt had a low back issue come up, pain in legs, went ahead and did nerve ablation in low back in the middle of this. Pending a second injection in the neck. She would like Xanax refilled. She thinks its taken her 1-2 years to go through last refill. She takes it before she has the ablation. Also waiting on CPAP machine currently, on standby. I let pt know I would check with Dr Jaynee Eagles about the Xanax and give her a call back. Pt verbalized appreciation.

## 2020-09-08 ENCOUNTER — Other Ambulatory Visit: Payer: Self-pay | Admitting: Neurology

## 2020-09-08 MED ORDER — ALPRAZOLAM 0.5 MG PO TABS: 0.5000 mg | ORAL_TABLET | Freq: Three times a day (TID) | ORAL | 0 refills | Status: DC | PRN

## 2020-09-08 NOTE — Telephone Encounter (Signed)
I provided one refill thanks

## 2020-09-09 NOTE — Telephone Encounter (Signed)
Per [place] registry, last refilled on 10/12/2018 Alprazolam 0.5 Mg Tablet #30.00

## 2020-10-08 DIAGNOSIS — R5383 Other fatigue: Secondary | ICD-10-CM | POA: Diagnosis not present

## 2020-10-08 DIAGNOSIS — Z79899 Other long term (current) drug therapy: Secondary | ICD-10-CM | POA: Diagnosis not present

## 2020-10-08 DIAGNOSIS — M0579 Rheumatoid arthritis with rheumatoid factor of multiple sites without organ or systems involvement: Secondary | ICD-10-CM | POA: Diagnosis not present

## 2020-10-08 DIAGNOSIS — H1033 Unspecified acute conjunctivitis, bilateral: Secondary | ICD-10-CM | POA: Diagnosis not present

## 2020-10-29 ENCOUNTER — Other Ambulatory Visit: Payer: Self-pay | Admitting: Neurology

## 2020-11-06 DIAGNOSIS — M79672 Pain in left foot: Secondary | ICD-10-CM | POA: Diagnosis not present

## 2020-11-06 DIAGNOSIS — B070 Plantar wart: Secondary | ICD-10-CM | POA: Diagnosis not present

## 2020-11-06 DIAGNOSIS — M47812 Spondylosis without myelopathy or radiculopathy, cervical region: Secondary | ICD-10-CM | POA: Diagnosis not present

## 2020-11-10 DIAGNOSIS — K5190 Ulcerative colitis, unspecified, without complications: Secondary | ICD-10-CM | POA: Diagnosis not present

## 2020-11-10 DIAGNOSIS — E890 Postprocedural hypothyroidism: Secondary | ICD-10-CM | POA: Diagnosis not present

## 2020-11-10 DIAGNOSIS — M069 Rheumatoid arthritis, unspecified: Secondary | ICD-10-CM | POA: Diagnosis not present

## 2020-11-11 ENCOUNTER — Other Ambulatory Visit: Payer: Self-pay | Admitting: *Deleted

## 2020-11-11 ENCOUNTER — Other Ambulatory Visit: Payer: Self-pay | Admitting: Neurology

## 2020-11-19 DIAGNOSIS — M47812 Spondylosis without myelopathy or radiculopathy, cervical region: Secondary | ICD-10-CM | POA: Diagnosis not present

## 2020-11-24 DIAGNOSIS — M25552 Pain in left hip: Secondary | ICD-10-CM | POA: Diagnosis not present

## 2020-11-24 DIAGNOSIS — M7062 Trochanteric bursitis, left hip: Secondary | ICD-10-CM | POA: Diagnosis not present

## 2020-11-29 ENCOUNTER — Other Ambulatory Visit: Payer: Self-pay | Admitting: Neurology

## 2020-12-03 DIAGNOSIS — M0579 Rheumatoid arthritis with rheumatoid factor of multiple sites without organ or systems involvement: Secondary | ICD-10-CM | POA: Diagnosis not present

## 2020-12-07 DIAGNOSIS — M19171 Post-traumatic osteoarthritis, right ankle and foot: Secondary | ICD-10-CM | POA: Diagnosis not present

## 2020-12-07 DIAGNOSIS — M2021 Hallux rigidus, right foot: Secondary | ICD-10-CM | POA: Diagnosis not present

## 2020-12-07 DIAGNOSIS — Z96662 Presence of left artificial ankle joint: Secondary | ICD-10-CM | POA: Diagnosis not present

## 2020-12-07 DIAGNOSIS — M19172 Post-traumatic osteoarthritis, left ankle and foot: Secondary | ICD-10-CM | POA: Diagnosis not present

## 2020-12-14 DIAGNOSIS — M47812 Spondylosis without myelopathy or radiculopathy, cervical region: Secondary | ICD-10-CM | POA: Diagnosis not present

## 2020-12-14 DIAGNOSIS — M47816 Spondylosis without myelopathy or radiculopathy, lumbar region: Secondary | ICD-10-CM | POA: Diagnosis not present

## 2020-12-15 ENCOUNTER — Encounter: Payer: Self-pay | Admitting: Neurology

## 2020-12-15 ENCOUNTER — Ambulatory Visit (INDEPENDENT_AMBULATORY_CARE_PROVIDER_SITE_OTHER): Payer: Medicare Other | Admitting: Neurology

## 2020-12-15 ENCOUNTER — Other Ambulatory Visit: Payer: Self-pay

## 2020-12-15 VITALS — BP 133/85 | HR 58 | Ht 66.0 in | Wt 193.6 lb

## 2020-12-15 DIAGNOSIS — M5417 Radiculopathy, lumbosacral region: Secondary | ICD-10-CM

## 2020-12-15 DIAGNOSIS — M5412 Radiculopathy, cervical region: Secondary | ICD-10-CM

## 2020-12-15 DIAGNOSIS — G43711 Chronic migraine without aura, intractable, with status migrainosus: Secondary | ICD-10-CM

## 2020-12-15 DIAGNOSIS — G4733 Obstructive sleep apnea (adult) (pediatric): Secondary | ICD-10-CM

## 2020-12-15 DIAGNOSIS — G4484 Primary exertional headache: Secondary | ICD-10-CM

## 2020-12-15 MED ORDER — PROPRANOLOL HCL 10 MG PO TABS: ORAL_TABLET | ORAL | 3 refills | Status: DC

## 2020-12-15 MED ORDER — PROPRANOLOL HCL ER 80 MG PO CP24: 80.0000 mg | ORAL_CAPSULE | Freq: Every day | ORAL | 4 refills | Status: DC

## 2020-12-15 MED ORDER — ALPRAZOLAM 0.5 MG PO TABS: ORAL_TABLET | ORAL | 0 refills | Status: AC

## 2020-12-15 MED ORDER — SUMATRIPTAN SUCCINATE 100 MG PO TABS: ORAL_TABLET | ORAL | 11 refills | Status: DC

## 2020-12-15 MED ORDER — ZONISAMIDE 100 MG PO CAPS: 100.0000 mg | ORAL_CAPSULE | Freq: Every day | ORAL | 3 refills | Status: DC

## 2020-12-15 NOTE — Progress Notes (Signed)
[id number]    Provider:  Dr Jaynee Eagles Referring Provider: Willey Blade, MD Primary Care Physician:  Willey Blade, MD    CC:  Migraine   Interval history 12/15/2020: [name] with chronic migraines, she has failed multiple medications, she also has exercise-induced headaches, she is on propranolol, zonisamide, we sent  her dry needling, she is not using her CPAP and I did discuss that with her - see below.  She takes Imitrex and Reglan at onset of headache.  She is tried and failed multiple medications:Topamax, amitriptyline, gabapentin, Flexeril, propranolol, Pristiq, Reglan, tylenol, nsaids, asa, and she has tried a lot that she can't remember and was on these for years, stopped working.Zonegran .  She was doing well on propranolol and zonogram and we started Botox. And she stopped that Baseline is daily migraines and headaches.We also tried emgality but she stopped that. Dry needling helped a lot. Still on propranolol, gabapentin, flexeril and stopped the zonisamide and the emgality but migraines seem to be under control.     Migraines, had several botox treatments, botox did not really help but she feels her migraines are overll better a few a month and taking acute management sumatriptan.medication but of course hers was more exertional and she hasn't been exerting herself due to neck and back pain.    Fatigue and prior diagnosis of OSA: Was referred back to sleep studies Dr. Brett Fairy and she was seen and evaluated 03/24/2020 and restested Moderately Severe Obstructive Sleep Apnea (OSA) with an AHI of 28.9/h and with strong REM and supine position dependency.REM AHI was 69.5 /hour, supine AHI was 58.1/h. Working on getting a cpap or inspire.           Chronic neck and low back pains:  , she has chronic neck pain and we sent her to physical therapy and dry needling, she has had it for many years, last MRI in 01/2020 (personally reviewed images)  showed multilevel spondylosis and degenerative changes, she has tried other conservative measures,  and pursued interventional treatment such as epidural steroid injections, medial branch blocks, or other possibly surgical.Dry needling helped and the exercises helped. Repeated MRI cervical spine and then send to Dr. Mina Marble for interventional procedures for her severe neck pain  and is feeling better. She also has low back pain and is having PT and getting injections there.   MRI cervical spine  01/2020:  IMPRESSION:   MRI cervical spine (without) demonstrating: - Degenerative spondylosis and disc bulging from C4-5 to T2-3. No spinal stenosis or foraminal narrowing. - Mild chronic small vessel ischemic disease in the pons.   Interval history 07/01/19: 65 year old with a past medical history sleep apnea, rheumatoid arthritis, osteoarthritis, chronic migraines for over 2 decades (when I initially saw her quality, frequency, severity and triggers had not significantly changed in many years), hypothyroidism, depression, chronic low back pain. this is a patient I been seeing for several years, chronic migraines and exertional headaches, she has been doing extremely well on propranolol and zonisamide, her blood pressure has been a little elevated, I last visit we increase propranolol to help with headaches as well as blood pressure.  She is also on zonisamide for her migraines.  Reglan and Imitrex as tolerated for acute management.  Today she is not doing as well. She reports her neck is worsening, she can walk her dog around the block and she has a migraine, any movement with her upper body can cause a migraine, she had  a nerve ablation in the low back last year and she is going to see Dr. Jacelyn Grip.  She is having daily migraines and headaches, migraines can be moderately severe to severe, they can last upwards of 24 hours, no aura, no medication overuse, ongoing for the last year at the severity and frequency, worsening  exertional quality, she can get a headache even folding close.  This is concerning we need imaging.Will start botox.  Tried: Topamax, amitriptyline, gabapentin, Flexeril, propranolol, Pristiq, Reglan, tylenol, nsaids, asa, and she has tried a lot that she can't remember and was on these for years, stopped working.Taking Zonegran now.  Interval history 10/11/2018: Patient continues to do well.  She still has exertional headaches when she walks and her blood pressure runs "a little on the high side".  We discussed increasing the propranolol to 80 mg as this is a very good headache/migraine medication, and can help with her slightly elevated high blood pressure.  We also discussed she takes the benzodiazepine very rarely, before procedures, sometimes for anxiety, advised her to continue to take it very sparingly and situationally, discussed addiction and overuse and risks of respiratory depression.  Also discussed side effects of propranolol.  We will continue her zonisamide for her migraines.  We will continue Reglan and Imitrex as tolerated for acute management. The headaches start in the back of her neck, it gets tight then spreads to the left side of her head. It is poinding, throbbing, pain behind the eye temple pounding, +light sensitivity, +sound sensitivity, + nausea, no vomiting in over 10 years. Another trigger is exercise. When she exercise she will get a migraine that day.  10/04/2017: She is doing exceptionally well. She tolerates the medication. She is very thankful. Discussed her current regimen, the new medications anti-CGRP meds should she every need them, also botox for migraine and other preventative and acute management but she very happy as is.  Discussed that she now gets headaches and moigraines after her ESI or LBP, unclear if it is the anxiety of the shot, discussed options will give xanax prn   10/04/2016: Patient is here for chronic migraines. She is improved with regard to the  migraines. She still has exertional headaches. Her blood pressure is slightly elevated. Discussed increasing propranolol for exertional headaches and may also help with hypertension she should be monitoring her blood pressure daily. Discussed exertional headaches. Discussed hypertension and following up with her primary care for management. She is doing well on Zonisamide. We'll continue 100 mg in the evenings. Discussed headache triggers, environmental triggers, stress triggers and lifestyle.   HPI:  Terry Shannon is a 65 y.o. female here as a second opinion from my colleague Dr. Brett Fairy  for migraines. She has had the migraines since the age of 63. Certain foods and drinks are triggers and she has taken those out of her diet, like wine and aspertame. The headaches start in the back of her neck, it gets tight then spreads to the left side of her head. It is poinding, throbbing, pain behind the eye temple pounding, +light sensitivity, +sound sensitivity, + nausea, no vomiting in over 10 years. Another trigger is exercise. When she exercise she will get a migraine that day. No medicaton overuse headache. Has been going on for years at this frequency. She has 16 migraines a month, they can last for 1-3 days, on average they are 8/10 in severity. They started years ago after a myelogram. No FHx of migraines. She was recently started on  Zonegran by Dr. Roddie Mc which she feels may be helping. Reports that she has had normal brain imaging in the past.   Tried: Topamax, amitriptyline, propranolol and she has tried a lot that she can't remember and was on these for years, stopped working.Taking Zonegran now. She uses flexeril and an aspirin now on onset which seems to help a little. Imitrex used to help but stopped helping. She tried zomig and mazalt and relpax.  Has contraindication to NSAIDs but still takes aspirin as this is the only thing that appears to help acutely.    Reviewed notes, labs and imaging from  outside physicians, which showed: Personally reviewed images and agree with the following   Vertebral Arteries: BILATERAL patent, LEFT dominant.   Paraspinal tissues: Paravertebral anterior soft tissues are obscured by saturation bands. No visible adenopathy.   Disc levels:   The individual disc spaces were examined as follows:   C2-3: Normal disc space. Trace anterolisthesis. Severe left-sided facet arthropathy. No foraminal narrowing.   C3-4: Moderately severe disc space narrowing. Fusion across the facet joints. No impingement.   C4-5: Central disc osteophyte complex. Trace anterolisthesis. No impingement.   C5-6: Moderate disc space narrowing. Asymmetric foraminal narrowing on the RIGHT due to uncinate spurring. RIGHT C6 nerve root impingement not excluded.   C6-7: Central disc osteophyte complex. No stenosis or foraminal narrowing.   C7-T1: 3 mm anterolisthesis is facet mediated. Advanced facet arthropathy without impingement.   IMPRESSION: Multilevel spondylosis as described. No significant left-sided foraminal narrowing or leftward disc protrusion.   Asymmetric facet arthropathy on the LEFT is most notable at C2-3. Correlate clinically for facet mediated upper extremity symptoms.   Review of Systems: Patient complains of symptoms per HPI as well as the following symptoms: neck and back pain . Pertinent negatives and positives per HPI. All others negative    Social History   Socioeconomic History   Marital status: Significant Other    Spouse name: Not on file   Number of children: 0   Years of education: Not on file   Highest education level: Bachelor's degree (e.g., BA, AB, BS)  Occupational History   Not on file  Tobacco Use   Smoking status: Never   Smokeless tobacco: Never  Vaping Use   Vaping Use: Never used  Substance and Sexual Activity   Alcohol use: Yes    Comment: 2-3 drinks/week   Drug use: No   Sexual activity: Not Currently    Birth  control/protection: None  Other Topics Concern   Not on file  Social History Narrative   Lives at home with her partner and two daughters   Right handed   Caffeine; 1 cup/daily.     Social Determinants of Health   Financial Resource Strain: Not on file  Food Insecurity: Not on file  Transportation Needs: Not on file  Physical Activity: Not on file  Stress: Not on file  Social Connections: Not on file  Intimate Partner Violence: Not on file    Family History  Problem Relation Age of Onset   Stroke Mother    Cancer Father 77       lung   Diabetes Brother    Barrett's esophagus Brother    Cancer Maternal Grandmother 72       colon   Stroke Maternal Grandmother    Stroke Maternal Grandfather    Diabetes Paternal Grandmother     Past Medical History:  Diagnosis Date   Allergy    year-round, pt. states  Chronic lower back pain    CMC arthritis, thumb, degenerative [id number]   right   Complication of anesthesia    slow to wake up after gallbladder surgery   Depression    Eczema    ARMS AND HANDS   GERD (gastroesophageal reflux disease)    History of thyroid cancer    Hypothyroidism    Migraines    Osteoarthritis    Overactive bladder    RA (rheumatoid arthritis) ([place])    Sleep apnea    no CPAP use   Squamous cell carcinoma of skin 12/21/2017   in situ-mid chest (CX35FU)   Ulcerative colitis [facility])     Past Surgical History:  Procedure Laterality Date   ABDOMINAL HERNIA REPAIR  123456   periumbilical ventral hernia/incisional hernia   BUNIONECTOMY Right    x 2 more   BUNIONECTOMY Left    BUNIONECTOMY WITH CHILECTOMY Right 12/16/2005   CARPAL TUNNEL RELEASE Right 07/13/2001   CARPAL TUNNEL RELEASE Left 08/10/2001   DILATION AND CURETTAGE OF UTERUS     HAMMER TOE SURGERY Left may 2014   HYSTEROSCOPY WITH D & C  03/01/2004   with exc. of endometrial polyp   KNEE ARTHROSCOPY Right 2013   LAPAROSCOPIC CHOLECYSTECTOMY  11/03/2006   LUMBAR FUSION  03/06/2014    l4  l5    nerve ablation lumbar      THYROIDECTOMY, PARTIAL Right prior to 2002   THYROIDECTOMY, PARTIAL Left 11/29/2000   and isthmus   TONSILLECTOMY  1961   TOTAL ANKLE REPLACEMENT Left 11/2016   with tendon repair   TOTAL KNEE ARTHROPLASTY Right 04/01/2013   Procedure: RIGHT TOTAL KNEE ARTHROPLASTY;  Surgeon: Alta Corning, MD;  Location: [place];  Service: Orthopedics;  Laterality: Right;    Current Outpatient Medications  Medication Sig Dispense Refill   Ascorbic Acid (VITAMIN C PO) Take 6,000 mg by mouth daily.     aspirin EC 325 MG tablet Take 325 mg by mouth 2 (two) times daily as needed for mild pain.      B Complex-C (SUPER B COMPLEX PO) Take 1 tablet by mouth daily.     Cholecalciferol (VITAMIN D3 PO) Take 1 tablet by mouth daily.      cyclobenzaprine (FLEXERIL) 10 MG tablet Take 10 mg by mouth 3 (three) times daily as needed for muscle spasms.     desvenlafaxine (PRISTIQ) 100 MG 24 hr tablet Take 100 mg by mouth daily.      Flax OIL Take by mouth daily.     gabapentin (NEURONTIN) 300 MG capsule Take 300 mg by mouth 4 (four) times daily.      golimumab (SIMPONI ARIA) 50 MG/4ML SOLN injection See admin instructions.     levothyroxine (SYNTHROID) 50 MCG tablet Take 50 mcg by mouth daily before breakfast.     mesalamine (LIALDA) 1.2 G EC tablet Take 2.4 g by mouth 2 (two) times daily.      metoCLOPramide (REGLAN) 10 MG tablet Take 1 tablet (10 mg total) by mouth every 8 (eight) hours as needed. Take to treat migraines and/or nausea. 20 tablet 12   oxybutynin (DITROPAN-XL) 10 MG 24 hr tablet Take 10 mg by mouth daily.     pantoprazole (PROTONIX) 40 MG tablet Take 40 mg by mouth daily.     Probiotic Product (PROBIOTIC DAILY PO) Take 1 tablet by mouth daily.      propranolol (INDERAL) 10 MG tablet Tke 30-60 minutes prior to exercise for exercise-induced headaches. 30 tablet 3  RESTASIS MULTIDOSE 0.05 % ophthalmic emulsion Place 1 drop into both eyes 2 (two) times daily.      Rosuvastatin Calcium (CRESTOR PO) Take 20 mg by mouth daily.      thyroid (ARMOUR) 60 MG tablet Take 60 mg by mouth daily before breakfast.     zolpidem (AMBIEN CR) 12.5 MG CR tablet Take 12.5 mg by mouth at bedtime.     ALPRAZolam (XANAX) 0.5 MG tablet Take only before procedures (injections into the neck and low back) up to 3x a day 30 tablet 0   propranolol ER (INDERAL LA) 80 MG 24 hr capsule Take 1 capsule (80 mg total) by mouth at bedtime. For migraine prevention and high blood pressure. 90 capsule 4   SUMAtriptan (IMITREX) 100 MG tablet TAKE 1 TABLET BY MOUTH ONCE DAILY AS NEEDED. MAY REPEAT IN 2 HOURS IF HEADACHE PERSISTS OR RECURS. 10 tablet 11   zonisamide (ZONEGRAN) 100 MG capsule Take 1 capsule (100 mg total) by mouth daily. For migraine prevention. 90 capsule 3   No current facility-administered medications for this visit.    Allergies as of 12/15/2020 - Review Complete 12/15/2020  Allergen Reaction Noted   Codeine Nausea And Vomiting 09/09/2010   Hydrocodone Nausea And Vomiting 08/08/2012   Ibuprofen Other (See Comments) 08/08/2012   Iodinated diagnostic agents Hives and Itching 09/16/2010   Methotrexate derivatives Other (See Comments) 08/08/2012   Oxycodone Nausea And Vomiting 04/01/2013   Tylenol [acetaminophen] Other (See Comments) 08/08/2012   Aleve [naproxen sodium]     Sulfa antibiotics  11/01/2011   Neosporin [neomycin-bacitracin zn-polymyx] Swelling 07/18/2013    Vitals: BP 133/85   Pulse (!) 58   Ht '5\' 6"'$  (1.676 m)   Wt 193 lb 9.6 oz (87.8 kg)   BMI 31.25 kg/m  Last Weight:  Wt Readings from Last 1 Encounters:  12/15/20 193 lb 9.6 oz (87.8 kg)   Last Height:   Ht Readings from Last 1 Encounters:  12/15/20 '5\' 6"'$  (1.676 m)   Exam: NAD, pleasant                  Speech:    Speech is normal; fluent and spontaneous with normal comprehension.  Cognition:    The patient is oriented to person, place, and time;     recent and remote memory intact;      language fluent;    Cranial Nerves:    The pupils are equal, round, and reactive to light.Trigeminal sensation is intact and the muscles of mastication are normal. The face is symmetric. The palate elevates in the midline. Hearing intact. Voice is normal. Shoulder shrug is normal. The tongue has normal motion without fasciculations.   Coordination:  No dysmetria  Motor Observation:    No asymmetry, no atrophy, and no involuntary movements noted. Tone:    Normal muscle tone.     Strength:    Strength is V/V in the upper and lower limbs.      Sensation: intact to LT          Assessment/Plan:  [name] with chronic migraines, she has failed multiple medications, she also has exercise-induced headaches, she is on propranolol, zonisamide, we sent  her dry needling,  She takes Imitrex and Reglan at onset of headache.  She is tried and failed multiple medications:Topamax, amitriptyline, gabapentin, Flexeril, propranolol, Pristiq, Reglan, tylenol, nsaids, asa, and she has tried a lot that she can't remember and was on these for years, stopped working. She  was doing well on propranolol and zonogram and we started Botox and emgality which she does not take anymore.  Baseline was daily migraines and headaches and now episodic.We also tried emgality but she stopped that. Dry needling helped a lot. Still on propranolol, gabapentin, flexeril and zonisamide migraines seem to be under control.     Migraines, had several botox treatments, botox did not really help but she feels her migraines are overll better a few a month and taking acute management sumatriptan.medication but of course hers was more exertional and she hasn't been exerting herself due to neck and back pain.   Exertional headaches: propranolol '10mg'$  30-60 mins prior  Xanax: ONLY prior to ESI/cervical lumbar injections  Fatigue and prior diagnosis of OSA: Was referred back to sleep studies Dr. Brett Fairy and she was seen  and evaluated 03/24/2020 and restested Moderately Severe Obstructive Sleep Apnea (OSA) with an AHI of 28.9/h and with strong REM and supine position dependency.REM AHI was 69.5 /hour, supine AHI was 58.1/h. Working on getting a cpap or inspire.           Chronic neck and low back pains:  , she has chronic neck pain and we sent her to physical therapy and dry needling, she has had it for many years, PT and getting injections.    No orders of the defined types were placed in this encounter.  Meds ordered this encounter  Medications   propranolol ER (INDERAL LA) 80 MG 24 hr capsule    Sig: Take 1 capsule (80 mg total) by mouth at bedtime. For migraine prevention and high blood pressure.    Dispense:  90 capsule    Refill:  4   zonisamide (ZONEGRAN) 100 MG capsule    Sig: Take 1 capsule (100 mg total) by mouth daily. For migraine prevention.    Dispense:  90 capsule    Refill:  3   SUMAtriptan (IMITREX) 100 MG tablet    Sig: TAKE 1 TABLET BY MOUTH ONCE DAILY AS NEEDED. MAY REPEAT IN 2 HOURS IF HEADACHE PERSISTS OR RECURS.    Dispense:  10 tablet    Refill:  11   propranolol (INDERAL) 10 MG tablet    Sig: Tke 30-60 minutes prior to exercise for exercise-induced headaches.    Dispense:  30 tablet    Refill:  3   ALPRAZolam (XANAX) 0.5 MG tablet    Sig: Take only before procedures (injections into the neck and low back) up to 3x a day    Dispense:  30 tablet    Refill:  0    Keep on file, do not fill      Discussed again: To prevent or relieve headaches, try the following: Cool Compress. Lie down and place a cool compress on your head.  Avoid headache triggers. If certain foods or odors seem to have triggered your migraines in the past, avoid them. A headache diary might help you identify triggers.  Include physical activity in your daily routine. Try a daily walk or other moderate aerobic exercise.  Manage stress. Find healthy ways to cope with the stressors, such as delegating tasks on  your to-do list.  Practice relaxation techniques. Try deep breathing, yoga, massage and visualization.  Eat regularly. Eating regularly scheduled meals and maintaining a healthy diet might help prevent headaches. Also, drink plenty of fluids.  Follow a regular sleep schedule. Sleep deprivation might contribute to headaches Consider biofeedback. With this mind-body technique, you learn to control certain bodily functions --  such as muscle tension, heart rate and blood pressure -- to prevent headaches or reduce headache pain.    Proceed to emergency room if you experience new or worsening symptoms or symptoms do not resolve, if you have new neurologic symptoms or if headache is severe, or for any concerning symptom.   Provided education and documentation from American headache Society toolbox including articles on: chronic migraine medication overuse headache, chronic migraines, prevention of migraines, behavioral and other nonpharmacologic treatments for headache.    Sarina Ill, MD  [facility] [address] [place], [place] [address]  Phone [phone] Fax [phone]  I spent 30 minutes of face-to-face and non-face-to-face time with patient on the  1. Chronic migraine without aura, with intractable migraine, so stated, with status migrainosus   2. Intractable chronic migraine without aura and with status migrainosus   3. Lumbosacral radiculopathy   4. Cervical radiculopathy   5. Exertional headache   6. OSA (obstructive sleep apnea)    diagnosis.  This included previsit chart review, lab review, study review, order entry, electronic health record documentation, patient education on the different diagnostic and therapeutic options, counseling and coordination of care, risks and benefits of management, compliance, or risk factor reduction

## 2020-12-22 DIAGNOSIS — Z6830 Body mass index (BMI) 30.0-30.9, adult: Secondary | ICD-10-CM | POA: Diagnosis not present

## 2020-12-22 DIAGNOSIS — M2550 Pain in unspecified joint: Secondary | ICD-10-CM | POA: Diagnosis not present

## 2020-12-22 DIAGNOSIS — R5382 Chronic fatigue, unspecified: Secondary | ICD-10-CM | POA: Diagnosis not present

## 2020-12-22 DIAGNOSIS — M0579 Rheumatoid arthritis with rheumatoid factor of multiple sites without organ or systems involvement: Secondary | ICD-10-CM | POA: Diagnosis not present

## 2020-12-22 DIAGNOSIS — M150 Primary generalized (osteo)arthritis: Secondary | ICD-10-CM | POA: Diagnosis not present

## 2020-12-22 DIAGNOSIS — E669 Obesity, unspecified: Secondary | ICD-10-CM | POA: Diagnosis not present

## 2020-12-31 ENCOUNTER — Ambulatory Visit: Payer: Medicare Other | Attending: Physical Medicine and Rehabilitation | Admitting: Physical Therapy

## 2020-12-31 ENCOUNTER — Encounter: Payer: Self-pay | Admitting: Physical Therapy

## 2020-12-31 ENCOUNTER — Other Ambulatory Visit: Payer: Self-pay

## 2020-12-31 DIAGNOSIS — R293 Abnormal posture: Secondary | ICD-10-CM | POA: Diagnosis not present

## 2020-12-31 DIAGNOSIS — R252 Cramp and spasm: Secondary | ICD-10-CM

## 2020-12-31 DIAGNOSIS — R279 Unspecified lack of coordination: Secondary | ICD-10-CM | POA: Diagnosis not present

## 2020-12-31 DIAGNOSIS — R2689 Other abnormalities of gait and mobility: Secondary | ICD-10-CM

## 2020-12-31 DIAGNOSIS — M6281 Muscle weakness (generalized): Secondary | ICD-10-CM | POA: Diagnosis not present

## 2020-12-31 NOTE — Patient Instructions (Signed)
Access Code: [email or web address] URL: [email or web address] Date: 12/31/2020 Prepared by: [place]  Exercises Supine Bridge - 1 x daily - 7 x weekly - 1 sets - 10 reps Standing Hip Abduction with Counter Support - 1 x daily - 7 x weekly - 1 sets - 10 reps Mini Squat with Counter Support - 1 x daily - 7 x weekly - 1 sets - 10 reps Standing Hip Extension with Counter Support - 1 x daily - 7 x weekly - 1 sets - 10 reps Child's Pose Stretch - 1 x daily - 7 x weekly - 1 sets - 3-5 reps - 30-45s holds Cat Cow to Child's Pose - 1 x daily - 7 x weekly - 1 sets - 3-5 reps - 30-45s holds Supine Single Knee to Chest Stretch - 1 x daily - 7 x weekly - 1 sets - 3-5 reps - 30-45s holds  [facility] [address] [place], [place] [address] Phone # [phone] Fax [phone]

## 2020-12-31 NOTE — Therapy (Signed)
[facility] [address], [place], [place], [address] Phone: [phone]   Fax:  [phone]  Physical Therapy Evaluation  Patient Details  Name: Terry Shannon MRN: [id number] Date of Birth: December 08, 1955 Referring Provider (PT): Normajean Glasgow, MD   Encounter Date: 12/31/2020   PT End of Session - 12/31/20 1616     Visit Number 1    Date for PT Re-Evaluation 03/11/21    Authorization Type Medicare    Progress Note Due on Visit 10    PT Start Time [id number]    PT Stop Time 1526    PT Time Calculation (min) 41 min    Activity Tolerance Patient tolerated treatment well;No increased pain    Behavior During Therapy WFL for tasks assessed/performed             Past Medical History:  Diagnosis Date   Allergy    year-round, pt. states   Chronic lower back pain    CMC arthritis, thumb, degenerative [id number]   right   Complication of anesthesia    slow to wake up after gallbladder surgery   Depression    Eczema    ARMS AND HANDS   GERD (gastroesophageal reflux disease)    History of thyroid cancer    Hypothyroidism    Migraines    Osteoarthritis    Overactive bladder    RA (rheumatoid arthritis) ([place])    Sleep apnea    no CPAP use   Squamous cell carcinoma of skin 12/21/2017   in situ-mid chest (CX35FU)   Ulcerative colitis [facility])     Past Surgical History:  Procedure Laterality Date   ABDOMINAL HERNIA REPAIR  123456   periumbilical ventral hernia/incisional hernia   BUNIONECTOMY Right    x 2 more   BUNIONECTOMY Left    BUNIONECTOMY WITH CHILECTOMY Right 12/16/2005   CARPAL TUNNEL RELEASE Right 07/13/2001   CARPAL TUNNEL RELEASE Left 08/10/2001   DILATION AND CURETTAGE OF UTERUS     HAMMER TOE SURGERY Left may 2014   HYSTEROSCOPY WITH D & C  03/01/2004   with exc. of endometrial polyp   KNEE ARTHROSCOPY Right 2013   LAPAROSCOPIC CHOLECYSTECTOMY  11/03/2006   LUMBAR FUSION  03/06/2014   l4  l5    nerve  ablation lumbar      THYROIDECTOMY, PARTIAL Right prior to 2002   THYROIDECTOMY, PARTIAL Left 11/29/2000   and isthmus   TONSILLECTOMY  1961   TOTAL ANKLE REPLACEMENT Left 11/2016   with tendon repair   TOTAL KNEE ARTHROPLASTY Right 04/01/2013   Procedure: RIGHT TOTAL KNEE ARTHROPLASTY;  Surgeon: Alta Corning, MD;  Location: [place];  Service: Orthopedics;  Laterality: Right;    There were no vitals filed for this visit.    Subjective Assessment - 12/31/20 1451     Subjective Pt reports she is now taking something for RA however not listed and unsure of name. Pt has long history with back pain in general and mutliple nerve abalations and has been less active and thinks this has increased her pain.    Pertinent History Lt ankle replacement, mirganes (starting at base of skull on Lt side and ram horn pattern forward), cervical pain, multiple nerve ablations thorughout cervical and lumbar regions    Limitations Sitting;Lifting;Standing;Walking;House hold activities    How long can you sit comfortably? depends on the chair ~45 mins    How long can you stand comfortably? 30 mins    How long can you  walk comfortably? 15 mins    Patient Stated Goals to have less pain    Currently in Pain? Yes    Pain Score 5     Pain Location Back    Pain Orientation Mid;Left;Right    Pain Descriptors / Indicators Aching    Pain Type Chronic pain    Pain Radiating Towards burning pain into Lt leg intermittently    Pain Onset More than a month ago    Pain Frequency Intermittent    Aggravating Factors  mobility    Pain Relieving Factors rest, ice, medications                [facility] PT Assessment - 12/31/20 0001       Assessment   Medical Diagnosis M47.9 (ICD-10-CM) - Spondylosis    Referring Provider (PT) Normajean Glasgow, MD    Onset Date/Surgical Date --   chronic   Prior Therapy has it in the past for back pain      Precautions   Precautions None      Restrictions   Weight Bearing Restrictions  No      Balance Screen   Has the patient fallen in the past 6 months No    Has the patient had a decrease in activity level because of a fear of falling?  No    Is the patient reluctant to leave their home because of a fear of falling?  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   Overall Cognitive Status Within Functional Limits for tasks assessed      Observation/Other Assessments   Focus on Therapeutic Outcomes (FOTO)  42      Sensation   Light Touch Appears Intact      Coordination   Gross Motor Movements are Fluid and Coordinated Yes    Fine Motor Movements are Fluid and Coordinated Yes      Posture/Postural Control   Posture/Postural Control Postural limitations    Postural Limitations Rounded Shoulders;Posterior pelvic tilt      ROM / Strength   AROM / PROM / Strength AROM;Strength      AROM   Overall AROM  Deficits    AROM Assessment Site Cervical;Lumbar;Thoracic    Cervical Flexion WFL    Cervical Extension WFL    Cervical - Right Side Bend limited by 25%    Cervical - Left Side Bend WFL    Cervical - Right Rotation limited by 25%    Cervical - Left Rotation WFL    Lumbar Flexion WFL    Lumbar Extension WFL    Lumbar - Right Side Bend limited by 25%    Lumbar - Left Side Bend WFL    Lumbar - Right Rotation limited by 25%    Lumbar - Left Rotation [facility]    Thoracic Flexion limited by 50%    Thoracic Extension limited by 50%    Thoracic - Right Side Bend limited by 50%    Thoracic - Left Side Bend limited by 50%    Thoracic -  Right Rotation limited by 50%    Thoracic - Left Rotation limited by 50%       Strength   Overall Strength Comments bil hip strength globally 4/5 with exception of Lt hip flexion 4-/5      Palpation   Palpation comment TTP at T10-L3                        Objective measurements completed on examination: See above findings.                 PT Short Term Goals - 12/31/20 1624       PT SHORT TERM GOAL #1   Title pt to be I with HEP    Time 5    Period Weeks    Status New    Target Date 02/04/21      PT SHORT TERM GOAL #2   Title pt to report no more than 5/10 pain with mobility in low back for improved tolerane to activity    Time 5    Period Weeks    Status New    Target Date 02/04/21      PT SHORT TERM GOAL #3   Title pt to report at least 20 on FOTO for improved functional mobility    Baseline 42    Time 5    Period Weeks    Status New    Target Date 02/04/21      PT SHORT TERM GOAL #4   Title pt to demonstrate ability to life 5# from ground level with good mechanics    Time 5    Period Weeks    Status New    Target Date 02/04/21               PT Long Term Goals - 12/31/20 1625       PT LONG TERM GOAL #1   Title pt to demonstrate I with advanced HEP    Time 10    Period Weeks    Status New    Target Date 03/11/21      PT LONG TERM GOAL #2   Title pt to report ability to stand at least 30 mins with no more than 4/10 pain    Time 10    Period Weeks    Status New    Target Date 03/11/21      PT LONG TERM GOAL #3   Title pt to report ability to walk at least 30 mins at a time with no more than 4/10 pain    Time 10    Period Weeks    Status New    Target Date 03/11/21      PT LONG TERM GOAL #4   Title pt to demonstrate bil hip strength to 5/5 globally to improve functional mobility    Time 10    Period Weeks    Status New    Target Date 03/11/21      PT LONG TERM GOAL #5   Title pt to report at least 20 on FOTO for improved functional mobility    Baseline 42    Time 10    Period Weeks     Status New    Target Date 03/11/21                    Plan - 12/31/20 1617     Clinical Impression Statement Pt is 65yo female with past medical history  sleep apnea, rheumatoid arthritis, osteoarthritis, chronic migraines for over 2 decades, hypothyroidism, depression, chronic low back pain. this is a patient I been seeing for several years, chronic migraines and exertional headaches, Pt found to have limited mobility in spinal motion, decreased bil hip strength, decreased core strength, decreased rib mobility, decreased tolerance to standing and walking. pt reports it is diffcult to lift anything at all from ground level and pending weight of items may be able to lift items from chest height and place on different surface, stairs are difficult and carrying items are difficult. Pt would benefit from PT to address impairments found during eval.    Personal Factors and Comorbidities Age;Past/Current Experience;Time since onset of injury/illness/exacerbation;Fitness;Comorbidity 1    Comorbidities migraines, multiple previous nerve ablations, fusion    Examination-Activity Limitations Locomotion Level;Sit;Lift;Stand;Stairs;Caring for Others;Sleep;Squat;Reach Overhead    Examination-Participation Restrictions Cleaning;Community Activity;Driving;Shop;Laundry;Yard Work    [employer]chant [employer] officer Evolving/Moderate complexity    Clinical Decision Making Moderate    Rehab Potential Good    PT Frequency 2x / week    PT Duration Other (comment)   10 weeks   PT Treatment/Interventions ADLs/Self Care Home Management;Aquatic Therapy;Stair training;Functional mobility training;Therapeutic activities;Therapeutic exercise;Balance training;Neuromuscular re-education;Manual techniques;Gait training;Patient/family education;Passive range of motion;Dry needling;Energy conservation;Taping    PT Next Visit Plan global strengthening, stretching, discuss dry needling    PT Home Exercise Plan [email or web address]              Patient will benefit from skilled therapeutic intervention in order to improve the following deficits and impairments:  Difficulty walking, Increased fascial restricitons, Increased muscle spasms, Decreased endurance, Decreased activity tolerance, Pain, Improper body mechanics, Impaired flexibility, Decreased mobility, Decreased strength, Postural dysfunction  Visit Diagnosis: Muscle weakness (generalized) - Plan: PT plan of care cert/re-cert  Other abnormalities of gait and mobility - Plan: PT plan of care cert/re-cert  Cramp and spasm - Plan: PT plan of care cert/re-cert     Problem List Patient Active Problem List   Diagnosis Date Noted   Intolerance of continuous positive airway pressure (CPAP) ventilation 06/17/2020   OSA (obstructive sleep apnea) 05/06/2020   Snoring 05/06/2020   Insomnia secondary to chronic pain 03/24/2020   History of sleep apnea 03/24/2020   Gasping for breath 03/24/2020   Abnormal glucose level 12/16/2019   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   Degeneration of lumbar intervertebral disc 12/16/2019   Hyperlipidemia 12/16/2019   Hypothyroidism 12/16/2019   Insomnia 12/16/2019   Migraine 12/16/2019   Overweight 12/16/2019   Postoperative hypothyroidism 12/16/2019   Vitamin D deficiency 12/16/2019   Hallux rigidus, right foot 12/05/2019   Hammer toe of right foot 12/05/2019   History of left ankle joint replacement 12/05/2019   Aftercare following ankle joint replacement surgery 01/10/2017   Anxiety, mild 12/12/2016   Atypical chest pain 12/12/2016   Cardiac murmur 12/12/2016   GERD (gastroesophageal reflux disease) 12/12/2016   History of thyroid cancer 12/12/2016   Post-traumatic osteoarthritis of left ankle 10/03/2016   Chronic migraine without aura, with intractable migraine, so stated, with status migrainosus 09/26/2015   Joint pain 02/18/2015   Abnormal C-reactive protein 01/08/2015   Radiculopathy 03/06/2014    Osteoarthritis of right knee 04/01/2013   OA (osteoarthritis) 11/01/2011   OAB (overactive bladder)    Ulcerative colitis (HCC)    Cancer (HCC)    Carpal tunnel syndrome    RA (rheumatoid arthritis) ([place])     Stacy Gardner, PT 08/11/224:32 PM   [facility] [address]  [place], [place], [place], [address] Phone: [phone]   Fax:  [phone]  Name: Jemiya Fleurant MRN: [id number] Date of Birth: 1955-09-13

## 2021-01-05 ENCOUNTER — Other Ambulatory Visit: Payer: Self-pay

## 2021-01-05 ENCOUNTER — Ambulatory Visit: Payer: Medicare Other | Admitting: Physical Therapy

## 2021-01-05 DIAGNOSIS — R252 Cramp and spasm: Secondary | ICD-10-CM | POA: Diagnosis not present

## 2021-01-05 DIAGNOSIS — R293 Abnormal posture: Secondary | ICD-10-CM | POA: Diagnosis not present

## 2021-01-05 DIAGNOSIS — M6281 Muscle weakness (generalized): Secondary | ICD-10-CM | POA: Diagnosis not present

## 2021-01-05 DIAGNOSIS — R2689 Other abnormalities of gait and mobility: Secondary | ICD-10-CM | POA: Diagnosis not present

## 2021-01-05 DIAGNOSIS — R279 Unspecified lack of coordination: Secondary | ICD-10-CM

## 2021-01-05 NOTE — Therapy (Signed)
[facility] [address], [place], [place], [address] Phone: [phone]   Fax:  [phone]  Physical Therapy Treatment  Patient Details  Name: Terry Shannon MRN: [id number] Date of Birth: 1955-09-09 Referring Provider (PT): Normajean Glasgow, MD   Encounter Date: 01/05/2021   PT End of Session - 01/05/21 1229     Visit Number 2    Date for PT Re-Evaluation 03/11/21    Authorization Type Medicare    Progress Note Due on Visit 10    PT Start Time [id number]    PT Stop Time 1228    PT Time Calculation (min) 43 min    Activity Tolerance Patient tolerated treatment well;No increased pain    Behavior During Therapy WFL for tasks assessed/performed             Past Medical History:  Diagnosis Date   Allergy    year-round, pt. states   Chronic lower back pain    CMC arthritis, thumb, degenerative [id number]   right   Complication of anesthesia    slow to wake up after gallbladder surgery   Depression    Eczema    ARMS AND HANDS   GERD (gastroesophageal reflux disease)    History of thyroid cancer    Hypothyroidism    Migraines    Osteoarthritis    Overactive bladder    RA (rheumatoid arthritis) ([place])    Sleep apnea    no CPAP use   Squamous cell carcinoma of skin 12/21/2017   in situ-mid chest (CX35FU)   Ulcerative colitis [facility])     Past Surgical History:  Procedure Laterality Date   ABDOMINAL HERNIA REPAIR  123456   periumbilical ventral hernia/incisional hernia   BUNIONECTOMY Right    x 2 more   BUNIONECTOMY Left    BUNIONECTOMY WITH CHILECTOMY Right 12/16/2005   CARPAL TUNNEL RELEASE Right 07/13/2001   CARPAL TUNNEL RELEASE Left 08/10/2001   DILATION AND CURETTAGE OF UTERUS     HAMMER TOE SURGERY Left may 2014   HYSTEROSCOPY WITH D & C  03/01/2004   with exc. of endometrial polyp   KNEE ARTHROSCOPY Right 2013   LAPAROSCOPIC CHOLECYSTECTOMY  11/03/2006   LUMBAR FUSION  03/06/2014   l4  l5    nerve  ablation lumbar      THYROIDECTOMY, PARTIAL Right prior to 2002   THYROIDECTOMY, PARTIAL Left 11/29/2000   and isthmus   TONSILLECTOMY  1961   TOTAL ANKLE REPLACEMENT Left 11/2016   with tendon repair   TOTAL KNEE ARTHROPLASTY Right 04/01/2013   Procedure: RIGHT TOTAL KNEE ARTHROPLASTY;  Surgeon: Alta Corning, MD;  Location: [place];  Service: Orthopedics;  Laterality: Right;    There were no vitals filed for this visit.   Subjective Assessment - 01/05/21 1148     Subjective Pt reports she had some Lt shoulder pain with quad based HEP exercises and reported she forgot to mention this at eval but did have a chronic weakness/disuse but relieved with rest.    Pertinent History Lt ankle replacement, mirganes (starting at base of skull on Lt side and ram horn pattern forward), cervical pain, multiple nerve ablations thorughout cervical and lumbar regions    Limitations Sitting;Lifting;Standing;Walking;House hold activities    How long can you sit comfortably? depends on the chair ~45 mins    How long can you stand comfortably? 30 mins    How long can you walk comfortably? 15 mins    Patient Stated Goals  to have less pain    Currently in Pain? Yes    Pain Score 3     Pain Location Back    Pain Orientation Lower;Mid   beltline area   Pain Descriptors / Indicators Aching    Pain Type Chronic pain    Pain Onset More than a month ago    Pain Frequency Intermittent    Aggravating Factors  mobility    Pain Relieving Factors rest, ice, medications                               [facility] Adult PT Treatment/Exercise - 01/05/21 0001       Exercises   Exercises Lumbar;Knee/Hip      Lumbar Exercises: Standing   Other Standing Lumbar Exercises green band x10 palloffs, attempted with rotations however pt reported irritating at Lt hip rotation halted.    Other Standing Lumbar Exercises rows with green band and TA activations 2x10      Lumbar Exercises: Seated   Hip Flexion on  Ball Strengthening;Both;20 reps;Limitations    Hip Flexion on Ball Limitations 2x10; hip flexion and alternating shoulder flexion      Lumbar Exercises: Supine   Other Supine Lumbar Exercises hooklying lower trunk rotations x10 each way    Other Supine Lumbar Exercises trunk rotation with thoracic opening x10 each with 5s holds      Manual Therapy   Manual Therapy Soft tissue mobilization    Manual therapy comments manual work with hands on and suction cup for Lt and right paraspinals and QL with pt reporting great relief at end of this treatment.                    PT Education - 01/05/21 1228     Education Details pt educated on proper technique with all exercises and updated HEP to improve pt's ability to complete without discomfort and given handout to pt.    Person(s) Educated Patient    Methods Explanation;Tactile cues;Demonstration;Verbal cues;Handout    Comprehension Verbalized understanding;Returned demonstration              PT Short Term Goals - 12/31/20 1624       PT SHORT TERM GOAL #1   Title pt to be I with HEP    Time 5    Period Weeks    Status New    Target Date 02/04/21      PT SHORT TERM GOAL #2   Title pt to report no more than 5/10 pain with mobility in low back for improved tolerane to activity    Time 5    Period Weeks    Status New    Target Date 02/04/21      PT SHORT TERM GOAL #3   Title pt to report at least 5 on FOTO for improved functional mobility    Baseline 42    Time 5    Period Weeks    Status New    Target Date 02/04/21      PT SHORT TERM GOAL #4   Title pt to demonstrate ability to life 5# from ground level with good mechanics    Time 5    Period Weeks    Status New    Target Date 02/04/21               PT Long Term Goals - 12/31/20 1625  PT LONG TERM GOAL #1   Title pt to demonstrate I with advanced HEP    Time 10    Period Weeks    Status New    Target Date 03/11/21      PT LONG TERM GOAL  #2   Title pt to report ability to stand at least 30 mins with no more than 4/10 pain    Time 10    Period Weeks    Status New    Target Date 03/11/21      PT LONG TERM GOAL #3   Title pt to report ability to walk at least 30 mins at a time with no more than 4/10 pain    Time 10    Period Weeks    Status New    Target Date 03/11/21      PT LONG TERM GOAL #4   Title pt to demonstrate bil hip strength to 5/5 globally to improve functional mobility    Time 10    Period Weeks    Status New    Target Date 03/11/21      PT LONG TERM GOAL #5   Title pt to report at least 15 on FOTO for improved functional mobility    Baseline 42    Time 10    Period Weeks    Status New    Target Date 03/11/21                   Plan - 01/05/21 1229     Clinical Impression Statement Pt presenting to clinic reporting she felt some mild pain in low back and Lt paraspinal and able to complete HEP but mild irritation at Lt shoulder with quad positions. HEP updated to improve this for pt and decrease pain and pt given handout for updated HEP. Pt session focused on manual work at [employer] and Rt paraspinals with hands on and suction cup used to improve tissue mobility and decrease pain with good effect as pt reported no pain after this. Pt also directed in core strengthening throughout session with cues for technique.Pt would benefit from PT to address impairments found during eval.    Personal Factors and Comorbidities Age;Past/Current Experience;Time since onset of injury/illness/exacerbation;Fitness;Comorbidity 1    Comorbidities migraines, multiple previous nerve ablations, fusion    Examination-Activity Limitations Locomotion Level;Sit;Lift;Stand;Stairs;Caring for Others;Sleep;Squat;Reach Overhead    Examination-Participation Restrictions Cleaning;Community Activity;Driving;Shop;Laundry;Yard Work    [employer]chant [employer] officer Evolving/Moderate complexity    Clinical Decision Making Moderate     Rehab Potential Good    PT Frequency 2x / week    PT Duration Other (comment)   10 visits   PT Treatment/Interventions ADLs/Self Care Home Management;Aquatic Therapy;Stair training;Functional mobility training;Therapeutic activities;Therapeutic exercise;Balance training;Neuromuscular re-education;Manual techniques;Gait training;Patient/family education;Passive range of motion;Dry needling;Energy conservation;Taping    PT Next Visit Plan global strengthening, stretching, discuss dry needling    PT Home Exercise Plan [email or web address]    Consulted and Agree with Plan of Care Patient             Patient will benefit from skilled therapeutic intervention in order to improve the following deficits and impairments:  Difficulty walking, Increased fascial restricitons, Increased muscle spasms, Decreased endurance, Decreased activity tolerance, Pain, Improper body mechanics, Impaired flexibility, Decreased mobility, Decreased strength, Postural dysfunction  Visit Diagnosis: Muscle weakness (generalized)  Lack of coordination  Other abnormalities of gait and mobility     Problem List Patient Active Problem List   Diagnosis Date Noted   Intolerance of  continuous positive airway pressure (CPAP) ventilation 06/17/2020   OSA (obstructive sleep apnea) 05/06/2020   Snoring 05/06/2020   Insomnia secondary to chronic pain 03/24/2020   History of sleep apnea 03/24/2020   Gasping for breath 03/24/2020   Abnormal glucose level 12/16/2019   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   Degeneration of lumbar intervertebral disc 12/16/2019   Hyperlipidemia 12/16/2019   Hypothyroidism 12/16/2019   Insomnia 12/16/2019   Migraine 12/16/2019   Overweight 12/16/2019   Postoperative hypothyroidism 12/16/2019   Vitamin D deficiency 12/16/2019   Hallux rigidus, right foot 12/05/2019   Hammer toe of right foot 12/05/2019   History of left ankle joint replacement 12/05/2019   Aftercare following ankle joint  replacement surgery 01/10/2017   Anxiety, mild 12/12/2016   Atypical chest pain 12/12/2016   Cardiac murmur 12/12/2016   GERD (gastroesophageal reflux disease) 12/12/2016   History of thyroid cancer 12/12/2016   Post-traumatic osteoarthritis of left ankle 10/03/2016   Chronic migraine without aura, with intractable migraine, so stated, with status migrainosus 09/26/2015   Joint pain 02/18/2015   Abnormal C-reactive protein 01/08/2015   Radiculopathy 03/06/2014   Osteoarthritis of right knee 04/01/2013   OA (osteoarthritis) 11/01/2011   OAB (overactive bladder)    Ulcerative colitis ([place])    Cancer (HCC)    Carpal tunnel syndrome    RA (rheumatoid arthritis) ([place])     Stacy Gardner, PT 01/06/2211:40 PM   [facility] [address] [place], [place], [address] Phone: [phone]   Fax:  [phone]  Name: Terry Shannon MRN: [id number] Date of Birth: November 15, 1955

## 2021-01-08 DIAGNOSIS — Z20822 Contact with and (suspected) exposure to covid-19: Secondary | ICD-10-CM | POA: Diagnosis not present

## 2021-01-13 ENCOUNTER — Ambulatory Visit: Payer: Medicare Other | Admitting: Physical Therapy

## 2021-01-13 ENCOUNTER — Other Ambulatory Visit: Payer: Self-pay

## 2021-01-13 DIAGNOSIS — M6281 Muscle weakness (generalized): Secondary | ICD-10-CM

## 2021-01-13 DIAGNOSIS — R293 Abnormal posture: Secondary | ICD-10-CM | POA: Diagnosis not present

## 2021-01-13 DIAGNOSIS — R2689 Other abnormalities of gait and mobility: Secondary | ICD-10-CM | POA: Diagnosis not present

## 2021-01-13 DIAGNOSIS — R279 Unspecified lack of coordination: Secondary | ICD-10-CM | POA: Diagnosis not present

## 2021-01-13 DIAGNOSIS — R252 Cramp and spasm: Secondary | ICD-10-CM | POA: Diagnosis not present

## 2021-01-13 NOTE — Therapy (Signed)
[facility] [address] [place], [place], [address] Phone: [phone]   Fax:  [phone]  Physical Therapy Treatment  Patient Details  Name: Terry Shannon MRN: [id number] Date of Birth: 1955/11/12 Referring Provider (PT): Normajean Glasgow, MD   Encounter Date: 01/13/2021   PT End of Session - 01/13/21 1524     Visit Number 3    Date for PT Re-Evaluation 03/11/21    Authorization Type Medicare    Authorization - Number of Visits 3    Progress Note Due on Visit 10    PT Start Time [id number]    PT Stop Time 1526    PT Time Calculation (min) 41 min    Activity Tolerance Patient tolerated treatment well;No increased pain    Behavior During Therapy WFL for tasks assessed/performed             Past Medical History:  Diagnosis Date   Allergy    year-round, pt. states   Chronic lower back pain    CMC arthritis, thumb, degenerative [id number]   right   Complication of anesthesia    slow to wake up after gallbladder surgery   Depression    Eczema    ARMS AND HANDS   GERD (gastroesophageal reflux disease)    History of thyroid cancer    Hypothyroidism    Migraines    Osteoarthritis    Overactive bladder    RA (rheumatoid arthritis) ([place])    Sleep apnea    no CPAP use   Squamous cell carcinoma of skin 12/21/2017   in situ-mid chest (CX35FU)   Ulcerative colitis [facility])     Past Surgical History:  Procedure Laterality Date   ABDOMINAL HERNIA REPAIR  123456   periumbilical ventral hernia/incisional hernia   BUNIONECTOMY Right    x 2 more   BUNIONECTOMY Left    BUNIONECTOMY WITH CHILECTOMY Right 12/16/2005   CARPAL TUNNEL RELEASE Right 07/13/2001   CARPAL TUNNEL RELEASE Left 08/10/2001   DILATION AND CURETTAGE OF UTERUS     HAMMER TOE SURGERY Left may 2014   HYSTEROSCOPY WITH D & C  03/01/2004   with exc. of endometrial polyp   KNEE ARTHROSCOPY Right 2013   LAPAROSCOPIC CHOLECYSTECTOMY  11/03/2006   LUMBAR  FUSION  03/06/2014   l4  l5    nerve ablation lumbar      THYROIDECTOMY, PARTIAL Right prior to 2002   THYROIDECTOMY, PARTIAL Left 11/29/2000   and isthmus   TONSILLECTOMY  1961   TOTAL ANKLE REPLACEMENT Left 11/2016   with tendon repair   TOTAL KNEE ARTHROPLASTY Right 04/01/2013   Procedure: RIGHT TOTAL KNEE ARTHROPLASTY;  Surgeon: Alta Corning, MD;  Location: [place];  Service: Orthopedics;  Laterality: Right;    There were no vitals filed for this visit.   Subjective Assessment - 01/13/21 1503     Subjective Pt reports back pain after last visit and thinks it was the mild trunk rotations but now is feeling much better.    Pertinent History Lt ankle replacement, mirganes (starting at base of skull on Lt side and ram horn pattern forward), cervical pain, multiple nerve ablations thorughout cervical and lumbar regions    Limitations Sitting;Lifting;Standing;Walking;House hold activities    How long can you sit comfortably? depends on the chair ~45 mins    How long can you stand comfortably? 30 mins    How long can you walk comfortably? 15 mins    Patient Stated Goals to have  less pain    Currently in Pain? Yes    Pain Score 2     Pain Location Back    Pain Orientation Lower;Mid    Pain Descriptors / Indicators Aching    Pain Type Chronic pain                               [facility] Adult PT Treatment/Exercise - 01/13/21 0001       Exercises   Exercises Lumbar;Knee/Hip      Lumbar Exercises: Stretches   Active Hamstring Stretch Right;Left;2 reps;30 seconds    Hip Flexor Stretch Right;Left;30 seconds    Pelvic Tilt 10 reps    Pelvic Tilt Limitations in hooklying    Other Lumbar Stretch Exercise hooklying x10 overhead reaches bil for improved trunk extension with TA activations      Lumbar Exercises: Aerobic   Nustep 7 mins L1 with heating pad in place for improved back relaxation and mobility                    PT Education - 01/13/21 1503      Education Details Pt educated on proper technique with exercises and breathing pattern to relax back with stretching.    Person(s) Educated Patient    Methods Explanation;Demonstration;Tactile cues;Verbal cues    Comprehension Verbalized understanding;Returned demonstration              PT Short Term Goals - 12/31/20 1624       PT SHORT TERM GOAL #1   Title pt to be I with HEP    Time 5    Period Weeks    Status New    Target Date 02/04/21      PT SHORT TERM GOAL #2   Title pt to report no more than 5/10 pain with mobility in low back for improved tolerane to activity    Time 5    Period Weeks    Status New    Target Date 02/04/21      PT SHORT TERM GOAL #3   Title pt to report at least 56 on FOTO for improved functional mobility    Baseline 42    Time 5    Period Weeks    Status New    Target Date 02/04/21      PT SHORT TERM GOAL #4   Title pt to demonstrate ability to life 5# from ground level with good mechanics    Time 5    Period Weeks    Status New    Target Date 02/04/21               PT Long Term Goals - 12/31/20 1625       PT LONG TERM GOAL #1   Title pt to demonstrate I with advanced HEP    Time 10    Period Weeks    Status New    Target Date 03/11/21      PT LONG TERM GOAL #2   Title pt to report ability to stand at least 30 mins with no more than 4/10 pain    Time 10    Period Weeks    Status New    Target Date 03/11/21      PT LONG TERM GOAL #3   Title pt to report ability to walk at least 30 mins at a time with no more than 4/10 pain    Time  10    Period Weeks    Status New    Target Date 03/11/21      PT LONG TERM GOAL #4   Title pt to demonstrate bil hip strength to 5/5 globally to improve functional mobility    Time 10    Period Weeks    Status New    Target Date 03/11/21      PT LONG TERM GOAL #5   Title pt to report at least 20 on FOTO for improved functional mobility    Baseline 42    Time 10    Period Weeks     Status New    Target Date 03/11/21                   Plan - 01/13/21 1526     Clinical Impression Statement Pt presenting to clinic reporting back pain after last session but was feeling better today. Therapy session focused on gentle mobility, heat and core activations for improved mobilty without increased pain levels. Pt appreciative and denied increased pain at end of session. Pt would benefit from PT to address impairments found during eval.    Personal Factors and Comorbidities Age;Past/Current Experience;Time since onset of injury/illness/exacerbation;Fitness;Comorbidity 1    Comorbidities migraines, multiple previous nerve ablations, fusion    Examination-Activity Limitations Locomotion Level;Sit;Lift;Stand;Stairs;Caring for Others;Sleep;Squat;Reach Overhead    Examination-Participation Restrictions Cleaning;Community Activity;Driving;Shop;Laundry;Yard Work    [employer] Evolving/Moderate complexity    Clinical Decision Making Moderate    Rehab Potential Good    PT Frequency 2x / week    PT Duration Other (comment)    PT Treatment/Interventions ADLs/Self Care Home Management;Aquatic Therapy;Stair training;Functional mobility training;Therapeutic activities;Therapeutic exercise;Balance training;Neuromuscular re-education;Manual techniques;Gait training;Patient/family education;Passive range of motion;Dry needling;Energy conservation;Taping    PT Next Visit Plan global strengthening, stretching, discuss dry needling    PT Home Exercise Plan [email or web address]    Consulted and Agree with Plan of Care Patient             Patient will benefit from skilled therapeutic intervention in order to improve the following deficits and impairments:  Difficulty walking, Increased fascial restricitons, Increased muscle spasms, Decreased endurance, Decreased activity tolerance, Pain, Improper body mechanics, Impaired flexibility, Decreased mobility, Decreased strength,  Postural dysfunction  Visit Diagnosis: Muscle weakness (generalized)  Abnormal posture     Problem List Patient Active Problem List   Diagnosis Date Noted   Intolerance of continuous positive airway pressure (CPAP) ventilation 06/17/2020   OSA (obstructive sleep apnea) 05/06/2020   Snoring 05/06/2020   Insomnia secondary to chronic pain 03/24/2020   History of sleep apnea 03/24/2020   Gasping for breath 03/24/2020   Abnormal glucose level 12/16/2019   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   Degeneration of lumbar intervertebral disc 12/16/2019   Hyperlipidemia 12/16/2019   Hypothyroidism 12/16/2019   Insomnia 12/16/2019   Migraine 12/16/2019   Overweight 12/16/2019   Postoperative hypothyroidism 12/16/2019   Vitamin D deficiency 12/16/2019   Hallux rigidus, right foot 12/05/2019   Hammer toe of right foot 12/05/2019   History of left ankle joint replacement 12/05/2019   Aftercare following ankle joint replacement surgery 01/10/2017   Anxiety, mild 12/12/2016   Atypical chest pain 12/12/2016   Cardiac murmur 12/12/2016   GERD (gastroesophageal reflux disease) 12/12/2016   History of thyroid cancer 12/12/2016   Post-traumatic osteoarthritis of left ankle 10/03/2016   Chronic migraine without aura, with intractable migraine, so stated, with status migrainosus 09/26/2015   Joint pain 02/18/2015   Abnormal  C-reactive protein 01/08/2015   Radiculopathy 03/06/2014   Osteoarthritis of right knee 04/01/2013   OA (osteoarthritis) 11/01/2011   OAB (overactive bladder)    Ulcerative colitis ([facility])    Cancer (H[facility])    Carpal tunnel syndrome    RA (rheumatoid arthritis) ([place])    Stacy Gardner, PT 08/24/223:30 PM   [facility] [address] [place], [place], [address] Phone: [phone]   Fax:  [phone]  Name: Terry Shannon MRN: [id number] Date of Birth: 1955-05-28

## 2021-01-18 ENCOUNTER — Ambulatory Visit: Payer: Medicare Other

## 2021-01-18 ENCOUNTER — Other Ambulatory Visit: Payer: Self-pay

## 2021-01-18 DIAGNOSIS — R2689 Other abnormalities of gait and mobility: Secondary | ICD-10-CM | POA: Diagnosis not present

## 2021-01-18 DIAGNOSIS — R293 Abnormal posture: Secondary | ICD-10-CM | POA: Diagnosis not present

## 2021-01-18 DIAGNOSIS — M6281 Muscle weakness (generalized): Secondary | ICD-10-CM

## 2021-01-18 DIAGNOSIS — R252 Cramp and spasm: Secondary | ICD-10-CM | POA: Diagnosis not present

## 2021-01-18 DIAGNOSIS — R279 Unspecified lack of coordination: Secondary | ICD-10-CM | POA: Diagnosis not present

## 2021-01-18 NOTE — Patient Instructions (Signed)
Access Code: [email or web address] URL: [email or web address] Date: 01/18/2021 Prepared by: Claiborne Billings  Exercises  Seated Hamstring Stretch - 2-3 x daily - 7 x weekly - 1 sets - 3 reps - 20-30 hold Seated Piriformis Stretch with Trunk Bend - 2-3 x daily - 7 x weekly - 1 sets - 3 reps - 20-30 hold

## 2021-01-18 NOTE — Therapy (Signed)
[facility] [address], [place], [place], [address] Phone: [phone]   Fax:  [phone]  Physical Therapy Treatment  Patient Details  Name: Terry Shannon MRN: [id number] Date of Birth: February 23, 1956 Referring Provider (PT): Normajean Glasgow, MD   Encounter Date: 01/18/2021   PT End of Session - 01/18/21 1524     Visit Number 4    Date for PT Re-Evaluation 03/11/21    Authorization Type Medicare    Authorization - Number of Visits 4    Progress Note Due on Visit 10    PT Start Time [id number]    PT Stop Time 1525    PT Time Calculation (min) 37 min    Activity Tolerance Patient tolerated treatment well;No increased pain    Behavior During Therapy WFL for tasks assessed/performed             Past Medical History:  Diagnosis Date   Allergy    year-round, pt. states   Chronic lower back pain    CMC arthritis, thumb, degenerative [id number]   right   Complication of anesthesia    slow to wake up after gallbladder surgery   Depression    Eczema    ARMS AND HANDS   GERD (gastroesophageal reflux disease)    History of thyroid cancer    Hypothyroidism    Migraines    Osteoarthritis    Overactive bladder    RA (rheumatoid arthritis) ([place])    Sleep apnea    no CPAP use   Squamous cell carcinoma of skin 12/21/2017   in situ-mid chest (CX35FU)   Ulcerative colitis [facility])     Past Surgical History:  Procedure Laterality Date   ABDOMINAL HERNIA REPAIR  123456   periumbilical ventral hernia/incisional hernia   BUNIONECTOMY Right    x 2 more   BUNIONECTOMY Left    BUNIONECTOMY WITH CHILECTOMY Right 12/16/2005   CARPAL TUNNEL RELEASE Right 07/13/2001   CARPAL TUNNEL RELEASE Left 08/10/2001   DILATION AND CURETTAGE OF UTERUS     HAMMER TOE SURGERY Left may 2014   HYSTEROSCOPY WITH D & C  03/01/2004   with exc. of endometrial polyp   KNEE ARTHROSCOPY Right 2013   LAPAROSCOPIC CHOLECYSTECTOMY  11/03/2006   LUMBAR  FUSION  03/06/2014   l4  l5    nerve ablation lumbar      THYROIDECTOMY, PARTIAL Right prior to 2002   THYROIDECTOMY, PARTIAL Left 11/29/2000   and isthmus   TONSILLECTOMY  1961   TOTAL ANKLE REPLACEMENT Left 11/2016   with tendon repair   TOTAL KNEE ARTHROPLASTY Right 04/01/2013   Procedure: RIGHT TOTAL KNEE ARTHROPLASTY;  Surgeon: Alta Corning, MD;  Location: [facility];  Service: Orthopedics;  Laterality: Right;    There were no vitals filed for this visit.   Subjective Assessment - 01/18/21 1451     Subjective I did something foolish and picked up my dog.  It is better today since then.    Currently in Pain? Yes    Pain Score 4     Pain Location Back    Pain Orientation Lower    Pain Descriptors / Indicators Aching    Pain Type Chronic pain    Pain Onset More than a month ago    Pain Frequency Intermittent    Aggravating Factors  everything, lifting    Pain Relieving Factors rest, ice, medication, Gabapentin  [facility] Adult PT Treatment/Exercise - 01/18/21 0001       Lumbar Exercises: Stretches   Active Hamstring Stretch Right;Left;2 reps;30 seconds    Single Knee to Chest Stretch 2 reps;20 seconds    Piriformis Stretch 2 reps;20 seconds      Lumbar Exercises: Supine   Bridge 20 reps      Manual Therapy   Manual Therapy Soft tissue mobilization    Manual therapy comments elongation and release to bil low back and gluteals              Trigger Point Dry Needling - 01/18/21 0001     Consent Given? Yes    Education Handout Provided Previously provided    Muscles Treated Back/Hip Thoracic multifidi;Gluteus medius;Gluteus minimus    Gluteus Minimus Response Twitch response elicited;Palpable increased muscle length    Gluteus Medius Response Twitch response elicited;Palpable increased muscle length    Thoracic multifidi response Twitch response elicited;Palpable increased muscle length                  PT  Education - 01/18/21 1458     Education Details Access Code: [email or web address]    Person(s) Educated Patient    Methods Explanation;Demonstration;Handout    Comprehension Returned demonstration;Verbalized understanding              PT Short Term Goals - 12/31/20 1624       PT SHORT TERM GOAL #1   Title pt to be I with HEP    Time 5    Period Weeks    Status New    Target Date 02/04/21      PT SHORT TERM GOAL #2   Title pt to report no more than 5/10 pain with mobility in low back for improved tolerane to activity    Time 5    Period Weeks    Status New    Target Date 02/04/21      PT SHORT TERM GOAL #3   Title pt to report at least 69 on FOTO for improved functional mobility    Baseline 42    Time 5    Period Weeks    Status New    Target Date 02/04/21      PT SHORT TERM GOAL #4   Title pt to demonstrate ability to life 5# from ground level with good mechanics    Time 5    Period Weeks    Status New    Target Date 02/04/21               PT Long Term Goals - 12/31/20 1625       PT LONG TERM GOAL #1   Title pt to demonstrate I with advanced HEP    Time 10    Period Weeks    Status New    Target Date 03/11/21      PT LONG TERM GOAL #2   Title pt to report ability to stand at least 30 mins with no more than 4/10 pain    Time 10    Period Weeks    Status New    Target Date 03/11/21      PT LONG TERM GOAL #3   Title pt to report ability to walk at least 30 mins at a time with no more than 4/10 pain    Time 10    Period Weeks    Status New    Target Date 03/11/21      PT LONG  TERM GOAL #4   Title pt to demonstrate bil hip strength to 5/5 globally to improve functional mobility    Time 10    Period Weeks    Status New    Target Date 03/11/21      PT LONG TERM GOAL #5   Title pt to report at least 46 on FOTO for improved functional mobility    Baseline 42    Time 10    Period Weeks    Status New    Target Date 03/11/21                    Plan - 01/18/21 1501     Clinical Impression Statement Pt reports that she lifted her dog and had to carry him a short distance and this strained her back.  Pt is feeling better today and reports that she didn't do her exercises while her pain was elevated.  PT added gentle seated flexibility to HEP and she was encouraged to perform regularly.  Pt with tension in bil lumbar paraspinals and gluteals and had good twitch response and improved tissue mobility with dry needling and manual therapy.  Pt would benefit from skilled PT to address impairments found during eval.    Rehab Potential Good    PT Frequency 2x / week    PT Duration Other (comment)    PT Treatment/Interventions ADLs/Self Care Home Management;Aquatic Therapy;Stair training;Functional mobility training;Therapeutic activities;Therapeutic exercise;Balance training;Neuromuscular re-education;Manual techniques;Gait training;Patient/family education;Passive range of motion;Dry needling;Energy conservation;Taping    PT Next Visit Plan global strengthening, stretching, assess response to dry needling    PT Home Exercise Plan [email or web address]    Consulted and Agree with Plan of Care Patient             Patient will benefit from skilled therapeutic intervention in order to improve the following deficits and impairments:  Difficulty walking, Increased fascial restricitons, Increased muscle spasms, Decreased endurance, Decreased activity tolerance, Pain, Improper body mechanics, Impaired flexibility, Decreased mobility, Decreased strength, Postural dysfunction  Visit Diagnosis: Muscle weakness (generalized)  Abnormal posture  Cramp and spasm     Problem List Patient Active Problem List   Diagnosis Date Noted   Intolerance of continuous positive airway pressure (CPAP) ventilation 06/17/2020   OSA (obstructive sleep apnea) 05/06/2020   Snoring 05/06/2020   Insomnia secondary to chronic pain 03/24/2020   History of sleep  apnea 03/24/2020   Gasping for breath 03/24/2020   Abnormal glucose level 12/16/2019   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   Degeneration of lumbar intervertebral disc 12/16/2019   Hyperlipidemia 12/16/2019   Hypothyroidism 12/16/2019   Insomnia 12/16/2019   Migraine 12/16/2019   Overweight 12/16/2019   Postoperative hypothyroidism 12/16/2019   Vitamin D deficiency 12/16/2019   Hallux rigidus, right foot 12/05/2019   Hammer toe of right foot 12/05/2019   History of left ankle joint replacement 12/05/2019   Aftercare following ankle joint replacement surgery 01/10/2017   Anxiety, mild 12/12/2016   Atypical chest pain 12/12/2016   Cardiac murmur 12/12/2016   GERD (gastroesophageal reflux disease) 12/12/2016   History of thyroid cancer 12/12/2016   Post-traumatic osteoarthritis of left ankle 10/03/2016   Chronic migraine without aura, with intractable migraine, so stated, with status migrainosus 09/26/2015   Joint pain 02/18/2015   Abnormal C-reactive protein 01/08/2015   Radiculopathy 03/06/2014   Osteoarthritis of right knee 04/01/2013   OA (osteoarthritis) 11/01/2011   OAB (overactive bladder)    Ulcerative colitis ([place])    Cancer ([place])  Carpal tunnel syndrome    RA (rheumatoid arthritis) ([place])     Sigurd Sos, PT 01/18/21 3:30 PM    [facility] [address], [place], [place], [address] Phone: [phone]   Fax:  [phone]  Name: Jeanine Bransom MRN: [id number] Date of Birth: 1955-10-29

## 2021-01-20 ENCOUNTER — Other Ambulatory Visit: Payer: Self-pay

## 2021-01-20 ENCOUNTER — Ambulatory Visit: Payer: Medicare Other

## 2021-01-20 DIAGNOSIS — R2689 Other abnormalities of gait and mobility: Secondary | ICD-10-CM | POA: Diagnosis not present

## 2021-01-20 DIAGNOSIS — R293 Abnormal posture: Secondary | ICD-10-CM

## 2021-01-20 DIAGNOSIS — R252 Cramp and spasm: Secondary | ICD-10-CM | POA: Diagnosis not present

## 2021-01-20 DIAGNOSIS — M6281 Muscle weakness (generalized): Secondary | ICD-10-CM

## 2021-01-20 DIAGNOSIS — R279 Unspecified lack of coordination: Secondary | ICD-10-CM | POA: Diagnosis not present

## 2021-01-20 NOTE — Therapy (Signed)
[facility] [address] [place], [place], [address] Phone: [phone]   Fax:  [phone]  Physical Therapy Treatment  Patient Details  Name: Brooke-Lynn Trust MRN: [id number] Date of Birth: 1956-04-28 Referring Provider (PT): Normajean Glasgow, MD   Encounter Date: 01/20/2021   PT End of Session - 01/20/21 1608     Visit Number 5    Date for PT Re-Evaluation 03/11/21    Authorization Type Medicare    Authorization - Number of Visits 5    Progress Note Due on Visit 10    PT Start Time [id number]    PT Stop Time 1610    PT Time Calculation (min) 39 min    Activity Tolerance Patient tolerated treatment well;No increased pain    Behavior During Therapy WFL for tasks assessed/performed             Past Medical History:  Diagnosis Date   Allergy    year-round, pt. states   Chronic lower back pain    CMC arthritis, thumb, degenerative [id number]   right   Complication of anesthesia    slow to wake up after gallbladder surgery   Depression    Eczema    ARMS AND HANDS   GERD (gastroesophageal reflux disease)    History of thyroid cancer    Hypothyroidism    Migraines    Osteoarthritis    Overactive bladder    RA (rheumatoid arthritis) ([place])    Sleep apnea    no CPAP use   Squamous cell carcinoma of skin 12/21/2017   in situ-mid chest (CX35FU)   Ulcerative colitis [facility])     Past Surgical History:  Procedure Laterality Date   ABDOMINAL HERNIA REPAIR  123456   periumbilical ventral hernia/incisional hernia   BUNIONECTOMY Right    x 2 more   BUNIONECTOMY Left    BUNIONECTOMY WITH CHILECTOMY Right 12/16/2005   CARPAL TUNNEL RELEASE Right 07/13/2001   CARPAL TUNNEL RELEASE Left 08/10/2001   DILATION AND CURETTAGE OF UTERUS     HAMMER TOE SURGERY Left may 2014   HYSTEROSCOPY WITH D & C  03/01/2004   with exc. of endometrial polyp   KNEE ARTHROSCOPY Right 2013   LAPAROSCOPIC CHOLECYSTECTOMY  11/03/2006   LUMBAR  FUSION  03/06/2014   l4  l5    nerve ablation lumbar      THYROIDECTOMY, PARTIAL Right prior to 2002   THYROIDECTOMY, PARTIAL Left 11/29/2000   and isthmus   TONSILLECTOMY  1961   TOTAL ANKLE REPLACEMENT Left 11/2016   with tendon repair   TOTAL KNEE ARTHROPLASTY Right 04/01/2013   Procedure: RIGHT TOTAL KNEE ARTHROPLASTY;  Surgeon: Alta Corning, MD;  Location: [facility];  Service: Orthopedics;  Laterality: Right;    There were no vitals filed for this visit.   Subjective Assessment - 01/20/21 1535     Subjective I felt OK after dry needling.  No major differences.    Currently in Pain? Yes    Pain Score 3     Pain Location Back    Pain Orientation Lower    Pain Descriptors / Indicators Aching    Pain Type Chronic pain    Pain Onset More than a month ago    Pain Frequency Intermittent    Aggravating Factors  everything, lifting    Pain Relieving Factors rest, ice, medication, Gabapentin  [facility] Adult PT Treatment/Exercise - 01/20/21 0001       Lumbar Exercises: Stretches   Active Hamstring Stretch Right;Left;2 reps;30 seconds    Active Hamstring Stretch Limitations supine with strap    Single Knee to Chest Stretch 2 reps;20 seconds    Piriformis Stretch 2 reps;20 seconds      Lumbar Exercises: Aerobic   Nustep 6 mins L2 with heating pad in place for improved back relaxation and mobility      Lumbar Exercises: Supine   Ab Set 20 reps;5 seconds    AB Set Limitations with ball squeeze, then 2x10 with abduction with band (red loop)      Lumbar Exercises: Sidelying   Clam 20 reps    Clam Limitations pain on the Lt so stopped                      PT Short Term Goals - 01/20/21 1539       PT SHORT TERM GOAL #1   Title pt to be I with HEP    Status On-going      PT SHORT TERM GOAL #2   Title pt to report no more than 5/10 pain with mobility in low back for improved tolerane to activity    Baseline up to 6/10     Status On-going      PT SHORT TERM GOAL #4   Title pt to demonstrate ability to life 5# from ground level with good mechanics    Status On-going               PT Long Term Goals - 12/31/20 1625       PT LONG TERM GOAL #1   Title pt to demonstrate I with advanced HEP    Time 10    Period Weeks    Status New    Target Date 03/11/21      PT LONG TERM GOAL #2   Title pt to report ability to stand at least 30 mins with no more than 4/10 pain    Time 10    Period Weeks    Status New    Target Date 03/11/21      PT LONG TERM GOAL #3   Title pt to report ability to walk at least 30 mins at a time with no more than 4/10 pain    Time 10    Period Weeks    Status New    Target Date 03/11/21      PT LONG TERM GOAL #4   Title pt to demonstrate bil hip strength to 5/5 globally to improve functional mobility    Time 10    Period Weeks    Status New    Target Date 03/11/21      PT LONG TERM GOAL #5   Title pt to report at least 50 on FOTO for improved functional mobility    Baseline 42    Time 10    Period Weeks    Status New    Target Date 03/11/21                   Plan - 01/20/21 1609     Clinical Impression Statement Pt reports that she didn't feel much difference after dry needling and reports 3/10 LBP today.   Session focused on lumbopelvic strength and flexibility and pt did well with exercises today without increased pain except for clamshells with Lt in Rt sidelying. Pt required  tactile cues for cocontraction of gluteals and core.   Pt will continue to benefit from skilled PT to address core strength, flexibility and manual to address tissue mobility.    PT Frequency 2x / week    PT Treatment/Interventions ADLs/Self Care Home Management;Aquatic Therapy;Stair training;Functional mobility training;Therapeutic activities;Therapeutic exercise;Balance training;Neuromuscular re-education;Manual techniques;Gait training;Patient/family education;Passive range  of motion;Dry needling;Energy conservation;Taping    PT Next Visit Plan global strengthening, stretching, DN again if pt agrees    PT Home Exercise Plan [email or web address]    Consulted and Agree with Plan of Care Patient             Patient will benefit from skilled therapeutic intervention in order to improve the following deficits and impairments:  Difficulty walking, Increased fascial restricitons, Increased muscle spasms, Decreased endurance, Decreased activity tolerance, Pain, Improper body mechanics, Impaired flexibility, Decreased mobility, Decreased strength, Postural dysfunction  Visit Diagnosis: Muscle weakness (generalized)  Abnormal posture  Cramp and spasm     Problem List Patient Active Problem List   Diagnosis Date Noted   Intolerance of continuous positive airway pressure (CPAP) ventilation 06/17/2020   OSA (obstructive sleep apnea) 05/06/2020   Snoring 05/06/2020   Insomnia secondary to chronic pain 03/24/2020   History of sleep apnea 03/24/2020   Gasping for breath 03/24/2020   Abnormal glucose level 12/16/2019   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   Degeneration of lumbar intervertebral disc 12/16/2019   Hyperlipidemia 12/16/2019   Hypothyroidism 12/16/2019   Insomnia 12/16/2019   Migraine 12/16/2019   Overweight 12/16/2019   Postoperative hypothyroidism 12/16/2019   Vitamin D deficiency 12/16/2019   Hallux rigidus, right foot 12/05/2019   Hammer toe of right foot 12/05/2019   History of left ankle joint replacement 12/05/2019   Aftercare following ankle joint replacement surgery 01/10/2017   Anxiety, mild 12/12/2016   Atypical chest pain 12/12/2016   Cardiac murmur 12/12/2016   GERD (gastroesophageal reflux disease) 12/12/2016   History of thyroid cancer 12/12/2016   Post-traumatic osteoarthritis of left ankle 10/03/2016   Chronic migraine without aura, with intractable migraine, so stated, with status migrainosus 09/26/2015   Joint pain 02/18/2015    Abnormal C-reactive protein 01/08/2015   Radiculopathy 03/06/2014   Osteoarthritis of right knee 04/01/2013   OA (osteoarthritis) 11/01/2011   OAB (overactive bladder)    Ulcerative colitis (HCC)    Cancer (HCC)    Carpal tunnel syndrome    RA (rheumatoid arthritis) ([place])     Sigurd Sos, PT 01/20/21 4:11 PM   [facility] [address], [place], [place], [address] Phone: [phone]   Fax:  [phone]  Name: Cristelle Clason MRN: [id number] Date of Birth: 1956-01-31

## 2021-01-21 DIAGNOSIS — M1712 Unilateral primary osteoarthritis, left knee: Secondary | ICD-10-CM | POA: Diagnosis not present

## 2021-01-21 DIAGNOSIS — Z471 Aftercare following joint replacement surgery: Secondary | ICD-10-CM | POA: Diagnosis not present

## 2021-01-21 DIAGNOSIS — M7062 Trochanteric bursitis, left hip: Secondary | ICD-10-CM | POA: Diagnosis not present

## 2021-01-21 DIAGNOSIS — Z96651 Presence of right artificial knee joint: Secondary | ICD-10-CM | POA: Diagnosis not present

## 2021-01-26 ENCOUNTER — Other Ambulatory Visit: Payer: Self-pay

## 2021-01-26 ENCOUNTER — Ambulatory Visit: Payer: Medicare Other | Attending: Physical Medicine and Rehabilitation

## 2021-01-26 ENCOUNTER — Telehealth: Payer: Self-pay | Admitting: Neurology

## 2021-01-26 DIAGNOSIS — M6281 Muscle weakness (generalized): Secondary | ICD-10-CM | POA: Diagnosis not present

## 2021-01-26 DIAGNOSIS — R252 Cramp and spasm: Secondary | ICD-10-CM | POA: Diagnosis not present

## 2021-01-26 DIAGNOSIS — R293 Abnormal posture: Secondary | ICD-10-CM | POA: Insufficient documentation

## 2021-01-26 NOTE — Therapy (Signed)
[facility] [address] [place], [place], [address] Phone: [phone]   Fax:  [phone]  Physical Therapy Treatment  Patient Details  Name: Terry Shannon MRN: [id number] Date of Birth: 20-Jul-1955 Referring Provider (PT): Normajean Glasgow, MD   Encounter Date: 01/26/2021   PT End of Session - 01/26/21 1225     Visit Number 6    Date for PT Re-Evaluation 03/11/21    Authorization Type Medicare    Authorization - Number of Visits 6    Progress Note Due on Visit 10    PT Start Time 1147    PT Stop Time 1226    PT Time Calculation (min) 39 min    Activity Tolerance Patient tolerated treatment well;No increased pain    Behavior During Therapy WFL for tasks assessed/performed             Past Medical History:  Diagnosis Date   Allergy    year-round, pt. states   Chronic lower back pain    CMC arthritis, thumb, degenerative 123XX123   right   Complication of anesthesia    slow to wake up after gallbladder surgery   Depression    Eczema    ARMS AND HANDS   GERD (gastroesophageal reflux disease)    History of thyroid cancer    Hypothyroidism    Migraines    Osteoarthritis    Overactive bladder    RA (rheumatoid arthritis) ([place])    Sleep apnea    no CPAP use   Squamous cell carcinoma of skin 12/21/2017   in situ-mid chest (CX35FU)   Ulcerative colitis [facility])     Past Surgical History:  Procedure Laterality Date   ABDOMINAL HERNIA REPAIR  123456   periumbilical ventral hernia/incisional hernia   BUNIONECTOMY Right    x 2 more   BUNIONECTOMY Left    BUNIONECTOMY WITH CHILECTOMY Right 12/16/2005   CARPAL TUNNEL RELEASE Right 07/13/2001   CARPAL TUNNEL RELEASE Left 08/10/2001   DILATION AND CURETTAGE OF UTERUS     HAMMER TOE SURGERY Left may 2014   HYSTEROSCOPY WITH D & C  03/01/2004   with exc. of endometrial polyp   KNEE ARTHROSCOPY Right 2013   LAPAROSCOPIC CHOLECYSTECTOMY  11/03/2006   LUMBAR  FUSION  03/06/2014   l4  l5    nerve ablation lumbar      THYROIDECTOMY, PARTIAL Right prior to 2002   THYROIDECTOMY, PARTIAL Left 11/29/2000   and isthmus   TONSILLECTOMY  1961   TOTAL ANKLE REPLACEMENT Left 11/2016   with tendon repair   TOTAL KNEE ARTHROPLASTY Right 04/01/2013   Procedure: RIGHT TOTAL KNEE ARTHROPLASTY;  Surgeon: Alta Corning, MD;  Location: [facility];  Service: Orthopedics;  Laterality: Right;    There were no vitals filed for this visit.   Subjective Assessment - 01/26/21 1148     Subjective I got a shot in my Lt knee and I clenched and then my back hurt for a few days.  It it feeling better now,    Currently in Pain? No/denies   up to 7/10 over the weekend.   Pain Location Back                               [facility] Adult PT Treatment/Exercise - 01/26/21 0001       Lumbar Exercises: Stretches   Active Hamstring Stretch Right;Left;2 reps;30 seconds    Active Hamstring  Stretch Limitations seated    Piriformis Stretch 2 reps;20 seconds      Lumbar Exercises: Aerobic   Nustep 6 mins L2 with heating pad in place for improved back relaxation and mobility      Knee/Hip Exercises: Seated   Abd/Adduction Limitations press into foam roll 5" hold x 10    Sit to Sand 2 sets;10 reps   holding 5# kettle bell     Manual Therapy   Manual Therapy Soft tissue mobilization    Manual therapy comments elongation and release to bil low back and gluteals              Trigger Point Dry Needling - 01/26/21 0001     Consent Given? Yes    Education Handout Provided Previously provided    Muscles Treated Back/Hip Gluteus medius;Gluteus minimus;Lumbar multifidi    Gluteus Minimus Response Twitch response elicited;Palpable increased muscle length    Gluteus Medius Response Twitch response elicited;Palpable increased muscle length    Lumbar multifidi Response Twitch response elicited;Palpable increased muscle length    Thoracic multifidi response --                     PT Short Term Goals - 01/26/21 1155       PT SHORT TERM GOAL #1   Title pt to be I with HEP    Status Achieved      PT SHORT TERM GOAL #2   Title pt to report no more than 5/10 pain with mobility in low back for improved tolerane to activity    Baseline 7/10 over the weekend    Status On-going      PT SHORT TERM GOAL #4   Title pt to demonstrate ability to life 5# from ground level with good mechanics    Status On-going               PT Long Term Goals - 12/31/20 1625       PT LONG TERM GOAL #1   Title pt to demonstrate I with advanced HEP    Time 10    Period Weeks    Status New    Target Date 03/11/21      PT LONG TERM GOAL #2   Title pt to report ability to stand at least 30 mins with no more than 4/10 pain    Time 10    Period Weeks    Status New    Target Date 03/11/21      PT LONG TERM GOAL #3   Title pt to report ability to walk at least 30 mins at a time with no more than 4/10 pain    Time 10    Period Weeks    Status New    Target Date 03/11/21      PT LONG TERM GOAL #4   Title pt to demonstrate bil hip strength to 5/5 globally to improve functional mobility    Time 10    Period Weeks    Status New    Target Date 03/11/21      PT LONG TERM GOAL #5   Title pt to report at least 72 on FOTO for improved functional mobility    Baseline 42    Time 10    Period Weeks    Status New    Target Date 03/11/21                   Plan - 01/26/21 1200  Clinical Impression Statement Pt had a flare-up of LBP due to clenching when receiving a cortisone injection.   Session focused on lumbopelvic strength and flexibility and pt did well with exercises today without increased pain with gentle exercises.  Pt with tension and trigger points in the lumbar spine and gluteals and had good twitch response with dry needling and manual therapy today with reduced tension and improved tissue mobility after.  Pt will benefit next  session from lumbopelvic strength and flexibility.    PT Frequency 2x / week    PT Treatment/Interventions ADLs/Self Care Home Management;Aquatic Therapy;Stair training;Functional mobility training;Therapeutic activities;Therapeutic exercise;Balance training;Neuromuscular re-education;Manual techniques;Gait training;Patient/family education;Passive range of motion;Dry needling;Energy conservation;Taping    PT Next Visit Plan assess response to DN, strength as tolerated, TA activation.  Work on lifting    PT [place] and Agree with Plan of Care Patient             Patient will benefit from skilled therapeutic intervention in order to improve the following deficits and impairments:  Difficulty walking, Increased fascial restricitons, Increased muscle spasms, Decreased endurance, Decreased activity tolerance, Pain, Improper body mechanics, Impaired flexibility, Decreased mobility, Decreased strength, Postural dysfunction  Visit Diagnosis: Abnormal posture  Muscle weakness (generalized)  Cramp and spasm     Problem List Patient Active Problem List   Diagnosis Date Noted   Intolerance of continuous positive airway pressure (CPAP) ventilation 06/17/2020   OSA (obstructive sleep apnea) 05/06/2020   Snoring 05/06/2020   Insomnia secondary to chronic pain 03/24/2020   History of sleep apnea 03/24/2020   Gasping for breath 03/24/2020   Abnormal glucose level 12/16/2019   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   Degeneration of lumbar intervertebral disc 12/16/2019   Hyperlipidemia 12/16/2019   Hypothyroidism 12/16/2019   Insomnia 12/16/2019   Migraine 12/16/2019   Overweight 12/16/2019   Postoperative hypothyroidism 12/16/2019   Vitamin D deficiency 12/16/2019   Hallux rigidus, right foot 12/05/2019   Hammer toe of right foot 12/05/2019   History of left ankle joint replacement 12/05/2019   Aftercare following ankle joint replacement surgery  01/10/2017   Anxiety, mild 12/12/2016   Atypical chest pain 12/12/2016   Cardiac murmur 12/12/2016   GERD (gastroesophageal reflux disease) 12/12/2016   History of thyroid cancer 12/12/2016   Post-traumatic osteoarthritis of left ankle 10/03/2016   Chronic migraine without aura, with intractable migraine, so stated, with status migrainosus 09/26/2015   Joint pain 02/18/2015   Abnormal C-reactive protein 01/08/2015   Radiculopathy 03/06/2014   Osteoarthritis of right knee 04/01/2013   OA (osteoarthritis) 11/01/2011   OAB (overactive bladder)    Ulcerative colitis (HCC)    Cancer (HCC)    Carpal tunnel syndrome    RA (rheumatoid arthritis) ([place])     Sigurd Sos, PT 01/26/21 12:28 PM   [facility] [address], [place], [place], [address] Phone: [phone]   Fax:  [phone]  Name: Terry Shannon MRN: [id number] Date of Birth: 08/03/1955

## 2021-01-26 NOTE — Telephone Encounter (Signed)
Pt called wanting to know if we could send over a referral for sleep apnea implant instead of getting a machine since it is taking so long to receive the machine. Pt requesting a call back.

## 2021-01-26 NOTE — Telephone Encounter (Signed)
Unfortunately pt must try and fail CPAP before they can assess if she is a candidate for inspire. I have reached out to the management team at [employer] to check on status and why she has not been set up. Patient should have been set up by now. I will reach out to the patient once I have heard back from the company.

## 2021-01-26 NOTE — Telephone Encounter (Signed)
Heard from [facility] and the patient was wanting to talk with [place] first. Called the patient back to advise of the information below. There was no answer left a message advising in detail that cpap must be tried and failed first. We would also need to assess if BMI and sleep study meet criteria. Advised the patient to reach out to the company and get set up with the CPAP    "[place] and spoke with patient and she states that she is waiting on a call back from her doctor about potentially having the surgery done vs. getting the CPAP machine. I told the  patient that we would hold the machine for her until we received a call back from her on the decision. Office number and address provided via text to the patient per her request."

## 2021-01-28 ENCOUNTER — Encounter: Payer: Medicare Other | Admitting: Physical Therapy

## 2021-01-28 DIAGNOSIS — M0579 Rheumatoid arthritis with rheumatoid factor of multiple sites without organ or systems involvement: Secondary | ICD-10-CM | POA: Diagnosis not present

## 2021-02-05 ENCOUNTER — Ambulatory Visit: Payer: Medicare Other | Admitting: Physical Therapy

## 2021-02-05 ENCOUNTER — Other Ambulatory Visit: Payer: Self-pay

## 2021-02-05 DIAGNOSIS — R252 Cramp and spasm: Secondary | ICD-10-CM

## 2021-02-05 DIAGNOSIS — R293 Abnormal posture: Secondary | ICD-10-CM

## 2021-02-05 DIAGNOSIS — M6281 Muscle weakness (generalized): Secondary | ICD-10-CM

## 2021-02-05 NOTE — Patient Instructions (Signed)
Access Code: [email or web address] URL: [email or web address] Date: 02/05/2021 Prepared by: Ruben Im  Exercises Supine Bridge - 1 x daily - 7 x weekly - 1 sets - 10 reps Standing Hip Abduction with Counter Support - 1 x daily - 7 x weekly - 1 sets - 10 reps Mini Squat with Counter Support - 1 x daily - 7 x weekly - 1 sets - 10 reps Standing Hip Extension with Counter Support - 1 x daily - 7 x weekly - 1 sets - 10 reps Supine Single Knee to Chest Stretch - 1 x daily - 7 x weekly - 1 sets - 3-5 reps - 30-45s holds Seated Hamstring Stretch - 2-3 x daily - 7 x weekly - 1 sets - 3 reps - 20-30 hold Seated Piriformis Stretch with Trunk Bend - 2-3 x daily - 7 x weekly - 1 sets - 3 reps - 20-30 hold Standing Hip Hinge with [place] - 1 x daily - 7 x weekly - 1 sets - 10 reps Sit to Stand - 1 x daily - 7 x weekly - 1 sets - 10 reps

## 2021-02-05 NOTE — Therapy (Signed)
[facility] [address], [place], [place], [address] Phone: [phone]   Fax:  [phone]  Physical Therapy Treatment  Patient Details  Name: Terry Shannon MRN: [id number] Date of Birth: 15-Sep-1955 Referring Provider (PT): Normajean Glasgow, MD   Encounter Date: 02/05/2021   PT End of Session - 02/05/21 1110     Visit Number 7    Date for PT Re-Evaluation 03/11/21    Authorization Type Medicare    Authorization - Number of Visits 7    Progress Note Due on Visit 10    PT Start Time 0930    PT Stop Time 3790    PT Time Calculation (min) 44 min    Activity Tolerance Patient tolerated treatment well             Past Medical History:  Diagnosis Date   Allergy    year-round, pt. states   Chronic lower back pain    CMC arthritis, thumb, degenerative 06/4095   right   Complication of anesthesia    slow to wake up after gallbladder surgery   Depression    Eczema    ARMS AND HANDS   GERD (gastroesophageal reflux disease)    History of thyroid cancer    Hypothyroidism    Migraines    Osteoarthritis    Overactive bladder    RA (rheumatoid arthritis) ([place])    Sleep apnea    no CPAP use   Squamous cell carcinoma of skin 12/21/2017   in situ-mid chest (CX35FU)   Ulcerative colitis ([place])     Past Surgical History:  Procedure Laterality Date   ABDOMINAL HERNIA REPAIR  35/32/9924   periumbilical ventral hernia/incisional hernia   BUNIONECTOMY Right    x 2 more   BUNIONECTOMY Left    BUNIONECTOMY WITH CHILECTOMY Right 12/16/2005   CARPAL TUNNEL RELEASE Right 07/13/2001   CARPAL TUNNEL RELEASE Left 08/10/2001   DILATION AND CURETTAGE OF UTERUS     HAMMER TOE SURGERY Left may 2014   HYSTEROSCOPY WITH D & C  03/01/2004   with exc. of endometrial polyp   KNEE ARTHROSCOPY Right 2013   LAPAROSCOPIC CHOLECYSTECTOMY  11/03/2006   LUMBAR FUSION  03/06/2014   l4  l5    nerve ablation lumbar      THYROIDECTOMY, PARTIAL  Right prior to 2002   THYROIDECTOMY, PARTIAL Left 11/29/2000   and isthmus   TONSILLECTOMY  1961   TOTAL ANKLE REPLACEMENT Left 11/2016   with tendon repair   TOTAL KNEE ARTHROPLASTY Right 04/01/2013   Procedure: RIGHT TOTAL KNEE ARTHROPLASTY;  Surgeon: Alta Corning, MD;  Location: [facility];  Service: Orthopedics;  Laterality: Right;    There were no vitals filed for this visit.   Subjective Assessment - 02/05/21 0934     Subjective A little improvement in low back.  I have a wild dog I walk and that sets me back but only a short time.  I've had DN 2x for low back and some for my neck about a year ago.    Pertinent History Lt ankle replacement, mirganes (starting at base of skull on Lt side and ram horn pattern forward), cervical pain, multiple nerve ablations thorughout cervical and lumbar regions    Patient Stated Goals to have less pain    Currently in Pain? Yes    Pain Score 1     Pain Location Back    Pain Orientation Lower    Pain Type Chronic pain  [place] Adult PT Treatment/Exercise - 02/05/21 0001       Lumbar Exercises: Aerobic   Nustep 4 min L1      Lumbar Exercises: Standing   Lifting Limitations hip hinge using yard stick for tactile feedback 10x    Other Standing Lumbar Exercises green band rows 5x squat stance; 5x split stance each side    Other Standing Lumbar Exercises green band bil shoulder extension 7x   stopped early secondary to worry it will bother neck     Lumbar Exercises: Seated   Other Seated Lumbar Exercises review of all HEP on [employer] program      Knee/Hip Exercises: Seated   Sit to Sand 5 reps;1 set   holding 5# kettlebell                    PT Education - 02/05/21 1010     Education Details hip hinge with yardstick; sit to stand with weight    Person(s) Educated Patient    Methods Explanation;Demonstration;Handout    Comprehension Returned demonstration;Verbalized understanding               PT Short Term Goals - 02/05/21 1117       PT SHORT TERM GOAL #1   Title pt to be I with HEP    Status Achieved      PT SHORT TERM GOAL #2   Title pt to report no more than 5/10 pain with mobility in low back for improved tolerane to activity    Status Achieved      PT SHORT TERM GOAL #3   Title pt to report at least 6 on FOTO for improved functional mobility    Time 5    Period Weeks    Status On-going      PT SHORT TERM GOAL #4   Title pt to demonstrate ability to life 5# from ground level with good mechanics    Time 5    Period Weeks    Status On-going               PT Long Term Goals - 12/31/20 1625       PT LONG TERM GOAL #1   Title pt to demonstrate I with advanced HEP    Time 10    Period Weeks    Status New    Target Date 03/11/21      PT LONG TERM GOAL #2   Title pt to report ability to stand at least 30 mins with no more than 4/10 pain    Time 10    Period Weeks    Status New    Target Date 03/11/21      PT LONG TERM GOAL #3   Title pt to report ability to walk at least 30 mins at a time with no more than 4/10 pain    Time 10    Period Weeks    Status New    Target Date 03/11/21      PT LONG TERM GOAL #4   Title pt to demonstrate bil hip strength to 5/5 globally to improve functional mobility    Time 10    Period Weeks    Status New    Target Date 03/11/21      PT LONG TERM GOAL #5   Title pt to report at least 50 on FOTO for improved functional mobility    Baseline 42    Time 10    Period Weeks  Status New    Target Date 03/11/21                   Plan - 02/05/21 1111     Clinical Impression Statement The patient reports low intensity back pain today.  Limited performance/repetitions of some exercises secondary to her concern of producing neck pain or knee pain.  Initiated instructed in hip hinging for function and safe movement.  Therapist monitoring response and modifying accordingly.  Partial STGS  met.  May be slower to meet goals secondary to multi region musculoskeletal issues.    Personal Factors and Comorbidities Age;Past/Current Experience;Time since onset of injury/illness/exacerbation;Fitness;Comorbidity 1    Comorbidities migraines, multiple previous nerve ablations, fusion    Examination-Activity Limitations Locomotion Level;Sit;Lift;Stand;Stairs;Caring for Others;Sleep;Squat;Reach Overhead    Examination-Participation Restrictions Cleaning;Community Activity;Driving;Shop;Laundry;Yard Work    Rehab Potential Good    PT Frequency 2x / week    PT Duration Other (comment)    PT Treatment/Interventions ADLs/Self Care Home Management;Aquatic Therapy;Stair training;Functional mobility training;Therapeutic activities;Therapeutic exercise;Balance training;Neuromuscular re-education;Manual techniques;Gait training;Patient/family education;Passive range of motion;Dry needling;Energy conservation;Taping    PT Next Visit Plan recheck FOTO for STG;  review hip hinge with yard stick;  sit to stand with 5#;  TA activation; low reps with strengthening    PT Home Exercise Plan [email or web address]    Recommended Other Services PT referral for hip pain from a different doctor             Patient will benefit from skilled therapeutic intervention in order to improve the following deficits and impairments:  Difficulty walking, Increased fascial restricitons, Increased muscle spasms, Decreased endurance, Decreased activity tolerance, Pain, Improper body mechanics, Impaired flexibility, Decreased mobility, Decreased strength, Postural dysfunction  Visit Diagnosis: Abnormal posture  Muscle weakness (generalized)  Cramp and spasm     Problem List Patient Active Problem List   Diagnosis Date Noted   Intolerance of continuous positive airway pressure (CPAP) ventilation 06/17/2020   OSA (obstructive sleep apnea) 05/06/2020   Snoring 05/06/2020   Insomnia secondary to chronic pain 03/24/2020   History  of sleep apnea 03/24/2020   Gasping for breath 03/24/2020   Abnormal glucose level 12/16/2019   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   Degeneration of lumbar intervertebral disc 12/16/2019   Hyperlipidemia 12/16/2019   Hypothyroidism 12/16/2019   Insomnia 12/16/2019   Migraine 12/16/2019   Overweight 12/16/2019   Postoperative hypothyroidism 12/16/2019   Vitamin D deficiency 12/16/2019   Hallux rigidus, right foot 12/05/2019   Hammer toe of right foot 12/05/2019   History of left ankle joint replacement 12/05/2019   Aftercare following ankle joint replacement surgery 01/10/2017   Anxiety, mild 12/12/2016   Atypical chest pain 12/12/2016   Cardiac murmur 12/12/2016   GERD (gastroesophageal reflux disease) 12/12/2016   History of thyroid cancer 12/12/2016   Post-traumatic osteoarthritis of left ankle 10/03/2016   Chronic migraine without aura, with intractable migraine, so stated, with status migrainosus 09/26/2015   Joint pain 02/18/2015   Abnormal C-reactive protein 01/08/2015   Radiculopathy 03/06/2014   Osteoarthritis of right knee 04/01/2013   OA (osteoarthritis) 11/01/2011   OAB (overactive bladder)    Ulcerative colitis ([place])    Cancer (HCC)    Carpal tunnel syndrome    RA (rheumatoid arthritis) ([place])    Ruben Im, PT 02/05/21 11:20 AM Phone: [phone] Fax: [phone]  Alvera Singh, PT 02/05/2021, 11:19 AM  [facility] [address] [place], [place], [address] Phone: [phone]  Fax:  [phone]  Name: Terry Shannon MRN: [id number] Date of Birth: 03-26-1956

## 2021-02-09 ENCOUNTER — Ambulatory Visit: Payer: Medicare Other

## 2021-02-09 ENCOUNTER — Other Ambulatory Visit: Payer: Self-pay

## 2021-02-09 DIAGNOSIS — M6281 Muscle weakness (generalized): Secondary | ICD-10-CM

## 2021-02-09 DIAGNOSIS — R293 Abnormal posture: Secondary | ICD-10-CM

## 2021-02-09 DIAGNOSIS — R252 Cramp and spasm: Secondary | ICD-10-CM | POA: Diagnosis not present

## 2021-02-09 NOTE — Therapy (Signed)
[facility] [address], [place], [place], [address] Phone: [phone]   Fax:  [phone]  Physical Therapy Treatment  Patient Details  Name: Terry Shannon MRN: [id number] Date of Birth: [birth year] Referring Provider (PT): Normajean Glasgow, MD   Encounter Date: 02/09/2021   PT End of Session - 02/09/21 1156     Visit Number 8    Date for PT Re-Evaluation 03/11/21    Authorization Type Medicare    Progress Note Due on Visit 10    PT Start Time 1104    PT Stop Time 1152    PT Time Calculation (min) 48 min    Activity Tolerance Patient tolerated treatment well    Behavior During Therapy [facility] for tasks assessed/performed             Past Medical History:  Diagnosis Date   Allergy    year-round, pt. states   Chronic lower back pain    CMC arthritis, thumb, degenerative 08/9447   right   Complication of anesthesia    slow to wake up after gallbladder surgery   Depression    Eczema    ARMS AND HANDS   GERD (gastroesophageal reflux disease)    History of thyroid cancer    Hypothyroidism    Migraines    Osteoarthritis    Overactive bladder    RA (rheumatoid arthritis) ([place])    Sleep apnea    no CPAP use   Squamous cell carcinoma of skin 12/21/2017   in situ-mid chest (CX35FU)   Ulcerative colitis ([place])     Past Surgical History:  Procedure Laterality Date   ABDOMINAL HERNIA REPAIR  67/59/1638   periumbilical ventral hernia/incisional hernia   BUNIONECTOMY Right    x 2 more   BUNIONECTOMY Left    BUNIONECTOMY WITH CHILECTOMY Right 12/16/2005   CARPAL TUNNEL RELEASE Right 07/13/2001   CARPAL TUNNEL RELEASE Left 08/10/2001   DILATION AND CURETTAGE OF UTERUS     HAMMER TOE SURGERY Left may 2014   HYSTEROSCOPY WITH D & C  03/01/2004   with exc. of endometrial polyp   KNEE ARTHROSCOPY Right 2013   LAPAROSCOPIC CHOLECYSTECTOMY  11/03/2006   LUMBAR FUSION  03/06/2014   l4  l5    nerve ablation lumbar       THYROIDECTOMY, PARTIAL Right prior to 2002   THYROIDECTOMY, PARTIAL Left 11/29/2000   and isthmus   TONSILLECTOMY  1961   TOTAL ANKLE REPLACEMENT Left 11/2016   with tendon repair   TOTAL KNEE ARTHROPLASTY Right 04/01/2013   Procedure: RIGHT TOTAL KNEE ARTHROPLASTY;  Surgeon: Alta Corning, MD;  Location: [facility];  Service: Orthopedics;  Laterality: Right;    There were no vitals filed for this visit.   Subjective Assessment - 02/09/21 1105     Subjective I'm having Lt lateral hip pain/trochanteric bursitis.  MD gave me new exercises.  I was a little sore after last session but not bad.    Pertinent History Lt ankle replacement, mirganes (starting at base of skull on Lt side and ram horn pattern forward), cervical pain, multiple nerve ablations thorughout cervical and lumbar regions    Patient Stated Goals to have less pain    Currently in Pain? Yes    Pain Score 3    Lt hip 2/10   Pain Location Back    Pain Orientation Lower    Pain Descriptors / Indicators Aching    Pain Type Chronic pain    Pain  Onset More than a month ago    Pain Frequency Intermittent    Aggravating Factors  movement, lifting    Pain Relieving Factors rest, ice, medication, Gabapentin                [facility] PT Assessment - 02/09/21 0001       Assessment   Medical Diagnosis M47.9 (ICD-10-CM) - Spondylosis    Referring Provider (PT) Normajean Glasgow, MD                           [facility] Adult PT Treatment/Exercise - 02/09/21 0001       Lumbar Exercises: Stretches   Active Hamstring Stretch Right;Left;2 reps;30 seconds    Active Hamstring Stretch Limitations seated    Piriformis Stretch 2 reps;20 seconds      Lumbar Exercises: Aerobic   Nustep 8 mins L2      Manual Therapy   Manual Therapy Soft tissue mobilization    Manual therapy comments elongation and release to bil low back and gluteals, Lt quadratus              Trigger Point Dry Needling - 02/09/21 0001     Consent Given?  Yes    Education Handout Provided Previously provided    Muscles Treated Back/Hip Gluteus medius;Gluteus minimus;Lumbar multifidi    Gluteus Minimus Response Twitch response elicited;Palpable increased muscle length    Gluteus Medius Response Twitch response elicited;Palpable increased muscle length    Lumbar multifidi Response Twitch response elicited;Palpable increased muscle length                     PT Short Term Goals - 02/09/21 1109       PT SHORT TERM GOAL #3   Title pt to report at least 89 on FOTO for improved functional mobility    Status On-going      PT SHORT TERM GOAL #4   Title pt to demonstrate ability to life 5# from ground level with good mechanics    Status Achieved               PT Long Term Goals - 12/31/20 1625       PT LONG TERM GOAL #1   Title pt to demonstrate I with advanced HEP    Time 10    Period Weeks    Status New    Target Date 03/11/21      PT LONG TERM GOAL #2   Title pt to report ability to stand at least 30 mins with no more than 4/10 pain    Time 10    Period Weeks    Status New    Target Date 03/11/21      PT LONG TERM GOAL #3   Title pt to report ability to walk at least 30 mins at a time with no more than 4/10 pain    Time 10    Period Weeks    Status New    Target Date 03/11/21      PT LONG TERM GOAL #4   Title pt to demonstrate bil hip strength to 5/5 globally to improve functional mobility    Time 10    Period Weeks    Status New    Target Date 03/11/21      PT LONG TERM GOAL #5   Title pt to report at least 73 on FOTO for improved functional mobility    Baseline 42  Time 10    Period Weeks    Status New    Target Date 03/11/21                   Plan - 02/09/21 1154     Clinical Impression Statement The patient reports low intensity back pain today. Pt had a flare-up after doing a stretch late last week and was not able to perform her exercises during that time.   Pt with some  increased Lt trochanteric pain recently and saw her MD.   Session focused today on gentle mobility and manual/DN to improve tissue mobility.  Pt with good twitch response and improved tissue mobility after manual therapy.  Pt will continue to benefit from skilled PT to address LBP, Lt hip pain and functional endurance.    PT Frequency 2x / week    PT Treatment/Interventions ADLs/Self Care Home Management;Aquatic Therapy;Stair training;Functional mobility training;Therapeutic activities;Therapeutic exercise;Balance training;Neuromuscular re-education;Manual techniques;Gait training;Patient/family education;Passive range of motion;Dry needling;Energy conservation;Taping    PT Next Visit Plan FOTO, TA activation and low rep exercise.  Manual to Lt quadratus, consider DN to this muscle.    PT Home Exercise Plan [email or web address]    Consulted and Agree with Plan of Care Patient             Patient will benefit from skilled therapeutic intervention in order to improve the following deficits and impairments:  Difficulty walking, Increased fascial restricitons, Increased muscle spasms, Decreased endurance, Decreased activity tolerance, Pain, Improper body mechanics, Impaired flexibility, Decreased mobility, Decreased strength, Postural dysfunction  Visit Diagnosis: Abnormal posture  Muscle weakness (generalized)  Cramp and spasm     Problem List Patient Active Problem List   Diagnosis Date Noted   Intolerance of continuous positive airway pressure (CPAP) ventilation 06/17/2020   OSA (obstructive sleep apnea) 05/06/2020   Snoring 05/06/2020   Insomnia secondary to chronic pain 03/24/2020   History of sleep apnea 03/24/2020   Gasping for breath 03/24/2020   Abnormal glucose level 12/16/2019   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   Degeneration of lumbar intervertebral disc 12/16/2019   Hyperlipidemia 12/16/2019   Hypothyroidism 12/16/2019   Insomnia 12/16/2019   Migraine 12/16/2019    Overweight 12/16/2019   Postoperative hypothyroidism 12/16/2019   Vitamin D deficiency 12/16/2019   Hallux rigidus, right foot 12/05/2019   Hammer toe of right foot 12/05/2019   History of left ankle joint replacement 12/05/2019   Aftercare following ankle joint replacement surgery 01/10/2017   Anxiety, mild 12/12/2016   Atypical chest pain 12/12/2016   Cardiac murmur 12/12/2016   GERD (gastroesophageal reflux disease) 12/12/2016   History of thyroid cancer 12/12/2016   Post-traumatic osteoarthritis of left ankle 10/03/2016   Chronic migraine without aura, with intractable migraine, so stated, with status migrainosus 09/26/2015   Joint pain 02/18/2015   Abnormal C-reactive protein 01/08/2015   Radiculopathy 03/06/2014   Osteoarthritis of right knee 04/01/2013   OA (osteoarthritis) 11/01/2011   OAB (overactive bladder)    Ulcerative colitis (HCC)    Cancer (HCC)    Carpal tunnel syndrome    RA (rheumatoid arthritis) ([place])     Sigurd Sos, PT 02/09/21 11:59 AM   [facility] [address], [place], [place], [address] Phone: [phone]   Fax:  [phone]  Name: Terry Shannon MRN: [id number] Date of Birth: [birth year]

## 2021-02-11 ENCOUNTER — Ambulatory Visit: Payer: Medicare Other | Admitting: Physical Therapy

## 2021-02-11 ENCOUNTER — Other Ambulatory Visit: Payer: Self-pay

## 2021-02-11 DIAGNOSIS — H16223 Keratoconjunctivitis sicca, not specified as Sjogren's, bilateral: Secondary | ICD-10-CM | POA: Diagnosis not present

## 2021-02-11 DIAGNOSIS — H04123 Dry eye syndrome of bilateral lacrimal glands: Secondary | ICD-10-CM | POA: Diagnosis not present

## 2021-02-11 DIAGNOSIS — M6281 Muscle weakness (generalized): Secondary | ICD-10-CM | POA: Diagnosis not present

## 2021-02-11 DIAGNOSIS — R252 Cramp and spasm: Secondary | ICD-10-CM

## 2021-02-11 DIAGNOSIS — R293 Abnormal posture: Secondary | ICD-10-CM | POA: Diagnosis not present

## 2021-02-11 NOTE — Therapy (Signed)
[facility] [address], [place], [place], [address] Phone: [phone]   Fax:  [phone]  Physical Therapy Treatment  Patient Details  Name: Terry Shannon MRN: [id number] Date of Birth: [birth year] Referring Provider (PT): Normajean Glasgow, MD   Encounter Date: 02/11/2021   PT End of Session - 02/11/21 2234     Visit Number 9    Date for PT Re-Evaluation 03/11/21    Authorization Type Medicare    Authorization - Number of Visits 8    Progress Note Due on Visit 10    PT Start Time 1527    PT Stop Time 1600   limited ex tolerance   PT Time Calculation (min) 33 min    Activity Tolerance Patient tolerated treatment well             Past Medical History:  Diagnosis Date   Allergy    year-round, pt. states   Chronic lower back pain    CMC arthritis, thumb, degenerative 0/3559   right   Complication of anesthesia    slow to wake up after gallbladder surgery   Depression    Eczema    ARMS AND HANDS   GERD (gastroesophageal reflux disease)    History of thyroid cancer    Hypothyroidism    Migraines    Osteoarthritis    Overactive bladder    RA (rheumatoid arthritis) ([place])    Sleep apnea    no CPAP use   Squamous cell carcinoma of skin 12/21/2017   in situ-mid chest (CX35FU)   Ulcerative colitis ([place])     Past Surgical History:  Procedure Laterality Date   ABDOMINAL HERNIA REPAIR  74/16/3845   periumbilical ventral hernia/incisional hernia   BUNIONECTOMY Right    x 2 more   BUNIONECTOMY Left    BUNIONECTOMY WITH CHILECTOMY Right 12/16/2005   CARPAL TUNNEL RELEASE Right 07/13/2001   CARPAL TUNNEL RELEASE Left 08/10/2001   DILATION AND CURETTAGE OF UTERUS     HAMMER TOE SURGERY Left may 2014   HYSTEROSCOPY WITH D & C  03/01/2004   with exc. of endometrial polyp   KNEE ARTHROSCOPY Right 2013   LAPAROSCOPIC CHOLECYSTECTOMY  11/03/2006   LUMBAR FUSION  03/06/2014   l4  l5    nerve ablation lumbar       THYROIDECTOMY, PARTIAL Right prior to 2002   THYROIDECTOMY, PARTIAL Left 11/29/2000   and isthmus   TONSILLECTOMY  1961   TOTAL ANKLE REPLACEMENT Left 11/2016   with tendon repair   TOTAL KNEE ARTHROPLASTY Right 04/01/2013   Procedure: RIGHT TOTAL KNEE ARTHROPLASTY;  Surgeon: Alta Corning, MD;  Location: [facility];  Service: Orthopedics;  Laterality: Right;    There were no vitals filed for this visit.   Subjective Assessment - 02/11/21 1529     Subjective My hip has been sore.  I've continued to do the stretching and using the yardstick practice.    Pertinent History Lt ankle replacement, mirganes (starting at base of skull on Lt side and ram horn pattern forward), cervical pain, multiple nerve ablations thorughout cervical and lumbar regions    Patient Stated Goals to have less pain    Currently in Pain? Yes    Pain Score 2     Pain Location Back    Pain Type Chronic pain    Multiple Pain Sites Yes    Pain Score 4    Pain Location Hip    Pain Orientation Left  Pain Type Chronic pain                               [facility] Adult PT Treatment/Exercise - 02/11/21 0001       Lumbar Exercises: Aerobic   Nustep 9 mins L2      Lumbar Exercises: Standing   Lifting Limitations hip hinge using yard stick for tactile feedback 10x      Lumbar Exercises: Seated   Sit to Stand Limitations 5# kettlebell sit ups 10x    Other Seated Lumbar Exercises foam roll push down 10x 5 sec hold    Other Seated Lumbar Exercises green band bil shoulder extension 12x      Knee/Hip Exercises: Seated   Clamshell with TheraBand Red   red loop 20x   Sit to Sand 5 reps;1 set   holding 5# kettlebell on cushion in seat 6x                      PT Short Term Goals - 02/09/21 1109       PT SHORT TERM GOAL #3   Title pt to report at least 75 on FOTO for improved functional mobility    Status On-going      PT SHORT TERM GOAL #4   Title pt to demonstrate ability to life 5#  from ground level with good mechanics    Status Achieved               PT Long Term Goals - 12/31/20 1625       PT LONG TERM GOAL #1   Title pt to demonstrate I with advanced HEP    Time 10    Period Weeks    Status New    Target Date 03/11/21      PT LONG TERM GOAL #2   Title pt to report ability to stand at least 30 mins with no more than 4/10 pain    Time 10    Period Weeks    Status New    Target Date 03/11/21      PT LONG TERM GOAL #3   Title pt to report ability to walk at least 30 mins at a time with no more than 4/10 pain    Time 10    Period Weeks    Status New    Target Date 03/11/21      PT LONG TERM GOAL #4   Title pt to demonstrate bil hip strength to 5/5 globally to improve functional mobility    Time 10    Period Weeks    Status New    Target Date 03/11/21      PT LONG TERM GOAL #5   Title pt to report at least 50 on FOTO for improved functional mobility    Baseline 42    Time 10    Period Weeks    Status New    Target Date 03/11/21                   Plan - 02/11/21 2235     Clinical Impression Statement Limited exercise tolerance secondary to general fatigue.  Not limitations due to back or hip today.  Good carryover with hip hinge using yardstick for tactile feedback with min cues for head alignment.  Therapist monitoring response throughout treatment session.    Personal Factors and Comorbidities Age;Past/Current Experience;Time since onset of injury/illness/exacerbation;Fitness;Comorbidity 1  Comorbidities migraines, multiple previous nerve ablations, fusion    Examination-Activity Limitations Locomotion Level;Sit;Lift;Stand;Stairs;Caring for Others;Sleep;Squat;Reach Overhead    Examination-Participation Restrictions Cleaning;Community Activity;Driving;Shop;Laundry;Yard Work    Rehab Potential Good    PT Frequency 2x / week    PT Duration Other (comment)    PT Treatment/Interventions ADLs/Self Care Home Management;Aquatic  Therapy;Stair training;Functional mobility training;Therapeutic activities;Therapeutic exercise;Balance training;Neuromuscular re-education;Manual techniques;Gait training;Patient/family education;Passive range of motion;Dry needling;Energy conservation;Taping    PT Next Visit Plan 10th visit progress note;  FOTO, TA activation and low rep exercise.  Manual to Lt quadratus, consider DN to this muscle.    PT [place]             Patient will benefit from skilled therapeutic intervention in order to improve the following deficits and impairments:  Difficulty walking, Increased fascial restricitons, Increased muscle spasms, Decreased endurance, Decreased activity tolerance, Pain, Improper body mechanics, Impaired flexibility, Decreased mobility, Decreased strength, Postural dysfunction  Visit Diagnosis: Abnormal posture  Muscle weakness (generalized)  Cramp and spasm     Problem List Patient Active Problem List   Diagnosis Date Noted   Intolerance of continuous positive airway pressure (CPAP) ventilation 06/17/2020   OSA (obstructive sleep apnea) 05/06/2020   Snoring 05/06/2020   Insomnia secondary to chronic pain 03/24/2020   History of sleep apnea 03/24/2020   Gasping for breath 03/24/2020   Abnormal glucose level 12/16/2019   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   Degeneration of lumbar intervertebral disc 12/16/2019   Hyperlipidemia 12/16/2019   Hypothyroidism 12/16/2019   Insomnia 12/16/2019   Migraine 12/16/2019   Overweight 12/16/2019   Postoperative hypothyroidism 12/16/2019   Vitamin D deficiency 12/16/2019   Hallux rigidus, right foot 12/05/2019   Hammer toe of right foot 12/05/2019   History of left ankle joint replacement 12/05/2019   Aftercare following ankle joint replacement surgery 01/10/2017   Anxiety, mild 12/12/2016   Atypical chest pain 12/12/2016   Cardiac murmur 12/12/2016   GERD (gastroesophageal reflux disease) 12/12/2016    History of thyroid cancer 12/12/2016   Post-traumatic osteoarthritis of left ankle 10/03/2016   Chronic migraine without aura, with intractable migraine, so stated, with status migrainosus 09/26/2015   Joint pain 02/18/2015   Abnormal C-reactive protein 01/08/2015   Radiculopathy 03/06/2014   Osteoarthritis of right knee 04/01/2013   OA (osteoarthritis) 11/01/2011   OAB (overactive bladder)    Ulcerative colitis ([place])    Cancer (HCC)    Carpal tunnel syndrome    RA (rheumatoid arthritis) ([place])    Ruben Im, PT 02/11/21 10:39 PM Phone: [phone] Fax: [phone]  Alvera Singh, PT 02/11/2021, 10:38 PM  [facility] [address] [place], [place], [address] Phone: [phone]   Fax:  [phone]  Name: Terry Shannon MRN: [id number] Date of Birth: 10/19/1955

## 2021-02-15 ENCOUNTER — Telehealth: Payer: Self-pay

## 2021-02-15 ENCOUNTER — Ambulatory Visit: Payer: Medicare Other

## 2021-02-15 DIAGNOSIS — U071 COVID-19: Secondary | ICD-10-CM | POA: Diagnosis not present

## 2021-02-15 NOTE — Telephone Encounter (Signed)
PT called pt due to missed appointment today.  Left message on voicemail.

## 2021-02-17 ENCOUNTER — Other Ambulatory Visit: Payer: Self-pay

## 2021-02-17 ENCOUNTER — Ambulatory Visit: Payer: Medicare Other

## 2021-02-17 DIAGNOSIS — R293 Abnormal posture: Secondary | ICD-10-CM | POA: Diagnosis not present

## 2021-02-17 DIAGNOSIS — M6281 Muscle weakness (generalized): Secondary | ICD-10-CM

## 2021-02-17 DIAGNOSIS — R252 Cramp and spasm: Secondary | ICD-10-CM | POA: Diagnosis not present

## 2021-02-17 NOTE — Therapy (Signed)
[facility] [address] [place], [place], [address] Phone: [phone]   Fax:  [phone]  Physical Therapy Treatment  Patient Details  Name: Terry Shannon MRN: [id number] Date of Birth: 09/07/1955 Referring Provider (PT): Normajean Glasgow, MD   Encounter Date: 02/17/2021 Progress Note Reporting Period 12/31/20 to 02/17/21  See note below for Objective Data and Assessment of Progress/Goals.       PT End of Session - 02/17/21 1530     Visit Number 10    Date for PT Re-Evaluation 03/11/21    Authorization Type Medicare    Authorization - Number of Visits 10    Progress Note Due on Visit 20    PT Start Time 6384    PT Stop Time 6659    PT Time Calculation (min) 42 min    Activity Tolerance Patient tolerated treatment well    Behavior During Therapy WFL for tasks assessed/performed             Past Medical History:  Diagnosis Date   Allergy    year-round, pt. states   Chronic lower back pain    CMC arthritis, thumb, degenerative 01/3569   right   Complication of anesthesia    slow to wake up after gallbladder surgery   Depression    Eczema    ARMS AND HANDS   GERD (gastroesophageal reflux disease)    History of thyroid cancer    Hypothyroidism    Migraines    Osteoarthritis    Overactive bladder    RA (rheumatoid arthritis) ([place])    Sleep apnea    no CPAP use   Squamous cell carcinoma of skin 12/21/2017   in situ-mid chest (CX35FU)   Ulcerative colitis ([place])     Past Surgical History:  Procedure Laterality Date   ABDOMINAL HERNIA REPAIR  17/79/3903   periumbilical ventral hernia/incisional hernia   BUNIONECTOMY Right    x 2 more   BUNIONECTOMY Left    BUNIONECTOMY WITH CHILECTOMY Right 12/16/2005   CARPAL TUNNEL RELEASE Right 07/13/2001   CARPAL TUNNEL RELEASE Left 08/10/2001   DILATION AND CURETTAGE OF UTERUS     HAMMER TOE SURGERY Left may 2014   HYSTEROSCOPY WITH D & C  03/01/2004   with  exc. of endometrial polyp   KNEE ARTHROSCOPY Right 2013   LAPAROSCOPIC CHOLECYSTECTOMY  11/03/2006   LUMBAR FUSION  03/06/2014   l4  l5    nerve ablation lumbar      THYROIDECTOMY, PARTIAL Right prior to 2002   THYROIDECTOMY, PARTIAL Left 11/29/2000   and isthmus   TONSILLECTOMY  1961   TOTAL ANKLE REPLACEMENT Left 11/2016   with tendon repair   TOTAL KNEE ARTHROPLASTY Right 04/01/2013   Procedure: RIGHT TOTAL KNEE ARTHROPLASTY;  Surgeon: Alta Corning, MD;  Location: [place];  Service: Orthopedics;  Laterality: Right;    There were no vitals filed for this visit.   Subjective Assessment - 02/17/21 1438     Subjective It was tough after last session.  I think that I can only do a little bit of exercise.    Currently in Pain? Yes    Pain Score 2    up to 8/10   Pain Location Back    Pain Orientation Lower;Right;Left    Pain Descriptors / Indicators Aching    Pain Type Chronic pain    Pain Onset More than a month ago    Pain Frequency Intermittent    Aggravating Factors  movement, lifting    Pain Relieving Factors rest, ice, medication, gabapenting    Pain Score 0   up to 5/10   Pain Location Hip    Pain Orientation Left    Pain Descriptors / Indicators Aching    Pain Type Chronic pain                [facility] PT Assessment - 02/17/21 0001       Assessment   Medical Diagnosis M47.9 (ICD-10-CM) - Spondylosis, Lt hip pain    Referring Provider (PT) Normajean Glasgow, MD      Prior Function   Level of Independence Independent    Vocation Retired      Cognition   Overall Cognitive Status Within Functional Limits for tasks assessed                           [facility] Adult PT Treatment/Exercise - 02/17/21 0001       Lumbar Exercises: Stretches   Active Hamstring Stretch Right;Left;2 reps;30 seconds    Active Hamstring Stretch Limitations seated    Piriformis Stretch 2 reps;20 seconds      Lumbar Exercises: Aerobic   Nustep 9 mins L2-      Knee/Hip  Exercises: Stretches   Piriformis Stretch Both;2 reps;20 seconds      Knee/Hip Exercises: Seated   Ball Squeeze 5" hold x 10      Manual Therapy   Manual Therapy Soft tissue mobilization    Manual therapy comments Addaday to Lt gluteals x 10 minutes                       PT Short Term Goals - 02/17/21 1441       PT SHORT TERM GOAL #2   Title pt to report no more than 5/10 pain with mobility in low back for improved tolerane to activity    Status On-going               PT Long Term Goals - 02/17/21 1448       PT LONG TERM GOAL #1   Status On-going      PT LONG TERM GOAL #2   Title pt to report ability to stand at least 30 mins with no more than 4/10 pain    Baseline 20-30 min    Time 10    Period Weeks    Status On-going      PT LONG TERM GOAL #3   Title pt to report ability to walk at least 30 mins at a time with no more than 4/10 pain      PT LONG TERM GOAL #4   Title pt to demonstrate bil hip strength to 5/5 globally to improve functional mobility    Baseline --    Status On-going      PT LONG TERM GOAL #5   Title pt to report at least 50 on FOTO for improved functional mobility    Baseline 42    Status On-going                   Plan - 02/17/21 1459     Clinical Impression Statement Pt reports that she has pain after treatment if she does too much and requested reduced activity today. Pt reports 10% overall improvement in symptoms since the start of care. Pt is able to stand for 20-30 minutes with pain < 4/10 in the low  back and hip.  Pt has not been walking for exercise and PT suggested initiation of walking program with short distances building to longer distances.    PT monitored pt throughout session for technique and pain.  Slow progress toward goals due to chronic nature of condition.  Pt will continue to benefit from skilled PT to address LBP and Lt hip pain and improve endurance for functional tasks.    PT Frequency 2x / week     PT Treatment/Interventions ADLs/Self Care Home Management;Aquatic Therapy;Stair training;Functional mobility training;Therapeutic activities;Therapeutic exercise;Balance training;Neuromuscular re-education;Manual techniques;Gait training;Patient/family education;Passive range of motion;Dry needling;Energy conservation;Taping    PT Next Visit Plan continue gentle strength to tolerance, tissue mobility and manual therapy    PT Home Exercise Plan [email or web address]    Consulted and Agree with Plan of Care Patient             Patient will benefit from skilled therapeutic intervention in order to improve the following deficits and impairments:  Difficulty walking, Increased fascial restricitons, Increased muscle spasms, Decreased endurance, Decreased activity tolerance, Pain, Improper body mechanics, Impaired flexibility, Decreased mobility, Decreased strength, Postural dysfunction  Visit Diagnosis: Muscle weakness (generalized)  Abnormal posture  Cramp and spasm     Problem List Patient Active Problem List   Diagnosis Date Noted   Intolerance of continuous positive airway pressure (CPAP) ventilation 06/17/2020   OSA (obstructive sleep apnea) 05/06/2020   Snoring 05/06/2020   Insomnia secondary to chronic pain 03/24/2020   History of sleep apnea 03/24/2020   Gasping for breath 03/24/2020   Abnormal glucose level 12/16/2019   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   Degeneration of lumbar intervertebral disc 12/16/2019   Hyperlipidemia 12/16/2019   Hypothyroidism 12/16/2019   Insomnia 12/16/2019   Migraine 12/16/2019   Overweight 12/16/2019   Postoperative hypothyroidism 12/16/2019   Vitamin D deficiency 12/16/2019   Hallux rigidus, right foot 12/05/2019   Hammer toe of right foot 12/05/2019   History of left ankle joint replacement 12/05/2019   Aftercare following ankle joint replacement surgery 01/10/2017   Anxiety, mild 12/12/2016   Atypical chest pain 12/12/2016   Cardiac  murmur 12/12/2016   GERD (gastroesophageal reflux disease) 12/12/2016   History of thyroid cancer 12/12/2016   Post-traumatic osteoarthritis of left ankle 10/03/2016   Chronic migraine without aura, with intractable migraine, so stated, with status migrainosus 09/26/2015   Joint pain 02/18/2015   Abnormal C-reactive protein 01/08/2015   Radiculopathy 03/06/2014   Osteoarthritis of right knee 04/01/2013   OA (osteoarthritis) 11/01/2011   OAB (overactive bladder)    Ulcerative colitis (HCC)    Cancer (HCC)    Carpal tunnel syndrome    RA (rheumatoid arthritis) ([place])    Sigurd Sos, PT 02/17/21 3:32 PM   [facility] [address] [place], [place], [address] Phone: [phone]   Fax:  [phone]  Name: Akila Batta MRN: [id number] Date of Birth: 1955-11-05

## 2021-02-22 ENCOUNTER — Telehealth: Payer: Self-pay | Admitting: Neurology

## 2021-02-22 NOTE — Telephone Encounter (Signed)
Patient needs to be seen between 03/25/21-05/23/21. I left a voice mail letting her know I rescheduled her appointment that was on 10/31 to 12/6 to meet the date requirements and to call us back if that won't work for her. Also said to bring machine and power cord to the appt.

## 2021-02-23 ENCOUNTER — Ambulatory Visit: Payer: Medicare Other | Attending: Internal Medicine | Admitting: Physical Therapy

## 2021-02-23 ENCOUNTER — Other Ambulatory Visit: Payer: Self-pay

## 2021-02-23 DIAGNOSIS — R252 Cramp and spasm: Secondary | ICD-10-CM | POA: Diagnosis not present

## 2021-02-23 DIAGNOSIS — R293 Abnormal posture: Secondary | ICD-10-CM | POA: Diagnosis not present

## 2021-02-23 DIAGNOSIS — R2689 Other abnormalities of gait and mobility: Secondary | ICD-10-CM | POA: Insufficient documentation

## 2021-02-23 DIAGNOSIS — M6281 Muscle weakness (generalized): Secondary | ICD-10-CM | POA: Diagnosis not present

## 2021-02-23 NOTE — Therapy (Signed)
[facility] @ [place], [place], [address] Phone:     Fax:     Physical Therapy Treatment  Patient Details  Name: [place] MRN: [id number] Date of Birth: 02/16/1956 Referring Provider (PT): Normajean Glasgow, MD   Encounter Date: 02/23/2021   PT End of Session - 02/23/21 1514     Visit Number 11    Date for PT Re-Evaluation 03/11/21    Authorization Type Medicare    Authorization - Number of Visits 11    Progress Note Due on Visit 20    PT Start Time 1145    PT Stop Time 1228    PT Time Calculation (min) 43 min    Activity Tolerance Patient tolerated treatment well             Past Medical History:  Diagnosis Date   Allergy    year-round, pt. states   Chronic lower back pain    CMC arthritis, thumb, degenerative 09/6312   right   Complication of anesthesia    slow to wake up after gallbladder surgery   Depression    Eczema    ARMS AND HANDS   GERD (gastroesophageal reflux disease)    History of thyroid cancer    Hypothyroidism    Migraines    Osteoarthritis    Overactive bladder    RA (rheumatoid arthritis) ([place])    Sleep apnea    no CPAP use   Squamous cell carcinoma of skin 12/21/2017   in situ-mid chest (CX35FU)   Ulcerative colitis ([place])     Past Surgical History:  Procedure Laterality Date   ABDOMINAL HERNIA REPAIR  97/06/6376   periumbilical ventral hernia/incisional hernia   BUNIONECTOMY Right    x 2 more   BUNIONECTOMY Left    BUNIONECTOMY WITH CHILECTOMY Right 12/16/2005   CARPAL TUNNEL RELEASE Right 07/13/2001   CARPAL TUNNEL RELEASE Left 08/10/2001   DILATION AND CURETTAGE OF UTERUS     HAMMER TOE SURGERY Left may 2014   HYSTEROSCOPY WITH D & C  03/01/2004   with exc. of endometrial polyp   KNEE ARTHROSCOPY Right 2013   LAPAROSCOPIC CHOLECYSTECTOMY  11/03/2006   LUMBAR FUSION  03/06/2014   l4  l5    nerve ablation lumbar      THYROIDECTOMY, PARTIAL Right prior to 2002    THYROIDECTOMY, PARTIAL Left 11/29/2000   and isthmus   TONSILLECTOMY  1961   TOTAL ANKLE REPLACEMENT Left 11/2016   with tendon repair   TOTAL KNEE ARTHROPLASTY Right 04/01/2013   Procedure: RIGHT TOTAL KNEE ARTHROPLASTY;  Surgeon: Alta Corning, MD;  Location: [facility];  Service: Orthopedics;  Laterality: Right;    There were no vitals filed for this visit.   Subjective Assessment - 02/23/21 1151     Subjective Did fine after last session.  I can't do a lot of activity in one session.    Pertinent History Lt ankle replacement, mirganes (starting at base of skull on Lt side and ram horn pattern forward), cervical pain, multiple nerve ablations thorughout cervical and lumbar regions    Currently in Pain? Yes    Pain Score 2     Pain Location Back    Pain Orientation Right;Lower    Pain Type Chronic pain                               [facility] Adult  PT Treatment/Exercise - 02/23/21 0001       Lumbar Exercises: Stretches   Active Hamstring Stretch Right;Left;2 reps;30 seconds    Active Hamstring Stretch Limitations seated    Piriformis Stretch 2 reps;20 seconds      Lumbar Exercises: Aerobic   Nustep 10 mins L2-      Lumbar Exercises: Seated   Sit to Stand 10 reps    Sit to Stand Limitations 5# kettlebell   mat table + foam   Other Seated Lumbar Exercises 5# kettlebell sit ups 10x in a straight chair      Manual Therapy   Manual Therapy Soft tissue mobilization    Manual therapy comments Addaday to Lt gluteals and right lumbar paraspinals in sidelying  x 10 minutes                       PT Short Term Goals - 02/17/21 1441       PT SHORT TERM GOAL #2   Title pt to report no more than 5/10 pain with mobility in low back for improved tolerane to activity    Status On-going               PT Long Term Goals - 02/17/21 1448       PT LONG TERM GOAL #1   Status On-going      PT LONG TERM GOAL #2   Title pt to report ability to stand  at least 30 mins with no more than 4/10 pain    Baseline 20-30 min    Time 10    Period Weeks    Status On-going      PT LONG TERM GOAL #3   Title pt to report ability to walk at least 30 mins at a time with no more than 4/10 pain      PT LONG TERM GOAL #4   Title pt to demonstrate bil hip strength to 5/5 globally to improve functional mobility    Baseline --    Status On-going      PT LONG TERM GOAL #5   Title pt to report at least 50 on FOTO for improved functional mobility    Baseline 42    Status On-going                   Plan - 02/23/21 1515     Clinical Impression Statement Treatment kept at low intensity secondary to patient's previous report of "overdoing it."  Her back and hip pain level is no worse with these exercises.   Therapist monitoring response throughout session and modifying based on response.  Will continue with a slower progression given chronic nature of condition.    Personal Factors and Comorbidities Age;Past/Current Experience;Time since onset of injury/illness/exacerbation;Fitness;Comorbidity 1    Comorbidities migraines, multiple previous nerve ablations, fusion    Examination-Activity Limitations Locomotion Level;Sit;Lift;Stand;Stairs;Caring for Others;Sleep;Squat;Reach Overhead    Rehab Potential Good    PT Frequency 2x / week    PT Duration Other (comment)    PT Treatment/Interventions ADLs/Self Care Home Management;Aquatic Therapy;Stair training;Functional mobility training;Therapeutic activities;Therapeutic exercise;Balance training;Neuromuscular re-education;Manual techniques;Gait training;Patient/family education;Passive range of motion;Dry needling;Energy conservation;Taping    PT Next Visit Plan continue gentle strength (lower level) to tolerance, tissue mobility and manual therapy    PT [place]             Patient will benefit from skilled therapeutic intervention in order to improve the following deficits and  impairments:  Difficulty walking, Increased fascial restricitons, Increased muscle spasms, Decreased endurance, Decreased activity tolerance, Pain, Improper body mechanics, Impaired flexibility, Decreased mobility, Decreased strength, Postural dysfunction  Visit Diagnosis: Muscle weakness (generalized)  Abnormal posture  Cramp and spasm     Problem List Patient Active Problem List   Diagnosis Date Noted   Intolerance of continuous positive airway pressure (CPAP) ventilation 06/17/2020   OSA (obstructive sleep apnea) 05/06/2020   Snoring 05/06/2020   Insomnia secondary to chronic pain 03/24/2020   History of sleep apnea 03/24/2020   Gasping for breath 03/24/2020   Abnormal glucose level 12/16/2019   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   Degeneration of lumbar intervertebral disc 12/16/2019   Hyperlipidemia 12/16/2019   Hypothyroidism 12/16/2019   Insomnia 12/16/2019   Migraine 12/16/2019   Overweight 12/16/2019   Postoperative hypothyroidism 12/16/2019   Vitamin D deficiency 12/16/2019   Hallux rigidus, right foot 12/05/2019   Hammer toe of right foot 12/05/2019   History of left ankle joint replacement 12/05/2019   Aftercare following ankle joint replacement surgery 01/10/2017   Anxiety, mild 12/12/2016   Atypical chest pain 12/12/2016   Cardiac murmur 12/12/2016   GERD (gastroesophageal reflux disease) 12/12/2016   History of thyroid cancer 12/12/2016   Post-traumatic osteoarthritis of left ankle 10/03/2016   Chronic migraine without aura, with intractable migraine, so stated, with status migrainosus 09/26/2015   Joint pain 02/18/2015   Abnormal C-reactive protein 01/08/2015   Radiculopathy 03/06/2014   Osteoarthritis of right knee 04/01/2013   OA (osteoarthritis) 11/01/2011   OAB (overactive bladder)    Ulcerative colitis ([place])    Cancer ([place])    Carpal tunnel syndrome    RA (rheumatoid arthritis) ([place])    Ruben Im, PT 02/23/21 3:20 PM Phone:  [phone] Fax: [phone]  Alvera Singh, PT 02/23/2021, 3:19 PM  [facility] @ [place], [place], [address] Phone:     Fax:     Name: Jahnasia T[place] MRN: [id number] Date of Birth: [birth year]

## 2021-02-25 ENCOUNTER — Encounter: Payer: Medicare Other | Admitting: Physical Therapy

## 2021-03-02 ENCOUNTER — Other Ambulatory Visit: Payer: Self-pay

## 2021-03-02 ENCOUNTER — Ambulatory Visit: Payer: Medicare Other

## 2021-03-02 DIAGNOSIS — M6281 Muscle weakness (generalized): Secondary | ICD-10-CM | POA: Diagnosis not present

## 2021-03-02 DIAGNOSIS — R2689 Other abnormalities of gait and mobility: Secondary | ICD-10-CM | POA: Diagnosis not present

## 2021-03-02 DIAGNOSIS — R293 Abnormal posture: Secondary | ICD-10-CM | POA: Diagnosis not present

## 2021-03-02 DIAGNOSIS — R252 Cramp and spasm: Secondary | ICD-10-CM | POA: Diagnosis not present

## 2021-03-02 NOTE — Therapy (Signed)
[facility] @ [place], [place], [address] Phone: [phone]   Fax:  [phone]  Physical Therapy Treatment  Patient Details  Name: Terry [place] MRN: [id number] Date of Birth: November 16, 1955 Referring Provider (PT): Normajean Glasgow, MD   Encounter Date: 03/02/2021   PT End of Session - 03/02/21 1308     Visit Number 12    Date for PT Re-Evaluation 03/11/21    Authorization Type Medicare    Authorization - Number of Visits 12    Progress Note Due on Visit 20    PT Start Time 6301    PT Stop Time 1308    PT Time Calculation (min) 38 min    Activity Tolerance Patient tolerated treatment well    Behavior During Therapy [facility] for tasks assessed/performed             Past Medical History:  Diagnosis Date   Allergy    year-round, pt. states   Chronic lower back pain    CMC arthritis, thumb, degenerative 10/107   right   Complication of anesthesia    slow to wake up after gallbladder surgery   Depression    Eczema    ARMS AND HANDS   GERD (gastroesophageal reflux disease)    History of thyroid cancer    Hypothyroidism    Migraines    Osteoarthritis    Overactive bladder    RA (rheumatoid arthritis) ([place])    Sleep apnea    no CPAP use   Squamous cell carcinoma of skin 12/21/2017   in situ-mid chest (CX35FU)   Ulcerative colitis ([place])     Past Surgical History:  Procedure Laterality Date   ABDOMINAL HERNIA REPAIR  32/35/5732   periumbilical ventral hernia/incisional hernia   BUNIONECTOMY Right    x 2 more   BUNIONECTOMY Left    BUNIONECTOMY WITH CHILECTOMY Right 12/16/2005   CARPAL TUNNEL RELEASE Right 07/13/2001   CARPAL TUNNEL RELEASE Left 08/10/2001   DILATION AND CURETTAGE OF UTERUS     HAMMER TOE SURGERY Left may 2014   HYSTEROSCOPY WITH D & C  03/01/2004   with exc. of endometrial polyp   KNEE ARTHROSCOPY Right 2013   LAPAROSCOPIC CHOLECYSTECTOMY  11/03/2006   LUMBAR FUSION  03/06/2014    l4  l5    nerve ablation lumbar      THYROIDECTOMY, PARTIAL Right prior to 2002   THYROIDECTOMY, PARTIAL Left 11/29/2000   and isthmus   TONSILLECTOMY  1961   TOTAL ANKLE REPLACEMENT Left 11/2016   with tendon repair   TOTAL KNEE ARTHROPLASTY Right 04/01/2013   Procedure: RIGHT TOTAL KNEE ARTHROPLASTY;  Surgeon: Alta Corning, MD;  Location: [facility];  Service: Orthopedics;  Laterality: Right;    There were no vitals filed for this visit.   Subjective Assessment - 03/02/21 1230     Subjective I'm feeling better since I am getting over my cold.  I can see an improvement in my back symptoms.  I am able to do more normal things with less pain.    Pertinent History Lt ankle replacement, mirganes (starting at base of skull on Lt side and ram horn pattern forward), cervical pain, multiple nerve ablations thorughout cervical and lumbar regions    Currently in Pain? Yes    Pain Score 2     Pain Location Back    Pain Orientation Right;Lower    Pain Descriptors / Indicators Aching    Pain Type Chronic pain  Pain Onset More than a month ago    Pain Frequency Intermittent    Aggravating Factors  movement, lifting    Pain Relieving Factors rest, ice, medication, Gabapentin                               [facility] Adult PT Treatment/Exercise - 03/02/21 0001       Lumbar Exercises: Stretches   Active Hamstring Stretch Right;Left;2 reps;30 seconds    Active Hamstring Stretch Limitations seated    Piriformis Stretch 2 reps;20 seconds      Lumbar Exercises: Aerobic   Nustep 10 mins L2-PT present to discuss progress.      Lumbar Exercises: Standing   Shoulder Extension Strengthening;20 reps;Theraband    Theraband Level (Shoulder Extension) Level 2 (Red)      Lumbar Exercises: Seated   Sit to Stand 10 reps    Sit to Stand Limitations 5# kettlebell   mat table + foam     Knee/Hip Exercises: Standing   Other Standing Knee Exercises weight shifting 3 ways on balance pad-  abdominal bracing x1 min each      Manual Therapy   Manual Therapy Soft tissue mobilization    Manual therapy comments Addaday to Lt gluteals and right lumbar paraspinals in sidelying  x 10 minutes                       PT Short Term Goals - 02/17/21 1441       PT SHORT TERM GOAL #2   Title pt to report no more than 5/10 pain with mobility in low back for improved tolerane to activity    Status On-going               PT Long Term Goals - 02/17/21 1448       PT LONG TERM GOAL #1   Status On-going      PT LONG TERM GOAL #2   Title pt to report ability to stand at least 30 mins with no more than 4/10 pain    Baseline 20-30 min    Time 10    Period Weeks    Status On-going      PT LONG TERM GOAL #3   Title pt to report ability to walk at least 30 mins at a time with no more than 4/10 pain      PT LONG TERM GOAL #4   Title pt to demonstrate bil hip strength to 5/5 globally to improve functional mobility    Baseline --    Status On-going      PT LONG TERM GOAL #5   Title pt to report at least 50 on FOTO for improved functional mobility    Baseline 42    Status On-going                   Plan - 03/02/21 1310     Clinical Impression Statement Treatment kept at low intensity secondary to patient's previous report of increased pain when overdoing the exercise.   Pt did well with all exercises today.    Therapist monitoring response throughout session and modifying based on response.  Will continue with a slower progression given chronic nature of condition.    PT Frequency 2x / week    PT Treatment/Interventions ADLs/Self Care Home Management;Aquatic Therapy;Stair training;Functional mobility training;Therapeutic activities;Therapeutic exercise;Balance training;Neuromuscular re-education;Manual techniques;Gait training;Patient/family education;Passive range of motion;Dry needling;Energy  conservation;Taping    PT Next Visit Plan continue gentle  strength (lower level) to tolerance, tissue mobility and manual therapy    PT Home Exercise Plan [email or web address]    Consulted and Agree with Plan of Care Patient             Patient will benefit from skilled therapeutic intervention in order to improve the following deficits and impairments:  Difficulty walking, Increased fascial restricitons, Increased muscle spasms, Decreased endurance, Decreased activity tolerance, Pain, Improper body mechanics, Impaired flexibility, Decreased mobility, Decreased strength, Postural dysfunction  Visit Diagnosis: Muscle weakness (generalized)  Abnormal posture  Cramp and spasm     Problem List Patient Active Problem List   Diagnosis Date Noted   Intolerance of continuous positive airway pressure (CPAP) ventilation 06/17/2020   OSA (obstructive sleep apnea) 05/06/2020   Snoring 05/06/2020   Insomnia secondary to chronic pain 03/24/2020   History of sleep apnea 03/24/2020   Gasping for breath 03/24/2020   Abnormal glucose level 12/16/2019   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   Degeneration of lumbar intervertebral disc 12/16/2019   Hyperlipidemia 12/16/2019   Hypothyroidism 12/16/2019   Insomnia 12/16/2019   Migraine 12/16/2019   Overweight 12/16/2019   Postoperative hypothyroidism 12/16/2019   Vitamin D deficiency 12/16/2019   Hallux rigidus, right foot 12/05/2019   Hammer toe of right foot 12/05/2019   History of left ankle joint replacement 12/05/2019   Aftercare following ankle joint replacement surgery 01/10/2017   Anxiety, mild 12/12/2016   Atypical chest pain 12/12/2016   Cardiac murmur 12/12/2016   GERD (gastroesophageal reflux disease) 12/12/2016   History of thyroid cancer 12/12/2016   Post-traumatic osteoarthritis of left ankle 10/03/2016   Chronic migraine without aura, with intractable migraine, so stated, with status migrainosus 09/26/2015   Joint pain 02/18/2015   Abnormal C-reactive protein 01/08/2015   Radiculopathy  03/06/2014   Osteoarthritis of right knee 04/01/2013   OA (osteoarthritis) 11/01/2011   OAB (overactive bladder)    Ulcerative colitis ([place])    Cancer ([facility])    Carpal tunnel syndrome    RA (rheumatoid arthritis) ([place])    Sigurd Sos, PT 03/02/21 1:16 PM   [facility] @ [place], [place], [address] Phone: [phone]   Fax:  [phone]  Name: Xzandria Clevinger MRN: [id number] Date of Birth: 03-17-1956

## 2021-03-04 ENCOUNTER — Ambulatory Visit: Payer: Medicare Other | Admitting: Physical Therapy

## 2021-03-04 ENCOUNTER — Other Ambulatory Visit: Payer: Self-pay

## 2021-03-04 DIAGNOSIS — M6281 Muscle weakness (generalized): Secondary | ICD-10-CM

## 2021-03-04 DIAGNOSIS — R252 Cramp and spasm: Secondary | ICD-10-CM | POA: Diagnosis not present

## 2021-03-04 DIAGNOSIS — R293 Abnormal posture: Secondary | ICD-10-CM | POA: Diagnosis not present

## 2021-03-04 DIAGNOSIS — R2689 Other abnormalities of gait and mobility: Secondary | ICD-10-CM | POA: Diagnosis not present

## 2021-03-04 NOTE — Therapy (Signed)
[facility] @ [place], [place], [address] Phone: [phone]   Fax:  [phone]  Physical Therapy Treatment  Patient Details  Name: Terry [place] MRN: [id number] Date of Birth: 04/12/1956 Referring Provider (PT): Normajean Glasgow, MD   Encounter Date: 03/04/2021   PT End of Session - 03/04/21 1101     Visit Number 13    Date for PT Re-Evaluation 03/11/21    Authorization Type Medicare    Authorization - Number of Visits 13    Progress Note Due on Visit 20    PT Start Time 1058    PT Stop Time 1140    PT Time Calculation (min) 42 min    Activity Tolerance Patient tolerated treatment well             Past Medical History:  Diagnosis Date   Allergy    year-round, pt. states   Chronic lower back pain    CMC arthritis, thumb, degenerative 11/3708   right   Complication of anesthesia    slow to wake up after gallbladder surgery   Depression    Eczema    ARMS AND HANDS   GERD (gastroesophageal reflux disease)    History of thyroid cancer    Hypothyroidism    Migraines    Osteoarthritis    Overactive bladder    RA (rheumatoid arthritis) ([place])    Sleep apnea    no CPAP use   Squamous cell carcinoma of skin 12/21/2017   in situ-mid chest (CX35FU)   Ulcerative colitis ([place])     Past Surgical History:  Procedure Laterality Date   ABDOMINAL HERNIA REPAIR  62/69/4854   periumbilical ventral hernia/incisional hernia   BUNIONECTOMY Right    x 2 more   BUNIONECTOMY Left    BUNIONECTOMY WITH CHILECTOMY Right 12/16/2005   CARPAL TUNNEL RELEASE Right 07/13/2001   CARPAL TUNNEL RELEASE Left 08/10/2001   DILATION AND CURETTAGE OF UTERUS     HAMMER TOE SURGERY Left may 2014   HYSTEROSCOPY WITH D & C  03/01/2004   with exc. of endometrial polyp   KNEE ARTHROSCOPY Right 2013   LAPAROSCOPIC CHOLECYSTECTOMY  11/03/2006   LUMBAR FUSION  03/06/2014   l4  l5    nerve ablation lumbar      THYROIDECTOMY,  PARTIAL Right prior to 2002   THYROIDECTOMY, PARTIAL Left 11/29/2000   and isthmus   TONSILLECTOMY  1961   TOTAL ANKLE REPLACEMENT Left 11/2016   with tendon repair   TOTAL KNEE ARTHROPLASTY Right 04/01/2013   Procedure: RIGHT TOTAL KNEE ARTHROPLASTY;  Surgeon: Alta Corning, MD;  Location: [facility];  Service: Orthopedics;  Laterality: Right;    There were no vitals filed for this visit.   Subjective Assessment - 03/04/21 1058     Subjective The hip is still an issue.   Bothers me after standing counter ex's.    Pertinent History Lt ankle replacement, mirganes (starting at base of skull on Lt side and ram horn pattern forward), cervical pain, multiple nerve ablations thorughout cervical and lumbar regions    Limitations Sitting;Lifting;Standing;Walking;House hold activities    How long can you sit comfortably? depends on the chair ~45 mins    Currently in Pain? Yes    Pain Score 2     Pain Location Hip    Pain Orientation Left;Posterior    Pain Type Chronic pain  [facility] Adult PT Treatment/Exercise - 03/04/21 0001       Lumbar Exercises: Stretches   Active Hamstring Stretch Right;Left;2 reps;30 seconds    Active Hamstring Stretch Limitations seated    Piriformis Stretch 2 reps;20 seconds      Lumbar Exercises: Aerobic   Nustep 11:30 mins L2-PT present to discuss progress.      Lumbar Exercises: Machines for Strengthening   Leg Press seat 7 upright back 50# 10x bil;   "I love this"     Lumbar Exercises: Standing   Row Strengthening;Both;10 reps    Theraband Level (Row) Level 2 (Red)    Shoulder Extension Strengthening;Theraband;10 reps    Theraband Level (Shoulder Extension) Level 2 (Red)      Lumbar Exercises: Seated   Other Seated Lumbar Exercises 5# kettlebell sit ups 10x2      Manual Therapy   Manual Therapy Soft tissue mobilization    Manual therapy comments Addaday to Lt gluteals/piriformis in sidelying  x 8 minutes                        PT Short Term Goals - 02/17/21 1441       PT SHORT TERM GOAL #2   Title pt to report no more than 5/10 pain with mobility in low back for improved tolerane to activity    Status On-going               PT Long Term Goals - 02/17/21 1448       PT LONG TERM GOAL #1   Status On-going      PT LONG TERM GOAL #2   Title pt to report ability to stand at least 30 mins with no more than 4/10 pain    Baseline 20-30 min    Time 10    Period Weeks    Status On-going      PT LONG TERM GOAL #3   Title pt to report ability to walk at least 30 mins at a time with no more than 4/10 pain      PT LONG TERM GOAL #4   Title pt to demonstrate bil hip strength to 5/5 globally to improve functional mobility    Baseline --    Status On-going      PT LONG TERM GOAL #5   Title pt to report at least 50 on FOTO for improved functional mobility    Baseline 42    Status On-going                   Plan - 03/04/21 1148     Clinical Impression Statement The patient is able to perform limited repetitions on the leg press today and reports, "I love this."   Therapist closely monitoring response during the entirety of the session to avoid "overdoing it."  Repetitions kept at 10 reps or less and standing/loaded ex's alternated with sitting/unloaded.  Improving soft tissue mobility noted and symptom relieft in left posterior hip musculature following manual therapy.    Personal Factors and Comorbidities Age;Past/Current Experience;Time since onset of injury/illness/exacerbation;Fitness;Comorbidity 1    Comorbidities migraines, multiple previous nerve ablations, fusion    Examination-Activity Limitations Locomotion Level;Sit;Lift;Stand;Stairs;Caring for Others;Sleep;Squat;Reach Overhead    Stability/Clinical Decision Making Evolving/Moderate complexity    Rehab Potential Good    PT Frequency 2x / week    PT Duration Other (comment)    PT  Treatment/Interventions ADLs/Self Care Home Management;Aquatic Therapy;Stair training;Functional mobility training;Therapeutic activities;Therapeutic exercise;Balance  training;Neuromuscular re-education;Manual techniques;Gait training;Patient/family education;Passive range of motion;Dry needling;Energy conservation;Taping    PT Next Visit Plan ERO in 1 week;  continue gentle strength (lower level) to tolerance, tissue mobility and manual therapy    PT Home Exercise Plan [email or web address]             Patient will benefit from skilled therapeutic intervention in order to improve the following deficits and impairments:  Difficulty walking, Increased fascial restricitons, Increased muscle spasms, Decreased endurance, Decreased activity tolerance, Pain, Improper body mechanics, Impaired flexibility, Decreased mobility, Decreased strength, Postural dysfunction  Visit Diagnosis: Muscle weakness (generalized)  Abnormal posture  Cramp and spasm     Problem List Patient Active Problem List   Diagnosis Date Noted   Intolerance of continuous positive airway pressure (CPAP) ventilation 06/17/2020   OSA (obstructive sleep apnea) 05/06/2020   Snoring 05/06/2020   Insomnia secondary to chronic pain 03/24/2020   History of sleep apnea 03/24/2020   Gasping for breath 03/24/2020   Abnormal glucose level 12/16/2019   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   Degeneration of lumbar intervertebral disc 12/16/2019   Hyperlipidemia 12/16/2019   Hypothyroidism 12/16/2019   Insomnia 12/16/2019   Migraine 12/16/2019   Overweight 12/16/2019   Postoperative hypothyroidism 12/16/2019   Vitamin D deficiency 12/16/2019   Hallux rigidus, right foot 12/05/2019   Hammer toe of right foot 12/05/2019   History of left ankle joint replacement 12/05/2019   Aftercare following ankle joint replacement surgery 01/10/2017   Anxiety, mild 12/12/2016   Atypical chest pain 12/12/2016   Cardiac murmur 12/12/2016   GERD  (gastroesophageal reflux disease) 12/12/2016   History of thyroid cancer 12/12/2016   Post-traumatic osteoarthritis of left ankle 10/03/2016   Chronic migraine without aura, with intractable migraine, so stated, with status migrainosus 09/26/2015   Joint pain 02/18/2015   Abnormal C-reactive protein 01/08/2015   Radiculopathy 03/06/2014   Osteoarthritis of right knee 04/01/2013   OA (osteoarthritis) 11/01/2011   OAB (overactive bladder)    Ulcerative colitis ([place])    Cancer (HCC)    Carpal tunnel syndrome    RA (rheumatoid arthritis) ([place])    Ruben Im, PT 03/04/21 11:53 AM Phone: [phone] Fax: [phone]  Alvera Singh, PT 03/04/2021, 11:53 AM  [facility] @ [place], [place], [address] Phone: [phone]   Fax:  [phone]  Name: [place] MRN: [id number] Date of Birth: 1955-10-10

## 2021-03-05 ENCOUNTER — Other Ambulatory Visit: Payer: Self-pay | Admitting: Neurology

## 2021-03-09 ENCOUNTER — Encounter: Payer: Self-pay | Admitting: Physical Therapy

## 2021-03-09 ENCOUNTER — Ambulatory Visit: Payer: Medicare Other | Admitting: Physical Therapy

## 2021-03-09 ENCOUNTER — Other Ambulatory Visit: Payer: Self-pay

## 2021-03-09 DIAGNOSIS — R293 Abnormal posture: Secondary | ICD-10-CM | POA: Diagnosis not present

## 2021-03-09 DIAGNOSIS — R2689 Other abnormalities of gait and mobility: Secondary | ICD-10-CM

## 2021-03-09 DIAGNOSIS — M6281 Muscle weakness (generalized): Secondary | ICD-10-CM | POA: Diagnosis not present

## 2021-03-09 DIAGNOSIS — R252 Cramp and spasm: Secondary | ICD-10-CM

## 2021-03-09 NOTE — Therapy (Signed)
[facility] @ [place], [place], [address] Phone: [phone]   Fax:  [phone]  Physical Therapy Treatment  Patient Details  Name: Terry Shannon MRN: [id number] Date of Birth: Oct 30, 1955 Referring Provider (PT): Normajean Glasgow, MD   Encounter Date: 03/09/2021   PT End of Session - 03/09/21 1512     Visit Number 14    Date for PT Re-Evaluation 03/11/21    Authorization Type Medicare    Authorization - Number of Visits 13    Progress Note Due on Visit 20    PT Start Time 1740    PT Stop Time 1532    PT Time Calculation (min) 47 min    Activity Tolerance Patient tolerated treatment well    Behavior During Therapy [facility] for tasks assessed/performed             Past Medical History:  Diagnosis Date   Allergy    year-round, pt. states   Chronic lower back pain    CMC arthritis, thumb, degenerative 12/1446   right   Complication of anesthesia    slow to wake up after gallbladder surgery   Depression    Eczema    ARMS AND HANDS   GERD (gastroesophageal reflux disease)    History of thyroid cancer    Hypothyroidism    Migraines    Osteoarthritis    Overactive bladder    RA (rheumatoid arthritis) ([place])    Sleep apnea    no CPAP use   Squamous cell carcinoma of skin 12/21/2017   in situ-mid chest (CX35FU)   Ulcerative colitis ([place])     Past Surgical History:  Procedure Laterality Date   ABDOMINAL HERNIA REPAIR  18/56/3149   periumbilical ventral hernia/incisional hernia   BUNIONECTOMY Right    x 2 more   BUNIONECTOMY Left    BUNIONECTOMY WITH CHILECTOMY Right 12/16/2005   CARPAL TUNNEL RELEASE Right 07/13/2001   CARPAL TUNNEL RELEASE Left 08/10/2001   DILATION AND CURETTAGE OF UTERUS     HAMMER TOE SURGERY Left may 2014   HYSTEROSCOPY WITH D & C  03/01/2004   with exc. of endometrial polyp   KNEE ARTHROSCOPY Right 2013   LAPAROSCOPIC CHOLECYSTECTOMY  11/03/2006   LUMBAR FUSION  03/06/2014    l4  l5    nerve ablation lumbar      THYROIDECTOMY, PARTIAL Right prior to 2002   THYROIDECTOMY, PARTIAL Left 11/29/2000   and isthmus   TONSILLECTOMY  1961   TOTAL ANKLE REPLACEMENT Left 11/2016   with tendon repair   TOTAL KNEE ARTHROPLASTY Right 04/01/2013   Procedure: RIGHT TOTAL KNEE ARTHROPLASTY;  Surgeon: Alta Corning, MD;  Location: [facility];  Service: Orthopedics;  Laterality: Right;    There were no vitals filed for this visit.   Subjective Assessment - 03/09/21 1503     Subjective Patient states her right shoulder is uncomfortable today but her back is doing "ok".    Pertinent History Lt ankle replacement, mirganes (starting at base of skull on Lt side and ram horn pattern forward), cervical pain, multiple nerve ablations thorughout cervical and lumbar regions    Limitations Sitting;Lifting;Standing;Walking;House hold activities    How long can you sit comfortably? depends on the chair ~45 mins    How long can you stand comfortably? 30 mins    How long can you walk comfortably? 15 mins    Patient Stated Goals to have less pain  [facility] PT Treatment/Exercise - 03/09/21 0001       Lumbar Exercises: Stretches   Active Hamstring Stretch Right;Left;2 reps;30 seconds    Active Hamstring Stretch Limitations seated    Piriformis Stretch 2 reps;20 seconds      Lumbar Exercises: Aerobic   Nustep 11:30 mins L2-PT present to discuss progress.      Lumbar Exercises: Machines for Strengthening   Leg Press --   "I love this"     Lumbar Exercises: Seated   Other Seated Lumbar Exercises 5# kettlebell sit ups 10x2      Knee/Hip Exercises: Stretches   Active Hamstring Stretch 5 reps;Left;Right    Piriformis Stretch 3 reps;30 seconds      Knee/Hip Exercises: Standing   Other Standing Knee Exercises weight shifting 3 ways on balance pad- abdominal bracing x1 min each      Knee/Hip Exercises: Seated   Long Arc Quad 10  reps;Left;Right    Long Arc Quad Weight 4 lbs.    Hamstring Curl 10 reps   red band and slider     Manual Therapy   Manual Therapy Soft tissue mobilization    Manual therapy comments Addaday to Lt gluteals/piriformis in sidelying  x 8 minutes                       PT Short Term Goals - 02/17/21 1441       PT SHORT TERM GOAL #2   Title pt to report no more than 5/10 pain with mobility in low back for improved tolerane to activity    Status On-going               PT Long Term Goals - 02/17/21 1448       PT LONG TERM GOAL #1   Status On-going      PT LONG TERM GOAL #2   Title pt to report ability to stand at least 30 mins with no more than 4/10 pain    Baseline 20-30 min    Time 10    Period Weeks    Status On-going      PT LONG TERM GOAL #3   Title pt to report ability to walk at least 30 mins at a time with no more than 4/10 pain      PT LONG TERM GOAL #4   Title pt to demonstrate bil hip strength to 5/5 globally to improve functional mobility    Baseline --    Status On-going      PT LONG TERM GOAL #5   Title pt to report at least 50 on FOTO for improved functional mobility    Baseline 42    Status On-going                   Plan - 03/09/21 1525     Clinical Impression Statement Patient declined leg press due to some mild hip soreness after last session.  She was able to complete all other activities with minimal fatigue.  Symptom relief noted in left gluteal area with soft tissue mobilization.    Personal Factors and Comorbidities Age;Past/Current Experience;Time since onset of injury/illness/exacerbation;Fitness;Comorbidity 1    Comorbidities migraines, multiple previous nerve ablations, fusion    Examination-Activity Limitations Locomotion Level;Sit;Lift;Stand;Stairs;Caring for Others;Sleep;Squat;Reach Overhead    Examination-Participation Restrictions Cleaning;Community Activity;Driving;Shop;Laundry;Yard Work    Stability/Clinical  Decision Making Evolving/Moderate complexity    Rehab Potential Good    PT Frequency 2x / week  PT Duration Other (comment)    PT Treatment/Interventions ADLs/Self Care Home Management;Aquatic Therapy;Stair training;Functional mobility training;Therapeutic activities;Therapeutic exercise;Balance training;Neuromuscular re-education;Manual techniques;Gait training;Patient/family education;Passive range of motion;Dry needling;Energy conservation;Taping    PT Next Visit Plan ERO in 1 week;  continue gentle strength (lower level) to tolerance, tissue mobility and manual therapy    PT Home Exercise Plan [email or web address]    Consulted and Agree with Plan of Care Patient             Patient will benefit from skilled therapeutic intervention in order to improve the following deficits and impairments:  Difficulty walking, Increased fascial restricitons, Increased muscle spasms, Decreased endurance, Decreased activity tolerance, Pain, Improper body mechanics, Impaired flexibility, Decreased mobility, Decreased strength, Postural dysfunction  Visit Diagnosis: Muscle weakness (generalized)  Abnormal posture  Other abnormalities of gait and mobility  Cramp and spasm     Problem List Patient Active Problem List   Diagnosis Date Noted   Intolerance of continuous positive airway pressure (CPAP) ventilation 06/17/2020   OSA (obstructive sleep apnea) 05/06/2020   Snoring 05/06/2020   Insomnia secondary to chronic pain 03/24/2020   History of sleep apnea 03/24/2020   Gasping for breath 03/24/2020   Abnormal glucose level 12/16/2019   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   Degeneration of lumbar intervertebral disc 12/16/2019   Hyperlipidemia 12/16/2019   Hypothyroidism 12/16/2019   Insomnia 12/16/2019   Migraine 12/16/2019   Overweight 12/16/2019   Postoperative hypothyroidism 12/16/2019   Vitamin D deficiency 12/16/2019   Hallux rigidus, right foot 12/05/2019   Hammer toe of right foot  12/05/2019   History of left ankle joint replacement 12/05/2019   Aftercare following ankle joint replacement surgery 01/10/2017   Anxiety, mild 12/12/2016   Atypical chest pain 12/12/2016   Cardiac murmur 12/12/2016   GERD (gastroesophageal reflux disease) 12/12/2016   History of thyroid cancer 12/12/2016   Post-traumatic osteoarthritis of left ankle 10/03/2016   Chronic migraine without aura, with intractable migraine, so stated, with status migrainosus 09/26/2015   Joint pain 02/18/2015   Abnormal C-reactive protein 01/08/2015   Radiculopathy 03/06/2014   Osteoarthritis of right knee 04/01/2013   OA (osteoarthritis) 11/01/2011   OAB (overactive bladder)    Ulcerative colitis ([place])    Cancer (HCC)    Carpal tunnel syndrome    RA (rheumatoid arthritis) ([place])     Axiel Fjeld B. Jamar Weatherall, PT 10/18/223:34 PM   [facility] @ [place], [place], [address] Phone: [phone]   Fax:  [phone]  Name: Terry Shannon MRN: [id number] Date of Birth: [birth year]

## 2021-03-11 ENCOUNTER — Ambulatory Visit: Payer: Medicare Other | Admitting: Physical Therapy

## 2021-03-11 ENCOUNTER — Encounter: Payer: Self-pay | Admitting: Physical Therapy

## 2021-03-11 ENCOUNTER — Other Ambulatory Visit: Payer: Self-pay

## 2021-03-11 DIAGNOSIS — M6281 Muscle weakness (generalized): Secondary | ICD-10-CM

## 2021-03-11 DIAGNOSIS — R293 Abnormal posture: Secondary | ICD-10-CM | POA: Diagnosis not present

## 2021-03-11 DIAGNOSIS — R2689 Other abnormalities of gait and mobility: Secondary | ICD-10-CM | POA: Diagnosis not present

## 2021-03-11 DIAGNOSIS — R252 Cramp and spasm: Secondary | ICD-10-CM

## 2021-03-11 NOTE — Therapy (Signed)
[facility] @ [place], [place], [address] Phone: [phone]   Fax:  [phone]  Physical Therapy Treatment/Recertification  Patient Details  Name: Terry Shannon MRN: [id number] Date of Birth: 1955-10-13 Referring Provider (PT): Normajean Glasgow, MD   Encounter Date: 03/11/2021   PT End of Session - 03/11/21 1429     Visit Number 15    Date for PT Re-Evaluation 04/22/21    Authorization Type Medicare    Progress Note Due on Visit 20    PT Start Time 1400    PT Stop Time 1440    PT Time Calculation (min) 40 min    Activity Tolerance Patient tolerated treatment well    Behavior During Therapy [facility] for tasks assessed/performed             Past Medical History:  Diagnosis Date   Allergy    year-round, pt. states   Chronic lower back pain    CMC arthritis, thumb, degenerative 06/3298   right   Complication of anesthesia    slow to wake up after gallbladder surgery   Depression    Eczema    ARMS AND HANDS   GERD (gastroesophageal reflux disease)    History of thyroid cancer    Hypothyroidism    Migraines    Osteoarthritis    Overactive bladder    RA (rheumatoid arthritis) ([place])    Sleep apnea    no CPAP use   Squamous cell carcinoma of skin 12/21/2017   in situ-mid chest (CX35FU)   Ulcerative colitis ([place])     Past Surgical History:  Procedure Laterality Date   ABDOMINAL HERNIA REPAIR  76/22/6333   periumbilical ventral hernia/incisional hernia   BUNIONECTOMY Right    x 2 more   BUNIONECTOMY Left    BUNIONECTOMY WITH CHILECTOMY Right 12/16/2005   CARPAL TUNNEL RELEASE Right 07/13/2001   CARPAL TUNNEL RELEASE Left 08/10/2001   DILATION AND CURETTAGE OF UTERUS     HAMMER TOE SURGERY Left may 2014   HYSTEROSCOPY WITH D & C  03/01/2004   with exc. of endometrial polyp   KNEE ARTHROSCOPY Right 2013   LAPAROSCOPIC CHOLECYSTECTOMY  11/03/2006   LUMBAR FUSION  03/06/2014   l4  l5    nerve  ablation lumbar      THYROIDECTOMY, PARTIAL Right prior to 2002   THYROIDECTOMY, PARTIAL Left 11/29/2000   and isthmus   TONSILLECTOMY  1961   TOTAL ANKLE REPLACEMENT Left 11/2016   with tendon repair   TOTAL KNEE ARTHROPLASTY Right 04/01/2013   Procedure: RIGHT TOTAL KNEE ARTHROPLASTY;  Surgeon: Alta Corning, MD;  Location: [facility];  Service: Orthopedics;  Laterality: Right;    There were no vitals filed for this visit.   Subjective Assessment - 03/11/21 1402     Subjective No new complaints today.  Admits still moderate difficulty with daily tasks due to her back. However, as of last week, I feel like I can do so much more since starting PT.  For example, I can bend down and pick up a light object from the floor without feeling like I need to take a pain pill. I'd like to be able to walk my dog but I think I should start walking more first then add in walking the dog.    Pertinent History Lt ankle replacement, mirganes (starting at base of skull on Lt side and ram horn pattern forward), cervical pain, multiple nerve ablations thorughout cervical and lumbar  regions    How long can you sit comfortably? depends on the chair ~ about an hour    How long can you stand comfortably? 30 mins    How long can you walk comfortably? 10 mins    Patient Stated Goals to have less pain so that I can start a walking program and begin walking my dog    Pain Score 1     Pain Location Hip    Pain Orientation Left;Posterior    Pain Descriptors / Indicators Aching    Pain Type Chronic pain    Pain Onset More than a month ago    Pain Frequency Intermittent    Aggravating Factors  movement and lifting    Pain Relieving Factors rest,ice, medication, Gabapentin    Pain Score 1    Pain Location Hip    Pain Orientation Left    Pain Descriptors / Indicators Aching    Pain Type Chronic pain    Pain Onset More than a month ago    Pain Frequency Intermittent                [facility] PT Assessment - 03/11/21  0001       Assessment   Medical Diagnosis M47.9 (ICD-10-CM) - Spondylosis, Lt hip pain    Referring Provider (PT) Normajean Glasgow, MD      Restrictions   Weight Bearing Restrictions No      Prior Function   Level of Independence Independent    Vocation Retired      Associate Professor   Overall Cognitive Status Within Functional Limits for tasks assessed      Observation/Other Assessments   Focus on Therapeutic Outcomes (FOTO)  49      AROM   Lumbar Flexion WFL    Lumbar Extension WFL    Lumbar - Right Side Bend limited by 25%    Lumbar - Left Side Bend WFL    Lumbar - Right Rotation limited by 25%    Lumbar - Left Rotation [facility]    Thoracic Flexion limited by 50%    Thoracic Extension limited by 50%    Thoracic - Right Side Bend limited by 50%    Thoracic - Left Side Bend limited by 50%    Thoracic - Right Rotation limited by 50%    Thoracic - Left Rotation limited by 50%      Strength   Overall Strength Comments bil hip strength globally 4/5 with exception of Lt hip abduction 4-/5, trunk strength generally 4/5, knee strength 4/5      6 minute walk test results    Aerobic Endurance Distance Walked 1288    Endurance additional comments no increase in pain but rates exertion at 8/10                           [facility] PT Treatment/Exercise - 03/11/21 0001       Lumbar Exercises: Aerobic   Nustep 11:30 mins L2-PT present to discuss progress.                       PT Short Term Goals - 02/17/21 1441       PT SHORT TERM GOAL #2   Title pt to report no more than 5/10 pain with mobility in low back for improved tolerane to activity    Status On-going  PT Long Term Goals - 03/11/21 1408       PT LONG TERM GOAL #1   Title pt to demonstrate I with advanced HEP    Status Partially Met    Target Date 04/22/21      PT LONG TERM GOAL #2   Title pt to report ability to stand at least 20 mins with no more than 4/10 pain    Status  Revised    Target Date 04/22/21      PT LONG TERM GOAL #3   Title pt to report ability to walk at least 10 mins at a time with no more than 4/10 pain    Status Revised    Target Date 04/22/21      PT LONG TERM GOAL #4   Title pt to demonstrate bil hip strength to 5/5 globally to improve functional mobility to allow for walking her dog and doing stairs    Time 10    Period Weeks    Status Revised    Target Date 04/22/21      PT LONG TERM GOAL #5   Title pt to report at least 83 on FOTO for improved functional mobility    Baseline 42    Time 10    Period Weeks    Status Partially Met    Target Date 04/22/21      Additional Long Term Goals   Additional Long Term Goals Yes      PT LONG TERM GOAL #6   Title Patient to be able to walk 1340 feet on 6 min walk test    Time 6    Period Weeks    Status New    Target Date 04/22/21                   Plan - 03/11/21 1430     Clinical Impression Statement Patient progressing toward goals and has partially met several of her goals.  She reports increased function and decreased pain with functional actiivties but admits she would like to be able to do more. She had baseline 6 min walk test of 1288 feet without pain in the hip today.  She continues to experience fatigue with walking.  She would benefit from continued skilled PT to address tolerance to walking and to reach her goal of walking her dog.    Personal Factors and Comorbidities Age;Past/Current Experience;Time since onset of injury/illness/exacerbation;Fitness;Comorbidity 1    Comorbidities migraines, multiple previous nerve ablations, fusion    Examination-Activity Limitations Locomotion Level;Sit;Lift;Stand;Stairs;Caring for Others;Sleep;Squat;Reach Overhead    Examination-Participation Restrictions Cleaning;Community Activity;Driving;Shop;Laundry;Yard Work    [employer]chant [employer] officer Evolving/Moderate complexity    Clinical Decision Making Moderate    Rehab  Potential Good    PT Frequency 2x / week    PT Duration Other (comment)    PT Treatment/Interventions ADLs/Self Care Home Management;Aquatic Therapy;Stair training;Functional mobility training;Therapeutic activities;Therapeutic exercise;Balance training;Neuromuscular re-education;Manual techniques;Gait training;Patient/family education;Passive range of motion;Dry needling;Energy conservation;Taping    PT Next Visit Plan continue gentle strength (lower level) to tolerance, tissue mobility and manual therapy    PT Home Exercise Plan [email or web address]    Consulted and Agree with Plan of Care Patient             Patient will benefit from skilled therapeutic intervention in order to improve the following deficits and impairments:  Difficulty walking, Increased fascial restricitons, Increased muscle spasms, Decreased endurance, Decreased activity tolerance, Pain, Improper body mechanics, Impaired flexibility, Decreased mobility, Decreased strength, Postural dysfunction  Visit Diagnosis: Muscle weakness (generalized) - Plan: PT plan of care cert/re-cert  Other abnormalities of gait and mobility - Plan: PT plan of care cert/re-cert  Cramp and spasm - Plan: PT plan of care cert/re-cert     Problem List Patient Active Problem List   Diagnosis Date Noted   Intolerance of continuous positive airway pressure (CPAP) ventilation 06/17/2020   OSA (obstructive sleep apnea) 05/06/2020   Snoring 05/06/2020   Insomnia secondary to chronic pain 03/24/2020   History of sleep apnea 03/24/2020   Gasping for breath 03/24/2020   Abnormal glucose level 12/16/2019   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   Degeneration of lumbar intervertebral disc 12/16/2019   Hyperlipidemia 12/16/2019   Hypothyroidism 12/16/2019   Insomnia 12/16/2019   Migraine 12/16/2019   Overweight 12/16/2019   Postoperative hypothyroidism 12/16/2019   Vitamin D deficiency 12/16/2019   Hallux rigidus, right foot 12/05/2019   Hammer  toe of right foot 12/05/2019   History of left ankle joint replacement 12/05/2019   Aftercare following ankle joint replacement surgery 01/10/2017   Anxiety, mild 12/12/2016   Atypical chest pain 12/12/2016   Cardiac murmur 12/12/2016   GERD (gastroesophageal reflux disease) 12/12/2016   History of thyroid cancer 12/12/2016   Post-traumatic osteoarthritis of left ankle 10/03/2016   Chronic migraine without aura, with intractable migraine, so stated, with status migrainosus 09/26/2015   Joint pain 02/18/2015   Abnormal C-reactive protein 01/08/2015   Radiculopathy 03/06/2014   Osteoarthritis of right knee 04/01/2013   OA (osteoarthritis) 11/01/2011   OAB (overactive bladder)    Ulcerative colitis ([place])    Cancer (HCC)    Carpal tunnel syndrome    RA (rheumatoid arthritis) ([place])     Savio Albrecht B. Aaminah Forrester, PT 10/20/222:50 PM   [facility] @ [place], [place], [address] Phone: [phone]   Fax:  [phone]  Name: Terry Shannon MRN: [id number] Date of Birth: [birth year]

## 2021-03-12 DIAGNOSIS — Z23 Encounter for immunization: Secondary | ICD-10-CM | POA: Diagnosis not present

## 2021-03-15 DIAGNOSIS — H04123 Dry eye syndrome of bilateral lacrimal glands: Secondary | ICD-10-CM | POA: Diagnosis not present

## 2021-03-15 DIAGNOSIS — H02885 Meibomian gland dysfunction left lower eyelid: Secondary | ICD-10-CM | POA: Diagnosis not present

## 2021-03-15 DIAGNOSIS — H02882 Meibomian gland dysfunction right lower eyelid: Secondary | ICD-10-CM | POA: Diagnosis not present

## 2021-03-15 DIAGNOSIS — H2513 Age-related nuclear cataract, bilateral: Secondary | ICD-10-CM | POA: Diagnosis not present

## 2021-03-16 ENCOUNTER — Other Ambulatory Visit: Payer: Self-pay

## 2021-03-16 ENCOUNTER — Ambulatory Visit: Payer: Medicare Other | Admitting: Physical Therapy

## 2021-03-16 DIAGNOSIS — R2689 Other abnormalities of gait and mobility: Secondary | ICD-10-CM | POA: Diagnosis not present

## 2021-03-16 DIAGNOSIS — M6281 Muscle weakness (generalized): Secondary | ICD-10-CM

## 2021-03-16 DIAGNOSIS — R293 Abnormal posture: Secondary | ICD-10-CM | POA: Diagnosis not present

## 2021-03-16 DIAGNOSIS — R252 Cramp and spasm: Secondary | ICD-10-CM

## 2021-03-16 NOTE — Therapy (Signed)
[facility] @ [facility] [place], [place], [address] Phone: [phone]   Fax:  [phone]  Physical Therapy Treatment  Patient Details  Name: Terry Shannon MRN: [id number] Date of Birth: 1955/07/14 Referring Provider (PT): Normajean Glasgow, MD   Encounter Date: 03/16/2021   PT End of Session - 03/16/21 1534     Visit Number 16    Date for PT Re-Evaluation 04/22/21    Authorization Type Medicare    Authorization - Number of Visits 16    Progress Note Due on Visit 20    PT Start Time 1528    PT Stop Time 9937    PT Time Calculation (min) 45 min    Activity Tolerance Patient tolerated treatment well             Past Medical History:  Diagnosis Date   Allergy    year-round, pt. states   Chronic lower back pain    CMC arthritis, thumb, degenerative 05/6965   right   Complication of anesthesia    slow to wake up after gallbladder surgery   Depression    Eczema    ARMS AND HANDS   GERD (gastroesophageal reflux disease)    History of thyroid cancer    Hypothyroidism    Migraines    Osteoarthritis    Overactive bladder    RA (rheumatoid arthritis) ([place])    Sleep apnea    no CPAP use   Squamous cell carcinoma of skin 12/21/2017   in situ-mid chest (CX35FU)   Ulcerative colitis ([address])     Past Surgical History:  Procedure Laterality Date   ABDOMINAL HERNIA REPAIR  89/38/1017   periumbilical ventral hernia/incisional hernia   BUNIONECTOMY Right    x 2 more   BUNIONECTOMY Left    BUNIONECTOMY WITH CHILECTOMY Right 12/16/2005   CARPAL TUNNEL RELEASE Right 07/13/2001   CARPAL TUNNEL RELEASE Left 08/10/2001   DILATION AND CURETTAGE OF UTERUS     HAMMER TOE SURGERY Left may 2014   HYSTEROSCOPY WITH D & C  03/01/2004   with exc. of endometrial polyp   KNEE ARTHROSCOPY Right 2013   LAPAROSCOPIC CHOLECYSTECTOMY  11/03/2006   LUMBAR FUSION  03/06/2014   l4  l5    nerve ablation lumbar      THYROIDECTOMY, PARTIAL  Right prior to 2002   THYROIDECTOMY, PARTIAL Left 11/29/2000   and isthmus   TONSILLECTOMY  1961   TOTAL ANKLE REPLACEMENT Left 11/2016   with tendon repair   TOTAL KNEE ARTHROPLASTY Right 04/01/2013   Procedure: RIGHT TOTAL KNEE ARTHROPLASTY;  Surgeon: Alta Corning, MD;  Location: [facility];  Service: Orthopedics;  Laterality: Right;    There were no vitals filed for this visit.   Subjective Assessment - 03/16/21 1530     Subjective I really had a rough time after last session (reassessment tests).  Today it feels better.    Pertinent History Lt ankle replacement, mirganes (starting at base of skull on Lt side and ram horn pattern forward), cervical pain, multiple nerve ablations thorughout cervical and lumbar regions    Patient Stated Goals to have less pain so that I can start a walking program and begin walking my dog    Currently in Pain? Yes    Pain Score 1     Pain Location Back    Pain Type Chronic pain  [facility] Adult PT Treatment/Exercise - 03/16/21 0001       Lumbar Exercises: Stretches   Active Hamstring Stretch Right;Left;2 reps;30 seconds    Active Hamstring Stretch Limitations seated    Piriformis Stretch 2 reps;20 seconds      Lumbar Exercises: Aerobic   Nustep L3 10 min while discussing status   new model     Lumbar Exercises: Seated   Other Seated Lumbar Exercises 2 rounds seated yellow ball core series: hip to hip, shoulder to hip; rock the baby; ear to ear 7-8x each      Knee/Hip Exercises: Standing   Other Standing Knee Exercises weight shifting 3 ways on balance pad- abdominal bracing x1 min each      Knee/Hip Exercises: Seated   Sit to Sand 5 reps   mat table + cushion     Manual Therapy   Manual Therapy Soft tissue mobilization    Manual therapy comments Addaday to Lt gluteals/piriformis in sidelying  x 8 minutes                       PT Short Term Goals - 02/17/21 1441       PT SHORT  TERM GOAL #2   Title pt to report no more than 5/10 pain with mobility in low back for improved tolerane to activity    Status On-going               PT Long Term Goals - 03/11/21 1408       PT LONG TERM GOAL #1   Title pt to demonstrate I with advanced HEP    Status Partially Met    Target Date 04/22/21      PT LONG TERM GOAL #2   Title pt to report ability to stand at least 20 mins with no more than 4/10 pain    Status Revised    Target Date 04/22/21      PT LONG TERM GOAL #3   Title pt to report ability to walk at least 10 mins at a time with no more than 4/10 pain    Status Revised    Target Date 04/22/21      PT LONG TERM GOAL #4   Title pt to demonstrate bil hip strength to 5/5 globally to improve functional mobility to allow for walking her dog and doing stairs    Time 10    Period Weeks    Status Revised    Target Date 04/22/21      PT LONG TERM GOAL #5   Title pt to report at least 66 on FOTO for improved functional mobility    Baseline 42    Time 10    Period Weeks    Status Partially Met    Target Date 04/22/21      Additional Long Term Goals   Additional Long Term Goals Yes      PT LONG TERM GOAL #6   Title Patient to be able to walk 1340 feet on 6 min walk test    Time 6    Period Weeks    Status New    Target Date 04/22/21                   Plan - 03/16/21 1619     Clinical Impression Statement The patient reports she will typically feel fine right after treatment but will have an increase in pain later.  She had a flare up  after objective retesting last visit.  Therapist monitoring response throughout session and modifying to underdose intensity of exercise with low repetitions, light weights and alternating between sitting and standing positions to avoid further exacerbation.    Personal Factors and Comorbidities Age;Past/Current Experience;Time since onset of injury/illness/exacerbation;Fitness;Comorbidity 1    Examination-Activity  Limitations Locomotion Level;Sit;Lift;Stand;Stairs;Caring for Others;Sleep;Squat;Reach Overhead    Rehab Potential Good    PT Frequency 2x / week    PT Duration Other (comment)    PT Treatment/Interventions ADLs/Self Care Home Management;Aquatic Therapy;Stair training;Functional mobility training;Therapeutic activities;Therapeutic exercise;Balance training;Neuromuscular re-education;Manual techniques;Gait training;Patient/family education;Passive range of motion;Dry needling;Energy conservation;Taping    PT Next Visit Plan continue gentle strength (lower level) to tolerance,  seated yellow ball core series;  tissue mobility and manual therapy/Addaday    PT Home Exercise Plan [email or web address]             Patient will benefit from skilled therapeutic intervention in order to improve the following deficits and impairments:  Difficulty walking, Increased fascial restricitons, Increased muscle spasms, Decreased endurance, Decreased activity tolerance, Pain, Improper body mechanics, Impaired flexibility, Decreased mobility, Decreased strength, Postural dysfunction  Visit Diagnosis: Muscle weakness (generalized)  Other abnormalities of gait and mobility  Cramp and spasm     Problem List Patient Active Problem List   Diagnosis Date Noted   Intolerance of continuous positive airway pressure (CPAP) ventilation 06/17/2020   OSA (obstructive sleep apnea) 05/06/2020   Snoring 05/06/2020   Insomnia secondary to chronic pain 03/24/2020   History of sleep apnea 03/24/2020   Gasping for breath 03/24/2020   Abnormal glucose level 12/16/2019   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   Degeneration of lumbar intervertebral disc 12/16/2019   Hyperlipidemia 12/16/2019   Hypothyroidism 12/16/2019   Insomnia 12/16/2019   Migraine 12/16/2019   Overweight 12/16/2019   Postoperative hypothyroidism 12/16/2019   Vitamin D deficiency 12/16/2019   Hallux rigidus, right foot 12/05/2019   Hammer toe of right  foot 12/05/2019   History of left ankle joint replacement 12/05/2019   Aftercare following ankle joint replacement surgery 01/10/2017   Anxiety, mild 12/12/2016   Atypical chest pain 12/12/2016   Cardiac murmur 12/12/2016   GERD (gastroesophageal reflux disease) 12/12/2016   History of thyroid cancer 12/12/2016   Post-traumatic osteoarthritis of left ankle 10/03/2016   Chronic migraine without aura, with intractable migraine, so stated, with status migrainosus 09/26/2015   Joint pain 02/18/2015   Abnormal C-reactive protein 01/08/2015   Radiculopathy 03/06/2014   Osteoarthritis of right knee 04/01/2013   OA (osteoarthritis) 11/01/2011   OAB (overactive bladder)    Ulcerative colitis ([place])    Cancer (HCC)    Carpal tunnel syndrome    RA (rheumatoid arthritis) ([place])    Ruben Im, PT 03/16/21 4:28 PM Phone: [phone] Fax: [phone]  Alvera Singh, PT 03/16/2021, 4:27 PM  [facility] @ [facility] [place], [place], [address] Phone: [phone]   Fax:  [phone]  Name: Macie Baum MRN: [id number] Date of Birth: 12-27-1955

## 2021-03-18 ENCOUNTER — Ambulatory Visit: Payer: Medicare Other | Admitting: Physical Therapy

## 2021-03-18 ENCOUNTER — Other Ambulatory Visit: Payer: Self-pay

## 2021-03-18 DIAGNOSIS — R252 Cramp and spasm: Secondary | ICD-10-CM

## 2021-03-18 DIAGNOSIS — M6281 Muscle weakness (generalized): Secondary | ICD-10-CM

## 2021-03-18 DIAGNOSIS — R293 Abnormal posture: Secondary | ICD-10-CM | POA: Diagnosis not present

## 2021-03-18 DIAGNOSIS — R2689 Other abnormalities of gait and mobility: Secondary | ICD-10-CM | POA: Diagnosis not present

## 2021-03-18 NOTE — Therapy (Signed)
[facility] @ [facility] [place], [place], [address] Phone: [phone]   Fax:  [phone]  Physical Therapy Treatment  Patient Details  Name: Terry Shannon MRN: [id number] Date of Birth: Dec 30, 1955 Referring Provider (PT): Normajean Glasgow, MD   Encounter Date: 03/18/2021   PT End of Session - 03/18/21 1507     Visit Number 17    Date for PT Re-Evaluation 04/22/21    Authorization Type Medicare    Authorization - Number of Visits 17    Progress Note Due on Visit 20    PT Start Time 1446    PT Stop Time 1525    PT Time Calculation (min) 39 min    Activity Tolerance Patient tolerated treatment well             Past Medical History:  Diagnosis Date   Allergy    year-round, pt. states   Chronic lower back pain    CMC arthritis, thumb, degenerative 12/9209   right   Complication of anesthesia    slow to wake up after gallbladder surgery   Depression    Eczema    ARMS AND HANDS   GERD (gastroesophageal reflux disease)    History of thyroid cancer    Hypothyroidism    Migraines    Osteoarthritis    Overactive bladder    RA (rheumatoid arthritis) ([place])    Sleep apnea    no CPAP use   Squamous cell carcinoma of skin 12/21/2017   in situ-mid chest (CX35FU)   Ulcerative colitis ([place])     Past Surgical History:  Procedure Laterality Date   ABDOMINAL HERNIA REPAIR  94/17/4081   periumbilical ventral hernia/incisional hernia   BUNIONECTOMY Right    x 2 more   BUNIONECTOMY Left    BUNIONECTOMY WITH CHILECTOMY Right 12/16/2005   CARPAL TUNNEL RELEASE Right 07/13/2001   CARPAL TUNNEL RELEASE Left 08/10/2001   DILATION AND CURETTAGE OF UTERUS     HAMMER TOE SURGERY Left may 2014   HYSTEROSCOPY WITH D & C  03/01/2004   with exc. of endometrial polyp   KNEE ARTHROSCOPY Right 2013   LAPAROSCOPIC CHOLECYSTECTOMY  11/03/2006   LUMBAR FUSION  03/06/2014   l4  l5    nerve ablation lumbar      THYROIDECTOMY, PARTIAL  Right prior to 2002   THYROIDECTOMY, PARTIAL Left 11/29/2000   and isthmus   TONSILLECTOMY  1961   TOTAL ANKLE REPLACEMENT Left 11/2016   with tendon repair   TOTAL KNEE ARTHROPLASTY Right 04/01/2013   Procedure: RIGHT TOTAL KNEE ARTHROPLASTY;  Surgeon: Alta Corning, MD;  Location: [facility];  Service: Orthopedics;  Laterality: Right;    There were no vitals filed for this visit.   Subjective Assessment - 03/18/21 1453     Subjective Back and knees hurt a little bit.    Pertinent History Lt ankle replacement, mirganes (starting at base of skull on Lt side and ram horn pattern forward), cervical pain, multiple nerve ablations thorughout cervical and lumbar regions    Limitations Sitting;Lifting;Standing;Walking;House hold activities    How long can you sit comfortably? depends on the chair ~ about an hour    Currently in Pain? Yes    Pain Location Back    Multiple Pain Sites Yes    Pain Score 2    Pain Location Knee    Pain Orientation Left;Right  [facility] Adult PT Treatment/Exercise - 03/18/21 0001       Lumbar Exercises: Stretches   Active Hamstring Stretch Right;Left;2 reps;30 seconds    Active Hamstring Stretch Limitations seated    Piriformis Stretch 2 reps;20 seconds      Lumbar Exercises: Aerobic   Nustep L5 10 min while discussing status   new model     Lumbar Exercises: Standing   Other Standing Lumbar Exercises same side UE/ knee lift 5x each side      Lumbar Exercises: Seated   Other Seated Lumbar Exercises 1 rounds seated yellow ball core series: hip to hip, shoulder to hip; rock the baby; ear to ear 7-8x each      Knee/Hip Exercises: Standing   Other Standing Knee Exercises weight shifting 3 ways on balance pad- abdominal bracing x1 min each      Knee/Hip Exercises: Seated   Long Arc Quad 10 reps;Left;Right    Long Arc Quad Weight 4 lbs.    Sit to Sand 5 reps   mat table + cushion holding 4.4# plyo ball                       PT Short Term Goals - 02/17/21 1441       PT SHORT TERM GOAL #2   Title pt to report no more than 5/10 pain with mobility in low back for improved tolerane to activity    Status On-going               PT Long Term Goals - 03/11/21 1408       PT LONG TERM GOAL #1   Title pt to demonstrate I with advanced HEP    Status Partially Met    Target Date 04/22/21      PT LONG TERM GOAL #2   Title pt to report ability to stand at least 20 mins with no more than 4/10 pain    Status Revised    Target Date 04/22/21      PT LONG TERM GOAL #3   Title pt to report ability to walk at least 10 mins at a time with no more than 4/10 pain    Status Revised    Target Date 04/22/21      PT LONG TERM GOAL #4   Title pt to demonstrate bil hip strength to 5/5 globally to improve functional mobility to allow for walking her dog and doing stairs    Time 10    Period Weeks    Status Revised    Target Date 04/22/21      PT LONG TERM GOAL #5   Title pt to report at least 38 on FOTO for improved functional mobility    Baseline 42    Time 10    Period Weeks    Status Partially Met    Target Date 04/22/21      Additional Long Term Goals   Additional Long Term Goals Yes      PT LONG TERM GOAL #6   Title Patient to be able to walk 1340 feet on 6 min walk test    Time 6    Period Weeks    Status New    Target Date 04/22/21                   Plan - 03/18/21 1514     Clinical Impression Statement The patient is able to perform graded activity with low reps and  resistance to avoid exacerbation of symptoms.  She has numerous co-morbidities and chronic pain which require low dosing of exercise.  We discussed a tapering of visits to 1x/week for 1 month to promote independence with her HEP.  Therapist closely monitoring response throughout treatment session.    Personal Factors and Comorbidities Age;Past/Current Experience;Time since onset of  injury/illness/exacerbation;Fitness;Comorbidity 1    Comorbidities migraines, multiple previous nerve ablations, fusion    Examination-Activity Limitations Locomotion Level;Sit;Lift;Stand;Stairs;Caring for Others;Sleep;Squat;Reach Overhead    Examination-Participation Restrictions Cleaning;Community Activity;Driving;Shop;Laundry;Yard Work    Rehab Potential Good    PT Frequency 2x / week    PT Duration Other (comment)    PT Treatment/Interventions ADLs/Self Care Home Management;Aquatic Therapy;Stair training;Functional mobility training;Therapeutic activities;Therapeutic exercise;Balance training;Neuromuscular re-education;Manual techniques;Gait training;Patient/family education;Passive range of motion;Dry needling;Energy conservation;Taping    PT Next Visit Plan discussed decreasing frequency to 1x/week;  continue gentle strength (lower level) to tolerance,  seated yellow ball core series;  tissue mobility and manual therapy/Addaday    PT Home Exercise Plan [email or web address]             Patient will benefit from skilled therapeutic intervention in order to improve the following deficits and impairments:  Difficulty walking, Increased fascial restricitons, Increased muscle spasms, Decreased endurance, Decreased activity tolerance, Pain, Improper body mechanics, Impaired flexibility, Decreased mobility, Decreased strength, Postural dysfunction  Visit Diagnosis: Muscle weakness (generalized)  Other abnormalities of gait and mobility  Cramp and spasm  Abnormal posture     Problem List Patient Active Problem List   Diagnosis Date Noted   Intolerance of continuous positive airway pressure (CPAP) ventilation 06/17/2020   OSA (obstructive sleep apnea) 05/06/2020   Snoring 05/06/2020   Insomnia secondary to chronic pain 03/24/2020   History of sleep apnea 03/24/2020   Gasping for breath 03/24/2020   Abnormal glucose level 12/16/2019   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   Degeneration of  lumbar intervertebral disc 12/16/2019   Hyperlipidemia 12/16/2019   Hypothyroidism 12/16/2019   Insomnia 12/16/2019   Migraine 12/16/2019   Overweight 12/16/2019   Postoperative hypothyroidism 12/16/2019   Vitamin D deficiency 12/16/2019   Hallux rigidus, right foot 12/05/2019   Hammer toe of right foot 12/05/2019   History of left ankle joint replacement 12/05/2019   Aftercare following ankle joint replacement surgery 01/10/2017   Anxiety, mild 12/12/2016   Atypical chest pain 12/12/2016   Cardiac murmur 12/12/2016   GERD (gastroesophageal reflux disease) 12/12/2016   History of thyroid cancer 12/12/2016   Post-traumatic osteoarthritis of left ankle 10/03/2016   Chronic migraine without aura, with intractable migraine, so stated, with status migrainosus 09/26/2015   Joint pain 02/18/2015   Abnormal C-reactive protein 01/08/2015   Radiculopathy 03/06/2014   Osteoarthritis of right knee 04/01/2013   OA (osteoarthritis) 11/01/2011   OAB (overactive bladder)    Ulcerative colitis ([place])    Cancer (HCC)    Carpal tunnel syndrome    RA (rheumatoid arthritis) ([place])    Ruben Im, PT 03/18/21 4:10 PM Phone: [phone] Fax: [phone]  Alvera Singh, PT 03/18/2021, 4:10 PM  [facility] @ [facility] [place], [place], [address] Phone: [phone]   Fax:  [phone]  Name: Terry Shannon MRN: [id number] Date of Birth: 1955/07/22

## 2021-03-22 ENCOUNTER — Ambulatory Visit: Payer: Medicare Other | Admitting: Neurology

## 2021-03-25 DIAGNOSIS — M0579 Rheumatoid arthritis with rheumatoid factor of multiple sites without organ or systems involvement: Secondary | ICD-10-CM | POA: Diagnosis not present

## 2021-03-25 DIAGNOSIS — Z79899 Other long term (current) drug therapy: Secondary | ICD-10-CM | POA: Diagnosis not present

## 2021-03-25 DIAGNOSIS — Z111 Encounter for screening for respiratory tuberculosis: Secondary | ICD-10-CM | POA: Diagnosis not present

## 2021-03-31 ENCOUNTER — Ambulatory Visit: Payer: Medicare Other | Attending: Internal Medicine

## 2021-03-31 ENCOUNTER — Other Ambulatory Visit: Payer: Self-pay

## 2021-03-31 DIAGNOSIS — R293 Abnormal posture: Secondary | ICD-10-CM | POA: Insufficient documentation

## 2021-03-31 DIAGNOSIS — R2689 Other abnormalities of gait and mobility: Secondary | ICD-10-CM | POA: Insufficient documentation

## 2021-03-31 DIAGNOSIS — R252 Cramp and spasm: Secondary | ICD-10-CM | POA: Insufficient documentation

## 2021-03-31 DIAGNOSIS — M6281 Muscle weakness (generalized): Secondary | ICD-10-CM | POA: Diagnosis not present

## 2021-03-31 NOTE — Therapy (Signed)
[facility] [facility] @ [address] [address] [place], [place], [address] Phone: [phone]   Fax:  [phone]  Physical Therapy Treatment  Patient Details  Name: Terry Shannon MRN: [id number] Date of Birth: [birth year] Referring Provider (PT): Normajean Glasgow, MD   Encounter Date: 03/31/2021   PT End of Session - 03/31/21 1526     Visit Number 18    Date for PT Re-Evaluation 04/22/21    Authorization Type Medicare    Progress Note Due on Visit 20    PT Start Time 1447    PT Stop Time 1525    PT Time Calculation (min) 38 min    Activity Tolerance Patient tolerated treatment well    Behavior During Therapy [facility] for tasks assessed/performed             Past Medical History:  Diagnosis Date   Allergy    year-round, pt. states   Chronic lower back pain    CMC arthritis, thumb, degenerative 10/9483   right   Complication of anesthesia    slow to wake up after gallbladder surgery   Depression    Eczema    ARMS AND HANDS   GERD (gastroesophageal reflux disease)    History of thyroid cancer    Hypothyroidism    Migraines    Osteoarthritis    Overactive bladder    RA (rheumatoid arthritis) ([place])    Sleep apnea    no CPAP use   Squamous cell carcinoma of skin 12/21/2017   in situ-mid chest (CX35FU)   Ulcerative colitis ([place])     Past Surgical History:  Procedure Laterality Date   ABDOMINAL HERNIA REPAIR  46/27/0350   periumbilical ventral hernia/incisional hernia   BUNIONECTOMY Right    x 2 more   BUNIONECTOMY Left    BUNIONECTOMY WITH CHILECTOMY Right 12/16/2005   CARPAL TUNNEL RELEASE Right 07/13/2001   CARPAL TUNNEL RELEASE Left 08/10/2001   DILATION AND CURETTAGE OF UTERUS     HAMMER TOE SURGERY Left may 2014   HYSTEROSCOPY WITH D & C  03/01/2004   with exc. of endometrial polyp   KNEE ARTHROSCOPY Right 2013   LAPAROSCOPIC CHOLECYSTECTOMY  11/03/2006   LUMBAR FUSION  03/06/2014   l4  l5    nerve ablation lumbar       THYROIDECTOMY, PARTIAL Right prior to 2002   THYROIDECTOMY, PARTIAL Left 11/29/2000   and isthmus   TONSILLECTOMY  1961   TOTAL ANKLE REPLACEMENT Left 11/2016   with tendon repair   TOTAL KNEE ARTHROPLASTY Right 04/01/2013   Procedure: RIGHT TOTAL KNEE ARTHROPLASTY;  Surgeon: Alta Corning, MD;  Location: [place];  Service: Orthopedics;  Laterality: Right;    There were no vitals filed for this visit.   Subjective Assessment - 03/31/21 1449     Subjective I had a near fall yesterday so I'm sore.    Currently in Pain? Yes    Pain Score 2     Pain Location Back    Pain Orientation Left    Pain Descriptors / Indicators Aching    Pain Type Chronic pain    Pain Onset More than a month ago    Pain Frequency Intermittent    Aggravating Factors  movement and lifting    Pain Relieving Factors rest, ice, medication, Gabapentin  [facility] Adult PT Treatment/Exercise - 03/31/21 0001       Lumbar Exercises: Stretches   Active Hamstring Stretch Right;Left;2 reps;30 seconds    Active Hamstring Stretch Limitations seated    Piriformis Stretch 2 reps;20 seconds      Lumbar Exercises: Aerobic   Nustep L5 10 min while discussing status   new model     Lumbar Exercises: Standing   Other Standing Lumbar Exercises same side UE/ knee lift 5x each side      Lumbar Exercises: Seated   Other Seated Lumbar Exercises 1 rounds seated yellow ball core series: hip to hip, shoulder to hip; rock the baby; ear to ear 7-8x each      Knee/Hip Exercises: Standing   Other Standing Knee Exercises weight shifting 3 ways on balance pad- abdominal bracing x1 min each      Knee/Hip Exercises: Seated   Long Arc Quad 10 reps;Left;Right    Long Arc Quad Weight 4 lbs.    Sit to Sand 5 reps   mat table + cushion holding 4.4# plyo ball                      PT Short Term Goals - 02/17/21 1441       PT SHORT TERM GOAL #2   Title pt to report no more than  5/10 pain with mobility in low back for improved tolerane to activity    Status On-going               PT Long Term Goals - 03/11/21 1408       PT LONG TERM GOAL #1   Title pt to demonstrate I with advanced HEP    Status Partially Met    Target Date 04/22/21      PT LONG TERM GOAL #2   Title pt to report ability to stand at least 20 mins with no more than 4/10 pain    Status Revised    Target Date 04/22/21      PT LONG TERM GOAL #3   Title pt to report ability to walk at least 10 mins at a time with no more than 4/10 pain    Status Revised    Target Date 04/22/21      PT LONG TERM GOAL #4   Title pt to demonstrate bil hip strength to 5/5 globally to improve functional mobility to allow for walking her dog and doing stairs    Time 10    Period Weeks    Status Revised    Target Date 04/22/21      PT LONG TERM GOAL #5   Title pt to report at least 89 on FOTO for improved functional mobility    Baseline 42    Time 10    Period Weeks    Status Partially Met    Target Date 04/22/21      Additional Long Term Goals   Additional Long Term Goals Yes      PT LONG TERM GOAL #6   Title Patient to be able to walk 1340 feet on 6 min walk test    Time 6    Period Weeks    Status New    Target Date 04/22/21                   Plan - 03/31/21 1508     Clinical Impression Statement Pt with some increased LBP today due to almost falling yesterday and  jarring the low back. The patient is able to perform graded activity with low reps and resistance to avoid exacerbation of symptoms.  She has numerous co-morbidities and chronic pain which require low dosing of exercise.  Pt is tapering visits to 1x/week for 1 month to promote independence with her HEP.  Therapist closely monitoring response throughout treatment session.    PT Frequency 2x / week    PT Treatment/Interventions ADLs/Self Care Home Management;Aquatic Therapy;Stair training;Functional mobility  training;Therapeutic activities;Therapeutic exercise;Balance training;Neuromuscular re-education;Manual techniques;Gait training;Patient/family education;Passive range of motion;Dry needling;Energy conservation;Taping    PT Next Visit Plan taper to 1x/wk,  continue gentle strength (lower level) to tolerance,  seated yellow ball core series;  tissue mobility and manual therapy/Addaday    PT Home Exercise Plan [email or web address]    Consulted and Agree with Plan of Care Patient             Patient will benefit from skilled therapeutic intervention in order to improve the following deficits and impairments:  Difficulty walking, Increased fascial restricitons, Increased muscle spasms, Decreased endurance, Decreased activity tolerance, Pain, Improper body mechanics, Impaired flexibility, Decreased mobility, Decreased strength, Postural dysfunction  Visit Diagnosis: Other abnormalities of gait and mobility  Muscle weakness (generalized)  Cramp and spasm  Abnormal posture     Problem List Patient Active Problem List   Diagnosis Date Noted   Intolerance of continuous positive airway pressure (CPAP) ventilation 06/17/2020   OSA (obstructive sleep apnea) 05/06/2020   Snoring 05/06/2020   Insomnia secondary to chronic pain 03/24/2020   History of sleep apnea 03/24/2020   Gasping for breath 03/24/2020   Abnormal glucose level 12/16/2019   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   Degeneration of lumbar intervertebral disc 12/16/2019   Hyperlipidemia 12/16/2019   Hypothyroidism 12/16/2019   Insomnia 12/16/2019   Migraine 12/16/2019   Overweight 12/16/2019   Postoperative hypothyroidism 12/16/2019   Vitamin D deficiency 12/16/2019   Hallux rigidus, right foot 12/05/2019   Hammer toe of right foot 12/05/2019   History of left ankle joint replacement 12/05/2019   Aftercare following ankle joint replacement surgery 01/10/2017   Anxiety, mild 12/12/2016   Atypical chest pain 12/12/2016   Cardiac  murmur 12/12/2016   GERD (gastroesophageal reflux disease) 12/12/2016   History of thyroid cancer 12/12/2016   Post-traumatic osteoarthritis of left ankle 10/03/2016   Chronic migraine without aura, with intractable migraine, so stated, with status migrainosus 09/26/2015   Joint pain 02/18/2015   Abnormal C-reactive protein 01/08/2015   Radiculopathy 03/06/2014   Osteoarthritis of right knee 04/01/2013   OA (osteoarthritis) 11/01/2011   OAB (overactive bladder)    Ulcerative colitis ([place])    Cancer (HCC)    Carpal tunnel syndrome    RA (rheumatoid arthritis) ([place])   Sigurd Sos, PT 03/31/21 3:27 PM   [place] @ [address] [place], [place], [address] Phone: [phone]   Fax:  [phone]  Name: Steven Veazie MRN: [id number] Date of Birth: [birth year]

## 2021-04-06 ENCOUNTER — Other Ambulatory Visit: Payer: Self-pay

## 2021-04-06 ENCOUNTER — Ambulatory Visit: Payer: Medicare Other

## 2021-04-06 DIAGNOSIS — R2689 Other abnormalities of gait and mobility: Secondary | ICD-10-CM | POA: Diagnosis not present

## 2021-04-06 DIAGNOSIS — M6281 Muscle weakness (generalized): Secondary | ICD-10-CM

## 2021-04-06 DIAGNOSIS — R252 Cramp and spasm: Secondary | ICD-10-CM

## 2021-04-06 DIAGNOSIS — R293 Abnormal posture: Secondary | ICD-10-CM

## 2021-04-06 NOTE — Therapy (Signed)
[facility] @ [address] [place], [place], [address] Phone: [phone]   Fax:  [phone]  Physical Therapy Treatment  Patient Details  Name: Terry Shannon MRN: [id number] Date of Birth: Sep 08, 1955 Referring Provider (PT): Normajean Glasgow, MD   Encounter Date: 04/06/2021   PT End of Session - 04/06/21 1251     Visit Number 19    Date for PT Re-Evaluation 04/22/21    Authorization Type Medicare    Authorization - Number of Visits 19    Progress Note Due on Visit 20    PT Start Time 1146    PT Stop Time 1228    PT Time Calculation (min) 42 min    Activity Tolerance Patient tolerated treatment well    Behavior During Therapy [facility] for tasks assessed/performed             Past Medical History:  Diagnosis Date   Allergy    year-round, pt. states   Chronic lower back pain    CMC arthritis, thumb, degenerative 01/9241   right   Complication of anesthesia    slow to wake up after gallbladder surgery   Depression    Eczema    ARMS AND HANDS   GERD (gastroesophageal reflux disease)    History of thyroid cancer    Hypothyroidism    Migraines    Osteoarthritis    Overactive bladder    RA (rheumatoid arthritis) ([place])    Sleep apnea    no CPAP use   Squamous cell carcinoma of skin 12/21/2017   in situ-mid chest (CX35FU)   Ulcerative colitis ([place])     Past Surgical History:  Procedure Laterality Date   ABDOMINAL HERNIA REPAIR  68/34/1962   periumbilical ventral hernia/incisional hernia   BUNIONECTOMY Right    x 2 more   BUNIONECTOMY Left    BUNIONECTOMY WITH CHILECTOMY Right 12/16/2005   CARPAL TUNNEL RELEASE Right 07/13/2001   CARPAL TUNNEL RELEASE Left 08/10/2001   DILATION AND CURETTAGE OF UTERUS     HAMMER TOE SURGERY Left may 2014   HYSTEROSCOPY WITH D & C  03/01/2004   with exc. of endometrial polyp   KNEE ARTHROSCOPY Right 2013   LAPAROSCOPIC CHOLECYSTECTOMY  11/03/2006   LUMBAR FUSION  03/06/2014   l4   l5    nerve ablation lumbar      THYROIDECTOMY, PARTIAL Right prior to 2002   THYROIDECTOMY, PARTIAL Left 11/29/2000   and isthmus   TONSILLECTOMY  1961   TOTAL ANKLE REPLACEMENT Left 11/2016   with tendon repair   TOTAL KNEE ARTHROPLASTY Right 04/01/2013   Procedure: RIGHT TOTAL KNEE ARTHROPLASTY;  Surgeon: Alta Corning, MD;  Location: [place];  Service: Orthopedics;  Laterality: Right;    There were no vitals filed for this visit.   Subjective Assessment - 04/06/21 1151     Subjective Last session was too much.  I had a migraine for 2 days.  I want to scale the exercises back.    Pertinent History Lt ankle replacement, mirganes (starting at base of skull on Lt side and ram horn pattern forward), cervical pain, multiple nerve ablations thorughout cervical and lumbar regions    Patient Stated Goals to have less pain so that I can start a walking program and begin walking my dog    Currently in Pain? Yes    Pain Score 2     Pain Location Back    Pain Orientation Left    Pain  Descriptors / Indicators Aching    Pain Type Chronic pain    Pain Radiating Towards Lt leg    Pain Onset More than a month ago    Pain Frequency Intermittent    Aggravating Factors  movement, doing too much, lifting    Pain Relieving Factors rest, ice, medication                               [facility] Adult PT Treatment/Exercise - 04/06/21 0001       Lumbar Exercises: Stretches   Active Hamstring Stretch Right;Left;2 reps;30 seconds    Active Hamstring Stretch Limitations seated    Piriformis Stretch 2 reps;20 seconds      Lumbar Exercises: Aerobic   Nustep L5 15 min while discussing status   new model     Lumbar Exercises: Seated   Other Seated Lumbar Exercises 1 rounds seated yellow ball core series: hip to hip, shoulder to hip; rock the baby; ear to ear 7-8x each      Knee/Hip Exercises: Seated   Long Arc Quad 10 reps;Left;Right    Long Arc Quad Weight 4 lbs.      Manual Therapy    Manual Therapy Soft tissue mobilization    Manual therapy comments Addaday to Lt gluteals/piriformis in sidelying  x 8 minutes                       PT Short Term Goals - 02/17/21 1441       PT SHORT TERM GOAL #2   Title pt to report no more than 5/10 pain with mobility in low back for improved tolerane to activity    Status On-going               PT Long Term Goals - 03/11/21 1408       PT LONG TERM GOAL #1   Title pt to demonstrate I with advanced HEP    Status Partially Met    Target Date 04/22/21      PT LONG TERM GOAL #2   Title pt to report ability to stand at least 20 mins with no more than 4/10 pain    Status Revised    Target Date 04/22/21      PT LONG TERM GOAL #3   Title pt to report ability to walk at least 10 mins at a time with no more than 4/10 pain    Status Revised    Target Date 04/22/21      PT LONG TERM GOAL #4   Title pt to demonstrate bil hip strength to 5/5 globally to improve functional mobility to allow for walking her dog and doing stairs    Time 10    Period Weeks    Status Revised    Target Date 04/22/21      PT LONG TERM GOAL #5   Title pt to report at least 50 on FOTO for improved functional mobility    Baseline 42    Time 10    Period Weeks    Status Partially Met    Target Date 04/22/21      Additional Long Term Goals   Additional Long Term Goals Yes      PT LONG TERM GOAL #6   Title Patient to be able to walk 1340 feet on 6 min walk test    Time 6    Period Weeks      Status New    Target Date 04/22/21                   Plan - 04/06/21 1200     Clinical Impression Statement Pt arrived with report that activity was too much last session and wants to scale back the activity.  The patient is able to perform graded activity with low reps and resistance to avoid exacerbation of symptoms.  Pt with increased Lt gluteal/leb pain and responded well to massager.  She has numerous co-morbidities and chronic  pain which require low dosing of exercise.  Pt is tapering visits to 1x/week    PT Frequency 1x / week    PT Duration Other (comment)    PT Treatment/Interventions ADLs/Self Care Home Management;Aquatic Therapy;Stair training;Functional mobility training;Therapeutic activities;Therapeutic exercise;Balance training;Neuromuscular re-education;Manual techniques;Gait training;Patient/family education;Passive range of motion;Dry needling;Energy conservation;Taping    PT Next Visit Plan taper to 1x/wk,  continue gentle strength (lower level) to tolerance,  seated yellow ball core series;  tissue mobility and manual therapy/Addaday    PT Home Exercise Plan [email or web address]    Consulted and Agree with Plan of Care Patient             Patient will benefit from skilled therapeutic intervention in order to improve the following deficits and impairments:  Difficulty walking, Increased fascial restricitons, Increased muscle spasms, Decreased endurance, Decreased activity tolerance, Pain, Improper body mechanics, Impaired flexibility, Decreased mobility, Decreased strength, Postural dysfunction  Visit Diagnosis: Other abnormalities of gait and mobility  Muscle weakness (generalized)  Cramp and spasm  Abnormal posture     Problem List Patient Active Problem List   Diagnosis Date Noted   Intolerance of continuous positive airway pressure (CPAP) ventilation 06/17/2020   OSA (obstructive sleep apnea) 05/06/2020   Snoring 05/06/2020   Insomnia secondary to chronic pain 03/24/2020   History of sleep apnea 03/24/2020   Gasping for breath 03/24/2020   Abnormal glucose level 12/16/2019   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   Degeneration of lumbar intervertebral disc 12/16/2019   Hyperlipidemia 12/16/2019   Hypothyroidism 12/16/2019   Insomnia 12/16/2019   Migraine 12/16/2019   Overweight 12/16/2019   Postoperative hypothyroidism 12/16/2019   Vitamin D deficiency 12/16/2019   Hallux rigidus,  right foot 12/05/2019   Hammer toe of right foot 12/05/2019   History of left ankle joint replacement 12/05/2019   Aftercare following ankle joint replacement surgery 01/10/2017   Anxiety, mild 12/12/2016   Atypical chest pain 12/12/2016   Cardiac murmur 12/12/2016   GERD (gastroesophageal reflux disease) 12/12/2016   History of thyroid cancer 12/12/2016   Post-traumatic osteoarthritis of left ankle 10/03/2016   Chronic migraine without aura, with intractable migraine, so stated, with status migrainosus 09/26/2015   Joint pain 02/18/2015   Abnormal C-reactive protein 01/08/2015   Radiculopathy 03/06/2014   Osteoarthritis of right knee 04/01/2013   OA (osteoarthritis) 11/01/2011   OAB (overactive bladder)    Ulcerative colitis ([place])    Cancer (HCC)    Carpal tunnel syndrome    RA (rheumatoid arthritis) ([place])    Sigurd Sos, PT 04/06/21 12:54 PM   [facility] @ [address] [place], [place], [address] Phone: [phone]   Fax:  [phone]  Name: Terry Shannon MRN: [id number] Date of Birth: Mar 28, 1956

## 2021-04-13 ENCOUNTER — Other Ambulatory Visit: Payer: Self-pay

## 2021-04-13 ENCOUNTER — Ambulatory Visit: Payer: Medicare Other

## 2021-04-13 DIAGNOSIS — M6281 Muscle weakness (generalized): Secondary | ICD-10-CM

## 2021-04-13 DIAGNOSIS — R293 Abnormal posture: Secondary | ICD-10-CM

## 2021-04-13 DIAGNOSIS — R252 Cramp and spasm: Secondary | ICD-10-CM | POA: Diagnosis not present

## 2021-04-13 DIAGNOSIS — R2689 Other abnormalities of gait and mobility: Secondary | ICD-10-CM

## 2021-04-13 NOTE — Therapy (Signed)
[facility] @ [address] [place], [place], [address] Phone: [phone]   Fax:  [phone]  Physical Therapy Treatment  Patient Details  Name: Terry Shannon MRN: [id number] Date of Birth: 06/25/1955 Referring Provider (PT): Normajean Glasgow, MD   Encounter Date: 04/13/2021 Progress Note Reporting Period 02/23/21 to 04/13/21  See note below for Objective Data and Assessment of Progress/Goals.      PT End of Session - 04/13/21 1230     Visit Number 20    Date for PT Re-Evaluation 04/22/21    Authorization Type Medicare    Authorization - Number of Visits 20    PT Start Time 7253    PT Stop Time 1230    PT Time Calculation (min) 43 min    Activity Tolerance Patient tolerated treatment well    Behavior During Therapy WFL for tasks assessed/performed             Past Medical History:  Diagnosis Date   Allergy    year-round, pt. states   Chronic lower back pain    CMC arthritis, thumb, degenerative 10/6438   right   Complication of anesthesia    slow to wake up after gallbladder surgery   Depression    Eczema    ARMS AND HANDS   GERD (gastroesophageal reflux disease)    History of thyroid cancer    Hypothyroidism    Migraines    Osteoarthritis    Overactive bladder    RA (rheumatoid arthritis) ([place])    Sleep apnea    no CPAP use   Squamous cell carcinoma of skin 12/21/2017   in situ-mid chest (CX35FU)   Ulcerative colitis ([place])     Past Surgical History:  Procedure Laterality Date   ABDOMINAL HERNIA REPAIR  34/74/2595   periumbilical ventral hernia/incisional hernia   BUNIONECTOMY Right    x 2 more   BUNIONECTOMY Left    BUNIONECTOMY WITH CHILECTOMY Right 12/16/2005   CARPAL TUNNEL RELEASE Right 07/13/2001   CARPAL TUNNEL RELEASE Left 08/10/2001   DILATION AND CURETTAGE OF UTERUS     HAMMER TOE SURGERY Left may 2014   HYSTEROSCOPY WITH D & C  03/01/2004   with exc. of endometrial polyp   KNEE  ARTHROSCOPY Right 2013   LAPAROSCOPIC CHOLECYSTECTOMY  11/03/2006   LUMBAR FUSION  03/06/2014   l4  l5    nerve ablation lumbar      THYROIDECTOMY, PARTIAL Right prior to 2002   THYROIDECTOMY, PARTIAL Left 11/29/2000   and isthmus   TONSILLECTOMY  1961   TOTAL ANKLE REPLACEMENT Left 11/2016   with tendon repair   TOTAL KNEE ARTHROPLASTY Right 04/01/2013   Procedure: RIGHT TOTAL KNEE ARTHROPLASTY;  Surgeon: Alta Corning, MD;  Location: [facility];  Service: Orthopedics;  Laterality: Right;    There were no vitals filed for this visit.   Subjective Assessment - 04/13/21 1151     Subjective I forgot that I am not supporsed to be twisting so I don't think I should be doing the seated ball exercises.    Pertinent History Lt ankle replacement, mirganes (starting at base of skull on Lt side and ram horn pattern forward), cervical pain, multiple nerve ablations thorughout cervical and lumbar regions    Patient Stated Goals to have less pain so that I can start a walking program and begin walking my dog    Currently in Pain? Yes    Pain Score 4  Pain Location Back    Pain Orientation Left    Pain Descriptors / Indicators Aching    Pain Type Chronic pain    Pain Onset More than a month ago    Pain Frequency Intermittent    Aggravating Factors  doing too much activity    Pain Relieving Factors rest, ice, medication                [facility] PT Assessment - 04/13/21 0001       Assessment   Medical Diagnosis M47.9 (ICD-10-CM) - Spondylosis, Lt hip pain    Referring Provider (PT) Normajean Glasgow, MD      Restrictions   Weight Bearing Restrictions No      Prior Function   Level of Independence Independent    Vocation Retired      Consulting civil engineer   Overall Strength Comments bil hip strength globally 4/5 with exception of Lt hip abduction 4-/5, trunk strength generally 4/5, knee strength 4/5                           [facility] Adult PT Treatment/Exercise - 04/13/21 0001        Lumbar Exercises: Stretches   Active Hamstring Stretch Right;Left;2 reps;30 seconds    Active Hamstring Stretch Limitations seated    Single Knee to Chest Stretch 2 reps;20 seconds    Piriformis Stretch 2 reps;20 seconds    Piriformis Stretch Limitations seated      Lumbar Exercises: Aerobic   Nustep L5 15 min while discussing status   new model     Lumbar Exercises: Supine   Ab Set 20 reps;5 seconds    AB Set Limitations with ball squeeze      Manual Therapy   Manual Therapy Soft tissue mobilization    Manual therapy comments Addaday to bil gluteals and lumbar spine in prone                       PT Short Term Goals - 02/17/21 1441       PT SHORT TERM GOAL #2   Title pt to report no more than 5/10 pain with mobility in low back for improved tolerane to activity    Status On-going               PT Long Term Goals - 04/13/21 1153       PT LONG TERM GOAL #1   Title pt to demonstrate I with advanced HEP    Status On-going      PT LONG TERM GOAL #2   Title pt to report ability to stand at least 20 mins with no more than 4/10 pain    Baseline 20-30 min- this varies    Status Achieved      PT LONG TERM GOAL #3   Title pt to report ability to walk at least 10 mins at a time with no more than 4/10 pain    Status Achieved      PT LONG TERM GOAL #4   Title pt to demonstrate bil hip strength to 5/5 globally to improve functional mobility to allow for walking her dog and doing stairs    Status On-going      PT LONG TERM GOAL #5   Title pt to report at least 50 on FOTO for improved functional mobility    Baseline 42    Status On-going      PT LONG TERM  GOAL #6   Title Patient to be able to walk 1340 feet on 6 min walk test    Status On-going                   Plan - 04/13/21 1202     Clinical Impression Statement Pt continues to request that we reduce activity each visit due to pain with doing too much activity.  Pt also requested to  eliminate the seated activity with weighted ball as she doesn't do well with rotation.  The patient is able to perform graded activity with low reps and resistance to avoid exacerbation of symptoms.  Pt with increased Lt gluteal/leg pain and responded well to massager.  She has numerous co-morbidities and chronic pain which require low dosing of exercise.  One more visit planned.    PT Frequency 1x / week    PT Treatment/Interventions ADLs/Self Care Home Management;Aquatic Therapy;Stair training;Functional mobility training;Therapeutic activities;Therapeutic exercise;Balance training;Neuromuscular re-education;Manual techniques;Gait training;Patient/family education;Passive range of motion;Dry needling;Energy conservation;Taping    PT Next Visit Plan 1 more visit.  Finalize HEP, final goal assessment    PT Home Exercise Plan [id number]    Consulted and Agree with Plan of Care Patient             Patient will benefit from skilled therapeutic intervention in order to improve the following deficits and impairments:  Difficulty walking, Increased fascial restricitons, Increased muscle spasms, Decreased endurance, Decreased activity tolerance, Pain, Improper body mechanics, Impaired flexibility, Decreased mobility, Decreased strength, Postural dysfunction  Visit Diagnosis: Other abnormalities of gait and mobility  Muscle weakness (generalized)  Cramp and spasm  Abnormal posture     Problem List Patient Active Problem List   Diagnosis Date Noted   Intolerance of continuous positive airway pressure (CPAP) ventilation 06/17/2020   OSA (obstructive sleep apnea) 05/06/2020   Snoring 05/06/2020   Insomnia secondary to chronic pain 03/24/2020   History of sleep apnea 03/24/2020   Gasping for breath 03/24/2020   Abnormal glucose level 12/16/2019   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   Degeneration of lumbar intervertebral disc 12/16/2019   Hyperlipidemia 12/16/2019   Hypothyroidism  12/16/2019   Insomnia 12/16/2019   Migraine 12/16/2019   Overweight 12/16/2019   Postoperative hypothyroidism 12/16/2019   Vitamin D deficiency 12/16/2019   Hallux rigidus, right foot 12/05/2019   Hammer toe of right foot 12/05/2019   History of left ankle joint replacement 12/05/2019   Aftercare following ankle joint replacement surgery 01/10/2017   Anxiety, mild 12/12/2016   Atypical chest pain 12/12/2016   Cardiac murmur 12/12/2016   GERD (gastroesophageal reflux disease) 12/12/2016   History of thyroid cancer 12/12/2016   Post-traumatic osteoarthritis of left ankle 10/03/2016   Chronic migraine without aura, with intractable migraine, so stated, with status migrainosus 09/26/2015   Joint pain 02/18/2015   Abnormal C-reactive protein 01/08/2015   Radiculopathy 03/06/2014   Osteoarthritis of right knee 04/01/2013   OA (osteoarthritis) 11/01/2011   OAB (overactive bladder)    Ulcerative colitis ([place])    Cancer (HCC)    Carpal tunnel syndrome    RA (rheumatoid arthritis) ([place])     Sigurd Sos, PT 04/13/21 12:33 PM   [facility] @ [address] [place], [place], [address] Phone: [phone]   Fax:  [phone]  Name: Terry Shannon MRN: [id number] Date of Birth: 08-01-1955

## 2021-04-20 ENCOUNTER — Ambulatory Visit: Payer: Medicare Other

## 2021-04-20 ENCOUNTER — Other Ambulatory Visit: Payer: Self-pay

## 2021-04-20 DIAGNOSIS — R293 Abnormal posture: Secondary | ICD-10-CM | POA: Diagnosis not present

## 2021-04-20 DIAGNOSIS — M6281 Muscle weakness (generalized): Secondary | ICD-10-CM

## 2021-04-20 DIAGNOSIS — R2689 Other abnormalities of gait and mobility: Secondary | ICD-10-CM

## 2021-04-20 DIAGNOSIS — R252 Cramp and spasm: Secondary | ICD-10-CM | POA: Diagnosis not present

## 2021-04-20 NOTE — Therapy (Signed)
[facility] @ [address] [place], [place], [address] Phone: [phone]   Fax:  [phone]  Physical Therapy Treatment  Patient Details  Name: Terry Shannon MRN: [id number] Date of Birth: 1955/10/24 Referring Provider (PT): Normajean Glasgow, MD   Encounter Date: 04/20/2021   PT End of Session - 04/20/21 1257     Visit Number 21    Authorization Type Medicare    PT Start Time 6160    PT Stop Time 1311    PT Time Calculation (min) 41 min    Activity Tolerance Patient tolerated treatment well    Behavior During Therapy [facility] for tasks assessed/performed             Past Medical History:  Diagnosis Date   Allergy    year-round, pt. states   Chronic lower back pain    CMC arthritis, thumb, degenerative 11/3708   right   Complication of anesthesia    slow to wake up after gallbladder surgery   Depression    Eczema    ARMS AND HANDS   GERD (gastroesophageal reflux disease)    History of thyroid cancer    Hypothyroidism    Migraines    Osteoarthritis    Overactive bladder    RA (rheumatoid arthritis) ([place])    Sleep apnea    no CPAP use   Squamous cell carcinoma of skin 12/21/2017   in situ-mid chest (CX35FU)   Ulcerative colitis ([place])     Past Surgical History:  Procedure Laterality Date   ABDOMINAL HERNIA REPAIR  62/69/4854   periumbilical ventral hernia/incisional hernia   BUNIONECTOMY Right    x 2 more   BUNIONECTOMY Left    BUNIONECTOMY WITH CHILECTOMY Right 12/16/2005   CARPAL TUNNEL RELEASE Right 07/13/2001   CARPAL TUNNEL RELEASE Left 08/10/2001   DILATION AND CURETTAGE OF UTERUS     HAMMER TOE SURGERY Left may 2014   HYSTEROSCOPY WITH D & C  03/01/2004   with exc. of endometrial polyp   KNEE ARTHROSCOPY Right 2013   LAPAROSCOPIC CHOLECYSTECTOMY  11/03/2006   LUMBAR FUSION  03/06/2014   l4  l5    nerve ablation lumbar      THYROIDECTOMY, PARTIAL Right prior to 2002   THYROIDECTOMY, PARTIAL Left  11/29/2000   and isthmus   TONSILLECTOMY  1961   TOTAL ANKLE REPLACEMENT Left 11/2016   with tendon repair   TOTAL KNEE ARTHROPLASTY Right 04/01/2013   Procedure: RIGHT TOTAL KNEE ARTHROPLASTY;  Surgeon: Alta Corning, MD;  Location: [facility];  Service: Orthopedics;  Laterality: Right;    There were no vitals filed for this visit.   Subjective Assessment - 04/20/21 1228     Subjective I can tell that I have made progress becasue I can walk the dog 1/2 block and back and I don't have pain.  I will keep doing my exercises after D/C.    Pertinent History Lt ankle replacement, mirganes (starting at base of skull on Lt side and ram horn pattern forward), cervical pain, multiple nerve ablations thorughout cervical and lumbar regions    Patient Stated Goals to have less pain so that I can start a walking program and begin walking my dog                [facility] PT Assessment - 04/20/21 0001       Assessment   Medical Diagnosis M47.9 (ICD-10-CM) - Spondylosis, Lt hip pain    Referring Provider (PT)  Normajean Glasgow, MD      Prior Function   Level of Independence Independent    Vocation Retired      Cognition   Overall Cognitive Status Within Functional Limits for tasks assessed      Observation/Other Assessments   Focus on Therapeutic Outcomes (FOTO)  1                           [facility] Adult PT Treatment/Exercise - 04/20/21 0001       Lumbar Exercises: Stretches   Active Hamstring Stretch Right;Left;2 reps;30 seconds    Active Hamstring Stretch Limitations seated    Single Knee to Chest Stretch 2 reps;20 seconds    Piriformis Stretch 2 reps;20 seconds    Piriformis Stretch Limitations seated      Lumbar Exercises: Aerobic   Nustep L5 15 min while discussing status   new model     Lumbar Exercises: Supine   Ab Set 20 reps;5 seconds    AB Set Limitations with ball squeeze      Manual Therapy   Manual Therapy Soft tissue mobilization    Manual therapy comments  Addaday to bil gluteals and lumbar spine in prone                       PT Short Term Goals - 02/17/21 1441       PT SHORT TERM GOAL #2   Title pt to report no more than 5/10 pain with mobility in low back for improved tolerane to activity    Status On-going               PT Long Term Goals - 04/20/21 1229       PT LONG TERM GOAL #1   Title pt to demonstrate I with advanced HEP    Status Achieved      PT LONG TERM GOAL #2   Title pt to report ability to stand at least 20 mins with no more than 4/10 pain    Baseline 20-30 min- this varies    Status Achieved      PT LONG TERM GOAL #3   Title pt to report ability to walk at least 10 mins at a time with no more than 4/10 pain    Status Achieved      PT LONG TERM GOAL #4   Title pt to demonstrate bil hip strength to 5/5 globally to improve functional mobility to allow for walking her dog and doing stairs    Status Partially Met      PT LONG TERM GOAL #5   Title pt to report at least 50 on FOTO for improved functional mobility    Baseline 44    Status Partially Met      PT LONG TERM GOAL #6   Title Patient to be able to walk 1340 feet on 6 min walk test    Baseline deferred due to limited activity tolerance    Status Deferred                   Plan - 04/20/21 1257     Clinical Impression Statement Pt is ready to D/C to HEP.  Pt reports functional gains with ability to walk her dog with reduced pain. Pt continues to request that we reduce activity each visit due to pain with doing too much activity.   The patient is able to perform graded activity  with low reps and resistance to avoid exacerbation of symptoms.  Pt with increased Lt gluteal/leg pain and responded well to massager.  She has numerous co-morbidities and chronic pain which require low dosing of exercise and will continue with this after D/C today.    PT Next Visit Plan D/C PT to [facility] and  Agree with Plan of Care Patient             Patient will benefit from skilled therapeutic intervention in order to improve the following deficits and impairments:     Visit Diagnosis: Other abnormalities of gait and mobility  Muscle weakness (generalized)  Cramp and spasm  Abnormal posture     Problem List Patient Active Problem List   Diagnosis Date Noted   Intolerance of continuous positive airway pressure (CPAP) ventilation 06/17/2020   OSA (obstructive sleep apnea) 05/06/2020   Snoring 05/06/2020   Insomnia secondary to chronic pain 03/24/2020   History of sleep apnea 03/24/2020   Gasping for breath 03/24/2020   Abnormal glucose level 12/16/2019   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   Degeneration of lumbar intervertebral disc 12/16/2019   Hyperlipidemia 12/16/2019   Hypothyroidism 12/16/2019   Insomnia 12/16/2019   Migraine 12/16/2019   Overweight 12/16/2019   Postoperative hypothyroidism 12/16/2019   Vitamin D deficiency 12/16/2019   Hallux rigidus, right foot 12/05/2019   Hammer toe of right foot 12/05/2019   History of left ankle joint replacement 12/05/2019   Aftercare following ankle joint replacement surgery 01/10/2017   Anxiety, mild 12/12/2016   Atypical chest pain 12/12/2016   Cardiac murmur 12/12/2016   GERD (gastroesophageal reflux disease) 12/12/2016   History of thyroid cancer 12/12/2016   Post-traumatic osteoarthritis of left ankle 10/03/2016   Chronic migraine without aura, with intractable migraine, so stated, with status migrainosus 09/26/2015   Joint pain 02/18/2015   Abnormal C-reactive protein 01/08/2015   Radiculopathy 03/06/2014   Osteoarthritis of right knee 04/01/2013   OA (osteoarthritis) 11/01/2011   OAB (overactive bladder)    Ulcerative colitis (HCC)    Cancer (HCC)    Carpal tunnel syndrome    RA (rheumatoid arthritis) ([place])   PHYSICAL THERAPY DISCHARGE SUMMARY  Visits from Start of Care: 21  Current functional  level related to goals / functional outcomes: See above for current status.     Remaining deficits: Chronic pain and endurance limitations.  Pt has appropriate HEP in place and will continue to work on this at home.    Education / Equipment: HEP, pacing of exercise   Patient agrees to discharge. Patient goals were partially met. Patient is being discharged due to being pleased with the current functional level.  Sigurd Sos, PT 04/20/21 1:13 PM   [facility] @ [address] [place], [place], [address] Phone: [phone]   Fax:  [phone]  Name: Terry Shannon MRN: [id number] Date of Birth: [birth year]

## 2021-04-25 ENCOUNTER — Encounter: Payer: Self-pay | Admitting: Neurology

## 2021-04-27 ENCOUNTER — Other Ambulatory Visit: Payer: Self-pay

## 2021-04-27 ENCOUNTER — Encounter: Payer: Self-pay | Admitting: Neurology

## 2021-04-27 ENCOUNTER — Ambulatory Visit (INDEPENDENT_AMBULATORY_CARE_PROVIDER_SITE_OTHER): Payer: Medicare Other | Admitting: Neurology

## 2021-04-27 VITALS — BP 179/92 | HR 71 | Ht 66.0 in | Wt 195.5 lb

## 2021-04-27 DIAGNOSIS — Z9989 Dependence on other enabling machines and devices: Secondary | ICD-10-CM | POA: Diagnosis not present

## 2021-04-27 DIAGNOSIS — G4733 Obstructive sleep apnea (adult) (pediatric): Secondary | ICD-10-CM

## 2021-04-27 DIAGNOSIS — M05739 Rheumatoid arthritis with rheumatoid factor of unspecified wrist without organ or systems involvement: Secondary | ICD-10-CM

## 2021-04-27 MED ORDER — ZOLPIDEM TARTRATE ER 12.5 MG PO TBCR: 12.5000 mg | EXTENDED_RELEASE_TABLET | Freq: Every day | ORAL | 1 refills | Status: DC

## 2021-04-27 MED ORDER — SUMATRIPTAN SUCCINATE 100 MG PO TABS: ORAL_TABLET | ORAL | 11 refills | Status: DC

## 2021-04-27 NOTE — Progress Notes (Signed)
SLEEP MEDICINE CLINIC    Provider:  Larey Seat, MD  Primary Care Physician:  Willey Blade, [place] [address]     Referring Provider: Dr. Jaynee Eagles, MD        Chief Complaint according to patient   Patient presents with:     New Patient (Initial Visit)     Has tried cpap in the past but could not keep it on,      HISTORY OF PRESENT ILLNESS:  Terry Shannon is [name] patient seen here as RV on 04/27/2021. I have the pleasure of seeing Terry Shannon today following her in January retesting for sleep apnea and a titration study to positive airway pressure therapy.  She had in the past severe difficulties with tolerating CPAP and we had actually also investigated alternatives to CPAP use.  She was diagnosed with an AHI of 28.9 which is still moderately severe obstructive sleep apnea REM sleep accentuation to 69.5/h this REM AHI made inspire not an alternative treatment option.  She also had over 50 minutes of low oxygen was in the total sleep time.  She responded very well to a titration of 8 cmH2O pressure and she recovered REM sleep when this pressure was applied during the sleep study.  The patient has been able to use her autotitrator at home and I have been impressed.  She has a 93% compliance 28 out of 30 days and on the majority of the days she can use the machine for over 4 hours.  Average user time is 4 hours 32 minutes and she is still trend wise improving on using it longer.  The minimum pressure was set at 5 the maximum at 12 cmH2O this to centimeter expiratory pressure relief.  Her residual AHI is 2.8/h which is a great improvement in comparison to her baseline and she has no Cheyne-Stokes respirations arising.  Air leak is moderate 23.8 L/min pressure at the 95th percentile is 10.5 cm water..     So there is 2 things I will do today : Because the patient would like to feel the airflow immediately I  will cut out her ramp function #1 #2 I will open the maximum pressure to 14 cm water.       Chief concern according to patient : Chief complaint according to patient : " Most bothering as the correlation of migraines sometimes lasting 3 days be triggered by exercise, especially upper body exercise" Migraine   Interval history 12/15/2020: [name] with chronic migraines, she has failed multiple medications, she also has exercise-induced headaches, she is on propranolol, zonisamide, we sent  her dry needling, she is not using her CPAP and I did discuss that with her - see below.  She takes Imitrex and Reglan at onset of headache.  She is tried and failed multiple medications:Topamax, amitriptyline, gabapentin, Flexeril, propranolol, Pristiq, Reglan, tylenol, nsaids, asa, and she has tried a lot that she can't remember and was on these for years, stopped working.Zonegran .  She was doing well on propranolol and zonogram and we started Botox. And she stopped that Baseline is daily migraines and headaches.We also tried emgality but she stopped that. Dry needling helped a lot. Still on propranolol, gabapentin, flexeril and stopped the zonisamide and the emgality but migraines seem to be under control.     Migraines, had several botox treatments, botox did not really help  but she feels her migraines are overll better a few a month and taking acute management sumatriptan.medication but of course hers was more exertional and she hasn't been exerting herself due to neck and back pain.    Fatigue and prior diagnosis of OSA: Was referred back to sleep studies Dr. Brett Fairy and she was seen and evaluated 03/24/2020 and restested Moderately Severe Obstructive Sleep Apnea (OSA) with an AHI of 28.9/h and with strong REM and supine position dependency.REM AHI was 69.5 /hour, supine AHI was 58.1/h. Working on getting a cpap or inspire.           Chronic neck and low back pains:  , she has chronic  neck pain and we sent her to physical therapy and dry needling, she has had it for many years, last MRI in 01/2020 (personally reviewed images) showed multilevel spondylosis and degenerative changes, she has tried other conservative measures,  and pursued interventional treatment such as epidural steroid injections, medial branch blocks, or other possibly surgical.Dry needling helped and the exercises helped. Repeated MRI cervical spine and then send to Dr. Mina Marble for interventional procedures for her severe neck pain  and is feeling better. She also has low back pain and is having PT and getting injections there.   MRI cervical spine  01/2020:  IMPRESSION:   MRI cervical spine (without) demonstrating: - Degenerative spondylosis and disc bulging from C4-5 to T2-3. No spinal stenosis or foraminal narrowing. - Mild chronic small vessel ischemic disease in the pons.   Interval history 07/01/19: 64 year old with a past medical history sleep apnea, rheumatoid arthritis, osteoarthritis, chronic migraines for over 2 decades (when I initially saw her quality, frequency, severity and triggers had not significantly changed in many years), hypothyroidism, depression, chronic low back pain. this is a patient I been seeing for several years, chronic migraines and exertional headaches, she has been doing extremely well on propranolol and zonisamide, her blood pressure has been a little elevated, I last visit we increase propranolol to help with headaches as well as blood pressure.  She is also on zonisamide for her migraines.  Reglan and Imitrex as tolerated for acute management.  Today she is not doing as well. She reports her neck is worsening, she can walk her dog around the block and she has a migraine, any movement with her upper body can cause a migraine, she had a nerve ablation in the low back last year and she is going to see Dr. Jacelyn Grip.  She is having daily migraines and headaches, migraines can be moderately severe to  severe, they can last upwards of 24 hours, no aura, no medication overuse, ongoing for the last year at the severity and frequency, worsening exertional quality, she can get a headache even folding close.  This is concerning we need imaging.Will start botox.  Tried: Topamax, amitriptyline, gabapentin, Flexeril, propranolol, Pristiq, Reglan, tylenol, nsaids, asa, and she has tried a lot that she can't remember and was on these for years, stopped working.Taking Zonegran now.  Interval history 10/11/2018: Patient continues to do well.  She still has exertional headaches when she walks and her blood pressure runs "a little on the high side".  We discussed increasing the propranolol to 80 mg as this is a very good headache/migraine medication, and can help with her slightly elevated high blood pressure.  We also discussed she takes the benzodiazepine very rarely, before procedures, sometimes for anxiety, advised her to continue to take it very sparingly and situationally, discussed addiction and  overuse and risks of respiratory depression.  Also discussed side effects of propranolol.  We will continue her zonisamide for her migraines.  We will continue Reglan and Imitrex as tolerated for acute management. The headaches start in the back of her neck, it gets tight then spreads to the left side of her head. It is poinding, throbbing, pain behind the eye temple pounding, +light sensitivity, +sound sensitivity, + nausea, no vomiting in over 10 years. Another trigger is exercise. When she exercise she will get a migraine that day.    I remember Kacee Sukhu I have seen her 12 years ago I think I have seen her last about 6 years ago, and this was for migraine treatment. She retired about 4 years ago. 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 She has responded to Imitrex on and off but she states that it seems to not work long enough or at times would not at all. In  addition she Flexeril to control neck pain. She has noticed a strong correlation between exercise and migraine onset usually she exercises in daytime comes home and the migraine sets on within 2 or 3 hours later. Many of these migraines the last more than a day. The Imitrex is no longer helping as much. Trigger points seem always left paraspinal, occipital and into the temporal area, radiating above the eye. The patient had noted that sometimes taking Flexeril before exercising seems to help the headaches not to come on. She used to get migraines associated with rather significant nausea, but this is no longer the case. She has not vomited because of migraines in many years. She sees occasionally floaters or flickers but she has not noticed this as a strong correlation to an onset of migraine / AURA. Over a decade ago she was already testing dietary triggers and learnt that she cannot drink red wine, aspartame or other sweeteners can cause her to have a migraine. She not longer tolerate Beer, but used to.  She has migraine at least 4 -5 times a month, 2 a week.  She has chronic insomnia.  The patient remembers that she was placed on gabapentin after back surgery this however also eliminated the trigger of exercise and she felt "okay while she was taking gabapentin. She has been on Depakote about a decade ago and also on topiramate. None of these have ever eliminated her headaches. She is also looking for potential trigger point injections, infusion or Botox therapy.      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 Her migraines persisted while using CPAP-    She has a medical history of Rheumatoid arthritis- autoimmune- fatigue. Allergy, Chronic lower back pain due to DJD/ spine DDD with rheumatoid arthritis, thumb, degenerative (06/3534), Complication of anesthesia, Depression, Eczema, GERD  (gastroesophageal reflux disease), History of thyroid cancer, Hypothyroidism, ankle joint replacement.  Chronic and frequent Migraines, Overactive bladder does not a cause nocturia or enuresis.  Untreated Sleep apnea, Squamous cell carcinoma of skin (12/21/2017), and Ulcerative colitis ([place]).  The patient had the first sleep study in the year 2009 or earlier. I don't have the results. She continues to present at home there  Is Gasping/ Snoring/ Breath holding.  She takes Ambien daily/ nightly and sleeps 7 hours.    Sleep relevant medical history:    Family medical /sleep history: 2  brothers  with OSA, nobody with insomnia, mother had rheumatoid arthritis, too.  Ambien induced sleep walking/ eating.    Social history:  Patient is retired from Conservation officer, nature, activities and Marketing executive, and lives in a household with her partner, her step daughter and husband have meanwhile moved out, another step daughter and her boyfriend moved out.  Family status is living in a same sex relationship. Pets are present. Tobacco use; never .   ETOH use ; 2 drinks a week. Caffeine intake in form of Coffee( 1 cup) no Soda,Tea,or energy drinks. Regular exercise in form of swimming before the pandemic, neck therapy.     Sleep habits are as follows: The patient's dinner time is between 6 and 6. 45   PM.  Another snack at round 9 PM,  The patient takes Ambien at midnight- goes to bed at 1 AM and continues to sleep for 6-7 hours, doesn't wake for  bathroom breaks.The preferred sleep position is both sides , with the support of one pillow.  Dreams are reportedly rare. 8-9 , rarely 10 AM is the usual rise time. The patient wakes up spontaneously.  She reports  feeling somewhat refreshed or restored in AM, with symptoms such as dry mouth- no morning headaches, and residual fatigue.  Naps are taken very infrequently, disturbing her  nocturnal sleep.    Review of Systems: Out of a complete 14 system review, the  patient complains of only the following symptoms, and all other reviewed systems are negative.:  Fatigue, sleepiness , snoring,  Insomnia - ambien dependent.   Nasal pillow interface.   Migraine at least 5 a month ,    How likely are you to doze in the following situations: 0 = not likely, 1 = slight chance, 2 = moderate chance, 3 = high chance   Sitting and Reading? Watching Television? Sitting inactive in a public place (theater or meeting)? As a passenger in a car for an hour without a break? Lying down in the afternoon when circumstances permit? Sitting and talking to someone? Sitting quietly after lunch without alcohol? In a car, while stopped for a few minutes in traffic?   Total = 2/ 24 points   FSS endorsed at 34/ 63 points.  GDS - depression score - controlled on Pristiq . Migraine better controlled with Ahern's treatments.  Imitrex 7 / months.   Social History   Socioeconomic History   Marital status: Significant Other    Spouse name: Not on file   Number of children: 0   Years of education: Not on file   Highest education level: Bachelor's degree (e.g., BA, AB, BS)  Occupational History   Not on file  Tobacco Use   Smoking status: Never   Smokeless tobacco: Never  Vaping Use   Vaping Use: Never used  Substance and Sexual Activity   Alcohol use: Yes    Comment: 2-3 drinks/week   Drug use: No   Sexual activity: Not Currently    Birth control/protection: None  Other Topics Concern   Not on file  Social History Narrative   Lives at home with her partner and two daughters   Right handed   Caffeine; 1 cup/daily.     Social Determinants of Health   Financial Resource Strain: Not on file  Food Insecurity: Not on file  Transportation Needs: Not on file  Physical Activity: Not on file  Stress: Not on file  Social Connections: Not on file    Family History  Problem Relation Age of Onset   Stroke Mother    Cancer Father 38       lung   Diabetes  Brother    Barrett's esophagus Brother    Cancer Maternal Grandmother 74       colon   Stroke Maternal Grandmother    Stroke Maternal Grandfather    Diabetes Paternal Grandmother     Past Medical History:  Diagnosis Date   Allergy    year-round, pt. states   Chronic lower back pain    CMC arthritis, thumb, degenerative 07/2353   right   Complication of anesthesia    slow to wake up after gallbladder surgery   Depression    Eczema    ARMS AND HANDS   GERD (gastroesophageal reflux disease)    History of thyroid cancer    Hypothyroidism    Migraines    Osteoarthritis    Overactive bladder    RA (rheumatoid arthritis) ([place])    Sleep apnea    no CPAP use   Squamous cell carcinoma of skin 12/21/2017   in situ-mid chest (CX35FU)   Ulcerative colitis ([place])     Past Surgical History:  Procedure Laterality Date   ABDOMINAL HERNIA REPAIR  73/22/0254   periumbilical ventral hernia/incisional hernia   BUNIONECTOMY Right    x 2 more   BUNIONECTOMY Left    BUNIONECTOMY WITH CHILECTOMY Right 12/16/2005   CARPAL TUNNEL RELEASE Right 07/13/2001   CARPAL TUNNEL RELEASE Left 08/10/2001   DILATION AND CURETTAGE OF UTERUS     HAMMER TOE SURGERY Left may 2014   HYSTEROSCOPY WITH D & C  03/01/2004   with exc. of endometrial polyp   KNEE ARTHROSCOPY Right 2013   LAPAROSCOPIC CHOLECYSTECTOMY  11/03/2006   LUMBAR FUSION  03/06/2014   l4  l5    nerve ablation lumbar      THYROIDECTOMY, PARTIAL Right prior to 2002   THYROIDECTOMY, PARTIAL Left 11/29/2000   and isthmus   TONSILLECTOMY  1961   TOTAL ANKLE REPLACEMENT Left 11/2016   with tendon repair   TOTAL KNEE ARTHROPLASTY Right 04/01/2013   Procedure: RIGHT TOTAL KNEE ARTHROPLASTY;  Surgeon: Alta Corning, MD;  Location: [facility];  Service: Orthopedics;  Laterality: Right;     Current Outpatient Medications on File Prior to Visit  Medication Sig Dispense Refill   ALPRAZolam (XANAX) 0.5 MG tablet Take only before procedures (injections  into the neck and low back) up to 3x a day 30 tablet 0   Ascorbic Acid (VITAMIN C PO) Take 6,000 mg by mouth daily.     aspirin EC 325 MG tablet Take 325 mg by mouth 2 (two) times daily as needed for mild pain.      B Complex-C (SUPER B COMPLEX PO) Take 1 tablet by mouth daily.     Cholecalciferol (VITAMIN D3 PO) Take 1 tablet by mouth daily.      cyclobenzaprine (FLEXERIL) 10 MG tablet Take 10 mg by mouth 3 (three) times daily as needed for muscle spasms.     desvenlafaxine (PRISTIQ) 100 MG 24 hr tablet Take 100 mg by mouth daily.      Flax OIL Take by mouth daily.     gabapentin (NEURONTIN) 300 MG capsule Take 300 mg by mouth 4 (four) times daily.      golimumab (SIMPONI ARIA) 50 MG/4ML SOLN injection See admin instructions.     levothyroxine (SYNTHROID) 50 MCG tablet Take 50 mcg by mouth daily before breakfast.  mesalamine (LIALDA) 1.2 G EC tablet Take 2.4 g by mouth 2 (two) times daily.      oxybutynin (DITROPAN-XL) 10 MG 24 hr tablet Take 10 mg by mouth daily.     pantoprazole (PROTONIX) 40 MG tablet Take 40 mg by mouth daily.     Probiotic Product (PROBIOTIC DAILY PO) Take 1 tablet by mouth daily.      propranolol (INDERAL) 10 MG tablet TAKE 1 TABLET 30-60 MINUTES PRIOR TO EXERCISE FOR EXERCISE-INDUCED HEADACHES. 90 tablet 1   propranolol ER (INDERAL LA) 80 MG 24 hr capsule Take 1 capsule (80 mg total) by mouth at bedtime. For migraine prevention and high blood pressure. 90 capsule 4   RESTASIS MULTIDOSE 0.05 % ophthalmic emulsion Place 1 drop into both eyes 2 (two) times daily.     Rosuvastatin Calcium (CRESTOR PO) Take 20 mg by mouth daily.      SUMAtriptan (IMITREX) 100 MG tablet TAKE 1 TABLET BY MOUTH ONCE DAILY AS NEEDED. MAY REPEAT IN 2 HOURS IF HEADACHE PERSISTS OR RECURS. 10 tablet 11   thyroid (ARMOUR) 60 MG tablet Take 60 mg by mouth daily before breakfast.     zolpidem (AMBIEN CR) 12.5 MG CR tablet Take 12.5 mg by mouth at bedtime.     zonisamide (ZONEGRAN) 100 MG capsule  Take 1 capsule (100 mg total) by mouth daily. For migraine prevention. 90 capsule 3   No current facility-administered medications on file prior to visit.    Allergies  Allergen Reactions   Codeine Nausea And Vomiting   Hydrocodone Nausea And Vomiting   Ibuprofen Other (See Comments)    MIGRAINES   Iodinated Diagnostic Agents Hives and Itching   Methotrexate Derivatives Other (See Comments)    GI UPSET   Oxycodone Nausea And Vomiting   Tylenol [Acetaminophen] Other (See Comments)    MIGRAINES   Aleve [Naproxen Sodium]    Sulfa Antibiotics    Neosporin [Neomycin-Bacitracin Zn-Polymyx] Swelling    AT SITE    Physical exam:  Today's Vitals   04/27/21 1457  BP: (!) 179/92  Pulse: 71  Weight: 195 lb 8 oz (88.7 kg)  Height: 5\' 6"  (1.676 m)   Body mass index is 31.55 kg/m.   Wt Readings from Last 3 Encounters:  04/27/21 195 lb 8 oz (88.7 kg)  12/15/20 193 lb 9.6 oz (87.8 kg)  03/24/20 183 lb (83 kg)     Ht Readings from Last 3 Encounters:  04/27/21 5\' 6"  (1.676 m)  12/15/20 5\' 6"  (1.676 m)  03/24/20 5\' 6"  (1.676 m)      General: The patient is awake, alert and appears not in acute distress. The patient is well groomed. Head: Normocephalic, atraumatic. Neck is not supple.  Mallampati 1,  neck circumference:14.5  inches .  Nasal airflow patent.  Retrognathia is not seen.  TMJ click and jaw pain.  Dental status: intact  Cardiovascular:  Regular rate and cardiac rhythm by pulse,  without distended neck veins. Respiratory: Lungs are clear to auscultation.  Skin:  Without evidence of ankle edema, or rash. Trunk: The patient's posture is stooped, her shoulders are droopy.    Neurologic exam : The patient is awake and alert, oriented to place and time.   Memory subjective described as intact.  Attention span & concentration ability appears normal.  Speech is fluent,  without  dysarthria, dysphonia or aphasia.  Mood and affect are appropriate.   Cranial nerves: no  loss of smell or taste reported  Pupils are  equal and briskly reactive to light. Funduscopic exam deferred. .  Extraocular movements in vertical and horizontal planes were intact  No Diplopia. Visual fields by finger perimetry are intact. Hearing is impaired. Facial motor strength is symmetric and tongue and uvula move midline.  Neck ROM : rotation, tilt and flexion extension were normal for age and shoulder shrug was symmetrical.    Motor exam:  Symmetric bulk, tone and ROM.   Normal tone without cog -wheeling, symmetric grip strength . Left wrist in a brace.    Sensory:  Fine touch, pinprick and vibration were tested,   Vibration reduced in feet and ankles - upper extremities and hands were normal.  Proprioception tested in the upper extremities was normal.   Coordination: Rapid alternating movements in the fingers/hands were of normal speed.  She reports changes I handwriting , difficulties to hold the pen- rheumatic joints.  The Finger-to-nose maneuver was intact without evidence of ataxia, dysmetria or tremor.   Gait and station: Patient could rise unassisted from a seated position, walked without assistive device.  Stance is of normal width/ base and the patient turned with steps.  Toe and heel walk were deferred.  Deep tendon reflexes: in the  upper and lower extremities are symmetric and intact.  Babinski response was deferred .       After spending a total time of 45 minutes face to face and additional time for physical and neurologic examination, review of laboratory studies,  personal review of imaging studies, reports and results of other testing and review of referral information / records as far as provided in visit, I have established the following assessments:  1)  Patient with predominantly fatigue, but not sleepiness. OSA was found, and is controlled on CPAP  2) Migraines are present for years - but not waking her or being present when she wakes.  3) multiple  autoimmune disorders, chronic pain- all contributing to fatigue and insomnia.    My Plan is to proceed with:  1)OSA, REM sleep dependent. So there is 2 things I will do today : Because the patient would like to feel the airflow immediately I will cut out her ramp function #1 #2 I will open the maximum pressure to 14 cm water.  I would like to thank Dr Jaynee Eagles  for allowing me to meet with and to take care of this pleasant patient.   In short, Terry Shannon is presenting with insomnia and fatigue, chronic , and now with well controlled OSA- improved migraines.   I plan to follow up either personally or through our NP within 12 month.  .  Electronically signed by: Larey Seat, MD 04/27/2021 3:28 PM  [facility] Board certified by The [employer] of Sleep Medicine and Diplomate of the [employer] of Sleep Medicine. Board certified In Neurology through the [place], Fellow of the [employer] of Neurology. Medical Director of [facility].

## 2021-05-03 DIAGNOSIS — E890 Postprocedural hypothyroidism: Secondary | ICD-10-CM | POA: Diagnosis not present

## 2021-05-03 DIAGNOSIS — E559 Vitamin D deficiency, unspecified: Secondary | ICD-10-CM | POA: Diagnosis not present

## 2021-05-03 DIAGNOSIS — M069 Rheumatoid arthritis, unspecified: Secondary | ICD-10-CM | POA: Diagnosis not present

## 2021-05-03 DIAGNOSIS — R7309 Other abnormal glucose: Secondary | ICD-10-CM | POA: Diagnosis not present

## 2021-05-03 DIAGNOSIS — E782 Mixed hyperlipidemia: Secondary | ICD-10-CM | POA: Diagnosis not present

## 2021-05-04 DIAGNOSIS — R300 Dysuria: Secondary | ICD-10-CM | POA: Diagnosis not present

## 2021-05-11 DIAGNOSIS — K51018 Ulcerative (chronic) pancolitis with other complication: Secondary | ICD-10-CM | POA: Diagnosis not present

## 2021-05-11 DIAGNOSIS — Z800 Family history of malignant neoplasm of digestive organs: Secondary | ICD-10-CM | POA: Diagnosis not present

## 2021-05-11 DIAGNOSIS — K219 Gastro-esophageal reflux disease without esophagitis: Secondary | ICD-10-CM | POA: Diagnosis not present

## 2021-05-12 DIAGNOSIS — E559 Vitamin D deficiency, unspecified: Secondary | ICD-10-CM | POA: Diagnosis not present

## 2021-05-12 DIAGNOSIS — E782 Mixed hyperlipidemia: Secondary | ICD-10-CM | POA: Diagnosis not present

## 2021-05-12 DIAGNOSIS — R7309 Other abnormal glucose: Secondary | ICD-10-CM | POA: Diagnosis not present

## 2021-05-12 DIAGNOSIS — G43909 Migraine, unspecified, not intractable, without status migrainosus: Secondary | ICD-10-CM | POA: Diagnosis not present

## 2021-05-12 DIAGNOSIS — Z0001 Encounter for general adult medical examination with abnormal findings: Secondary | ICD-10-CM | POA: Diagnosis not present

## 2021-05-12 DIAGNOSIS — M069 Rheumatoid arthritis, unspecified: Secondary | ICD-10-CM | POA: Diagnosis not present

## 2021-05-12 DIAGNOSIS — K5190 Ulcerative colitis, unspecified, without complications: Secondary | ICD-10-CM | POA: Diagnosis not present

## 2021-05-12 DIAGNOSIS — E890 Postprocedural hypothyroidism: Secondary | ICD-10-CM | POA: Diagnosis not present

## 2021-05-18 DIAGNOSIS — Z20822 Contact with and (suspected) exposure to covid-19: Secondary | ICD-10-CM | POA: Diagnosis not present

## 2021-05-20 DIAGNOSIS — M0579 Rheumatoid arthritis with rheumatoid factor of multiple sites without organ or systems involvement: Secondary | ICD-10-CM | POA: Diagnosis not present

## 2021-06-25 DIAGNOSIS — H60312 Diffuse otitis externa, left ear: Secondary | ICD-10-CM | POA: Diagnosis not present

## 2021-06-30 DIAGNOSIS — Z20822 Contact with and (suspected) exposure to covid-19: Secondary | ICD-10-CM | POA: Diagnosis not present

## 2021-07-07 ENCOUNTER — Other Ambulatory Visit: Payer: Self-pay | Admitting: Neurology

## 2021-07-15 DIAGNOSIS — M0579 Rheumatoid arthritis with rheumatoid factor of multiple sites without organ or systems involvement: Secondary | ICD-10-CM | POA: Diagnosis not present

## 2021-08-18 DIAGNOSIS — M2550 Pain in unspecified joint: Secondary | ICD-10-CM | POA: Diagnosis not present

## 2021-08-18 DIAGNOSIS — Z6830 Body mass index (BMI) 30.0-30.9, adult: Secondary | ICD-10-CM | POA: Diagnosis not present

## 2021-08-18 DIAGNOSIS — M0579 Rheumatoid arthritis with rheumatoid factor of multiple sites without organ or systems involvement: Secondary | ICD-10-CM | POA: Diagnosis not present

## 2021-08-18 DIAGNOSIS — M1991 Primary osteoarthritis, unspecified site: Secondary | ICD-10-CM | POA: Diagnosis not present

## 2021-08-18 DIAGNOSIS — E669 Obesity, unspecified: Secondary | ICD-10-CM | POA: Diagnosis not present

## 2021-08-21 DIAGNOSIS — U071 COVID-19: Secondary | ICD-10-CM | POA: Diagnosis not present

## 2021-09-02 DIAGNOSIS — Z20822 Contact with and (suspected) exposure to covid-19: Secondary | ICD-10-CM | POA: Diagnosis not present

## 2021-09-09 DIAGNOSIS — Z79899 Other long term (current) drug therapy: Secondary | ICD-10-CM | POA: Diagnosis not present

## 2021-09-09 DIAGNOSIS — M0579 Rheumatoid arthritis with rheumatoid factor of multiple sites without organ or systems involvement: Secondary | ICD-10-CM | POA: Diagnosis not present

## 2021-09-26 DIAGNOSIS — Z20822 Contact with and (suspected) exposure to covid-19: Secondary | ICD-10-CM | POA: Diagnosis not present

## 2021-10-07 DIAGNOSIS — H04123 Dry eye syndrome of bilateral lacrimal glands: Secondary | ICD-10-CM | POA: Diagnosis not present

## 2021-10-07 DIAGNOSIS — H16223 Keratoconjunctivitis sicca, not specified as Sjogren's, bilateral: Secondary | ICD-10-CM | POA: Diagnosis not present

## 2021-10-07 DIAGNOSIS — H25013 Cortical age-related cataract, bilateral: Secondary | ICD-10-CM | POA: Diagnosis not present

## 2021-10-07 DIAGNOSIS — H353131 Nonexudative age-related macular degeneration, bilateral, early dry stage: Secondary | ICD-10-CM | POA: Diagnosis not present

## 2021-10-20 DIAGNOSIS — R040 Epistaxis: Secondary | ICD-10-CM | POA: Diagnosis not present

## 2021-10-20 DIAGNOSIS — L739 Follicular disorder, unspecified: Secondary | ICD-10-CM | POA: Diagnosis not present

## 2021-10-20 DIAGNOSIS — H838X3 Other specified diseases of inner ear, bilateral: Secondary | ICD-10-CM | POA: Diagnosis not present

## 2021-10-20 DIAGNOSIS — B369 Superficial mycosis, unspecified: Secondary | ICD-10-CM | POA: Diagnosis not present

## 2021-10-20 DIAGNOSIS — H903 Sensorineural hearing loss, bilateral: Secondary | ICD-10-CM | POA: Diagnosis not present

## 2021-11-01 DIAGNOSIS — H04123 Dry eye syndrome of bilateral lacrimal glands: Secondary | ICD-10-CM | POA: Diagnosis not present

## 2021-11-01 DIAGNOSIS — E890 Postprocedural hypothyroidism: Secondary | ICD-10-CM | POA: Diagnosis not present

## 2021-11-01 DIAGNOSIS — K5190 Ulcerative colitis, unspecified, without complications: Secondary | ICD-10-CM | POA: Diagnosis not present

## 2021-11-01 DIAGNOSIS — R0600 Dyspnea, unspecified: Secondary | ICD-10-CM | POA: Diagnosis not present

## 2021-11-01 DIAGNOSIS — M069 Rheumatoid arthritis, unspecified: Secondary | ICD-10-CM | POA: Diagnosis not present

## 2021-11-02 ENCOUNTER — Other Ambulatory Visit: Payer: Self-pay | Admitting: Internal Medicine

## 2021-11-02 ENCOUNTER — Ambulatory Visit
Admission: RE | Admit: 2021-11-02 | Discharge: 2021-11-02 | Disposition: A | Discharge status: Home / Self Care | Payer: Medicare Other | Source: Ambulatory Visit | Attending: Internal Medicine | Admitting: Internal Medicine

## 2021-11-02 DIAGNOSIS — R059 Cough, unspecified: Secondary | ICD-10-CM | POA: Diagnosis not present

## 2021-11-02 DIAGNOSIS — R0602 Shortness of breath: Secondary | ICD-10-CM | POA: Diagnosis not present

## 2021-11-04 DIAGNOSIS — M0579 Rheumatoid arthritis with rheumatoid factor of multiple sites without organ or systems involvement: Secondary | ICD-10-CM | POA: Diagnosis not present

## 2021-11-06 ENCOUNTER — Other Ambulatory Visit: Payer: Self-pay | Admitting: Neurology

## 2021-11-06 DIAGNOSIS — G43711 Chronic migraine without aura, intractable, with status migrainosus: Secondary | ICD-10-CM

## 2021-11-08 NOTE — Telephone Encounter (Signed)
Patient is up to date on her appointments. Patient is due for a refill on ambien. [place] Controlled Substance Registry checked and is appropriate.

## 2021-11-16 ENCOUNTER — Other Ambulatory Visit: Payer: Self-pay | Admitting: Neurology

## 2021-12-02 ENCOUNTER — Encounter: Payer: Self-pay | Admitting: Internal Medicine

## 2021-12-02 ENCOUNTER — Ambulatory Visit (INDEPENDENT_AMBULATORY_CARE_PROVIDER_SITE_OTHER): Payer: Medicare Other | Admitting: Internal Medicine

## 2021-12-02 VITALS — BP 110/70 | HR 57 | Ht 66.0 in | Wt 183.3 lb

## 2021-12-02 DIAGNOSIS — G4733 Obstructive sleep apnea (adult) (pediatric): Secondary | ICD-10-CM | POA: Diagnosis not present

## 2021-12-02 DIAGNOSIS — J310 Chronic rhinitis: Secondary | ICD-10-CM

## 2021-12-02 DIAGNOSIS — R0602 Shortness of breath: Secondary | ICD-10-CM

## 2021-12-02 DIAGNOSIS — Z9989 Dependence on other enabling machines and devices: Secondary | ICD-10-CM

## 2021-12-02 MED ORDER — FLUTICASONE PROPIONATE 50 MCG/ACT NA SUSP: 1.0000 | Freq: Every day | NASAL | 2 refills | Status: DC

## 2021-12-02 NOTE — Progress Notes (Signed)
Terry Shannon    [id number]    Jan 14, 1956  Primary Care Physician:Shelton, Joelene Millin, MD  Referring Physician: Willey Blade, [place],   [id number] Reason for Consultation: shortness of breath Date of Consultation: 12/02/2021  Chief complaint:   Chief Complaint  Patient presents with   Consult    Pt consult SOB started <58mo also coughing up mucus with different hues.      HPI: Terry Shannon a 66y.o.a woman with OSA on CPAP who presents for new patient evaluation of shortness of breath. Here with six months of shortness of breath at both rest and exertion. Symptoms started all of a sudden.  No chest tightness, wheezing, heart palpitations.  Dr. SKarlton Lemonhad her try albuterol inhaler once and this gave her a migraine. She isn't sure if it helped her.   Also having cough with mucus production. Cough precedes shortness of breath. Cough is morning, night, usually after eating. Occasionally streaked with blood but she has also had epistaxis during this time. None recently. Cough does not wake her up from sleep but she has OSA on CPAP and takes [place]  OSA managed by Dr. DBrett Fairy   Additional history of rheumatoid arthritis and sees Dr. BAmil Amen She has primarily joint manifestations. On Simponi infusions. Gets migraines with ibuprofen and acetaminophen. She takes aspirin sometimes.   Had covid in early 2021. She has recurrent bronchitis as a child. Never diagnosed with asthma.   Has been allergic to dust mites.    She has chronic rhinitis which is improved on xyzal. 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   Exposures: lives at home with partner and partner's daughter and daughter's boyfriend Smoking history: passive smoke exposure in childhood, never smoker.   Social History    Occupational History   Not on file  Tobacco Use   Smoking status: Never   Smokeless tobacco: Never  Vaping Use   Vaping Use: Never used  Substance and Sexual Activity   Alcohol use: Yes    Comment: 2-3 drinks/week   Drug use: No   Sexual activity: Not Currently    Birth control/protection: None    Relevant family history:  Family History  Problem Relation Age of Onset   Stroke Mother    Asthma Mother    Cancer Father [phone]      lung   Diabetes Brother    Barrett's esophagus Brother    Cancer Maternal Grandmother [phone]      colon   Stroke Maternal Grandmother    Stroke Maternal Grandfather    Diabetes Paternal Grandmother     Past Medical History:  Diagnosis Date   Allergy    year-round, pt. states   Chronic lower back pain    CMC arthritis, thumb, degenerative 26/3785  right   Complication of anesthesia    slow to wake up after gallbladder surgery   Depression    Eczema    ARMS AND HANDS   GERD (gastroesophageal reflux disease)    History of thyroid cancer    Hypothyroidism    Migraines    Osteoarthritis    Overactive bladder    [place]    Sleep apnea    no CPAP use   Squamous cell carcinoma of skin 12/21/2017  in situ-mid chest (CX35FU)   Ulcerative colitis [facility])     Past Surgical History:  Procedure Laterality Date   ABDOMINAL HERNIA REPAIR  01/75/1025   periumbilical ventral hernia/incisional hernia   BUNIONECTOMY Right    x 2 more   BUNIONECTOMY Left    BUNIONECTOMY WITH CHILECTOMY Right 12/16/2005   CARPAL TUNNEL RELEASE Right 07/13/2001   CARPAL TUNNEL RELEASE Left 08/10/2001   DILATION AND CURETTAGE OF UTERUS     HAMMER TOE SURGERY Left may 2014   HYSTEROSCOPY WITH D & C  03/01/2004   with exc. of endometrial polyp   KNEE ARTHROSCOPY Right 2013   LAPAROSCOPIC CHOLECYSTECTOMY  11/03/2006   LUMBAR FUSION  03/06/2014   l4  l5    nerve ablation lumbar      THYROIDECTOMY, PARTIAL Right prior to 2002   THYROIDECTOMY,  PARTIAL Left 11/29/2000   and isthmus   TONSILLECTOMY  1961   TOTAL ANKLE REPLACEMENT Left 11/2016   with tendon repair   TOTAL KNEE ARTHROPLASTY Right 04/01/2013   Procedure: RIGHT TOTAL KNEE ARTHROPLASTY;  Surgeon: Alta Corning, MD;  Location: [place];  Service: Orthopedics;  Laterality: Right;     Physical Exam: Blood pressure 110/70, pulse (!) 57, height '5\' 6"'$  (1.676 m), weight 183 lb 4.8 oz (83.1 kg), SpO2 96 %. Gen:      No acute distress ENT:  no nasal polyps, mucus membranes moist Lungs:    No increased respiratory effort, symmetric chest wall excursion, clear to auscultation bilaterally, no wheezes or crackles CV:         Regular rate and rhythm; no murmurs, rubs, or gallops.  No pedal edema Abd:      + bowel sounds; soft, non-tender; no distension MSK: no acute synovitis of DIP or PIP joints, no mechanics hands.  Skin:      Warm and dry; no rashes Neuro: normal speech, no focal facial asymmetry Psych: alert and oriented x3, normal mood and affect   Data Reviewed/Medical Decision Making:  Independent interpretation of tests: Imaging:  Review of patient's chest xray images June 2023 revealed no acute process. The patient's images have been independently reviewed by me.    PFTs:      No data to display          Labs:  Lab Results  Component Value Date   WBC 8.3 01/15/2018   HGB 13.9 01/15/2018   HCT 41.5 01/15/2018   MCV 91.6 01/15/2018   PLT 283 01/15/2018   Lab Results  Component Value Date   NA 142 07/01/2019   K 4.4 07/01/2019   CL 106 07/01/2019   CO2 22 07/01/2019      Immunization status:  Immunization History  Administered Date(s) Administered   Influenza,inj,Quad PF,6+ Mos 05/05/2014, 03/18/2015, 03/25/2016   Influenza-Unspecified 01/21/2013, 04/16/2017     I reviewed prior external note(s) from PCP  I reviewed the result(s) of the labs and imaging as noted above.   I have ordered blood work, PFTs  Assessment:  Shortness of  breath Chronic cough - likely multifactorial from GERD and chronic rhinitis Rheumatoid Arthritis GERD  Plan/Recommendations: Will obtain a full set of PFTs.  Continue PPI. Will obtain region2  allergy panel for rhinitis Continue xyzal. Add flonase At risk for ILD from RA but no clinical signs or symptoms of this.  GERD precautions discussed.   We discussed disease management and progression at length today.   Return to Care: Return in about 6 weeks (around 01/13/2022).  Lenice Llamas, MD [facility] [place]  CC: Willey Blade, MD

## 2021-12-02 NOTE — Patient Instructions (Addendum)
Please schedule follow up scheduled with myself in 6 weeks.  If my schedule is not open yet, we will contact you with a reminder closer to that time. Please call [phone] if you haven't heard from [place] a month before.   Before your next visit I would like you to have:  Full set of PFTs - follow up with me after.  Blood work today  for allergy testing. Continue xyzal  Flonase/fluticasone - 1 spray on each side of your nose twice a day for first week, then 1 spray on each side.   Instructions for use: If you also use a saline nasal spray or rinse, use that first. Position the head with the chin slightly tucked. Use the right hand to spray into the left nostril and the right hand to spray into the left nostril.   Point the bottle away from the septum of your nose (cartilage that divides the two sides of your nose).  Hold the nostril closed on the opposite side from where you will spray Spray once and gently sniff to pull the medicine into the higher parts of your nose.  Don't sniff too hard as the medicine will drain down the back of your throat instead. Repeat with a second spray on the same side if prescribed. Repeat on the other side of your nose.    What is GERD? Gastroesophageal reflux disease (GERD) is gastroesophageal reflux diseasewhich occurs when the lower esophageal sphincter (LES) opens spontaneously, for varying periods of time, or does not close properly and stomach contents rise up into the esophagus. GER is also called acid reflux or acid regurgitation, because digestive juices--called acids--rise up with the food. The esophagus is the tube that carries food from the mouth to the stomach. The LES is a ring of muscle at the bottom of the esophagus that acts like a valve between the esophagus and stomach.  When acid reflux occurs, food or fluid can be tasted in the back of the mouth. When refluxed stomach acid touches the lining of the esophagus it may cause a burning sensation  in the chest or throat called heartburn or acid indigestion. Occasional reflux is common. Persistent reflux that occurs more than twice a week is considered GERD, and it can eventually lead to more serious health problems. People of all ages can have GERD. Studies have shown that GERD may worsen or contribute to asthma, chronic cough, and pulmonary fibrosis.   What are the symptoms of GERD? The main symptom of GERD in adults is frequent heartburn, also called acid indigestion--burning-type pain in the lower part of the mid-chest, behind the breast bone, and in the mid-abdomen.  Not all reflux is acidic in nature, and many patients don't have heart burn at all. Sometimes it feels like a cough (either dry or with mucus), choking sensation, asthma, shortness of breath, waking up at night, frequent throat clearing, or trouble swallowing.    What causes GERD? The reason some people develop GERD is still unclear. However, research shows that in people with GERD, the LES relaxes while the rest of the esophagus is working. Anatomical abnormalities such as a hiatal hernia may also contribute to GERD. A hiatal hernia occurs when the upper part of the stomach and the LES move above the diaphragm, the muscle wall that separates the stomach from the chest. Normally, the diaphragm helps the LES keep acid from rising up into the esophagus. When a hiatal hernia is present, acid reflux can occur more  easily. A hiatal hernia can occur in people of any age and is most often a normal finding in otherwise healthy people over age 73. Most of the time, a hiatal hernia produces no symptoms.   Other factors that may contribute to GERD include - Obesity or recent weight gain - Pregnancy  - Smoking  - Diet - Certain medications  Common foods that can worsen reflux symptoms include: - carbonated beverages - artificial sweeteners - citrus fruits  - chocolate  - drinks with caffeine or alcohol  - fatty and fried foods  -  garlic and onions  - mint flavorings  - spicy foods  - tomato-based foods, like spaghetti sauce, salsa, chili, and pizza   Lifestyle Changes If you smoke, stop.  Avoid foods and beverages that worsen symptoms (see above.) Lose weight if needed.  Eat small, frequent meals.  Wear loose-fitting clothes.  Avoid lying down for 3 hours after a meal.  Raise the head of your bed 6 to 8 inches by securing wood blocks under the bedposts. Just using extra pillows will not help, but using a wedge-shaped pillow may be helpful.  Medications  H2 blockers, such as cimetidine (Tagamet HB), famotidine (Pepcid AC), nizatidine (Axid AR), and ranitidine (Zantac 75), decrease acid production. They are available in prescription strength and over-the-counter strength. These drugs provide short-term relief and are effective for about half of those who have GERD symptoms.  Proton pump inhibitors include omeprazole (Prilosec, Zegerid), lansoprazole (Prevacid), pantoprazole (Protonix), rabeprazole (Aciphex), and esomeprazole (Nexium), which are available by prescription. Prilosec is also available in over-the-counter strength. Proton pump inhibitors are more effective than H2 blockers and can relieve symptoms and heal the esophageal lining in almost everyone who has GERD.  Because drugs work in different ways, combinations of medications may help control symptoms. People who get heartburn after eating may take both antacids and H2 blockers. The antacids work first to neutralize the acid in the stomach, and then the H2 blockers act on acid production. By the time the antacid stops working, the H2 blocker will have stopped acid production. Your health care provider is the best source of information about how to use medications for GERD.   Points to Remember 1. You can have GERD without having heartburn. Your symptoms could include a dry cough, asthma symptoms, or trouble swallowing.  2. Taking medications daily as  prescribed is important in controlling you symptoms.  Sometimes it can take up to 8 weeks to fully achieve the effects of the medications prescribed.  3. Coughing related to GERD can be difficult to treat and is very frustrating!  However, it is important to stick with these medications and lifestyle modifications before pursuing more aggressive or invasive test and treatments.

## 2021-12-04 LAB — RESPIRATORY ALLERGY PROFILE REGION II ~~LOC~~

## 2021-12-04 LAB — INTERPRETATION:

## 2021-12-09 DIAGNOSIS — M19172 Post-traumatic osteoarthritis, left ankle and foot: Secondary | ICD-10-CM | POA: Diagnosis not present

## 2021-12-09 DIAGNOSIS — M19171 Post-traumatic osteoarthritis, right ankle and foot: Secondary | ICD-10-CM | POA: Diagnosis not present

## 2021-12-09 DIAGNOSIS — M19071 Primary osteoarthritis, right ankle and foot: Secondary | ICD-10-CM | POA: Diagnosis not present

## 2021-12-09 DIAGNOSIS — M069 Rheumatoid arthritis, unspecified: Secondary | ICD-10-CM | POA: Diagnosis not present

## 2021-12-09 DIAGNOSIS — Z96662 Presence of left artificial ankle joint: Secondary | ICD-10-CM | POA: Diagnosis not present

## 2021-12-09 DIAGNOSIS — M79674 Pain in right toe(s): Secondary | ICD-10-CM | POA: Diagnosis not present

## 2021-12-15 ENCOUNTER — Encounter: Payer: Self-pay | Admitting: Neurology

## 2021-12-15 ENCOUNTER — Telehealth: Payer: Self-pay | Admitting: *Deleted

## 2021-12-15 ENCOUNTER — Ambulatory Visit (INDEPENDENT_AMBULATORY_CARE_PROVIDER_SITE_OTHER): Payer: Medicare Other | Admitting: Neurology

## 2021-12-15 VITALS — BP 126/77 | HR 63 | Ht 66.0 in | Wt 183.6 lb

## 2021-12-15 DIAGNOSIS — M5417 Radiculopathy, lumbosacral region: Secondary | ICD-10-CM | POA: Diagnosis not present

## 2021-12-15 DIAGNOSIS — G43711 Chronic migraine without aura, intractable, with status migrainosus: Secondary | ICD-10-CM | POA: Diagnosis not present

## 2021-12-15 DIAGNOSIS — Z9989 Dependence on other enabling machines and devices: Secondary | ICD-10-CM

## 2021-12-15 DIAGNOSIS — M05739 Rheumatoid arthritis with rheumatoid factor of unspecified wrist without organ or systems involvement: Secondary | ICD-10-CM | POA: Diagnosis not present

## 2021-12-15 DIAGNOSIS — G4733 Obstructive sleep apnea (adult) (pediatric): Secondary | ICD-10-CM

## 2021-12-15 DIAGNOSIS — G4484 Primary exertional headache: Secondary | ICD-10-CM | POA: Diagnosis not present

## 2021-12-15 MED ORDER — PROPRANOLOL HCL 10 MG PO TABS
ORAL_TABLET | ORAL | 1 refills | Status: DC
Start: 2021-12-15 — End: 2022-08-22

## 2021-12-15 MED ORDER — SUMATRIPTAN SUCCINATE 100 MG PO TABS: ORAL_TABLET | ORAL | 11 refills | Status: DC

## 2021-12-15 MED ORDER — ZONISAMIDE 100 MG PO CAPS: 100.0000 mg | ORAL_CAPSULE | Freq: Every day | ORAL | 3 refills | Status: DC

## 2021-12-15 MED ORDER — PROPRANOLOL HCL ER 80 MG PO CP24: 80.0000 mg | ORAL_CAPSULE | Freq: Every day | ORAL | 4 refills | Status: DC

## 2021-12-15 NOTE — Telephone Encounter (Signed)
Order form completed and signed by Dr Jaynee Eagles for Vyepti 100 mg every 3 months via IV. Order, office note, insurance info given to [employer] with [employer] for processing.

## 2021-12-15 NOTE — Patient Instructions (Signed)
Migraines: Try Vyepti '100mg'$ . After the first infusion if no relief and no side effects let us know we will increase to '300mg'$ .  Exertional headaches: propranolol '10mg'$  30-60 mins prior  Xanax: prior to ESI/cervical lumbar injections  Fatigue and prior diagnosis of OSA:  Moderately Severe Obstructive Sleep Apnea (OSA) with an AHI of 28.9/h and with strong REM and supine position dependency.REM AHI was 69.5 /hour, supine AHI was 58.1/h. Continue cpap.   Chronic neck and low back pains:  Try [email or web address] [place]  Eptinezumab Injection(VYEPTI) What is this medication? EPTINEZUMAB (EP ti NEZ ue mab) prevents migraines. It works by blocking a substance in the body that causes migraines. It is a monoclonal antibody. This medicine may be used for other purposes; ask your health care provider or pharmacist if you have questions. COMMON BRAND NAME(S): Vyepti What should I tell my care team before I take this medication? They need to know if you have any of these conditions: An unusual or allergic reaction to eptinezumab, other medications, foods, dyes, or preservatives Pregnant or trying to get pregnant Breast-feeding How should I use this medication? This medication is injected into a vein. It is given by your care team in a hospital or clinic setting. Talk to your care team about the use of this medication in children. Special care may be needed. Overdosage: If you think you have taken too much of this medicine contact a poison control center or emergency room at once. NOTE: This medicine is only for you. Do not share this medicine with others. What if I miss a dose? Keep appointments for follow-up doses. It is important not to miss your dose. Call your care team if you are unable to keep an appointment. What may interact with this medication? Interactions are not expected. This list may not describe all possible interactions. Give your health care provider a list of all the medicines, herbs,  non-prescription drugs, or dietary supplements you use. Also tell them if you smoke, drink alcohol, or use illegal drugs. Some items may interact with your medicine. What should I watch for while using this medication? Your condition will be monitored carefully while you are receiving this medication. What side effects may I notice from receiving this medication? Side effects that you should report to your care team as soon as possible: Allergic reactions or angioedema--skin rash, itching or hives, swelling of the face, eyes, lips, tongue, arms, or legs, trouble swallowing or breathing Side effects that usually do not require medical attention (report to your care team if they continue or are bothersome): Runny or stuffy nose This list may not describe all possible side effects. Call your doctor for medical advice about side effects. You may report side effects to FDA at 1-800-FDA-1088. Where should I keep my medication? This medication is given in a hospital or clinic. It will not be stored at home. NOTE: This sheet is a summary. It may not cover all possible information. If you have questions about this medicine, talk to your doctor, pharmacist, or health care provider.  2023 Elsevier/Gold Standard (2021-07-05 00:00:00)

## 2021-12-15 NOTE — Progress Notes (Signed)
[facility]    Provider:  Dr Jaynee Eagles Referring Provider: Willey Blade, MD Primary Care Physician:  Willey Blade, MD    CC:  Migraine  Internal history 12/15/2021: here for follow up. Hard to use the cpap but she can only sleep with it for more than 4 hours a night but she is trying hard. The propranolol before exercising helps. Botox didn't seem to help and she still has migraines, at least 10 migraine days a month and at least 15 headache days a month. Had a discussion on options, will try Vyepti.  She is tried and failed multiple medications including:Topamax, amitriptyline, gabapentin, Flexeril, propranolol, Pristiq, Reglan, tylenol, nsaids, asa, and she has tried a lot that she can't remember and was on these for years, stopped working.Zonegran, botox, ajovy, emgality, aimovig contraindicated due to constipation, gabapentin, flexeril ,zonisamide, gabapentin.  Patient complains of symptoms per HPI as well as the following symptoms: migraines . Pertinent negatives and positives per HPI. All others negative  Interval history 12/15/2020: This is a 66 year old female with chronic migraines, she has failed multiple medications, she also has exercise-induced headaches, she is on propranolol, zonisamide, we sent  her dry needling, she is not using her CPAP and I did discuss that with her - see below.  She takes Imitrex and Reglan at onset of headache.  She is tried and failed multiple medications:Topamax, amitriptyline, gabapentin, Flexeril, propranolol, Pristiq, Reglan, tylenol, nsaids, asa, and she has tried a lot that she can't remember and was on these for years, stopped working.Zonegran .  She was doing well on propranolol and zonogram and we started Botox. And she stopped that Baseline is daily migraines and headaches.We also tried emgality but she stopped that. Dry needling helped a lot. Still on propranolol, gabapentin, flexeril and stopped the zonisamide and the  emgality but migraines seem to be under control.     Migraines, had several botox treatments, botox did not really help but she feels her migraines are overll better a few a month and taking acute management sumatriptan.medication but of course hers was more exertional and she hasn't been exerting herself due to neck and back pain.    Fatigue and prior diagnosis of OSA: Was referred back to sleep studies Dr. Brett Fairy and she was seen and evaluated 03/24/2020 and restested Moderately Severe Obstructive Sleep Apnea (OSA) with an AHI of 28.9/h and with strong REM and supine position dependency.REM AHI was 69.5 /hour, supine AHI was 58.1/h. Working on getting a cpap or inspire.           Chronic neck and low back pains:  , she has chronic neck pain and we sent her to physical therapy and dry needling, she has had it for many years, last MRI in 01/2020 (personally reviewed images) showed multilevel spondylosis and degenerative changes, she has tried other conservative measures,  and pursued interventional treatment such as epidural steroid injections, medial branch blocks, or other possibly surgical.Dry needling helped and the exercises helped. Repeated MRI cervical spine and then send to Dr. Mina Marble for interventional procedures for her severe neck pain  and is feeling better. She also has low back pain and is having PT and getting injections there.   MRI cervical spine  01/2020:  IMPRESSION:   MRI cervical spine (without) demonstrating: - Degenerative spondylosis and disc bulging from C4-5 to T2-3. No spinal stenosis or foraminal narrowing. - Mild chronic small vessel ischemic disease in the pons.   Interval history 07/01/19: 66 year old with a past  medical history sleep apnea, rheumatoid arthritis, osteoarthritis, chronic migraines for over 2 decades (when I initially saw her quality, frequency, severity and triggers had not significantly changed in many years), hypothyroidism, depression, chronic low back  pain. this is a patient I been seeing for several years, chronic migraines and exertional headaches, she has been doing extremely well on propranolol and zonisamide, her blood pressure has been a little elevated, I last visit we increase propranolol to help with headaches as well as blood pressure.  She is also on zonisamide for her migraines.  Reglan and Imitrex as tolerated for acute management.  Today she is not doing as well. She reports her neck is worsening, she can walk her dog around the block and she has a migraine, any movement with her upper body can cause a migraine, she had a nerve ablation in the low back last year and she is going to see Dr. Jacelyn Grip.  She is having daily migraines and headaches, migraines can be moderately severe to severe, they can last upwards of 24 hours, no aura, no medication overuse, ongoing for the last year at the severity and frequency, worsening exertional quality, she can get a headache even folding close.  This is concerning we need imaging.Will start botox.  Tried: Topamax, amitriptyline, gabapentin, Flexeril, propranolol, Pristiq, Reglan, tylenol, nsaids, asa, and she has tried a lot that she can't remember and was on these for years, stopped working.Taking Zonegran now.  Interval history 10/11/2018: Patient continues to do well.  She still has exertional headaches when she walks and her blood pressure runs "a little on the high side".  We discussed increasing the propranolol to 80 mg as this is a very good headache/migraine medication, and can help with her slightly elevated high blood pressure.  We also discussed she takes the benzodiazepine very rarely, before procedures, sometimes for anxiety, advised her to continue to take it very sparingly and situationally, discussed addiction and overuse and risks of respiratory depression.  Also discussed side effects of propranolol.  We will continue her zonisamide for her migraines.  We will continue Reglan and Imitrex as  tolerated for acute management. The headaches start in the back of her neck, it gets tight then spreads to the left side of her head. It is poinding, throbbing, pain behind the eye temple pounding, +light sensitivity, +sound sensitivity, + nausea, no vomiting in over 10 years. Another trigger is exercise. When she exercise she will get a migraine that day.  10/04/2017: She is doing exceptionally well. She tolerates the medication. She is very thankful. Discussed her current regimen, the new medications anti-CGRP meds should she every need them, also botox for migraine and other preventative and acute management but she very happy as is.  Discussed that she now gets headaches and moigraines after her ESI or LBP, unclear if it is the anxiety of the shot, discussed options will give xanax prn   10/04/2016: Patient is here for chronic migraines. She is improved with regard to the migraines. She still has exertional headaches. Her blood pressure is slightly elevated. Discussed increasing propranolol for exertional headaches and may also help with hypertension she should be monitoring her blood pressure daily. Discussed exertional headaches. Discussed hypertension and following up with her primary care for management. She is doing well on Zonisamide. We'll continue 100 mg in the evenings. Discussed headache triggers, environmental triggers, stress triggers and lifestyle.   HPI:  Terry Shannon is a 66 y.o. female here as a second opinion from  my colleague Dr. Brett Fairy  for migraines. She has had the migraines since the age of 26. Certain foods and drinks are triggers and she has taken those out of her diet, like wine and aspertame. The headaches start in the back of her neck, it gets tight then spreads to the left side of her head. It is poinding, throbbing, pain behind the eye temple pounding, +light sensitivity, +sound sensitivity, + nausea, no vomiting in over 10 years. Another trigger is exercise. When she exercise  she will get a migraine that day. No medicaton overuse headache. Has been going on for years at this frequency. She has 16 migraines a month, they can last for 1-3 days, on average they are 8/10 in severity. They started years ago after a myelogram. No FHx of migraines. She was recently started on Zonegran by Dr. Roddie Mc which she feels may be helping. Reports that she has had normal brain imaging in the past.   Tried: Topamax, amitriptyline, propranolol and she has tried a lot that she can't remember and was on these for years, stopped working.Taking Zonegran now. She uses flexeril and an aspirin now on onset which seems to help a little. Imitrex used to help but stopped helping. She tried zomig and mazalt and relpax.  Has contraindication to NSAIDs but still takes aspirin as this is the only thing that appears to help acutely.    Reviewed notes, labs and imaging from outside physicians, which showed: Personally reviewed images and agree with the following   Vertebral Arteries: BILATERAL patent, LEFT dominant.   Paraspinal tissues: Paravertebral anterior soft tissues are obscured by saturation bands. No visible adenopathy.   Disc levels:   The individual disc spaces were examined as follows:   C2-3: Normal disc space. Trace anterolisthesis. Severe left-sided facet arthropathy. No foraminal narrowing.   C3-4: Moderately severe disc space narrowing. Fusion across the facet joints. No impingement.   C4-5: Central disc osteophyte complex. Trace anterolisthesis. No impingement.   C5-6: Moderate disc space narrowing. Asymmetric foraminal narrowing on the RIGHT due to uncinate spurring. RIGHT C6 nerve root impingement not excluded.   C6-7: Central disc osteophyte complex. No stenosis or foraminal narrowing.   C7-T1: 3 mm anterolisthesis is facet mediated. Advanced facet arthropathy without impingement.   IMPRESSION: Multilevel spondylosis as described. No significant left-sided foraminal  narrowing or leftward disc protrusion.   Asymmetric facet arthropathy on the LEFT is most notable at C2-3. Correlate clinically for facet mediated upper extremity symptoms.   Review of Systems: Patient complains of symptoms per HPI as well as the following symptoms: neck and back pain . Pertinent negatives and positives per HPI. All others negative    Social History   Socioeconomic History   Marital status: Significant Other    Spouse name: Not on file   Number of children: 0   Years of education: Not on file   Highest education level: Bachelor's degree (e.g., BA, AB, BS)  Occupational History   Not on file  Tobacco Use   Smoking status: Never   Smokeless tobacco: Never  Vaping Use   Vaping Use: Never used  Substance and Sexual Activity   Alcohol use: Yes    Comment: 2-3 drinks/week   Drug use: No   Sexual activity: Not Currently    Birth control/protection: None  Other Topics Concern   Not on file  Social History Narrative   Lives at home with her partner and two daughters   Right handed   Caffeine; 1  cup/daily.     Social Determinants of Health   Financial Resource Strain: Not on file  Food Insecurity: Not on file  Transportation Needs: Not on file  Physical Activity: Not on file  Stress: Not on file  Social Connections: Not on file  Intimate Partner Violence: Not on file    Family History  Problem Relation Age of Onset   Stroke Mother    Asthma Mother    Cancer Father 26       lung   Diabetes Brother    Barrett's esophagus Brother    Cancer Maternal Grandmother 43       colon   Stroke Maternal Grandmother    Stroke Maternal Grandfather    Diabetes Paternal Grandmother     Past Medical History:  Diagnosis Date   Allergy    year-round, pt. states   Chronic lower back pain    CMC arthritis, thumb, degenerative 07/5327   right   Complication of anesthesia    slow to wake up after gallbladder surgery   Depression    Eczema    ARMS AND HANDS    GERD (gastroesophageal reflux disease)    History of thyroid cancer    Hypothyroidism    Migraines    Osteoarthritis    Overactive bladder    RA (rheumatoid arthritis) ([place])    Sleep apnea    no CPAP use   Squamous cell carcinoma of skin 12/21/2017   in situ-mid chest (CX35FU)   Ulcerative colitis ([place])     Past Surgical History:  Procedure Laterality Date   ABDOMINAL HERNIA REPAIR  92/42/6834   periumbilical ventral hernia/incisional hernia   BUNIONECTOMY Right    x 2 more   BUNIONECTOMY Left    BUNIONECTOMY WITH CHILECTOMY Right 12/16/2005   CARPAL TUNNEL RELEASE Right 07/13/2001   CARPAL TUNNEL RELEASE Left 08/10/2001   DILATION AND CURETTAGE OF UTERUS     HAMMER TOE SURGERY Left may 2014   HYSTEROSCOPY WITH D & C  03/01/2004   with exc. of endometrial polyp   KNEE ARTHROSCOPY Right 2013   LAPAROSCOPIC CHOLECYSTECTOMY  11/03/2006   LUMBAR FUSION  03/06/2014   l4  l5    nerve ablation lumbar      THYROIDECTOMY, PARTIAL Right prior to 2002   THYROIDECTOMY, PARTIAL Left 11/29/2000   and isthmus   TONSILLECTOMY  1961   TOTAL ANKLE REPLACEMENT Left 11/2016   with tendon repair   TOTAL KNEE ARTHROPLASTY Right 04/01/2013   Procedure: RIGHT TOTAL KNEE ARTHROPLASTY;  Surgeon: Alta Corning, MD;  Location: [facility];  Service: Orthopedics;  Laterality: Right;    Current Outpatient Medications  Medication Sig Dispense Refill   ALPRAZolam (XANAX) 0.5 MG tablet Take only before procedures (injections into the neck and low back) up to 3x a day 30 tablet 0   Ascorbic Acid (VITAMIN C PO) Take 6,000 mg by mouth daily.     aspirin EC 325 MG tablet Take 325 mg by mouth 2 (two) times daily as needed for mild pain.      B Complex-C (SUPER B COMPLEX PO) Take 1 tablet by mouth daily.     Cholecalciferol (VITAMIN D3 PO) Take 1 tablet by mouth daily.      cyclobenzaprine (FLEXERIL) 10 MG tablet Take 10 mg by mouth 3 (three) times daily as needed for muscle spasms.     desvenlafaxine (PRISTIQ) 100  MG 24 hr tablet Take 100 mg by mouth daily.  Flax OIL Take by mouth daily.     fluticasone (FLONASE) 50 MCG/ACT nasal spray Place 1 spray into both nostrils daily. 16 g 2   gabapentin (NEURONTIN) 300 MG capsule Take 300 mg by mouth 4 (four) times daily.      golimumab (SIMPONI ARIA) 50 MG/4ML SOLN injection See admin instructions.     levothyroxine (SYNTHROID) 50 MCG tablet Take 50 mcg by mouth daily before breakfast.     mesalamine (LIALDA) 1.2 G EC tablet Take 2.4 g by mouth 2 (two) times daily.      oxybutynin (DITROPAN-XL) 10 MG 24 hr tablet Take 10 mg by mouth daily.     pantoprazole (PROTONIX) 40 MG tablet Take 40 mg by mouth daily.     Probiotic Product (PROBIOTIC DAILY PO) Take 1 tablet by mouth daily.      RESTASIS MULTIDOSE 0.05 % ophthalmic emulsion Place 1 drop into both eyes 2 (two) times daily.     Rosuvastatin Calcium (CRESTOR PO) Take 20 mg by mouth daily.      thyroid (ARMOUR) 60 MG tablet Take 60 mg by mouth daily before breakfast.     zolpidem (AMBIEN CR) 12.5 MG CR tablet TAKE 1 TABLET (12.5 MG TOTAL) BY MOUTH AT BEDTIME. 30 tablet 5   propranolol (INDERAL) 10 MG tablet Take 30-60 minutes prior to exercise for exercise-induced headaches. Max twice daily 90 tablet 1   propranolol ER (INDERAL LA) 80 MG 24 hr capsule Take 1 capsule (80 mg total) by mouth at bedtime. For migraine prevention and high blood pressure. 90 capsule 4   SUMAtriptan (IMITREX) 100 MG tablet TAKE 1 TABLET BY MOUTH ONCE DAILY AS NEEDED. MAY REPEAT IN 2 HOURS IF HEADACHE PERSISTS OR RECURS. 10 tablet 11   zonisamide (ZONEGRAN) 100 MG capsule Take 1 capsule (100 mg total) by mouth daily. For migraine prevention. 90 capsule 3   No current facility-administered medications for this visit.    Allergies as of 12/15/2021 - Review Complete 12/15/2021  Allergen Reaction Noted   Codeine Nausea And Vomiting 09/09/2010   Hydrocodone Nausea And Vomiting 08/08/2012   Ibuprofen Other (See Comments) 08/08/2012    Iodinated contrast media Hives and Itching 09/16/2010   Methotrexate derivatives Other (See Comments) 08/08/2012   Oxycodone Nausea And Vomiting 04/01/2013   Tylenol [acetaminophen] Other (See Comments) 08/08/2012   Aleve [naproxen sodium]     Sulfa antibiotics  11/01/2011   Neosporin [neomycin-bacitracin zn-polymyx] Swelling 07/18/2013    Vitals: BP 126/77   Pulse 63   Ht '5\' 6"'$  (1.676 m)   Wt 183 lb 9.6 oz (83.3 kg)   BMI 29.63 kg/m  Last Weight:  Wt Readings from Last 1 Encounters:  12/15/21 183 lb 9.6 oz (83.3 kg)   Last Height:   Ht Readings from Last 1 Encounters:  12/15/21 '5\' 6"'$  (1.676 m)    Exam: NAD, pleasant                  Speech:    Speech is normal; fluent and spontaneous with normal comprehension.  Cognition:    The patient is oriented to person, place, and time;     recent and remote memory intact;     language fluent;    Cranial Nerves:    The pupils are equal, round, and reactive to light.Trigeminal sensation is intact and the muscles of mastication are normal. The face is symmetric. The palate elevates in the midline. Hearing intact. Voice is normal. Shoulder shrug is normal. The tongue  has normal motion without fasciculations.   Coordination:  No dysmetria  Motor Observation:    No asymmetry, no atrophy, and no involuntary movements noted. Tone:    Normal muscle tone.     Strength:    Strength is V/V in the upper and lower limbs.      Sensation: intact to LT        Assessment/Plan:  This is a 66 year old female with chronic migraines, she has failed multiple medications, she also has exercise-induced headaches, she is on propranolol, zonisamide, we sent  her dry needling,  She takes Imitrex and Reglan at onset of headache.  Botox didn't seem to help and she still has migraines, see full list below, all the cgrp injections as well,  at least 10 moderate to severe migraine days a month and at least 15 headache days a month for years. Had a  discussion on options, will try Vyepti.  Migraines: Try Vyepti '100mg'$ . Call after 30 days post and can increase to 300 if needed.  She is tried and failed multiple medications including:Topamax, amitriptyline, gabapentin, Flexeril, propranolol, Pristiq, Reglan, tylenol, nsaids, asa, Zonegran, botox, ajovy, emgality, aimovig contraindicated due to constipation, gabapentin, flexeril ,zonisamide, gabapentin.depakote. imitrex. Maxalt.   Exertional headaches: propranolol '10mg'$  30-60 mins prior  Xanax: ONLY prior to ESI/cervical lumbar injections  Fatigue and prior diagnosis of OSA: Was referred back to sleep studies Dr. Brett Fairy and she was seen and evaluated 03/24/2020 and restested Moderately Severe Obstructive Sleep Apnea (OSA) with an AHI of 28.9/h and with strong REM and supine position dependency.REM AHI was 69.5 /hour, supine AHI was 58.1/h. Hard to use the cpap but she can only sleep with it for more than 4 hours a night but she is trying hard. The propranolol before exercising helps.    Chronic neck and low back pains:  , she has chronic neck pain and we sent her to physical therapy and dry needling, she has had it for many years, PT and getting injections. Try [email or web address]   No orders of the defined types were placed in this encounter.   Meds ordered this encounter  Medications   propranolol ER (INDERAL LA) 80 MG 24 hr capsule    Sig: Take 1 capsule (80 mg total) by mouth at bedtime. For migraine prevention and high blood pressure.    Dispense:  90 capsule    Refill:  4   zonisamide (ZONEGRAN) 100 MG capsule    Sig: Take 1 capsule (100 mg total) by mouth daily. For migraine prevention.    Dispense:  90 capsule    Refill:  3   SUMAtriptan (IMITREX) 100 MG tablet    Sig: TAKE 1 TABLET BY MOUTH ONCE DAILY AS NEEDED. MAY REPEAT IN 2 HOURS IF HEADACHE PERSISTS OR RECURS.    Dispense:  10 tablet    Refill:  11   propranolol (INDERAL) 10 MG tablet    Sig: Take 30-60 minutes prior to  exercise for exercise-induced headaches. Max twice daily    Dispense:  90 tablet    Refill:  1      Discussed again: To prevent or relieve headaches, try the following: Cool Compress. Lie down and place a cool compress on your head.  Avoid headache triggers. If certain foods or odors seem to have triggered your migraines in the past, avoid them. A headache diary might help you identify triggers.  Include physical activity in your daily routine. Try a daily walk or other moderate aerobic exercise.  Manage stress. Find healthy ways to cope with the stressors, such as delegating tasks on your to-do list.  Practice relaxation techniques. Try deep breathing, yoga, massage and visualization.  Eat regularly. Eating regularly scheduled meals and maintaining a healthy diet might help prevent headaches. Also, drink plenty of fluids.  Follow a regular sleep schedule. Sleep deprivation might contribute to headaches Consider biofeedback. With this mind-body technique, you learn to control certain bodily functions -- such as muscle tension, heart rate and blood pressure -- to prevent headaches or reduce headache pain.    Proceed to emergency room if you experience new or worsening symptoms or symptoms do not resolve, if you have new neurologic symptoms or if headache is severe, or for any concerning symptom.   Provided education and documentation from American headache Society toolbox including articles on: chronic migraine medication overuse headache, chronic migraines, prevention of migraines, behavioral and other nonpharmacologic treatments for headache.    Sarina Ill, MD  [facility] [address] [place], [place] [address]  Phone [phone] Fax [phone]  I spent 30 minutes of face-to-face and non-face-to-face time with patient on the  1. Chronic migraine without aura, with intractable migraine, so stated, with status migrainosus   2. Intractable  chronic migraine without aura and with status migrainosus   3. Exertional headache   4. OSA (obstructive sleep apnea)   5. Lumbosacral radiculopathy   6. OSA on CPAP   7. 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  This included previsit chart review, lab review, study review, order entry, electronic health record documentation, patient education on the different diagnostic and therapeutic options, counseling and coordination of care, risks and benefits of management, compliance, or risk factor reduction

## 2021-12-30 DIAGNOSIS — M0579 Rheumatoid arthritis with rheumatoid factor of multiple sites without organ or systems involvement: Secondary | ICD-10-CM | POA: Diagnosis not present

## 2022-01-10 DIAGNOSIS — G43711 Chronic migraine without aura, intractable, with status migrainosus: Secondary | ICD-10-CM | POA: Diagnosis not present

## 2022-01-27 ENCOUNTER — Ambulatory Visit (INDEPENDENT_AMBULATORY_CARE_PROVIDER_SITE_OTHER): Payer: Medicare Other | Admitting: Nurse Practitioner

## 2022-01-27 ENCOUNTER — Ambulatory Visit: Payer: Medicare Other | Admitting: Internal Medicine

## 2022-01-27 ENCOUNTER — Encounter: Payer: Self-pay | Admitting: Nurse Practitioner

## 2022-01-27 ENCOUNTER — Ambulatory Visit (INDEPENDENT_AMBULATORY_CARE_PROVIDER_SITE_OTHER): Payer: Medicare Other | Admitting: Internal Medicine

## 2022-01-27 VITALS — BP 130/70 | HR 53 | Ht 67.0 in | Wt 179.4 lb

## 2022-01-27 DIAGNOSIS — J4530 Mild persistent asthma, uncomplicated: Secondary | ICD-10-CM | POA: Diagnosis not present

## 2022-01-27 DIAGNOSIS — R0602 Shortness of breath: Secondary | ICD-10-CM

## 2022-01-27 DIAGNOSIS — R0789 Other chest pain: Secondary | ICD-10-CM

## 2022-01-27 DIAGNOSIS — R058 Other specified cough: Secondary | ICD-10-CM | POA: Diagnosis not present

## 2022-01-27 DIAGNOSIS — K219 Gastro-esophageal reflux disease without esophagitis: Secondary | ICD-10-CM | POA: Diagnosis not present

## 2022-01-27 LAB — CBC WITH DIFFERENTIAL/PLATELET
Basophils Absolute: 0 10*3/uL (ref 0.0–0.1)
Basophils Relative: 0.5 % (ref 0.0–3.0)
Eosinophils Absolute: 0.2 10*3/uL (ref 0.0–0.7)
Eosinophils Relative: 2.2 % (ref 0.0–5.0)
HCT: 40.2 % (ref 36.0–46.0)
Hemoglobin: 13.2 g/dL (ref 12.0–15.0)
Lymphocytes Relative: 28 % (ref 12.0–46.0)
Lymphs Abs: 2.1 10*3/uL (ref 0.7–4.0)
MCHC: 33 g/dL (ref 30.0–36.0)
MCV: 90.8 fl (ref 78.0–100.0)
Monocytes Absolute: 0.8 10*3/uL (ref 0.1–1.0)
Monocytes Relative: 10.6 % (ref 3.0–12.0)
Neutro Abs: 4.5 10*3/uL (ref 1.4–7.7)
Neutrophils Relative %: 58.7 % (ref 43.0–77.0)
Platelets: 246 10*3/uL (ref 150.0–400.0)
RBC: 4.43 Mil/uL (ref 3.87–5.11)
RDW: 14.3 % (ref 11.5–15.5)
WBC: 7.7 10*3/uL (ref 4.0–10.5)

## 2022-01-27 LAB — PULMONARY FUNCTION TEST
DL/VA % pred: 110 %
DL/VA: 4.49 ml/min/mmHg/L
DLCO cor % pred: 100 %
DLCO cor: 21.75 ml/min/mmHg
DLCO unc % pred: 100 %
DLCO unc: 21.75 ml/min/mmHg
FEF 25-75 Post: 1.91 L/sec
FEF 25-75 Pre: 2.95 L/sec
FEF2575-%Change-Post: -35 %
FEF2575-%Pred-Post: 88 %
FEF2575-%Pred-Pre: 136 %
FEV1-%Change-Post: -8 %
FEV1-%Pred-Post: 89 %
FEV1-%Pred-Pre: 97 %
FEV1-Post: 2.34 L
FEV1-Pre: 2.55 L
FEV1FVC-%Change-Post: 0 %
FEV1FVC-%Pred-Pre: 108 %
FEV6-%Change-Post: -8 %
FEV6-%Pred-Post: 85 %
FEV6-%Pred-Pre: 93 %
FEV6-Post: 2.82 L
FEV6-Pre: 3.07 L
FEV6FVC-%Pred-Post: 104 %
FEV6FVC-%Pred-Pre: 104 %
FVC-%Change-Post: -8 %
FVC-%Pred-Post: 81 %
FVC-%Pred-Pre: 89 %
FVC-Post: 2.82 L
FVC-Pre: 3.07 L
Post FEV1/FVC ratio: 83 %
Post FEV6/FVC ratio: 100 %
Pre FEV1/FVC ratio: 83 %
Pre FEV6/FVC Ratio: 100 %
RV % pred: 94 %
RV: 2.17 L
TLC % pred: 104 %
TLC: 5.73 L

## 2022-01-27 LAB — POCT EXHALED NITRIC OXIDE: FeNO level (ppb): 30

## 2022-01-27 MED ORDER — AZELASTINE HCL 0.1 % NA SOLN: 2.0000 | Freq: Every day | NASAL | 2 refills | Status: DC

## 2022-01-27 MED ORDER — FLUTICASONE-SALMETEROL 45-21 MCG/ACT IN AERO: 2.0000 | INHALATION_SPRAY | Freq: Two times a day (BID) | RESPIRATORY_TRACT | 5 refills | Status: DC

## 2022-01-27 NOTE — Assessment & Plan Note (Signed)
I do suspect that her cough is multifactorial related to asthmatic bronchitis and upper airway irritation. Recommended to continue flonase for postnasal drainage control and Xyzal for trigger prevention. Can use astelin as well for nasal congestion/drainage 1-2 times daily. Chlortab at night for cough/allergies. Reviewed measures to limit further irritation and reduce inflammation.

## 2022-01-27 NOTE — Patient Instructions (Addendum)
Continue flonase nasal spray 2 sprays each nostril daily Continue Xyzal 1 tab daily for allergies  Continue protonix 40 mg daily  -Astelin 2 sprays each nostril daily for nasal congestion/runny nose -Trial Advair 2 puffs Twice daily. Brush tongue and rinse mouth afterwards -Pepcid (famotidine) 20 mg in the evenings for breakthrough reflux symptoms -Chlorpheniramine 4 mg over the counter tablet At bedtime for allergies/cough  Upper airway cough syndrome: Suppress your cough to allow your larynx (voice box) to heal.  Limit talking for the next few days. Avoid throat clearing. Work on cough suppression with the above recommended suppressants.  Use sugar free hard candies or non-menthol cough drops during this time to soothe your throat.  Warm tea with honey and lemon.   Follow up in 4 weeks with Dr. Shearon Stalls (1st) or Katie Jonerik Sliker,NP to see how Advair is working for you. If symptoms do not improve or worsen, please contact office for sooner follow up or seek emergency care.

## 2022-01-27 NOTE — Assessment & Plan Note (Signed)
See above plan. 

## 2022-01-27 NOTE — Patient Instructions (Signed)
Full PFT Performed Today  

## 2022-01-27 NOTE — Progress Notes (Signed)
$'@Patient'w$  ID: Terry Shannon, female    DOB: 11-Jul-1955, 66 y.o.   MRN: [id number]  Chief Complaint  Patient presents with   Follow-up    PFT    Referring provider: Willey Blade, MD  HPI: 66 year old female, never smoker followed for shortness of breath and cough.  She was initially referred in July 2023 and seen by Dr. Shearon Stalls for consult.  Past medical history significant for OSA on CPAP, migraines, GERD, hypothyroidism, RA, depression, anxiety, HLD.  TEST/EVENTS:  11/02/2021 CXR 2 view: both lungs clear 01/27/2022 PFT: FVC 89, FEV1 97, ratio 83, TLC 104, DLCO 100. No BD  12/02/2021: OV with Dr. Shearon Stalls for initial pulmonary consult. She had 6 months of shortness of breath both at rest and exertion with sudden onset.  Her PCP tried to treat her with albuterol inhaler but this gave her a migraine so she stopped it.  She also was not sure if it helped.  She has been having a cough with mucus production which usually occurs prior to the shortness of breath.  Cough is morning night and usually after eating.  It is occasionally streaked with blood but she has also had epistaxis during this time.  No hemoptysis recently.  She has an additional history of rheumatoid arthritis and sees Dr. Amil Amen.  She is on Simponi infusions.  Had Dale in early 2021.  She has had recurrent bronchitis as a child.  Never diagnosed with asthma.  Has been allergic to dust mites.  Chronic rhinitis which is improved on Xyzal.  She takes pantoprazole for reflux.  Suspected that her cough was likely multifactorial.  Ordered PFTs for further evaluation.  Allergen panel ordered as well.  Recommended adding on Flonase for rhinitis.  At risk for ILD from RA but no clinical signs or symptoms of this.  01/27/2022: Today - follow up Patient presents today for follow up after PFTs which were overall normal. She reports that her nasal congestion and cough have improved by probably 50%. She still has some sinus congestion with postnasal  drip but it is not nearly as bad as it was before she started the flonase. She occasionally produces sputum when she coughs, which changes colors. She has noticed some blood tinged sputum the day following episode of epistaxis, which does not happen often. She doesn't have any significant chest congestion or wheezing. She does clear her throat often. She still feels like her breathing is not what it used to be. She gets winded with longer distances or climbing, or if it's hot outside. She did try albuterol once before but thought it gave her a migraine; however, today she's not so sure because the nebulizer treatment from the PFTs did not cause a headache. She did feel like it helped with her breathing some as well. Her other concern is that she occasionally has chest pain in the middle of her chest or left side. It usually occurs in the evenings, sometimes after she eats. Usually resolves on it's own after 15 minutes or less. Going on for over a year or longer. She denies any palpitations, dizziness, lightheadedness, arm pain, orthopnea, PND, or leg swelling. She has been evaluated by cardiology in the past for this without any significant findings. She takes protonix in the mornings for reflux.   FeNO 30 ppb  Allergies  Allergen Reactions   Codeine Nausea And Vomiting   Hydrocodone Nausea And Vomiting   Ibuprofen Other (See Comments)    MIGRAINES   Iodinated Contrast Media  Hives and Itching   Methotrexate Derivatives Other (See Comments)    GI UPSET   Oxycodone Nausea And Vomiting   Tylenol [Acetaminophen] Other (See Comments)    MIGRAINES   Aleve [Naproxen Sodium]    Sulfa Antibiotics    Neosporin [Neomycin-Bacitracin Zn-Polymyx] Swelling    AT SITE    Immunization History  Administered Date(s) Administered   Influenza,inj,Quad PF,6+ Mos 05/05/2014, 03/18/2015, 03/25/2016   Influenza-Unspecified 01/21/2013, 04/16/2017   Pneumococcal Polysaccharide-23 05/11/2020    Past Medical  History:  Diagnosis Date   Allergy    year-round, pt. states   Chronic lower back pain    CMC arthritis, thumb, degenerative 10/9483   right   Complication of anesthesia    slow to wake up after gallbladder surgery   Depression    Eczema    ARMS AND HANDS   GERD (gastroesophageal reflux disease)    History of thyroid cancer    Hypothyroidism    Migraines    Osteoarthritis    Overactive bladder    RA (rheumatoid arthritis) ([place])    Sleep apnea    no CPAP use   Squamous cell carcinoma of skin 12/21/2017   in situ-mid chest (CX35FU)   Ulcerative colitis ([place])     Tobacco History: Social History   Tobacco Use  Smoking Status Never  Smokeless Tobacco Never   Counseling given: Not Answered   Outpatient Medications Prior to Visit  Medication Sig Dispense Refill   fluticasone (FLONASE) 50 MCG/ACT nasal spray Place 1 spray into both nostrils daily. 16 g 2   ALPRAZolam (XANAX) 0.5 MG tablet Take only before procedures (injections into the neck and low back) up to 3x a day 30 tablet 0   Ascorbic Acid (VITAMIN C PO) Take 6,000 mg by mouth daily.     aspirin EC 325 MG tablet Take 325 mg by mouth 2 (two) times daily as needed for mild pain.      B Complex-C (SUPER B COMPLEX PO) Take 1 tablet by mouth daily.     Cholecalciferol (VITAMIN D3 PO) Take 1 tablet by mouth daily.      cyclobenzaprine (FLEXERIL) 10 MG tablet Take 10 mg by mouth 3 (three) times daily as needed for muscle spasms.     desvenlafaxine (PRISTIQ) 100 MG 24 hr tablet Take 100 mg by mouth daily.      Flax OIL Take by mouth daily.     gabapentin (NEURONTIN) 300 MG capsule Take 300 mg by mouth 4 (four) times daily.      golimumab (SIMPONI ARIA) 50 MG/4ML SOLN injection See admin instructions.     levothyroxine (SYNTHROID) 50 MCG tablet Take 50 mcg by mouth daily before breakfast.     mesalamine (LIALDA) 1.2 G EC tablet Take 2.4 g by mouth 2 (two) times daily.      oxybutynin (DITROPAN-XL) 10 MG 24 hr tablet Take 10  mg by mouth daily.     pantoprazole (PROTONIX) 40 MG tablet Take 40 mg by mouth daily.     Probiotic Product (PROBIOTIC DAILY PO) Take 1 tablet by mouth daily.      propranolol (INDERAL) 10 MG tablet Take 30-60 minutes prior to exercise for exercise-induced headaches. Max twice daily 90 tablet 1   propranolol ER (INDERAL LA) 80 MG 24 hr capsule Take 1 capsule (80 mg total) by mouth at bedtime. For migraine prevention and high blood pressure. 90 capsule 4   RESTASIS MULTIDOSE 0.05 % ophthalmic emulsion Place 1 drop into both  eyes 2 (two) times daily.     Rosuvastatin Calcium (CRESTOR PO) Take 20 mg by mouth daily.      SUMAtriptan (IMITREX) 100 MG tablet TAKE 1 TABLET BY MOUTH ONCE DAILY AS NEEDED. MAY REPEAT IN 2 HOURS IF HEADACHE PERSISTS OR RECURS. 10 tablet 11   thyroid (ARMOUR) 60 MG tablet Take 60 mg by mouth daily before breakfast.     zolpidem (AMBIEN CR) 12.5 MG CR tablet TAKE 1 TABLET (12.5 MG TOTAL) BY MOUTH AT BEDTIME. 30 tablet 5   zonisamide (ZONEGRAN) 100 MG capsule Take 1 capsule (100 mg total) by mouth daily. For migraine prevention. 90 capsule 3   No facility-administered medications prior to visit.     Review of Systems:   Constitutional: No weight loss or gain, night sweats, fevers, chills, fatigue, or lassitude. HEENT: No headaches, difficulty swallowing, tooth/dental problems, or sore throat. No sneezing, itching, ear ache. +nasal congestion, post nasal drip CV:  +intermittent chest pain (unchanged over past year or longer; not actively having chest pain). No orthopnea, PND, swelling in lower extremities, anasarca, dizziness, palpitations, syncope Resp: +shortness of breath with exertion; productive cough. No excess mucus or change in color of mucus.  No hemoptysis. No wheezing.  No chest wall deformity GI:  No heartburn, indigestion, abdominal pain, nausea, vomiting, diarrhea, change in bowel habits, loss of appetite, bloody stools.  MSK:  No joint pain or swelling.  No  decreased range of motion.  No back pain. Neuro: No dizziness or lightheadedness.  Psych: No depression or anxiety. Mood stable.     Physical Exam:  BP 130/70 (BP Location: Right Arm)   Pulse (!) 53   Ht '5\' 7"'$  (1.702 m)   Wt 179 lb 6.4 oz (81.4 kg)   SpO2 96%   BMI 28.10 kg/m   GEN: Pleasant, interactive, well-appearing; in no acute distress. HEENT:  Normocephalic and atraumatic. PERRLA. Sclera white. Nasal turbinates pale, edematous, moist and patent bilaterally. No rhinorrhea present. Oropharynx erythematous and moist, without exudate or edema. No lesions, ulcerations NECK:  Supple w/ fair ROM. No JVD present. Normal carotid impulses w/o bruits. Thyroid symmetrical with no goiter or nodules palpated. No lymphadenopathy.   CV: RRR, no m/r/g, no peripheral edema. Pulses intact, +2 bilaterally. No cyanosis, pallor or clubbing. PULMONARY:  Unlabored, regular breathing. Clear bilaterally A&P w/o wheezes/rales/rhonchi. No accessory muscle use. No dullness to percussion. GI: BS present and normoactive. Soft, non-tender to palpation. No organomegaly or masses detected. No CVA tenderness. MSK: No erythema, warmth or tenderness. Cap refil <2 sec all extrem. No deformities or joint swelling noted.  Neuro: A/Ox3. No focal deficits noted.   Skin: Warm, no lesions or rashe Psych: Normal affect and behavior. Judgement and thought content appropriate.     Lab Results:  CBC    Component Value Date/Time   WBC 7.7 01/27/2022 1445   RBC 4.43 01/27/2022 1445   HGB 13.2 01/27/2022 1445   HCT 40.2 01/27/2022 1445   PLT 246.0 01/27/2022 1445   MCV 90.8 01/27/2022 1445   MCV 95.5 01/24/2013 2043   MCH 30.7 01/15/2018 0206   MCHC 33.0 01/27/2022 1445   RDW 14.3 01/27/2022 1445   LYMPHSABS 2.1 01/27/2022 1445   MONOABS 0.8 01/27/2022 1445   EOSABS 0.2 01/27/2022 1445   BASOSABS 0.0 01/27/2022 1445    BMET    Component Value Date/Time   NA 142 07/01/2019 0926   K 4.4 07/01/2019 0926   CL  106 07/01/2019 0926   CO2  22 07/01/2019 0926   GLUCOSE 114 (H) 07/01/2019 0926   GLUCOSE 114 (H) 01/15/2018 0206   BUN 14 07/01/2019 0926   CREATININE 0.54 (L) 07/01/2019 0926   CALCIUM 9.5 07/01/2019 0926   GFRNONAA 99 07/01/2019 0926   GFRAA 115 07/01/2019 0926    BNP No results found for: "BNP"   Imaging:  No results found.       Latest Ref Rng & Units 01/27/2022   11:52 AM  PFT Results  FVC-Pre L 3.07   FVC-Predicted Pre % 89   FVC-Post L 2.82   FVC-Predicted Post % 81   Pre FEV1/FVC % % 83   Post FEV1/FCV % % 83   FEV1-Pre L 2.55   FEV1-Predicted Pre % 97   FEV1-Post L 2.34   DLCO uncorrected ml/min/mmHg 21.75   DLCO UNC% % 100   DLCO corrected ml/min/mmHg 21.75   DLCO COR %Predicted % 100   DLVA Predicted % 110   TLC L 5.73   TLC % Predicted % 104   RV % Predicted % 94     No results found for: "NITRICOXIDE"      Assessment & Plan:   Asthmatic bronchitis, mild persistent, uncomplicated Constellation of symptoms and environmental triggers along with history of recurrent bronchitis and eosinophilia consistent with underlying asthma. She had elevated exhaled nitric oxide today and felt albuterol opened up her chest today during PFTs. Given this, recommended we trial her on ICS/LABA therapy. Recheck CBC today to evaluate eosinophils.  Patient Instructions  Continue flonase nasal spray 2 sprays each nostril daily Continue Xyzal 1 tab daily for allergies  Continue protonix 40 mg daily  -Astelin 2 sprays each nostril daily for nasal congestion/runny nose -Trial Advair 2 puffs Twice daily. Brush tongue and rinse mouth afterwards -Pepcid (famotidine) 20 mg in the evenings for breakthrough reflux symptoms -Chlorpheniramine 4 mg over the counter tablet At bedtime for allergies/cough  Upper airway cough syndrome: Suppress your cough to allow your larynx (voice box) to heal.  Limit talking for the next few days. Avoid throat clearing. Work on cough suppression  with the above recommended suppressants.  Use sugar free hard candies or non-menthol cough drops during this time to soothe your throat.  Warm tea with honey and lemon.   Follow up in 4 weeks with Dr. Shearon Stalls (1st) or Katie Darius Lundberg,NP to see how Advair is working for you. If symptoms do not improve or worsen, please contact office for sooner follow up or seek emergency care.     Upper airway cough syndrome I do suspect that her cough is multifactorial related to asthmatic bronchitis and upper airway irritation. Recommended to continue flonase for postnasal drainage control and Xyzal for trigger prevention. Can use astelin as well for nasal congestion/drainage 1-2 times daily. Chlortab at night for cough/allergies. Reviewed measures to limit further irritation and reduce inflammation.   Atypical chest pain Previously documented in 11/2016. She reports this as an ongoing, intermittent problem that usually occurs in the evenings, sometimes after eating. Appears that this could be related to reflux/uncontrolled GERD. She is already on PPI therapy. Recommended she try adding pepcid in the evenings. If no improvement, we can refer her back to cardiology for further ischemic workup. ED precautions advised.   GERD (gastroesophageal reflux disease) See above plan.   I spent 42 minutes of dedicated to the care of this patient on the date of this encounter to include pre-visit review of records, face-to-face time with the patient discussing conditions  above, post visit ordering of testing, clinical documentation with the electronic health record, making appropriate referrals as documented, and communicating necessary findings to members of the patients care team.  Clayton Bibles, NP 01/27/2022  Pt aware and understands NP's role.

## 2022-01-27 NOTE — Progress Notes (Signed)
Full PFT Performed Today  

## 2022-01-27 NOTE — Assessment & Plan Note (Signed)
Previously documented in 11/2016. She reports this as an ongoing, intermittent problem that usually occurs in the evenings, sometimes after eating. Appears that this could be related to reflux/uncontrolled GERD. She is already on PPI therapy. Recommended she try adding pepcid in the evenings. If no improvement, we can refer her back to cardiology for further ischemic workup. ED precautions advised.

## 2022-01-27 NOTE — Assessment & Plan Note (Addendum)
Constellation of symptoms and environmental triggers along with history of recurrent bronchitis and eosinophilia consistent with underlying asthma. She had elevated exhaled nitric oxide today and felt albuterol opened up her chest today during PFTs. Given this, recommended we trial her on ICS/LABA therapy. Recheck CBC today to evaluate eosinophils.  Patient Instructions  Continue flonase nasal spray 2 sprays each nostril daily Continue Xyzal 1 tab daily for allergies  Continue protonix 40 mg daily  -Astelin 2 sprays each nostril daily for nasal congestion/runny nose -Trial Advair 2 puffs Twice daily. Brush tongue and rinse mouth afterwards -Pepcid (famotidine) 20 mg in the evenings for breakthrough reflux symptoms -Chlorpheniramine 4 mg over the counter tablet At bedtime for allergies/cough  Upper airway cough syndrome: Suppress your cough to allow your larynx (voice box) to heal.  Limit talking for the next few days. Avoid throat clearing. Work on cough suppression with the above recommended suppressants.  Use sugar free hard candies or non-menthol cough drops during this time to soothe your throat.  Warm tea with honey and lemon.   Follow up in 4 weeks with Dr. Shearon Stalls (1st) or Katie Gurkaran Rahm,NP to see how Advair is working for you. If symptoms do not improve or worsen, please contact office for sooner follow up or seek emergency care.

## 2022-02-09 DIAGNOSIS — Z96651 Presence of right artificial knee joint: Secondary | ICD-10-CM | POA: Diagnosis not present

## 2022-02-09 DIAGNOSIS — S8001XA Contusion of right knee, initial encounter: Secondary | ICD-10-CM | POA: Diagnosis not present

## 2022-02-15 DIAGNOSIS — Z1211 Encounter for screening for malignant neoplasm of colon: Secondary | ICD-10-CM | POA: Diagnosis not present

## 2022-02-15 DIAGNOSIS — K51018 Ulcerative (chronic) pancolitis with other complication: Secondary | ICD-10-CM | POA: Diagnosis not present

## 2022-02-15 DIAGNOSIS — R1013 Epigastric pain: Secondary | ICD-10-CM | POA: Diagnosis not present

## 2022-02-15 DIAGNOSIS — Z800 Family history of malignant neoplasm of digestive organs: Secondary | ICD-10-CM | POA: Diagnosis not present

## 2022-02-15 DIAGNOSIS — K219 Gastro-esophageal reflux disease without esophagitis: Secondary | ICD-10-CM | POA: Diagnosis not present

## 2022-02-17 DIAGNOSIS — M3501 Sicca syndrome with keratoconjunctivitis: Secondary | ICD-10-CM | POA: Diagnosis not present

## 2022-02-17 DIAGNOSIS — E663 Overweight: Secondary | ICD-10-CM | POA: Diagnosis not present

## 2022-02-17 DIAGNOSIS — M1991 Primary osteoarthritis, unspecified site: Secondary | ICD-10-CM | POA: Diagnosis not present

## 2022-02-17 DIAGNOSIS — M2550 Pain in unspecified joint: Secondary | ICD-10-CM | POA: Diagnosis not present

## 2022-02-17 DIAGNOSIS — Z6828 Body mass index (BMI) 28.0-28.9, adult: Secondary | ICD-10-CM | POA: Diagnosis not present

## 2022-02-17 DIAGNOSIS — M0579 Rheumatoid arthritis with rheumatoid factor of multiple sites without organ or systems involvement: Secondary | ICD-10-CM | POA: Diagnosis not present

## 2022-02-25 ENCOUNTER — Telehealth: Payer: Self-pay | Admitting: Nurse Practitioner

## 2022-02-25 DIAGNOSIS — M0579 Rheumatoid arthritis with rheumatoid factor of multiple sites without organ or systems involvement: Secondary | ICD-10-CM | POA: Diagnosis not present

## 2022-02-25 DIAGNOSIS — Z79899 Other long term (current) drug therapy: Secondary | ICD-10-CM | POA: Diagnosis not present

## 2022-02-25 DIAGNOSIS — R5383 Other fatigue: Secondary | ICD-10-CM | POA: Diagnosis not present

## 2022-02-25 NOTE — Telephone Encounter (Signed)
Fax received from Dr. Collene Mares with [facility] to perform a EGD and colonoscopy on patient.  Patient needs surgery clearance. Surgery is 03/30/2022. Patient was seen on 01/27/2022. Office protocol is a risk assessment can be sent to surgeon if patient has been seen in 60 days or less.   Sending to Roxan Diesel NP for risk assessment or recommendations if patient needs to be seen in office prior to surgical procedure.

## 2022-02-28 NOTE — Telephone Encounter (Signed)
Preop pulmonary evaluation for EGD/Colonoscopy under general anesthesia for Terry Shannon.  ASSESSMENT AND RECOMMENDATIONS:  Preoperative Risk Calculation: The features of this patient's history that contribute to the pulmonary risk assessment include: Age, General anesthesia  This patient has a low risk of post-operative pulmonary complications by ARISCAT Index.  The absolute assessment of risk/benefit of the procedure is deferred to the primary team's evaluation.  - Patient's Estimated risk of postoperative respiratory failure is low (3 points. 1.6%) based on the ARISCAT Index.   0 to 25 points: Low risk: 1.6% pulmonary complication rate  26 to 44 points: Intermediate risk: 10.9% pulmonary complication rate  45 to 123 points: High risk: 60.4% pulmonary complication rate  Postoperative respiratory failure (PRF) is considered as failure to wean from mechanical ventilation within 48 hours of surgery or unplanned intubation/reintubation postoperatively. The validated risk calculator provides a risk estimate of PRF and is anticipated to aid in surgical decision-making and informed patient consent.  However risk can be accepted given the potential benefit of this intervention and it is not prohibitive.   ARISCAT: Mazo et al. Anesthesiology [phone]  Lenice Llamas, MD Pulmonary and [place] 02/28/2022 1:02 PM Pager: see AMION  If no response to pager, please call critical care on call (see AMION) until 7pm After 7:00 pm call Elink

## 2022-03-01 ENCOUNTER — Ambulatory Visit (INDEPENDENT_AMBULATORY_CARE_PROVIDER_SITE_OTHER): Payer: Medicare Other | Admitting: Internal Medicine

## 2022-03-01 ENCOUNTER — Encounter: Payer: Self-pay | Admitting: Internal Medicine

## 2022-03-01 VITALS — BP 120/80 | HR 51 | Ht 67.0 in | Wt 178.2 lb

## 2022-03-01 DIAGNOSIS — R0602 Shortness of breath: Secondary | ICD-10-CM | POA: Diagnosis not present

## 2022-03-01 DIAGNOSIS — M25561 Pain in right knee: Secondary | ICD-10-CM | POA: Diagnosis not present

## 2022-03-01 DIAGNOSIS — K219 Gastro-esophageal reflux disease without esophagitis: Secondary | ICD-10-CM | POA: Diagnosis not present

## 2022-03-01 DIAGNOSIS — J310 Chronic rhinitis: Secondary | ICD-10-CM

## 2022-03-01 MED ORDER — IPRATROPIUM BROMIDE 0.02 % IN SOLN: 0.5000 mg | Freq: Four times a day (QID) | RESPIRATORY_TRACT | 12 refills | Status: AC

## 2022-03-01 NOTE — Progress Notes (Signed)
Terry Shannon    [id number]    [birth year]  Primary Care Physician:Shelton, Joelene Millin, MD Date of Appointment: 03/01/2022 Established Patient Visit  Chief complaint:   Chief Complaint  Patient presents with   Follow-up     HPI: Terry Shannon is a 66 y.o. woman with history of OSA on CPAP, RA, and ongoing dyspnea and GERD.  Interval Updates: Here for follow up. Saw Taylor in the office and was found to have elevated FENO (mildly at 30 ppb.) and ongoing bronchitis symptoms. Was started on ICS-LABA last month with advair. Also referred to GI for endoscopy.   Flonase gave headaches. She thought it helped with drainage but was not worth migraines.Also had migraines with advair. Stopped taking. Denies seasonal component to her rhinitis and cough.   She reports poor quality sleep. Sleep download reviewed 95% adherence with AHI 2.3 events/hr. 75% of the time she is using over 4 hours, sometimes falls asleep before putting it on the other 25% of the time hence the less than 4 hours use.   Still having shortness of breath.   I have reviewed the patient's family social and past medical history and updated as appropriate.   Past Medical History:  Diagnosis Date   Allergy    year-round, pt. states   Chronic lower back pain    CMC arthritis, thumb, degenerative 05/9377   right   Complication of anesthesia    slow to wake up after gallbladder surgery   Depression    Eczema    ARMS AND HANDS   GERD (gastroesophageal reflux disease)    History of thyroid cancer    Hypothyroidism    Migraines    Osteoarthritis    Overactive bladder    RA (rheumatoid arthritis) ([place])    Sleep apnea    no CPAP use   Squamous cell carcinoma of skin 12/21/2017   in situ-mid chest (CX35FU)   Ulcerative colitis [facility])     Past Surgical History:  Procedure Laterality Date   ABDOMINAL HERNIA REPAIR  02/40/9735   periumbilical ventral hernia/incisional hernia   BUNIONECTOMY Right    x 2 more    BUNIONECTOMY Left    BUNIONECTOMY WITH CHILECTOMY Right 12/16/2005   CARPAL TUNNEL RELEASE Right 07/13/2001   CARPAL TUNNEL RELEASE Left 08/10/2001   DILATION AND CURETTAGE OF UTERUS     HAMMER TOE SURGERY Left may 2014   HYSTEROSCOPY WITH D & C  03/01/2004   with exc. of endometrial polyp   KNEE ARTHROSCOPY Right 2013   LAPAROSCOPIC CHOLECYSTECTOMY  11/03/2006   LUMBAR FUSION  03/06/2014   l4  l5    nerve ablation lumbar      THYROIDECTOMY, PARTIAL Right prior to 2002   THYROIDECTOMY, PARTIAL Left 11/29/2000   and isthmus   TONSILLECTOMY  1961   TOTAL ANKLE REPLACEMENT Left 11/2016   with tendon repair   TOTAL KNEE ARTHROPLASTY Right 04/01/2013   Procedure: RIGHT TOTAL KNEE ARTHROPLASTY;  Surgeon: Alta Corning, MD;  Location: [place];  Service: Orthopedics;  Laterality: Right;    Family History  Problem Relation Age of Onset   Stroke Mother    Asthma Mother    Cancer Father 44       lung   Diabetes Brother    Barrett's esophagus Brother    Cancer Maternal Grandmother 36       colon   Stroke Maternal Grandmother    Stroke Maternal Grandfather  Diabetes Paternal Grandmother     Social History   Occupational History   Not on file  Tobacco Use   Smoking status: Never   Smokeless tobacco: Never  Vaping Use   Vaping Use: Never used  Substance and Sexual Activity   Alcohol use: Yes    Comment: 2-3 drinks/week   Drug use: No   Sexual activity: Not Currently    Birth control/protection: None     Physical Exam: Blood pressure 120/80, pulse (!) 51, height '5\' 7"'$  (1.702 m), weight 178 lb 3.2 oz (80.8 kg), SpO2 95 %.  Gen:      No acute distress ENT:  no nasal polyps, mucus membranes moist Lungs:    No increased respiratory effort, symmetric chest wall excursion, clear to auscultation bilaterally, no wheezes or crackles CV:         Regular rate and rhythm; no murmurs, rubs, or gallops.  No pedal edema   Data Reviewed: Imaging: I have personally reviewed the chest  xray June 2023 no acute process  PFTs:     Latest Ref Rng & Units 01/27/2022   11:52 AM  PFT Results  FVC-Pre L 3.07   FVC-Predicted Pre % 89   FVC-Post L 2.82   FVC-Predicted Post % 81   Pre FEV1/FVC % % 83   Post FEV1/FCV % % 83   FEV1-Pre L 2.55   FEV1-Predicted Pre % 97   FEV1-Post L 2.34   DLCO uncorrected ml/min/mmHg 21.75   DLCO UNC% % 100   DLCO corrected ml/min/mmHg 21.75   DLCO COR %Predicted % 100   DLVA Predicted % 110   TLC L 5.73   TLC % Predicted % 104   RV % Predicted % 94    I have personally reviewed the patient's PFTs and they show normal pulmonary function.  Labs:  Immunization status: Immunization History  Administered Date(s) Administered   Influenza,inj,Quad PF,6+ Mos 05/05/2014, 03/18/2015, 03/25/2016   Influenza-Unspecified 01/21/2013, 04/16/2017   Pneumococcal Polysaccharide-23 05/11/2020    External Records Personally Reviewed: pulmonary  Assessment:  Shortness of breath Chronic cough - likely multifactorial from GERD and chronic rhinitis Rheumatoid Arthritis GERD  Plan/Recommendations:  Suspect her mildly elevated feno could be a false positive especially given uncontrolled reflux symptoms.   Region 2 allergy panel today for rhinitis. Intolerant of ICS due to migraines. Continue xyzal.  Let's try ipratropium nasal spray to see if that helps your nasal drainage.  Take one spray each side of your nose twice daily to start. If it helps you can take it up four times a day.   Would stop inhalers for now. I think it's unlikely you shortness of breath is related to asthma and it's not worth the adverse effects. We can make sure your heart is ok  - I have placed referral to cardiology today. I do not think your shortness of breath is related to lungs.  Your sleep apnea seems to be doing well - you are using it effectively 75% of the time, just need to work on setting reminders to wear it the other 25% of the time for long enough.   I think  most of the cough is a combination of nasal drainage with reflux. Endoscopy will be a good idea.    Return to Care: Return in about 6 months (around 08/31/2022).   Lenice Llamas, MD Pulmonary and [place]

## 2022-03-01 NOTE — Patient Instructions (Addendum)
Please schedule follow up scheduled with myself in 6 months.  If my schedule is not open yet, we will contact you with a reminder closer to that time. Please call [phone] if you haven't heard from [place] a month before.   Before your next visit I would like you to have: Blood work - done today for allergies. I will contact you through mychart with the results.  Let's try ipratropium nasal spray to see if that helps your nasal drainage.  Take one spray each side of your nose twice daily to start. If it helps you can take it up four times a day.   Would stop inhalers for now. I think it's unlikely you shortness of breath is related to asthma and it's not worth the adverse effects. We can make sure your heart is ok  - I have placed referral to cardiology today. I do not think your shortness of breath is related to lungs.  Your sleep apnea seems to be doing well - you are using it effectively 75% of the time, just need to work on setting reminders to wear it the other 25% of the time for long enough.   I think most of the cough is a combination of nasal drainage with reflux. Endoscopy will be a good idea.      What is GERD? Gastroesophageal reflux disease (GERD) is gastroesophageal reflux diseasewhich occurs when the lower esophageal sphincter (LES) opens spontaneously, for varying periods of time, or does not close properly and stomach contents rise up into the esophagus. GER is also called acid reflux or acid regurgitation, because digestive juices--called acids--rise up with the food. The esophagus is the tube that carries food from the mouth to the stomach. The LES is a ring of muscle at the bottom of the esophagus that acts like a valve between the esophagus and stomach.  When acid reflux occurs, food or fluid can be tasted in the back of the mouth. When refluxed stomach acid touches the lining of the esophagus it may cause a burning sensation in the chest or throat called heartburn or acid  indigestion. Occasional reflux is common. Persistent reflux that occurs more than twice a week is considered GERD, and it can eventually lead to more serious health problems. People of all ages can have GERD. Studies have shown that GERD may worsen or contribute to asthma, chronic cough, and pulmonary fibrosis.   What are the symptoms of GERD? The main symptom of GERD in adults is frequent heartburn, also called acid indigestion--burning-type pain in the lower part of the mid-chest, behind the breast bone, and in the mid-abdomen.  Not all reflux is acidic in nature, and many patients don't have heart burn at all. Sometimes it feels like a cough (either dry or with mucus), choking sensation, asthma, shortness of breath, waking up at night, frequent throat clearing, or trouble swallowing.    What causes GERD? The reason some people develop GERD is still unclear. However, research shows that in people with GERD, the LES relaxes while the rest of the esophagus is working. Anatomical abnormalities such as a hiatal hernia may also contribute to GERD. A hiatal hernia occurs when the upper part of the stomach and the LES move above the diaphragm, the muscle wall that separates the stomach from the chest. Normally, the diaphragm helps the LES keep acid from rising up into the esophagus. When a hiatal hernia is present, acid reflux can occur more easily. A hiatal hernia can  occur in people of any age and is most often a normal finding in otherwise healthy people over age 52. Most of the time, a hiatal hernia produces no symptoms.   Other factors that may contribute to GERD include - Obesity or recent weight gain - Pregnancy  - Smoking  - Diet - Certain medications  Common foods that can worsen reflux symptoms include: - carbonated beverages - artificial sweeteners - citrus fruits  - chocolate  - drinks with caffeine or alcohol  - fatty and fried foods  - garlic and onions  - mint flavorings  - spicy  foods  - tomato-based foods, like spaghetti sauce, salsa, chili, and pizza   Lifestyle Changes If you smoke, stop.  Avoid foods and beverages that worsen symptoms (see above.) Lose weight if needed.  Eat small, frequent meals.  Wear loose-fitting clothes.  Avoid lying down for 3 hours after a meal.  Raise the head of your bed 6 to 8 inches by securing wood blocks under the bedposts. Just using extra pillows will not help, but using a wedge-shaped pillow may be helpful.  Medications  H2 blockers, such as cimetidine (Tagamet HB), famotidine (Pepcid AC), nizatidine (Axid AR), and ranitidine (Zantac 75), decrease acid production. They are available in prescription strength and over-the-counter strength. These drugs provide short-term relief and are effective for about half of those who have GERD symptoms.  Proton pump inhibitors include omeprazole (Prilosec, Zegerid), lansoprazole (Prevacid), pantoprazole (Protonix), rabeprazole (Aciphex), and esomeprazole (Nexium), which are available by prescription. Prilosec is also available in over-the-counter strength. Proton pump inhibitors are more effective than H2 blockers and can relieve symptoms and heal the esophageal lining in almost everyone who has GERD.  Because drugs work in different ways, combinations of medications may help control symptoms. People who get heartburn after eating may take both antacids and H2 blockers. The antacids work first to neutralize the acid in the stomach, and then the H2 blockers act on acid production. By the time the antacid stops working, the H2 blocker will have stopped acid production. Your health care provider is the best source of information about how to use medications for GERD.   Points to Remember 1. You can have GERD without having heartburn. Your symptoms could include a dry cough, asthma symptoms, or trouble swallowing.  2. Taking medications daily as prescribed is important in controlling you symptoms.   Sometimes it can take up to 8 weeks to fully achieve the effects of the medications prescribed.  3. Coughing related to GERD can be difficult to treat and is very frustrating!  However, it is important to stick with these medications and lifestyle modifications before pursuing more aggressive or invasive test and treatments.

## 2022-03-01 NOTE — Telephone Encounter (Signed)
OV notes and clearance form have been faxed back to [facility]. Nothing further needed at this time.

## 2022-03-02 ENCOUNTER — Other Ambulatory Visit (HOSPITAL_COMMUNITY): Payer: Self-pay

## 2022-03-02 ENCOUNTER — Telehealth: Payer: Self-pay

## 2022-03-02 LAB — RESPIRATORY ALLERGY PROFILE REGION II ~~LOC~~

## 2022-03-02 LAB — INTERPRETATION:

## 2022-03-02 NOTE — Telephone Encounter (Signed)
PA DENIED for IPRATROPIUM BROMIDE 0.02% SOLUTION due to:   The information received does not support approval under your Medicare Part D benefit; however,  coverage or payment may be available under your Part A or Part B benefit.  Pharmacy must run under Medicare Part B benefit.

## 2022-03-02 NOTE — Telephone Encounter (Signed)
PA received through [facility]. PA has been completed and submitted to [facility].   Awaiting determination.   Key: [id number] - PA Case ID: [id number]

## 2022-03-03 ENCOUNTER — Encounter: Payer: Self-pay | Admitting: Internal Medicine

## 2022-03-03 ENCOUNTER — Ambulatory Visit: Payer: Medicare Other | Attending: Internal Medicine | Admitting: Internal Medicine

## 2022-03-03 ENCOUNTER — Other Ambulatory Visit: Payer: Self-pay | Admitting: Internal Medicine

## 2022-03-03 VITALS — BP 128/74 | HR 57 | Ht 66.0 in | Wt 175.8 lb

## 2022-03-03 DIAGNOSIS — R0600 Dyspnea, unspecified: Secondary | ICD-10-CM | POA: Diagnosis not present

## 2022-03-03 MED ORDER — IPRATROPIUM BROMIDE 0.03 % NA SOLN: 2.0000 | Freq: Two times a day (BID) | NASAL | 12 refills | Status: DC

## 2022-03-03 NOTE — Patient Instructions (Addendum)
Medication Instructions:  No Changes In Medications at this time.  *If you need a refill on your cardiac medications before your next appointment, please call your pharmacy*  Lab Work: None Ordered At This Time.  If you have labs (blood work) drawn today and your tests are completely normal, you will receive your results only by: [place] (if you have MyChart) OR A paper copy in the mail If you have any lab test that is abnormal or we need to change your treatment, we will call you to review the results.  Testing/Procedures:  The test will take approximately 3 to 4 hours to complete; you may bring reading material.  If someone comes with you to your appointment, they will need to remain in the main lobby due to limited space in the testing area.    How to prepare for your Myocardial Perfusion Test: Do not eat or drink 3 hours prior to your test, except you may have water. Do not consume products containing caffeine (regular or decaffeinated) 12 hours prior to your test. (ex: coffee, chocolate, sodas, tea). Do wear comfortable clothes (no dresses or overalls) and walking shoes, tennis shoes preferred (No heels or open toe shoes are allowed). Do NOT wear cologne, perfume, aftershave, or lotions (deodorant is allowed). If you use an inhaler, use it the AM of your test and bring it with you.  If you use a nebulizer, use it the AM of your test.  If these instructions are not followed, your test will have to be rescheduled. This will take place at [address] [place] has requested that you have an echocardiogram. Echocardiography is a painless test that uses sound waves to create images of your heart. It provides your doctor with information about the size and shape of your heart and how well your heart's chambers and valves are working. You may receive an ultrasound enhancing agent through an IV if needed to better visualize your heart during the echo.This procedure  takes approximately one hour. There are no restrictions for this procedure. This will take place at the [address].   Follow-Up: At [facility], you and your health needs are our priority.  As part of our continuing mission to provide you with exceptional heart care, we have created designated Provider Care Teams.  These Care Teams include your primary Cardiologist (physician) and Advanced Practice Providers (APPs -  Physician Assistants and Nurse Practitioners) who all work together to provide you with the care you need, when you need it.  Your next appointment:   AS NEEDED   The format for your next appointment:   In Person  Provider:   Janina Mayo, MD

## 2022-03-03 NOTE — Addendum Note (Signed)
Addended by: Rexanne Mano B on: 03/03/2022 10:17 AM   Modules accepted: Orders

## 2022-03-03 NOTE — Progress Notes (Signed)
Cardiology Office Note:    Date:  03/03/2022   ID:  Terry Shannon, DOB [birth year], MRN [id number]  PCP:  Willey Blade, [place] [facility] Cardiologist:  Janina Mayo, MD     Referring MD: Willey Blade, MD   Chief Complaint  Patient presents with   New Patient (Initial Visit)  SOB  History of Present Illness:    Terry Shannon is a 66 y.o. female with a hx of depression, thyroid dx eczema, GERD, UC, sleep apnea -on CPAP, referral for SOB. Prior ECGs with sinus rhythm/sinus arrhythmia with LVH. SPECT 12/14/2016 ; no ST changes on ecg, normal perfusion. Normal EF, no WMA. She saw Dr. Newman Pies at that time at [facility] for a pre-op for ankle procedure. She noted atypical CP at that time as well, not related to activity. She has SOB and saw pulm. She had normal PFTs. CPAP managed with them.   Today she reports , she has trouble breathing. She notes with eating. She feels like she is not getting enough O2. No physical activity. She denies PND/orthopnea, no LE edema. No chest pressure. Non smoker. No syncope.  Past Medical History:  Diagnosis Date   Allergy    year-round, pt. 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   right   Complication of anesthesia    slow to wake up after gallbladder surgery   Depression    Eczema    ARMS AND HANDS   GERD (gastroesophageal reflux disease)    History of thyroid cancer    Hypothyroidism    Migraines    Osteoarthritis    Overactive bladder    RA (rheumatoid arthritis) ([place])    Sleep apnea    no CPAP use   Squamous cell carcinoma of skin 12/21/2017   in situ-mid chest (CX35FU)   Ulcerative colitis [facility])     Past Surgical History:  Procedure Laterality Date   ABDOMINAL HERNIA REPAIR  33/82/5053   periumbilical ventral hernia/incisional hernia   BUNIONECTOMY Right    x 2 more   BUNIONECTOMY Left    BUNIONECTOMY WITH CHILECTOMY Right 12/16/2005   CARPAL TUNNEL RELEASE Right  07/13/2001   CARPAL TUNNEL RELEASE Left 08/10/2001   DILATION AND CURETTAGE OF UTERUS     HAMMER TOE SURGERY Left may 2014   HYSTEROSCOPY WITH D & C  03/01/2004   with exc. of endometrial polyp   KNEE ARTHROSCOPY Right 2013   LAPAROSCOPIC CHOLECYSTECTOMY  11/03/2006   LUMBAR FUSION  03/06/2014   l4  l5    nerve ablation lumbar      THYROIDECTOMY, PARTIAL Right prior to 2002   THYROIDECTOMY, PARTIAL Left 11/29/2000   and isthmus   TONSILLECTOMY  1961   TOTAL ANKLE REPLACEMENT Left 11/2016   with tendon repair   TOTAL KNEE ARTHROPLASTY Right 04/01/2013   Procedure: RIGHT TOTAL KNEE ARTHROPLASTY;  Surgeon: Alta Corning, MD;  Location: [facility];  Service: Orthopedics;  Laterality: Right;    Current Medications: Current Meds  Medication Sig   ALPRAZolam (XANAX) 0.5 MG tablet Take only before procedures (injections into the neck and low back) up to 3x a day   Ascorbic Acid (VITAMIN C PO) Take 6,000 mg by mouth daily.   aspirin EC 325 MG tablet Take 325 mg by mouth 2 (two) times daily as needed for mild pain.    B Complex-C (SUPER B COMPLEX PO) Take 1 tablet by mouth daily.  Cholecalciferol (VITAMIN D3 PO) Take 1 tablet by mouth daily.    cyclobenzaprine (FLEXERIL) 10 MG tablet Take 10 mg by mouth 3 (three) times daily as needed for muscle spasms.   desvenlafaxine (PRISTIQ) 100 MG 24 hr tablet Take 100 mg by mouth daily.    Flax OIL Take by mouth daily.   gabapentin (NEURONTIN) 300 MG capsule Take 300 mg by mouth 4 (four) times daily.    golimumab (SIMPONI ARIA) 50 MG/4ML SOLN injection See admin instructions.   levothyroxine (SYNTHROID) 50 MCG tablet Take 50 mcg by mouth daily before breakfast.   mesalamine (LIALDA) 1.2 G EC tablet Take 2.4 g by mouth 2 (two) times daily.    oxybutynin (DITROPAN-XL) 10 MG 24 hr tablet Take 10 mg by mouth daily.   pantoprazole (PROTONIX) 40 MG tablet Take 40 mg by mouth daily.   Probiotic Product (PROBIOTIC DAILY PO) Take 1 tablet by mouth daily.     propranolol (INDERAL) 10 MG tablet Take 30-60 minutes prior to exercise for exercise-induced headaches. Max twice daily   propranolol ER (INDERAL LA) 80 MG 24 hr capsule Take 1 capsule (80 mg total) by mouth at bedtime. For migraine prevention and high blood pressure.   RESTASIS MULTIDOSE 0.05 % ophthalmic emulsion Place 1 drop into both eyes 2 (two) times daily.   Rosuvastatin Calcium (CRESTOR PO) Take 20 mg by mouth daily.    sucralfate (CARAFATE) 1 g tablet Take 1 g by mouth 4 (four) times daily.   SUMAtriptan (IMITREX) 100 MG tablet TAKE 1 TABLET BY MOUTH ONCE DAILY AS NEEDED. MAY REPEAT IN 2 HOURS IF HEADACHE PERSISTS OR RECURS.   thyroid (ARMOUR) 60 MG tablet Take 60 mg by mouth daily before breakfast.   zolpidem (AMBIEN CR) 12.5 MG CR tablet TAKE 1 TABLET (12.5 MG TOTAL) BY MOUTH AT BEDTIME.   zonisamide (ZONEGRAN) 100 MG capsule Take 1 capsule (100 mg total) by mouth daily. For migraine prevention.     Allergies:   Codeine, Hydrocodone, Ibuprofen, Iodinated contrast media, Methotrexate derivatives, Oxycodone, Tylenol [acetaminophen], Aleve [naproxen sodium], Sulfa antibiotics, and Neosporin [neomycin-bacitracin zn-polymyx]   Social History   Socioeconomic History   Marital status: Significant Other    Spouse name: Not on file   Number of children: 0   Years of education: Not on file   Highest education level: Bachelor's degree (e.g., BA, AB, BS)  Occupational History   Not on file  Tobacco Use   Smoking status: Never   Smokeless tobacco: Never  Vaping Use   Vaping Use: Never used  Substance and Sexual Activity   Alcohol use: Yes    Comment: 2-3 drinks/week   Drug use: No   Sexual activity: Not Currently    Birth control/protection: None  Other Topics Concern   Not on file  Social History Narrative   Lives at home with her partner and two daughters   Right handed   Caffeine; 1 cup/daily.     Social Determinants of Health   Financial Resource Strain: Not on file   Food Insecurity: Not on file  Transportation Needs: Not on file  Physical Activity: Not on file  Stress: Not on file  Social Connections: Not on file     Family History: The patient's family history includes Asthma in her mother; Barrett's esophagus in her brother; Cancer (age of onset: 72) in her father; Cancer (age of onset: 65) in her maternal grandmother; Diabetes in her brother and paternal grandmother; Stroke in her maternal grandfather, maternal  grandmother, and mother.  ROS:   Please see the history of present illness.     All other systems reviewed and are negative.  EKGs/Labs/Other Studies Reviewed:    The following studies were reviewed today:   EKG:  EKG is  ordered today.  The ekg ordered today demonstrates   03/03/2022- sinus bradycardia w/ PACs, minimal LVH  Recent Labs: 01/27/2022: Hemoglobin 13.2; Platelets 246.0   Recent Lipid Panel No results found for: "CHOL", "TRIG", "HDL", "CHOLHDL", "VLDL", "LDLCALC", "LDLDIRECT"   Risk Assessment/Calculations:         Physical Exam:    VS:   Vitals:   03/03/22 0944  BP: 128/74  Pulse: (!) 57  SpO2: 98%     Wt Readings from Last 3 Encounters:  03/03/22 175 lb 12.8 oz (79.7 kg)  03/01/22 178 lb 3.2 oz (80.8 kg)  01/27/22 179 lb 6.4 oz (81.4 kg)     GEN:  Well nourished, well developed in no acute distress HEENT: Normal NECK: No JVD; No carotid bruits LYMPHATICS: No lymphadenopathy CARDIAC: RRR, no murmurs, rubs, gallops RESPIRATORY:  Clear to auscultation without rales, wheezing or rhonchi  ABDOMEN: Soft, non-tender, non-distended MUSCULOSKELETAL:  No edema; No deformity  SKIN: Warm and dry NEUROLOGIC:  Alert and oriented x 3 PSYCHIATRIC:  Normal affect   ASSESSMENT:    SOB: She has some risk for coronary dx 2/2 age. Her symptoms are not typical for cardiac disease however. She has no signs of heart failure.  Will start with SPECT and TTE to ensure no coronary or structural/valvular cardiac  dx. PLAN:    In order of problems listed above:  Exercise SPECT TTE       Medication Adjustments/Labs and Tests Ordered: Current medicines are reviewed at length with the patient today.  Concerns regarding medicines are outlined above.  Orders Placed This Encounter  Procedures   MYOCARDIAL PERFUSION IMAGING   EKG 12-Lead   ECHOCARDIOGRAM COMPLETE   No orders of the defined types were placed in this encounter.   Patient Instructions  Medication Instructions:  No Changes In Medications at this time.  *If you need a refill on your cardiac medications before your next appointment, please call your pharmacy*  Lab Work: None Ordered At This Time.  If you have labs (blood work) drawn today and your tests are completely normal, you will receive your results only by: [place] (if you have MyChart) OR A paper copy in the mail If you have any lab test that is abnormal or we need to change your treatment, we will call you to review the results.  Testing/Procedures: Your physician has requested that you have a lexiscan myoview. For further information please visit [email or web address]. Please follow instruction sheet, as given. This will take place at [address] [place] has requested that you have an echocardiogram. Echocardiography is a painless test that uses sound waves to create images of your heart. It provides your doctor with information about the size and shape of your heart and how well your heart's chambers and valves are working. You may receive an ultrasound enhancing agent through an IV if needed to better visualize your heart during the echo.This procedure takes approximately one hour. There are no restrictions for this procedure. This will take place at the [address].   Follow-Up: At [facility], you and your health needs are our priority.  As part of our continuing mission to provide you with  exceptional heart  care, we have created designated Provider Care Teams.  These Care Teams include your primary Cardiologist (physician) and Advanced Practice Providers (APPs -  Physician Assistants and Nurse Practitioners) who all work together to provide you with the care you need, when you need it.  Your next appointment:   AS NEEDED   The format for your next appointment:   In Person  Provider:   Janina Mayo, MD           Signed, Janina Mayo, MD  03/03/2022 10:15 AM    [place]

## 2022-03-03 NOTE — Addendum Note (Signed)
Addended byPhineas Inches on: 03/03/2022 10:18 AM   Modules accepted: Orders

## 2022-03-03 NOTE — Addendum Note (Signed)
Addended by: Lenice Llamas on: 03/03/2022 02:25 PM   Modules accepted: Orders

## 2022-03-13 ENCOUNTER — Other Ambulatory Visit: Payer: Self-pay

## 2022-03-13 ENCOUNTER — Encounter (HOSPITAL_BASED_OUTPATIENT_CLINIC_OR_DEPARTMENT_OTHER): Payer: Self-pay | Admitting: Emergency Medicine

## 2022-03-13 ENCOUNTER — Emergency Department (HOSPITAL_BASED_OUTPATIENT_CLINIC_OR_DEPARTMENT_OTHER)
Admission: EM | Admit: 2022-03-13 | Discharge: 2022-03-13 | Disposition: A | Discharge status: Home / Self Care | Payer: Medicare Other | Attending: Emergency Medicine | Admitting: Emergency Medicine

## 2022-03-13 DIAGNOSIS — Z7982 Long term (current) use of aspirin: Secondary | ICD-10-CM | POA: Insufficient documentation

## 2022-03-13 DIAGNOSIS — B309 Viral conjunctivitis, unspecified: Secondary | ICD-10-CM | POA: Insufficient documentation

## 2022-03-13 DIAGNOSIS — H1089 Other conjunctivitis: Secondary | ICD-10-CM | POA: Diagnosis not present

## 2022-03-13 DIAGNOSIS — H5789 Other specified disorders of eye and adnexa: Secondary | ICD-10-CM | POA: Diagnosis present

## 2022-03-13 MED ORDER — FLUORESCEIN SODIUM 1 MG OP STRP
1.0000 | ORAL_STRIP | Freq: Once | OPHTHALMIC | Status: DC
  Filled 2022-03-13: qty 1

## 2022-03-13 MED ORDER — NAPHAZOLINE-PHENIRAMINE 0.025-0.3 % OP SOLN: 1.0000 [drp] | Freq: Four times a day (QID) | OPHTHALMIC | 0 refills | Status: AC | PRN

## 2022-03-13 MED ORDER — TETRACAINE HCL 0.5 % OP SOLN
2.0000 [drp] | Freq: Once | OPHTHALMIC | Status: DC
  Filled 2022-03-13: qty 4

## 2022-03-13 NOTE — ED Notes (Signed)
Reviewed AVS/discharge instruction with patient. Time allotted for and all questions answered. Patient is agreeable for d/c and escorted to ed exit by staff.  

## 2022-03-13 NOTE — Discharge Instructions (Addendum)
As we discussed, your work-up in the ER today was reassuring for acute findings.  Visualization of your eye did not reveal any emergent concerns.  Unfortunately, I am unable to refill that medication that has been beneficial for you previously as it is a steroid cream and that is reserved for ophthalmology only.  I strongly suggest that you call your ophthalmologist first thing tomorrow morning to schedule an appointment for continued evaluation and management of your symptoms.  In the interim, please do cold compresses regularly for irritation and use the naphazoline solution as prescribed for any residual symptoms.   Return if development of any new or worsening symptoms.

## 2022-03-13 NOTE — ED Triage Notes (Signed)
Pt via pov from home with eye irritation in her left eye that seems to be moving toward the right eye. Pt states she has been on drops for several years for dry eyes and they have begun to feel itchy and swollen in the last 2 weeks. Pt alert & oriented, nad noted.

## 2022-03-13 NOTE — ED Provider Notes (Signed)
[facility] EMERGENCY DEPT Provider Note   CSN: [id number] Arrival date & time: 03/13/22  1252     History  Chief Complaint  Patient presents with   Eye Problem    Terry Shannon is a 66 y.o. female.  Patient with history of hypothyroidism, rheumatoid arthritis, ulcerative colitis presents today with complaints of eye irritation.  She states that she has struggled off and on with dry eyes for several years.  She has followed by ophthalmology for this and is currently taking Lotemax, systane, and loteprednol etabonate gel with some relief but was told she should not take steroid eyedrops all the time.  She states that over the last 2 weeks she has had a flare of her eye pain consistent with previous flares.  States that this time is worse in the left eye.  She denies any changes in her vision.  States that she has not had any drainage.  Does states she had some crusting over her eye this morning but no significant swelling.  Denies fevers or chills.  The history is provided by the patient. No language interpreter was used.  Eye Problem Associated symptoms: itching   Associated symptoms: no photophobia        Home Medications Prior to Admission medications   Medication Sig Start Date End Date Taking? Authorizing Provider  ALPRAZolam Duanne Moron) 0.5 MG tablet Take only before procedures (injections into the neck and low back) up to 3x a day 12/15/20   Melvenia Beam, MD  Ascorbic Acid (VITAMIN C PO) Take 6,000 mg by mouth daily.    [provider]  aspirin EC 325 MG tablet Take 325 mg by mouth 2 (two) times daily as needed for mild pain.     [provider]  B Complex-C (SUPER B COMPLEX PO) Take 1 tablet by mouth daily.    [provider]  Cholecalciferol (VITAMIN D3 PO) Take 1 tablet by mouth daily.     [provider]  cyclobenzaprine (FLEXERIL) 10 MG tablet Take 10 mg by mouth 3 (three) times daily as needed for muscle spasms.     [provider]  desvenlafaxine (PRISTIQ) 100 MG 24 hr tablet Take 100 mg by mouth daily.     [provider]  Flax OIL Take by mouth daily.    [provider]  gabapentin (NEURONTIN) 300 MG capsule Take 300 mg by mouth 4 (four) times daily.     [provider]  golimumab ([place] ARIA) 50 MG/4ML SOLN injection See admin instructions.    [provider]  ipratropium (ATROVENT) 0.02 % nebulizer solution Take 2.5 mLs (0.5 mg total) by nebulization 4 (four) times daily. Patient not taking: Reported on 03/03/2022 03/01/22   Spero Geralds, MD  ipratropium (ATROVENT) 0.03 % nasal spray Place 2 sprays into both nostrils every 12 (twelve) hours. 03/03/22   Spero Geralds, MD  levothyroxine (SYNTHROID) 50 MCG tablet Take 50 mcg by mouth daily before breakfast.    [provider]  mesalamine (LIALDA) 1.2 G EC tablet Take 2.4 g by mouth 2 (two) times daily.     [provider]  oxybutynin (DITROPAN-XL) 10 MG 24 hr tablet Take 10 mg by mouth daily.    [provider]  pantoprazole (PROTONIX) 40 MG tablet Take 40 mg by mouth daily.    [provider]  Probiotic Product (PROBIOTIC DAILY PO) Take 1 tablet by mouth daily.     [provider]  propranolol (INDERAL) 10  MG tablet Take 30-60 minutes prior to exercise for exercise-induced headaches. Max twice daily 12/15/21   Melvenia Beam, MD  propranolol ER (INDERAL LA) 80 MG 24 hr capsule Take 1 capsule (80 mg total) by mouth at bedtime. For migraine prevention and high blood pressure. 12/15/21   Melvenia Beam, MD  RESTASIS MULTIDOSE 0.05 % ophthalmic emulsion Place 1 drop into both eyes 2 (two) times daily. 10/09/18   [provider]  Rosuvastatin Calcium (CRESTOR PO) Take 20 mg by mouth daily.     [provider]  sucralfate (CARAFATE) 1 g tablet Take 1 g by mouth 4 (four) times daily. 02/15/22   [provider]  SUMAtriptan (IMITREX) 100 MG  tablet TAKE 1 TABLET BY MOUTH ONCE DAILY AS NEEDED. MAY REPEAT IN 2 HOURS IF HEADACHE PERSISTS OR RECURS. 12/15/21   Melvenia Beam, MD  thyroid (ARMOUR) 60 MG tablet Take 60 mg by mouth daily before breakfast.    [provider]  zolpidem (AMBIEN CR) 12.5 MG CR tablet TAKE 1 TABLET (12.5 MG TOTAL) BY MOUTH AT BEDTIME. 11/08/21   Dohmeier, Asencion Partridge, MD  zonisamide (ZONEGRAN) 100 MG capsule Take 1 capsule (100 mg total) by mouth daily. For migraine prevention. 12/15/21   Melvenia Beam, MD      Allergies    Codeine, Hydrocodone, Ibuprofen, Iodinated contrast media, Methotrexate derivatives, Oxycodone, Tylenol [acetaminophen], Aleve [naproxen sodium], Sulfa antibiotics, and Neosporin [neomycin-bacitracin zn-polymyx]    Review of Systems   Review of Systems  Eyes:  Positive for pain and itching. Negative for photophobia and visual disturbance.  All other systems reviewed and are negative.   Physical Exam Updated Vital Signs BP (!) 161/79 (BP Location: Left Arm)   Pulse (!) 51   Temp 99.1 F (37.3 C)   Resp 16   Ht '5\' 6"'$  (1.676 m)   Wt 81.6 kg   SpO2 97%   BMI 29.05 kg/m  Physical Exam Vitals and nursing note reviewed.  Constitutional:      General: She is not in acute distress.    Appearance: Normal appearance. She is normal weight. She is not ill-appearing, toxic-appearing or diaphoretic.  HENT:     Head: Normocephalic and atraumatic.  Eyes:     Comments: PERRLA, EOMs intact without pain.  Right eye normal, left eye without periorbital swelling.  No chemosis or exophthalmos.  No purulent drainage.  Mild conjunctival erythema fluorescein stain without uptake.  No chalazion or hordeolum  Cardiovascular:     Rate and Rhythm: Normal rate.  Pulmonary:     Effort: Pulmonary effort is normal. No respiratory distress.  Musculoskeletal:        General: Normal range of motion.     Cervical back: Normal range of motion.  Skin:    General: Skin is warm and dry.  Neurological:      General: No focal deficit present.     Mental Status: She is alert.  Psychiatric:        Mood and Affect: Mood normal.        Behavior: Behavior normal.     ED Results / Procedures / Treatments   Labs (all labs ordered are listed, but only abnormal results are displayed) Labs Reviewed - No data to display  EKG None  Radiology No results found.  Procedures Procedures    Medications Ordered in ED Medications  tetracaine (PONTOCAINE) 0.5 % ophthalmic solution 2 drop (has no administration in time range)  fluorescein ophthalmic strip 1 strip (  has no administration in time range)    ED Course/ Medical Decision Making/ A&P                           Medical Decision Making Risk Prescription drug management.    Patient presents today with complaints of left eye irritation.  She is afebrile, nontoxic-appearing, in no acute distress with reassuring vital signs.  Visual acuity grossly intact.  Fluorescein stain without uptake.  Some tearing noted without purulence.  Pt dx likely viral conjunctivitis vs flare of her previously diagnosed dry eyes based on presentation & eye exam, no antibiotics are indicated.  No evidence of HSV or VSV infection. Pt is not a contact lens wearer.  Exam non-concerning for orbital cellulitis, hyphema, corneal ulcers, corneal abrasions or trauma.  Patient will be discharged home with naphazoline drops every 1 to 2 hours for pruritus.  Patient has been instructed to use cool compresses and practice personal hygiene with frequent hand washing.  I have also emphasized the importance of close ophthalmology follow-up for continued evaluation and management of her symptoms.  Patient is understanding and amenable with plan, educated on red flag symptoms of prompt immediate return.  Patient discharged in stable condition.     Final Clinical Impression(s) / ED Diagnoses Final diagnoses:  Viral conjunctivitis    Rx / DC Orders ED Discharge Orders           Ordered    naphazoline-pheniramine (NAPHCON-A) 0.025-0.3 % ophthalmic solution  4 times daily PRN        03/13/22 1701          An After Visit Summary was printed and given to the patient.     Bud Face, PA-C 03/13/22 Allen, [place], DO 03/13/22 2320

## 2022-03-17 ENCOUNTER — Telehealth (HOSPITAL_COMMUNITY): Payer: Self-pay

## 2022-03-17 NOTE — Telephone Encounter (Signed)
Spoke with the patient, detailed instructions given. She stated that she would be here for her test. Asked to call back with any questions. S.Marita Burnsed EMTP 

## 2022-03-21 DIAGNOSIS — H04123 Dry eye syndrome of bilateral lacrimal glands: Secondary | ICD-10-CM | POA: Diagnosis not present

## 2022-03-22 ENCOUNTER — Ambulatory Visit (HOSPITAL_BASED_OUTPATIENT_CLINIC_OR_DEPARTMENT_OTHER): Payer: Medicare Other

## 2022-03-22 ENCOUNTER — Ambulatory Visit (HOSPITAL_COMMUNITY): Payer: Medicare Other | Attending: Internal Medicine

## 2022-03-22 DIAGNOSIS — R0600 Dyspnea, unspecified: Secondary | ICD-10-CM | POA: Diagnosis not present

## 2022-03-22 LAB — ECHOCARDIOGRAM COMPLETE
Area-P 1/2: 2.92 cm2
S' Lateral: 2.7 cm

## 2022-03-22 MED ORDER — REGADENOSON 0.4 MG/5ML IV SOLN
0.4000 mg | Freq: Once | INTRAVENOUS | Status: AC
  Administered 2022-03-22: 0.4 mg via INTRAVENOUS

## 2022-03-22 MED ORDER — TECHNETIUM TC 99M TETROFOSMIN IV KIT
31.5000 | PACK | Freq: Once | INTRAVENOUS | Status: AC | PRN
  Administered 2022-03-22: 31.5 via INTRAVENOUS

## 2022-03-22 MED ORDER — TECHNETIUM TC 99M TETROFOSMIN IV KIT
10.6000 | PACK | Freq: Once | INTRAVENOUS | Status: AC | PRN
  Administered 2022-03-22: 10.6 via INTRAVENOUS

## 2022-03-23 LAB — MYOCARDIAL PERFUSION IMAGING
LV dias vol: 83 mL (ref 46–106)
LV sys vol: 29 mL
Nuc Stress EF: 65 %
Peak HR: 103 {beats}/min
Rest HR: 53 {beats}/min
Rest Nuclear Isotope Dose: 10.6 mCi
SDS: 2
SRS: 2
SSS: 4
ST Depression (mm): 0 mm
Stress Nuclear Isotope Dose: 31.5 mCi
TID: 1.03

## 2022-03-24 ENCOUNTER — Telehealth: Payer: Self-pay | Admitting: Cardiology

## 2022-03-24 NOTE — Telephone Encounter (Signed)
Dr. Phineas Inches saw this patient on 03/03/22 for dyspnea. Exercise SPECT and TTE were ordered. Both results came back unremarkable. Will route note to Dr. Phineas Inches for her to weigh in on upcoming procedure.   Finis Bud, NP

## 2022-03-24 NOTE — Telephone Encounter (Signed)
   Pre-operative Risk Assessment    Patient Name: Terry Shannon  DOB: 02/11/1956 MRN: [id number]      Request for Surgical Clearance    Procedure:   Colonoscopy w/Propofol  Date of Surgery:  Clearance 03/30/22                                 Surgeon:  Tory Emerald. Benson Norway, MD Surgeon's Group or Practice Name:  [facility], [place] Phone number:  [phone] Fax number:  [phone]   Type of Clearance Requested:   - Medical  - Pharmacy:  Hold        Type of Anesthesia:  Not Indicated   Additional requests/questions:   Please evaluate the patient's history and advise [place] of any special consideration that should be made.  Signed, Hipolito Bayley   03/24/2022, 1:49 PM

## 2022-03-28 NOTE — Telephone Encounter (Signed)
     Primary Cardiologist: Janina Mayo, MD  Chart reviewed as part of pre-operative protocol coverage. Given past medical history and time since last visit, based on ACC/AHA guidelines, Terry Shannon would be at acceptable risk for the planned procedure without further cardiovascular testing.   Patient's aspirin is prescribed by a noncardiology provider.  Recommendations for holding aspirin will need to come from prescribing provider.  I will route this recommendation to the requesting party via Epic fax function and remove from pre-op pool.  Please call with questions.  Jossie Ng. [name] NP-C     03/28/2022, 10:55 AM [place] [address] Office [phone] Fax [phone]

## 2022-03-30 ENCOUNTER — Encounter: Payer: Self-pay | Admitting: *Deleted

## 2022-03-30 DIAGNOSIS — K219 Gastro-esophageal reflux disease without esophagitis: Secondary | ICD-10-CM | POA: Diagnosis not present

## 2022-03-30 DIAGNOSIS — K2970 Gastritis, unspecified, without bleeding: Secondary | ICD-10-CM | POA: Diagnosis not present

## 2022-03-30 DIAGNOSIS — R1013 Epigastric pain: Secondary | ICD-10-CM | POA: Diagnosis not present

## 2022-03-30 DIAGNOSIS — K5190 Ulcerative colitis, unspecified, without complications: Secondary | ICD-10-CM | POA: Diagnosis not present

## 2022-03-30 DIAGNOSIS — Z1211 Encounter for screening for malignant neoplasm of colon: Secondary | ICD-10-CM | POA: Diagnosis not present

## 2022-04-13 DIAGNOSIS — H04123 Dry eye syndrome of bilateral lacrimal glands: Secondary | ICD-10-CM | POA: Diagnosis not present

## 2022-04-19 DIAGNOSIS — G43711 Chronic migraine without aura, intractable, with status migrainosus: Secondary | ICD-10-CM | POA: Diagnosis not present

## 2022-04-27 ENCOUNTER — Other Ambulatory Visit: Payer: Self-pay | Admitting: Neurology

## 2022-04-28 DIAGNOSIS — M0579 Rheumatoid arthritis with rheumatoid factor of multiple sites without organ or systems involvement: Secondary | ICD-10-CM | POA: Diagnosis not present

## 2022-04-28 DIAGNOSIS — Z111 Encounter for screening for respiratory tuberculosis: Secondary | ICD-10-CM | POA: Diagnosis not present

## 2022-04-28 DIAGNOSIS — H04123 Dry eye syndrome of bilateral lacrimal glands: Secondary | ICD-10-CM | POA: Diagnosis not present

## 2022-04-28 DIAGNOSIS — Z79899 Other long term (current) drug therapy: Secondary | ICD-10-CM | POA: Diagnosis not present

## 2022-05-02 DIAGNOSIS — E890 Postprocedural hypothyroidism: Secondary | ICD-10-CM | POA: Diagnosis not present

## 2022-05-02 DIAGNOSIS — R7309 Other abnormal glucose: Secondary | ICD-10-CM | POA: Diagnosis not present

## 2022-05-02 DIAGNOSIS — M069 Rheumatoid arthritis, unspecified: Secondary | ICD-10-CM | POA: Diagnosis not present

## 2022-05-02 DIAGNOSIS — E782 Mixed hyperlipidemia: Secondary | ICD-10-CM | POA: Diagnosis not present

## 2022-05-02 DIAGNOSIS — E559 Vitamin D deficiency, unspecified: Secondary | ICD-10-CM | POA: Diagnosis not present

## 2022-05-03 ENCOUNTER — Other Ambulatory Visit: Payer: Self-pay | Admitting: Neurology

## 2022-05-04 DIAGNOSIS — H04123 Dry eye syndrome of bilateral lacrimal glands: Secondary | ICD-10-CM | POA: Diagnosis not present

## 2022-05-09 DIAGNOSIS — Z23 Encounter for immunization: Secondary | ICD-10-CM | POA: Diagnosis not present

## 2022-05-12 DIAGNOSIS — G43909 Migraine, unspecified, not intractable, without status migrainosus: Secondary | ICD-10-CM | POA: Diagnosis not present

## 2022-05-12 DIAGNOSIS — E890 Postprocedural hypothyroidism: Secondary | ICD-10-CM | POA: Diagnosis not present

## 2022-05-12 DIAGNOSIS — E2839 Other primary ovarian failure: Secondary | ICD-10-CM | POA: Diagnosis not present

## 2022-05-12 DIAGNOSIS — E782 Mixed hyperlipidemia: Secondary | ICD-10-CM | POA: Diagnosis not present

## 2022-05-12 DIAGNOSIS — M2550 Pain in unspecified joint: Secondary | ICD-10-CM | POA: Diagnosis not present

## 2022-05-12 DIAGNOSIS — Z0001 Encounter for general adult medical examination with abnormal findings: Secondary | ICD-10-CM | POA: Diagnosis not present

## 2022-05-12 DIAGNOSIS — E559 Vitamin D deficiency, unspecified: Secondary | ICD-10-CM | POA: Diagnosis not present

## 2022-05-12 DIAGNOSIS — Z6826 Body mass index (BMI) 26.0-26.9, adult: Secondary | ICD-10-CM | POA: Diagnosis not present

## 2022-05-12 DIAGNOSIS — K5190 Ulcerative colitis, unspecified, without complications: Secondary | ICD-10-CM | POA: Diagnosis not present

## 2022-05-12 DIAGNOSIS — M069 Rheumatoid arthritis, unspecified: Secondary | ICD-10-CM | POA: Diagnosis not present

## 2022-05-12 DIAGNOSIS — K219 Gastro-esophageal reflux disease without esophagitis: Secondary | ICD-10-CM | POA: Diagnosis not present

## 2022-05-12 DIAGNOSIS — N959 Unspecified menopausal and perimenopausal disorder: Secondary | ICD-10-CM | POA: Diagnosis not present

## 2022-05-13 ENCOUNTER — Other Ambulatory Visit: Payer: Self-pay | Admitting: Internal Medicine

## 2022-05-13 DIAGNOSIS — E2839 Other primary ovarian failure: Secondary | ICD-10-CM

## 2022-06-02 ENCOUNTER — Other Ambulatory Visit: Payer: Self-pay | Admitting: Neurology

## 2022-06-04 ENCOUNTER — Other Ambulatory Visit: Payer: Self-pay | Admitting: Neurology

## 2022-06-13 NOTE — Telephone Encounter (Signed)
I called and explain to patient that Dr. Brett Fairy has given her some refills in the past but there has been another physician. We only prescribed this once for her and we do not traditionally prescribe ongoing zolpidem. I explained her primary care, who has been filling this, should give PCP a call for Rx refill. Pt verbalized understanding. Pt had no questions at this time but was encouraged to call back if questions arise.

## 2022-06-22 ENCOUNTER — Other Ambulatory Visit: Payer: Self-pay | Admitting: Internal Medicine

## 2022-06-22 DIAGNOSIS — Z1231 Encounter for screening mammogram for malignant neoplasm of breast: Secondary | ICD-10-CM

## 2022-06-23 DIAGNOSIS — M0579 Rheumatoid arthritis with rheumatoid factor of multiple sites without organ or systems involvement: Secondary | ICD-10-CM | POA: Diagnosis not present

## 2022-07-06 ENCOUNTER — Other Ambulatory Visit: Payer: Self-pay | Admitting: Neurology

## 2022-07-06 DIAGNOSIS — H04123 Dry eye syndrome of bilateral lacrimal glands: Secondary | ICD-10-CM | POA: Diagnosis not present

## 2022-07-07 ENCOUNTER — Telehealth: Payer: Self-pay | Admitting: *Deleted

## 2022-07-07 NOTE — Telephone Encounter (Signed)
Per Dr Jaynee Eagles will not refill Ambien Pt has to go to PCP to get a refill

## 2022-07-11 ENCOUNTER — Ambulatory Visit: Payer: Medicare Other | Attending: Internal Medicine

## 2022-07-11 ENCOUNTER — Other Ambulatory Visit: Payer: Self-pay

## 2022-07-11 DIAGNOSIS — M25562 Pain in left knee: Secondary | ICD-10-CM | POA: Insufficient documentation

## 2022-07-11 DIAGNOSIS — M6281 Muscle weakness (generalized): Secondary | ICD-10-CM | POA: Insufficient documentation

## 2022-07-11 DIAGNOSIS — M5459 Other low back pain: Secondary | ICD-10-CM | POA: Insufficient documentation

## 2022-07-11 DIAGNOSIS — R2689 Other abnormalities of gait and mobility: Secondary | ICD-10-CM | POA: Insufficient documentation

## 2022-07-11 DIAGNOSIS — G8929 Other chronic pain: Secondary | ICD-10-CM | POA: Diagnosis not present

## 2022-07-11 NOTE — Therapy (Signed)
OUTPATIENT PHYSICAL THERAPY THORACOLUMBAR EVALUATION   Patient Name: Terry Shannon MRN: [id number] DOB:1955-07-23, 67 y.o., female Today's Date: 07/11/2022  END OF SESSION:  PT End of Session - 07/11/22 1657     Visit Number 1    Date for PT Re-Evaluation 09/05/22    Authorization Type Medicare    Progress Note Due on Visit 10    PT Start Time 1618    PT Stop Time [id number]    PT Time Calculation (min) 41 min    Activity Tolerance Patient tolerated treatment well    Behavior During Therapy WFL for tasks assessed/performed             Past Medical History:  Diagnosis Date   Allergy    year-round, pt. states   Chronic lower back pain    CMC arthritis, thumb, degenerative [id number]   right   Complication of anesthesia    slow to wake up after gallbladder surgery   Depression    Eczema    ARMS AND HANDS   GERD (gastroesophageal reflux disease)    History of thyroid cancer    Hypothyroidism    Migraines    Osteoarthritis    Overactive bladder    RA (rheumatoid arthritis) ([place])    Sleep apnea    no CPAP use   Squamous cell carcinoma of skin 12/21/2017   in situ-mid chest (CX35FU)   Ulcerative colitis ([place])    Past Surgical History:  Procedure Laterality Date   ABDOMINAL HERNIA REPAIR  123456   periumbilical ventral hernia/incisional hernia   BUNIONECTOMY Right    x 2 more   BUNIONECTOMY Left    BUNIONECTOMY WITH CHILECTOMY Right 12/16/2005   CARPAL TUNNEL RELEASE Right 07/13/2001   CARPAL TUNNEL RELEASE Left 08/10/2001   DILATION AND CURETTAGE OF UTERUS     HAMMER TOE SURGERY Left may 2014   HYSTEROSCOPY WITH D & C  03/01/2004   with exc. of endometrial polyp   KNEE ARTHROSCOPY Right 2013   LAPAROSCOPIC CHOLECYSTECTOMY  11/03/2006   LUMBAR FUSION  03/06/2014   l4  l5    nerve ablation lumbar      THYROIDECTOMY, PARTIAL Right prior to 2002   THYROIDECTOMY, PARTIAL Left 11/29/2000   and isthmus   TONSILLECTOMY  1961   TOTAL ANKLE REPLACEMENT Left 11/2016    with tendon repair   TOTAL KNEE ARTHROPLASTY Right 04/01/2013   Procedure: RIGHT TOTAL KNEE ARTHROPLASTY;  Surgeon: Alta Corning, MD;  Location: [facility];  Service: Orthopedics;  Laterality: Right;   Patient Active Problem List   Diagnosis Date Noted   Asthmatic bronchitis, mild persistent, uncomplicated [id number]   Upper airway cough syndrome 01/27/2022   OSA on CPAP 04/27/2021   Intolerance of continuous positive airway pressure (CPAP) ventilation 06/17/2020   OSA (obstructive sleep apnea) 05/06/2020   Snoring 05/06/2020   Insomnia secondary to chronic pain 03/24/2020   History of sleep apnea 03/24/2020   Gasping for breath 03/24/2020   Abnormal glucose level 12/16/2019   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   Degeneration of lumbar intervertebral disc 12/16/2019   Hyperlipidemia 12/16/2019   Hypothyroidism 12/16/2019   Insomnia 12/16/2019   Migraine 12/16/2019   Overweight 12/16/2019   Postoperative hypothyroidism 12/16/2019   Vitamin D deficiency 12/16/2019   Hallux rigidus, right foot 12/05/2019   Hammer toe of right foot 12/05/2019   History of left ankle joint replacement 12/05/2019   Aftercare following ankle joint replacement surgery 01/10/2017   Anxiety, mild 12/12/2016   Atypical  chest pain 12/12/2016   Cardiac murmur 12/12/2016   GERD (gastroesophageal reflux disease) 12/12/2016   History of thyroid cancer 12/12/2016   Post-traumatic osteoarthritis of left ankle 10/03/2016   Chronic migraine without aura, with intractable migraine, so stated, with status migrainosus 09/26/2015   Joint pain 02/18/2015   Abnormal C-reactive protein 01/08/2015   Radiculopathy 03/06/2014   Osteoarthritis of right knee 04/01/2013   OA (osteoarthritis) 11/01/2011   OAB (overactive bladder)    Ulcerative colitis ([place])    Cancer ([facility])    Carpal tunnel syndrome    RA (rheumatoid arthritis) ([place])     PCP: Willey Blade, MD  REFERRING PROVIDER: Willey Blade,  MD  REFERRING DIAG: M25.50 (ICD-10-CM) - Pain in unspecified joint   Rationale for Evaluation and Treatment: Rehabilitation  THERAPY DIAG:  Muscle weakness (generalized) - Plan: PT plan of care cert/re-cert  Other low back pain - Plan: PT plan of care cert/re-cert  Other abnormalities of gait and mobility - Plan: PT plan of care cert/re-cert  Chronic pain of left knee - Plan: PT plan of care cert/re-cert  ONSET DATE: 6 months ago  SUBJECTIVE:                                                                                                                                                                                           SUBJECTIVE STATEMENT: I have fallen 4 times in the past 6 months.  I need to work on my balance and legs.  I usually just trip over my feet.  I'm not having LBP now.  I want to focus on my balance.   PERTINENT HISTORY:  Lumbar fusion 2015, LBP, Lt total ankle replacement 2018, Rt TKA 2014, migraines, RA  PAIN:  No pain reported today  PRECAUTIONS: None  WEIGHT BEARING RESTRICTIONS: No  FALLS:  Has patient fallen in last 6 months? Yes. Number of falls 4. PT will address balance.   LIVING ENVIRONMENT: Lives with: lives with their family and lives with their partner Lives in: House/apartment Stairs: Yes- uses rail Has following equipment at home: None  OCCUPATION: retired, walks dog  PLOF: Independent  PATIENT GOALS: improve balance, reduce falls risk, improve strength/endurance Walking limited to 30-35 minutes.   NEXT MD VISIT: end of February  OBJECTIVE:   DIAGNOSTIC FINDINGS:  none  COGNITION: Overall cognitive status: Within functional limits for tasks assessed     SENSATION: WFL  MUSCLE LENGTH: Hip flexibility WFLS without pain  POSTURE: forward head  PALPATION: No palpable tenderness  LUMBAR ROM:  WFLs without pain  LOWER EXTREMITY ROM:    WFLs without pain  LOWER EXTREMITY MMT:  Bil hips 4/5, knees 4+/5, ankles  4=/5   FUNCTIONAL TESTS:  5 times sit to stand: 18.79 without UE support-Lt knee pain with this  GAIT: Distance walked: 100 Assistive device utilized: None Level of assistance: Complete Independence Comments: mild scissoring and reduced glute med activation bilaterally Steps: step to gait with use of rail due to Lt knee pain  TODAY'S TREATMENT:                                                                                                                              DATE: 07/11/22  HEP established-see below  PATIENT EDUCATION:  Education details: Access Code: [email or web address] Person educated: Patient Education method: Explanation, Demonstration, and Handouts Education comprehension: verbalized understanding and returned demonstration  HOME EXERCISE PROGRAM: Access Code: [email or web address] URL: [email or web address] Date: 07/11/2022 Prepared by: Claiborne Billings  Exercises - Seated Hamstring Stretch  - 3 x daily - 7 x weekly - 1 sets - 3 reps - 20 hold - Seated Piriformis Stretch with Trunk Bend  - 3 x daily - 7 x weekly - 1 sets - 3 reps - 20 hold - Clamshell  - 2 x daily - 7 x weekly - 1-2 sets - 10 reps - Heel Raises with Counter Support  - 2 x daily - 7 x weekly - 1-2 sets - 5 reps - Seated Heel Raise  - 2 x daily - 7 x weekly - 2 sets - 10 reps  ASSESSMENT:  CLINICAL IMPRESSION: Patient is a 67 y.o. female who was seen today for physical therapy evaluation and treatment for balance deficits and functional strength deficits in addition to 4 falls in the past 6 months.  Pt has complex orthopedic history with lumbar fusion, Rt TKA, significant Lt knee pain and OA and Lt total ankle replacement.  Pt performed 5x sit to stand in 18 seconds with significant Lt knee pain, indicating falls risk.  She demonstrates reduced glute med activation with gait bilaterally. Hip strength is limited bilaterally.  Patient will benefit from skilled PT to address the below impairments and improve overall  function.   OBJECTIVE IMPAIRMENTS: Abnormal gait, decreased activity tolerance, decreased endurance, difficulty walking, decreased ROM, decreased strength, increased muscle spasms, impaired flexibility, improper body mechanics, postural dysfunction, and pain.   ACTIVITY LIMITATIONS: lifting, standing, squatting, stairs, transfers, and locomotion level  PARTICIPATION LIMITATIONS: meal prep, cleaning, community activity, and yard work  PERSONAL FACTORS: Past/current experiences and 3+ comorbidities: lumbar fusion, Lt knee OA, Rt knee TKA, RA  are also affecting patient's functional outcome.   REHAB POTENTIAL: Good  CLINICAL DECISION MAKING: Evolving/moderate complexity  EVALUATION COMPLEXITY: Moderate   GOALS: Goals reviewed with patient? Yes  SHORT TERM GOALS: Target date: 08/08/2022    Be independent in initial HEP Baseline: Goal status: INITIAL  2.  Perform 5x sit to stand in < or = to 14 seconds to reduce falls risk  Baseline:  Goal status: INITIAL  3.  Improve LE strength  and muscle recruitment to perform sit to stand with > = to 40% reduction in Lt knee pain Baseline:  Goal status: INITIAL  4.  Verbalize falls risk and how to modify outdoor activities to reduce falls  Baseline:  Goal status: INITIAL    LONG TERM GOALS: Target date: 09/09/2022    Be independent in advanced HEP Baseline:  Goal status: INITIAL  2.  Perform 5x sit to stand in < or = to 12 seconds to reduce falls risk  Baseline: 18.79  Goal status: INITIAL  3.  Demonstrate 4+/5 bil hip strength to improve endurance and safety for community distances Baseline:  Goal status: INITIAL  4.  Improve LE strength to negotiate steps with step-over step pattern and use of 1 rail Baseline:  Goal status: INITIAL  5.  Report no falls at home Baseline:  Goal status: INITIAL    PLAN:  PT FREQUENCY: 2x/week  PT DURATION: 8 weeks  PLANNED INTERVENTIONS: Therapeutic exercises, Therapeutic activity,  Neuromuscular re-education, Balance training, Gait training, Patient/Family education, Self Care, Joint mobilization, Joint manipulation, Stair training, Aquatic Therapy, Dry Needling, Electrical stimulation, Spinal manipulation, Spinal mobilization, Cryotherapy, Moist heat, Taping, Vasopneumatic device, Manual therapy, and Re-evaluation.  PLAN FOR NEXT SESSION: review HEP, work on balance and incorporate core strength, sit to stand from elevated surface.   Sigurd Sos, PT 07/11/22 5:11 PM   [facility] [address], [place], [place] [address] Phone # [phone] Fax [phone]

## 2022-07-14 DIAGNOSIS — M2550 Pain in unspecified joint: Secondary | ICD-10-CM | POA: Diagnosis not present

## 2022-07-14 DIAGNOSIS — K219 Gastro-esophageal reflux disease without esophagitis: Secondary | ICD-10-CM | POA: Diagnosis not present

## 2022-07-18 ENCOUNTER — Other Ambulatory Visit: Payer: Medicare Other

## 2022-07-18 ENCOUNTER — Ambulatory Visit (INDEPENDENT_AMBULATORY_CARE_PROVIDER_SITE_OTHER): Payer: Medicare Other | Admitting: Neurology

## 2022-07-18 ENCOUNTER — Encounter: Payer: Self-pay | Admitting: Neurology

## 2022-07-18 VITALS — BP 143/83 | HR 52 | Ht 66.0 in | Wt 172.2 lb

## 2022-07-18 DIAGNOSIS — G4733 Obstructive sleep apnea (adult) (pediatric): Secondary | ICD-10-CM | POA: Diagnosis not present

## 2022-07-18 DIAGNOSIS — G43711 Chronic migraine without aura, intractable, with status migrainosus: Secondary | ICD-10-CM | POA: Diagnosis not present

## 2022-07-18 NOTE — Patient Instructions (Signed)
Continue vyepti Keep journal march and April Canconsider increasing '300mg'$   Eptinezumab Injection What is this medication? EPTINEZUMAB (EP ti NEZ ue mab) prevents migraines. It works by blocking a substance in the body that causes migraines. It is a monoclonal antibody. This medicine may be used for other purposes; ask your health care provider or pharmacist if you have questions. COMMON BRAND NAME(S): Vyepti What should I tell my care team before I take this medication? They need to know if you have any of these conditions: An unusual or allergic reaction to eptinezumab, other medications, foods, dyes, or preservatives Pregnant or trying to get pregnant Breast-feeding How should I use this medication? This medication is injected into a vein. It is given by your care team in a hospital or clinic setting. Talk to your care team about the use of this medication in children. Special care may be needed. Overdosage: If you think you have taken too much of this medicine contact a poison control center or emergency room at once. NOTE: This medicine is only for you. Do not share this medicine with others. What if I miss a dose? Keep appointments for follow-up doses. It is important not to miss your dose. Call your care team if you are unable to keep an appointment. What may interact with this medication? Interactions are not expected. This list may not describe all possible interactions. Give your health care provider a list of all the medicines, herbs, non-prescription drugs, or dietary supplements you use. Also tell them if you smoke, drink alcohol, or use illegal drugs. Some items may interact with your medicine. What should I watch for while using this medication? Your condition will be monitored carefully while you are receiving this medication. What side effects may I notice from receiving this medication? Side effects that you should report to your care team as soon as possible: Allergic  reactions or angioedema--skin rash, itching or hives, swelling of the face, eyes, lips, tongue, arms, or legs, trouble swallowing or breathing Side effects that usually do not require medical attention (report to your care team if they continue or are bothersome): Runny or stuffy nose This list may not describe all possible side effects. Call your doctor for medical advice about side effects. You may report side effects to FDA at 1-800-FDA-1088. Where should I keep my medication? This medication is given in a hospital or clinic. It will not be stored at home. NOTE: This sheet is a summary. It may not cover all possible information. If you have questions about this medicine, talk to your doctor, pharmacist, or health care provider.  2023 Elsevier/Gold Standard (2021-07-05 00:00:00)

## 2022-07-18 NOTE — Progress Notes (Signed)
[id number]    Provider:  Dr Jaynee Eagles Referring Provider: Willey Blade, MD Primary Care Physician:  Willey Blade, MD    CC:  Migraine  07/18/2022: Vyepti seems to be making a difference. She has only had one headache in the last month. Vyepti seems like it is helping her tremendously. Start tracking headaches. After the next vyepti keep track of how many migraines days a month, appears not even using the imitrex. Next one is feb 28th. Still on 100, if having headaches increase to 300.   Patient complains of symptoms per HPI as well as the following symptoms: covid . Pertinent negatives and positives per HPI. All others negative   Internal history 12/15/2021: here for follow up. Hard to use the cpap but she can only sleep with it for more than 4 hours a night but she is trying hard. The propranolol before exercising helps. Botox didn't seem to help and she still has migraines, at least 10 migraine days a month and at least 15 headache days a month. Had a discussion on options, will try Vyepti.  She is tried and failed multiple medications including:Topamax, amitriptyline, gabapentin, Flexeril, propranolol, Pristiq, Reglan, tylenol, nsaids, asa, and she has tried a lot that she can't remember and was on these for years, stopped working.Zonegran, botox, ajovy, emgality, aimovig contraindicated due to constipation, gabapentin, flexeril ,zonisamide, gabapentin.  Patient complains of symptoms per HPI as well as the following symptoms: migraines . Pertinent negatives and positives per HPI. All others negative  Interval history 12/15/2020: This is a 67 year old female with chronic migraines, she has failed multiple medications, she also has exercise-induced headaches, she is on propranolol, zonisamide, we sent  her dry needling, she is not using her CPAP and I did discuss that with her - see below.  She takes Imitrex and Reglan at onset of headache.  She is tried and failed  multiple medications:Topamax, amitriptyline, gabapentin, Flexeril, propranolol, Pristiq, Reglan, tylenol, nsaids, asa, and she has tried a lot that she can't remember and was on these for years, stopped working.Zonegran .  She was doing well on propranolol and zonogram and we started Botox. And she stopped that Baseline is daily migraines and headaches.We also tried emgality but she stopped that. Dry needling helped a lot. Still on propranolol, gabapentin, flexeril and stopped the zonisamide and the emgality but migraines seem to be under control.     Migraines, had several botox treatments, botox did not really help but she feels her migraines are overll better a few a month and taking acute management sumatriptan.medication but of course hers was more exertional and she hasn't been exerting herself due to neck and back pain.    Fatigue and prior diagnosis of OSA: Was referred back to sleep studies Dr. Brett Fairy and she was seen and evaluated 03/24/2020 and restested Moderately Severe Obstructive Sleep Apnea (OSA) with an AHI of 28.9/h and with strong REM and supine position dependency.REM AHI was 69.5 /hour, supine AHI was 58.1/h. Working on getting a cpap or inspire.           Chronic neck and low back pains:  , she has chronic neck pain and we sent her to physical therapy and dry needling, she has had it for many years, last MRI in 01/2020 (personally reviewed images) showed multilevel spondylosis and degenerative changes, she has tried other conservative measures,  and pursued interventional treatment such as epidural steroid injections, medial branch blocks, or other possibly surgical.Dry needling helped and the exercises  helped. Repeated MRI cervical spine and then send to Dr. Mina Marble for interventional procedures for her severe neck pain  and is feeling better. She also has low back pain and is having PT and getting injections there.   MRI cervical spine  01/2020:  IMPRESSION:   MRI cervical spine  (without) demonstrating: - Degenerative spondylosis and disc bulging from C4-5 to T2-3. No spinal stenosis or foraminal narrowing. - Mild chronic small vessel ischemic disease in the pons.   Interval history 07/01/19: 67 year old with a past medical history sleep apnea, rheumatoid arthritis, osteoarthritis, chronic migraines for over 2 decades (when I initially saw her quality, frequency, severity and triggers had not significantly changed in many years), hypothyroidism, depression, chronic low back pain. this is a patient I been seeing for several years, chronic migraines and exertional headaches, she has been doing extremely well on propranolol and zonisamide, her blood pressure has been a little elevated, I last visit we increase propranolol to help with headaches as well as blood pressure.  She is also on zonisamide for her migraines.  Reglan and Imitrex as tolerated for acute management.  Today she is not doing as well. She reports her neck is worsening, she can walk her dog around the block and she has a migraine, any movement with her upper body can cause a migraine, she had a nerve ablation in the low back last year and she is going to see Dr. Jacelyn Grip.  She is having daily migraines and headaches, migraines can be moderately severe to severe, they can last upwards of 24 hours, no aura, no medication overuse, ongoing for the last year at the severity and frequency, worsening exertional quality, she can get a headache even folding close.  This is concerning we need imaging.Will start botox.  Tried: Topamax, amitriptyline, gabapentin, Flexeril, propranolol, Pristiq, Reglan, tylenol, nsaids, asa, and she has tried a lot that she can't remember and was on these for years, stopped working.Taking Zonegran now.  Interval history 10/11/2018: Patient continues to do well.  She still has exertional headaches when she walks and her blood pressure runs "a little on the high side".  We discussed increasing the  propranolol to 80 mg as this is a very good headache/migraine medication, and can help with her slightly elevated high blood pressure.  We also discussed she takes the benzodiazepine very rarely, before procedures, sometimes for anxiety, advised her to continue to take it very sparingly and situationally, discussed addiction and overuse and risks of respiratory depression.  Also discussed side effects of propranolol.  We will continue her zonisamide for her migraines.  We will continue Reglan and Imitrex as tolerated for acute management. The headaches start in the back of her neck, it gets tight then spreads to the left side of her head. It is poinding, throbbing, pain behind the eye temple pounding, +light sensitivity, +sound sensitivity, + nausea, no vomiting in over 10 years. Another trigger is exercise. When she exercise she will get a migraine that day.  10/04/2017: She is doing exceptionally well. She tolerates the medication. She is very thankful. Discussed her current regimen, the new medications anti-CGRP meds should she every need them, also botox for migraine and other preventative and acute management but she very happy as is.  Discussed that she now gets headaches and moigraines after her ESI or LBP, unclear if it is the anxiety of the shot, discussed options will give xanax prn   10/04/2016: Patient is here for chronic migraines. She is improved  with regard to the migraines. She still has exertional headaches. Her blood pressure is slightly elevated. Discussed increasing propranolol for exertional headaches and may also help with hypertension she should be monitoring her blood pressure daily. Discussed exertional headaches. Discussed hypertension and following up with her primary care for management. She is doing well on Zonisamide. We'll continue 100 mg in the evenings. Discussed headache triggers, environmental triggers, stress triggers and lifestyle.   HPI:  Leidy Bodin is a 67 y.o. female  here as a second opinion from my colleague Dr. Brett Fairy  for migraines. She has had the migraines since the age of 75. Certain foods and drinks are triggers and she has taken those out of her diet, like wine and aspertame. The headaches start in the back of her neck, it gets tight then spreads to the left side of her head. It is poinding, throbbing, pain behind the eye temple pounding, +light sensitivity, +sound sensitivity, + nausea, no vomiting in over 10 years. Another trigger is exercise. When she exercise she will get a migraine that day. No medicaton overuse headache. Has been going on for years at this frequency. She has 16 migraines a month, they can last for 1-3 days, on average they are 8/10 in severity. They started years ago after a myelogram. No FHx of migraines. She was recently started on Zonegran by Dr. Roddie Mc which she feels may be helping. Reports that she has had normal brain imaging in the past.   Tried: Topamax, amitriptyline, propranolol and she has tried a lot that she can't remember and was on these for years, stopped working.Taking Zonegran now. She uses flexeril and an aspirin now on onset which seems to help a little. Imitrex used to help but stopped helping. She tried zomig and mazalt and relpax.  Has contraindication to NSAIDs but still takes aspirin as this is the only thing that appears to help acutely.    Reviewed notes, labs and imaging from outside physicians, which showed: Personally reviewed images and agree with the following   Vertebral Arteries: BILATERAL patent, LEFT dominant.   Paraspinal tissues: Paravertebral anterior soft tissues are obscured by saturation bands. No visible adenopathy.   Disc levels:   The individual disc spaces were examined as follows:   C2-3: Normal disc space. Trace anterolisthesis. Severe left-sided facet arthropathy. No foraminal narrowing.   C3-4: Moderately severe disc space narrowing. Fusion across the facet joints. No  impingement.   C4-5: Central disc osteophyte complex. Trace anterolisthesis. No impingement.   C5-6: Moderate disc space narrowing. Asymmetric foraminal narrowing on the RIGHT due to uncinate spurring. RIGHT C6 nerve root impingement not excluded.   C6-7: Central disc osteophyte complex. No stenosis or foraminal narrowing.   C7-T1: 3 mm anterolisthesis is facet mediated. Advanced facet arthropathy without impingement.   IMPRESSION: Multilevel spondylosis as described. No significant left-sided foraminal narrowing or leftward disc protrusion.   Asymmetric facet arthropathy on the LEFT is most notable at C2-3. Correlate clinically for facet mediated upper extremity symptoms.   Review of Systems: Patient complains of symptoms per HPI as well as the following symptoms: neck and back pain . Pertinent negatives and positives per HPI. All others negative    Social History   Socioeconomic History   Marital status: Significant Other    Spouse name: Not on file   Number of children: 0   Years of education: Not on file   Highest education level: Bachelor's degree (e.g., BA, AB, BS)  Occupational History   Not  on file  Tobacco Use   Smoking status: Never   Smokeless tobacco: Never  Vaping Use   Vaping Use: Never used  Substance and Sexual Activity   Alcohol use: Not Currently    Comment: occ   Drug use: No   Sexual activity: Not Currently    Birth control/protection: None  Other Topics Concern   Not on file  Social History Narrative   Lives at home with her partner and two daughters   Right handed   Caffeine; 1 cup/daily.     Social Determinants of Health   Financial Resource Strain: Not on file  Food Insecurity: Not on file  Transportation Needs: Not on file  Physical Activity: Not on file  Stress: Not on file  Social Connections: Not on file  Intimate Partner Violence: Not on file    Family History  Problem Relation Age of Onset   Stroke Mother    Asthma Mother     Cancer Father 16       lung   Diabetes Brother    Barrett's esophagus Brother    Cancer Maternal Grandmother 41       colon   Stroke Maternal Grandmother    Stroke Maternal Grandfather    Diabetes Paternal Grandmother    Migraines Neg Hx     Past Medical History:  Diagnosis Date   Allergy    year-round, pt. states   Chronic lower back pain    CMC arthritis, thumb, degenerative [id number]   right   Complication of anesthesia    slow to wake up after gallbladder surgery   Depression    Eczema    ARMS AND HANDS   GERD (gastroesophageal reflux disease)    History of thyroid cancer    Hypothyroidism    Migraines    Osteoarthritis    Overactive bladder    RA (rheumatoid arthritis) ([place])    Sleep apnea    no CPAP use   Squamous cell carcinoma of skin 12/21/2017   in situ-mid chest (CX35FU)   Ulcerative colitis ([place])     Past Surgical History:  Procedure Laterality Date   ABDOMINAL HERNIA REPAIR  123456   periumbilical ventral hernia/incisional hernia   BUNIONECTOMY Right    x 2 more   BUNIONECTOMY Left    BUNIONECTOMY WITH CHILECTOMY Right 12/16/2005   CARPAL TUNNEL RELEASE Right 07/13/2001   CARPAL TUNNEL RELEASE Left 08/10/2001   DILATION AND CURETTAGE OF UTERUS     HAMMER TOE SURGERY Left may 2014   HYSTEROSCOPY WITH D & C  03/01/2004   with exc. of endometrial polyp   KNEE ARTHROSCOPY Right 2013   LAPAROSCOPIC CHOLECYSTECTOMY  11/03/2006   LUMBAR FUSION  03/06/2014   l4  l5    nerve ablation lumbar      THYROIDECTOMY, PARTIAL Right prior to 2002   THYROIDECTOMY, PARTIAL Left 11/29/2000   and isthmus   TONSILLECTOMY  1961   TOTAL ANKLE REPLACEMENT Left 11/2016   with tendon repair   TOTAL KNEE ARTHROPLASTY Right 04/01/2013   Procedure: RIGHT TOTAL KNEE ARTHROPLASTY;  Surgeon: Alta Corning, MD;  Location: [facility];  Service: Orthopedics;  Laterality: Right;    Current Outpatient Medications  Medication Sig Dispense Refill   ALPRAZolam (XANAX) 0.5 MG tablet  Take only before procedures (injections into the neck and low back) up to 3x a day 30 tablet 0   Ascorbic Acid (VITAMIN C PO) Take 6,000 mg by mouth daily.     aspirin  EC 325 MG tablet Take 325 mg by mouth 2 (two) times daily as needed for mild pain.      B Complex-C (SUPER B COMPLEX PO) Take 1 tablet by mouth daily.     Cholecalciferol (VITAMIN D3 PO) Take 1 tablet by mouth daily.      cyclobenzaprine (FLEXERIL) 10 MG tablet Take 10 mg by mouth 3 (three) times daily as needed for muscle spasms.     desvenlafaxine (PRISTIQ) 100 MG 24 hr tablet Take 100 mg by mouth daily.      Flax OIL Take by mouth daily.     gabapentin (NEURONTIN) 300 MG capsule Take 300 mg by mouth 4 (four) times daily.      golimumab (SIMPONI ARIA) 50 MG/4ML SOLN injection See admin instructions.     ipratropium (ATROVENT) 0.02 % nebulizer solution Take 2.5 mLs (0.5 mg total) by nebulization 4 (four) times daily. 75 mL 12   ipratropium (ATROVENT) 0.03 % nasal spray Place 2 sprays into both nostrils every 12 (twelve) hours. 30 mL 12   levothyroxine (SYNTHROID) 50 MCG tablet Take 50 mcg by mouth daily before breakfast.     mesalamine (LIALDA) 1.2 G EC tablet Take 2.4 g by mouth 2 (two) times daily.      MIEBO 1.338 GM/ML SOLN      naphazoline-pheniramine (NAPHCON-A) 0.025-0.3 % ophthalmic solution Place 1 drop into both eyes 4 (four) times daily as needed for eye irritation. 15 mL 0   oxybutynin (DITROPAN-XL) 10 MG 24 hr tablet Take 10 mg by mouth daily.     pantoprazole (PROTONIX) 40 MG tablet Take 40 mg by mouth daily.     Probiotic Product (PROBIOTIC DAILY PO) Take 1 tablet by mouth daily.      propranolol (INDERAL) 10 MG tablet Take 30-60 minutes prior to exercise for exercise-induced headaches. Max twice daily 90 tablet 1   propranolol ER (INDERAL LA) 80 MG 24 hr capsule Take 1 capsule (80 mg total) by mouth at bedtime. For migraine prevention and high blood pressure. 90 capsule 4   RESTASIS MULTIDOSE 0.05 % ophthalmic  emulsion Place 1 drop into both eyes 2 (two) times daily.     Rosuvastatin Calcium (CRESTOR PO) Take 20 mg by mouth daily.      sucralfate (CARAFATE) 1 g tablet Take 1 g by mouth 4 (four) times daily.     SUMAtriptan (IMITREX) 100 MG tablet TAKE 1 TABLET BY MOUTH ONCE DAILY AS NEEDED. MAY REPEAT IN 2 HOURS IF HEADACHE PERSISTS OR RECURS. 10 tablet 11   thyroid (ARMOUR) 60 MG tablet Take 60 mg by mouth daily before breakfast.     zolpidem (AMBIEN CR) 12.5 MG CR tablet TAKE ONE TABLET BY MOUTH DAILY AT BEDTIME 30 tablet 0   zonisamide (ZONEGRAN) 100 MG capsule Take 1 capsule (100 mg total) by mouth daily. For migraine prevention. 90 capsule 3   No current facility-administered medications for this visit.    Allergies as of 07/18/2022 - Review Complete 07/18/2022  Allergen Reaction Noted   Codeine Nausea And Vomiting 09/09/2010   Hydrocodone Nausea And Vomiting 08/08/2012   Ibuprofen Other (See Comments) 08/08/2012   Iodinated contrast media Hives and Itching 09/16/2010   Methotrexate derivatives Other (See Comments) 08/08/2012   Oxycodone Nausea And Vomiting 04/01/2013   Tylenol [acetaminophen] Other (See Comments) 08/08/2012   Aleve [naproxen sodium]     Sulfa antibiotics  11/01/2011   Neosporin [neomycin-bacitracin zn-polymyx] Swelling 07/18/2013    Vitals: BP (!) 143/83  Pulse (!) 52   Ht '5\' 6"'$  (1.676 m)   Wt 172 lb 3.2 oz (78.1 kg)   BMI 27.79 kg/m  Last Weight:  Wt Readings from Last 1 Encounters:  07/18/22 172 lb 3.2 oz (78.1 kg)   Last Height:   Ht Readings from Last 1 Encounters:  07/18/22 '5\' 6"'$  (1.676 m)  Exam: NAD, pleasant                  Speech:    Speech is normal; fluent and spontaneous with normal comprehension.  Cognition:    The patient is oriented to person, place, and time;     recent and remote memory intact;     language fluent;    Cranial Nerves:    The pupils are equal, round, and reactive to light.Trigeminal sensation is intact and the muscles  of mastication are normal. The face is symmetric. The palate elevates in the midline. Hearing intact. Voice is normal. Shoulder shrug is normal. The tongue has normal motion without fasciculations.   Coordination:  No dysmetria  Motor Observation:    No asymmetry, no atrophy, and no involuntary movements noted. Tone:    Normal muscle tone.     Strength:    Strength is V/V in the upper and lower limbs.      Sensation: intact to LT         Assessment/Plan:  This is a 67 year old female with chronic migraines, she has failed multiple medications, she also has exercise-induced headaches, she is on propranolol, zonisamide, we sent  her dry needling,  She takes Imitrex and Reglan at onset of headache.  Botox didn't seem to help and she still has migraines, see full list below, all the cgrp injections as well,  at least 10 moderate to severe migraine days a month and at least 15 headache days a month for years. Had a discussion on options, now on Vyepti.  Migraines: Vyepti seems to be making a difference. She has only had one headache in the last month. Vyepti seems like it is helping her tremendously. Start tracking headaches. After the next vyepti 2/28 keep track of how many migraines days a month in march and April, appears not even using the imitrex. Next one is feb 28th. Still on 100, if still having headaches increase to 300 vyepti. She will keep a journal in march and April.   She is tried and failed multiple medications including:Topamax, amitriptyline, gabapentin, Flexeril, propranolol, Pristiq, Reglan, tylenol, nsaids, asa, Zonegran, botox, ajovy, emgality, aimovig contraindicated due to constipation, gabapentin, flexeril ,zonisamide, gabapentin.depakote. imitrex. Maxalt.   Exertional headaches: propranolol '10mg'$  30-60 mins prior pulse on the low side watch for shortness of breath, lightheadedness, seeing a pulmonologist and cardiologist  Xanax: ONLY prior to ESI/cervical lumbar  injections  Fatigue and prior diagnosis of OSA: Was referred back to sleep studies Dr. Brett Fairy and she was seen and evaluated 03/24/2020 and restested Moderately Severe Obstructive Sleep Apnea (OSA) with an AHI of 28.9/h and with strong REM and supine position dependency.REM AHI was 69.5 /hour, supine AHI was 58.1/h. Hard to use the cpap but she can only sleep with it for more than 4 hours a night but she is trying hard. The propranolol before exercising helps.    Chronic neck and low back pains:  , she has chronic neck pain and we sent her to physical therapy and dry needling, she has had it for many years, PT and getting injections. Try [email or web address] Edwena Felty   Discussed  again: To prevent or relieve headaches, try the following: Cool Compress. Lie down and place a cool compress on your head.  Avoid headache triggers. If certain foods or odors seem to have triggered your migraines in the past, avoid them. A headache diary might help you identify triggers.  Include physical activity in your daily routine. Try a daily walk or other moderate aerobic exercise.  Manage stress. Find healthy ways to cope with the stressors, such as delegating tasks on your to-do list.  Practice relaxation techniques. Try deep breathing, yoga, massage and visualization.  Eat regularly. Eating regularly scheduled meals and maintaining a healthy diet might help prevent headaches. Also, drink plenty of fluids.  Follow a regular sleep schedule. Sleep deprivation might contribute to headaches Consider biofeedback. With this mind-body technique, you learn to control certain bodily functions -- such as muscle tension, heart rate and blood pressure -- to prevent headaches or reduce headache pain.    Proceed to emergency room if you experience new or worsening symptoms or symptoms do not resolve, if you have new neurologic symptoms or if headache is severe, or for any concerning symptom.   Provided education and documentation from  American headache Society toolbox including articles on: chronic migraine medication overuse headache, chronic migraines, prevention of migraines, behavioral and other nonpharmacologic treatments for headache.    Sarina Ill, MD  [facility] [address] [place], [place] [address]  Phone [phone] Fax [phone]  I spent over 20 minutes of face-to-face and non-face-to-face time with patient on the  1. Chronic migraine without aura, with intractable migraine, so stated, with status migrainosus   2. OSA (obstructive sleep apnea)    diagnosis.  This included previsit chart review, lab review, study review, order entry, electronic health record documentation, patient education on the different diagnostic and therapeutic options, counseling and coordination of care, risks and benefits of management, compliance, or risk factor reduction

## 2022-07-20 NOTE — Therapy (Deleted)
OUTPATIENT PHYSICAL THERAPY THORACOLUMBAR EVALUATION   Patient Name: Terry Shannon MRN: [id number] DOB:[birth year], 67 y.o., female Today's Date: 07/20/2022  END OF SESSION:    Past Medical History:  Diagnosis Date   Allergy    year-round, pt. states   Chronic lower back pain    CMC arthritis, thumb, degenerative [id number]   right   Complication of anesthesia    slow to wake up after gallbladder surgery   Depression    Eczema    ARMS AND HANDS   GERD (gastroesophageal reflux disease)    History of thyroid cancer    Hypothyroidism    Migraines    Osteoarthritis    Overactive bladder    RA (rheumatoid arthritis) ([place])    Sleep apnea    no CPAP use   Squamous cell carcinoma of skin 12/21/2017   in situ-mid chest (CX35FU)   Ulcerative colitis [facility])    Past Surgical History:  Procedure Laterality Date   ABDOMINAL HERNIA REPAIR  123456   periumbilical ventral hernia/incisional hernia   BUNIONECTOMY Right    x 2 more   BUNIONECTOMY Left    BUNIONECTOMY WITH CHILECTOMY Right 12/16/2005   CARPAL TUNNEL RELEASE Right 07/13/2001   CARPAL TUNNEL RELEASE Left 08/10/2001   DILATION AND CURETTAGE OF UTERUS     HAMMER TOE SURGERY Left may 2014   HYSTEROSCOPY WITH D & C  03/01/2004   with exc. of endometrial polyp   KNEE ARTHROSCOPY Right 2013   LAPAROSCOPIC CHOLECYSTECTOMY  11/03/2006   LUMBAR FUSION  03/06/2014   l4  l5    nerve ablation lumbar      THYROIDECTOMY, PARTIAL Right prior to 2002   THYROIDECTOMY, PARTIAL Left 11/29/2000   and isthmus   TONSILLECTOMY  1961   TOTAL ANKLE REPLACEMENT Left 11/2016   with tendon repair   TOTAL KNEE ARTHROPLASTY Right 04/01/2013   Procedure: RIGHT TOTAL KNEE ARTHROPLASTY;  Surgeon: Alta Corning, MD;  Location: [place];  Service: Orthopedics;  Laterality: Right;   Patient Active Problem List   Diagnosis Date Noted   Asthmatic bronchitis, mild persistent, uncomplicated [id number]   Upper airway cough syndrome 01/27/2022   OSA on  CPAP 04/27/2021   Intolerance of continuous positive airway pressure (CPAP) ventilation 06/17/2020   OSA (obstructive sleep apnea) 05/06/2020   Snoring 05/06/2020   Insomnia secondary to chronic pain 03/24/2020   History of sleep apnea 03/24/2020   Gasping for breath 03/24/2020   Abnormal glucose level 12/16/2019   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   Degeneration of lumbar intervertebral disc 12/16/2019   Hyperlipidemia 12/16/2019   Hypothyroidism 12/16/2019   Insomnia 12/16/2019   Migraine 12/16/2019   Overweight 12/16/2019   Postoperative hypothyroidism 12/16/2019   Vitamin D deficiency 12/16/2019   Hallux rigidus, right foot 12/05/2019   Hammer toe of right foot 12/05/2019   History of left ankle joint replacement 12/05/2019   Aftercare following ankle joint replacement surgery 01/10/2017   Anxiety, mild 12/12/2016   Atypical chest pain 12/12/2016   Cardiac murmur 12/12/2016   GERD (gastroesophageal reflux disease) 12/12/2016   History of thyroid cancer 12/12/2016   Post-traumatic osteoarthritis of left ankle 10/03/2016   Chronic migraine without aura, with intractable migraine, so stated, with status migrainosus 09/26/2015   Joint pain 02/18/2015   Abnormal C-reactive protein 01/08/2015   Radiculopathy 03/06/2014   Osteoarthritis of right knee 04/01/2013   OA (osteoarthritis) 11/01/2011   OAB (overactive bladder)    Ulcerative colitis ([place])    Cancer ([place])  Carpal tunnel syndrome    RA (rheumatoid arthritis) ([facility])     PCP: Willey Blade, MD  REFERRING PROVIDER: Willey Blade, MD  REFERRING DIAG: M25.50 (ICD-10-CM) - Pain in unspecified joint   Rationale for Evaluation and Treatment: Rehabilitation  THERAPY DIAG:  No diagnosis found.  ONSET DATE: 6 months ago  SUBJECTIVE:                                                                                                                                                                                            SUBJECTIVE STATEMENT: I have fallen 4 times in the past 6 months.  I need to work on my balance and legs.  I usually just trip over my feet.  I'm not having LBP now.  I want to focus on my balance.   PERTINENT HISTORY:  Lumbar fusion 2015, LBP, Lt total ankle replacement 2018, Rt TKA 2014, migraines, RA  PAIN:  No pain reported today  PRECAUTIONS: None  WEIGHT BEARING RESTRICTIONS: No  FALLS:  Has patient fallen in last 6 months? Yes. Number of falls 4. PT will address balance.   LIVING ENVIRONMENT: Lives with: lives with their family and lives with their partner Lives in: House/apartment Stairs: Yes- uses rail Has following equipment at home: None  OCCUPATION: retired, walks dog  PLOF: Independent  PATIENT GOALS: improve balance, reduce falls risk, improve strength/endurance Walking limited to 30-35 minutes.   NEXT MD VISIT: end of February  OBJECTIVE:   DIAGNOSTIC FINDINGS:  none  COGNITION: Overall cognitive status: Within functional limits for tasks assessed     SENSATION: WFL  MUSCLE LENGTH: Hip flexibility WFLS without pain  POSTURE: forward head  PALPATION: No palpable tenderness  LUMBAR ROM:  WFLs without pain  LOWER EXTREMITY ROM:    WFLs without pain  LOWER EXTREMITY MMT:    Bil hips 4/5, knees 4+/5, ankles 4=/5   FUNCTIONAL TESTS:  5 times sit to stand: 18.79 without UE support-Lt knee pain with this  GAIT: Distance walked: 100 Assistive device utilized: None Level of assistance: Complete Independence Comments: mild scissoring and reduced glute med activation bilaterally Steps: step to gait with use of rail due to Lt knee pain  TODAY'S TREATMENT:  DATE: 07/11/22  HEP established-see below  PATIENT EDUCATION:  Education details: Access Code: [email or web address] Person educated: Patient Education method:  Explanation, Demonstration, and Handouts Education comprehension: verbalized understanding and returned demonstration  HOME EXERCISE PROGRAM: Access Code: [email or web address] URL: [email or web address] Date: 07/11/2022 Prepared by: Claiborne Billings  Exercises - Seated Hamstring Stretch  - 3 x daily - 7 x weekly - 1 sets - 3 reps - 20 hold - Seated Piriformis Stretch with Trunk Bend  - 3 x daily - 7 x weekly - 1 sets - 3 reps - 20 hold - Clamshell  - 2 x daily - 7 x weekly - 1-2 sets - 10 reps - Heel Raises with Counter Support  - 2 x daily - 7 x weekly - 1-2 sets - 5 reps - Seated Heel Raise  - 2 x daily - 7 x weekly - 2 sets - 10 reps  ASSESSMENT:  CLINICAL IMPRESSION: Patient is a 67 y.o. female who was seen today for physical therapy evaluation and treatment for balance deficits and functional strength deficits in addition to 4 falls in the past 6 months.  Pt has complex orthopedic history with lumbar fusion, Rt TKA, significant Lt knee pain and OA and Lt total ankle replacement.  Pt performed 5x sit to stand in 18 seconds with significant Lt knee pain, indicating falls risk.  She demonstrates reduced glute med activation with gait bilaterally. Hip strength is limited bilaterally.  Patient will benefit from skilled PT to address the below impairments and improve overall function.   OBJECTIVE IMPAIRMENTS: Abnormal gait, decreased activity tolerance, decreased endurance, difficulty walking, decreased ROM, decreased strength, increased muscle spasms, impaired flexibility, improper body mechanics, postural dysfunction, and pain.   ACTIVITY LIMITATIONS: lifting, standing, squatting, stairs, transfers, and locomotion level  PARTICIPATION LIMITATIONS: meal prep, cleaning, community activity, and yard work  PERSONAL FACTORS: Past/current experiences and 3+ comorbidities: lumbar fusion, Lt knee OA, Rt knee TKA, RA  are also affecting patient's functional outcome.   REHAB POTENTIAL: Good  CLINICAL  DECISION MAKING: Evolving/moderate complexity  EVALUATION COMPLEXITY: Moderate   GOALS: Goals reviewed with patient? Yes  SHORT TERM GOALS: Target date: 08/08/2022    Be independent in initial HEP Baseline: Goal status: INITIAL  2.  Perform 5x sit to stand in < or = to 14 seconds to reduce falls risk  Baseline:  Goal status: INITIAL  3.  Improve LE strength and muscle recruitment to perform sit to stand with > = to 40% reduction in Lt knee pain Baseline:  Goal status: INITIAL  4.  Verbalize falls risk and how to modify outdoor activities to reduce falls  Baseline:  Goal status: INITIAL    LONG TERM GOALS: Target date: 09/09/2022    Be independent in advanced HEP Baseline:  Goal status: INITIAL  2.  Perform 5x sit to stand in < or = to 12 seconds to reduce falls risk  Baseline: 18.79  Goal status: INITIAL  3.  Demonstrate 4+/5 bil hip strength to improve endurance and safety for community distances Baseline:  Goal status: INITIAL  4.  Improve LE strength to negotiate steps with step-over step pattern and use of 1 rail Baseline:  Goal status: INITIAL  5.  Report no falls at home Baseline:  Goal status: INITIAL    PLAN:  PT FREQUENCY: 2x/week  PT DURATION: 8 weeks  PLANNED INTERVENTIONS: Therapeutic exercises, Therapeutic activity, Neuromuscular re-education, Balance training, Gait training, Patient/Family education, Self Care, Joint mobilization, Joint manipulation, Stair training, Aquatic Therapy, Dry Needling,  Electrical stimulation, Spinal manipulation, Spinal mobilization, Cryotherapy, Moist heat, Taping, Vasopneumatic device, Manual therapy, and Re-evaluation.  PLAN FOR NEXT SESSION: review HEP, work on balance and incorporate core strength, sit to stand from elevated surface.   Rudi Heap PT, DPT 07/20/22  1:33 PM

## 2022-07-21 ENCOUNTER — Ambulatory Visit: Payer: Medicare Other | Admitting: Physical Therapy

## 2022-07-27 DIAGNOSIS — G43711 Chronic migraine without aura, intractable, with status migrainosus: Secondary | ICD-10-CM | POA: Diagnosis not present

## 2022-08-03 ENCOUNTER — Ambulatory Visit: Payer: Medicare Other | Attending: Internal Medicine

## 2022-08-03 DIAGNOSIS — M6281 Muscle weakness (generalized): Secondary | ICD-10-CM | POA: Insufficient documentation

## 2022-08-03 DIAGNOSIS — G8929 Other chronic pain: Secondary | ICD-10-CM | POA: Insufficient documentation

## 2022-08-03 DIAGNOSIS — M25562 Pain in left knee: Secondary | ICD-10-CM | POA: Insufficient documentation

## 2022-08-03 DIAGNOSIS — M5459 Other low back pain: Secondary | ICD-10-CM | POA: Insufficient documentation

## 2022-08-03 DIAGNOSIS — R2689 Other abnormalities of gait and mobility: Secondary | ICD-10-CM | POA: Insufficient documentation

## 2022-08-03 NOTE — Therapy (Signed)
OUTPATIENT PHYSICAL THERAPY TREATMENT   Patient Name: Terry Shannon MRN: [id number] DOB:[birth year], 67 y.o., female Today's Date: 08/03/2022  END OF SESSION:  PT End of Session - 08/03/22 1655     Visit Number 2    Date for PT Re-Evaluation 09/05/22    Authorization Type Medicare    Progress Note Due on Visit 10    PT Start Time 1616    PT Stop Time 1655    PT Time Calculation (min) 39 min    Activity Tolerance Patient tolerated treatment well    Behavior During Therapy WFL for tasks assessed/performed              Past Medical History:  Diagnosis Date   Allergy    year-round, pt. states   Chronic lower back pain    CMC arthritis, thumb, degenerative [id number]   right   Complication of anesthesia    slow to wake up after gallbladder surgery   Depression    Eczema    ARMS AND HANDS   GERD (gastroesophageal reflux disease)    History of thyroid cancer    Hypothyroidism    Migraines    Osteoarthritis    Overactive bladder    RA (rheumatoid arthritis) ([place])    Sleep apnea    no CPAP use   Squamous cell carcinoma of skin 12/21/2017   in situ-mid chest (CX35FU)   Ulcerative colitis ([place])    Past Surgical History:  Procedure Laterality Date   ABDOMINAL HERNIA REPAIR  123456   periumbilical ventral hernia/incisional hernia   BUNIONECTOMY Right    x 2 more   BUNIONECTOMY Left    BUNIONECTOMY WITH CHILECTOMY Right 12/16/2005   CARPAL TUNNEL RELEASE Right 07/13/2001   CARPAL TUNNEL RELEASE Left 08/10/2001   DILATION AND CURETTAGE OF UTERUS     HAMMER TOE SURGERY Left may 2014   HYSTEROSCOPY WITH D & C  03/01/2004   with exc. of endometrial polyp   KNEE ARTHROSCOPY Right 2013   LAPAROSCOPIC CHOLECYSTECTOMY  11/03/2006   LUMBAR FUSION  03/06/2014   l4  l5    nerve ablation lumbar      THYROIDECTOMY, PARTIAL Right prior to 2002   THYROIDECTOMY, PARTIAL Left 11/29/2000   and isthmus   TONSILLECTOMY  1961   TOTAL ANKLE REPLACEMENT Left 11/2016   with tendon  repair   TOTAL KNEE ARTHROPLASTY Right 04/01/2013   Procedure: RIGHT TOTAL KNEE ARTHROPLASTY;  Surgeon: Alta Corning, MD;  Location: [facility];  Service: Orthopedics;  Laterality: Right;   Patient Active Problem List   Diagnosis Date Noted   Asthmatic bronchitis, mild persistent, uncomplicated [id number]   Upper airway cough syndrome 01/27/2022   OSA on CPAP 04/27/2021   Intolerance of continuous positive airway pressure (CPAP) ventilation 06/17/2020   OSA (obstructive sleep apnea) 05/06/2020   Snoring 05/06/2020   Insomnia secondary to chronic pain 03/24/2020   History of sleep apnea 03/24/2020   Gasping for breath 03/24/2020   Abnormal glucose level 12/16/2019   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   Degeneration of lumbar intervertebral disc 12/16/2019   Hyperlipidemia 12/16/2019   Hypothyroidism 12/16/2019   Insomnia 12/16/2019   Migraine 12/16/2019   Overweight 12/16/2019   Postoperative hypothyroidism 12/16/2019   Vitamin D deficiency 12/16/2019   Hallux rigidus, right foot 12/05/2019   Hammer toe of right foot 12/05/2019   History of left ankle joint replacement 12/05/2019   Aftercare following ankle joint replacement surgery 01/10/2017   Anxiety, mild 12/12/2016   Atypical  chest pain 12/12/2016   Cardiac murmur 12/12/2016   GERD (gastroesophageal reflux disease) 12/12/2016   History of thyroid cancer 12/12/2016   Post-traumatic osteoarthritis of left ankle 10/03/2016   Chronic migraine without aura, with intractable migraine, so stated, with status migrainosus 09/26/2015   Joint pain 02/18/2015   Abnormal C-reactive protein 01/08/2015   Radiculopathy 03/06/2014   Osteoarthritis of right knee 04/01/2013   OA (osteoarthritis) 11/01/2011   OAB (overactive bladder)    Ulcerative colitis ([place])    Cancer ([facility])    Carpal tunnel syndrome    RA (rheumatoid arthritis) ([place])     PCP: Willey Blade, MD  REFERRING PROVIDER: Willey Blade, MD  REFERRING DIAG:  M25.50 (ICD-10-CM) - Pain in unspecified joint   Rationale for Evaluation and Treatment: Rehabilitation  THERAPY DIAG:  Muscle weakness (generalized)  Other low back pain  Other abnormalities of gait and mobility  Chronic pain of left knee  ONSET DATE: 6 months ago  SUBJECTIVE:                                                                                                                                                                                           SUBJECTIVE STATEMENT: I've had a cold so I haven't been feeling great.  The exercise on my side makes my back hurt.    PERTINENT HISTORY:  Lumbar fusion 2015, LBP, Lt total ankle replacement 2018, Rt TKA 2014, migraines, RA  PAIN:  No pain reported today  PRECAUTIONS: None  WEIGHT BEARING RESTRICTIONS: No  FALLS:  Has patient fallen in last 6 months? Yes. Number of falls 4. PT will address balance.   LIVING ENVIRONMENT: Lives with: lives with their family and lives with their partner Lives in: House/apartment Stairs: Yes- uses rail Has following equipment at home: None  OCCUPATION: retired, walks dog  PLOF: Independent  PATIENT GOALS: improve balance, reduce falls risk, improve strength/endurance Walking limited to 30-35 minutes.   NEXT MD VISIT: end of February  OBJECTIVE:   DIAGNOSTIC FINDINGS:  none  COGNITION: Overall cognitive status: Within functional limits for tasks assessed     SENSATION: WFL  MUSCLE LENGTH: Hip flexibility WFLS without pain  POSTURE: forward head  PALPATION: No palpable tenderness  LUMBAR ROM:  WFLs without pain  LOWER EXTREMITY ROM:    WFLs without pain  LOWER EXTREMITY MMT:   Bil hips 4/5, knees 4+/5, ankles 4=/5  FUNCTIONAL TESTS:  5 times sit to stand: 18.79 without UE support-Lt knee pain with this  GAIT: Distance walked: 100 Assistive device utilized: None Level of assistance: Complete Independence Comments: mild scissoring and reduced glute med  activation  bilaterally Steps: step to gait with use of rail due to Lt knee pain  TODAY'S TREATMENT:       DATE: 08/03/22  NuStep: level 1x 6 minutes-PT present to discuss progress Seated hamstring stretch 3x20 seconds  Seated piriformis stretch 3x20 seconds  Standing and seated heel raises   Sit to stand with elevated surface (chair + balance pad=22") x10  Seated hip abduction with red band 2x10 bil  Tandem stance 3x15 seconds bil   Standing hip abduction: 2x10                                                                                                                     DATE: 07/11/22  HEP established-see below  PATIENT EDUCATION:  Education details: Access Code: [email or web address] Person educated: Patient Education method: Explanation, Demonstration, and Handouts Education comprehension: verbalized understanding and returned demonstration  HOME EXERCISE PROGRAM: Access Code: [email or web address] URL: [email or web address] Date: 08/03/2022 Prepared by: Claiborne Billings  Exercises - Seated Hamstring Stretch  - 3 x daily - 7 x weekly - 1 sets - 3 reps - 20 hold - Seated Piriformis Stretch with Trunk Bend  - 3 x daily - 7 x weekly - 1 sets - 3 reps - 20 hold - Heel Raises with Counter Support  - 2 x daily - 7 x weekly - 1-2 sets - 5 reps - Seated Heel Raise  - 2 x daily - 7 x weekly - 2 sets - 10 reps - Seated Hip Abduction with Resistance  - 2 x daily - 7 x weekly - 2 sets - 10 reps - Tandem Stance with Support  - 2 x daily - 7 x weekly - 1 sets - 3 reps - 15 hold - Standing Hip Abduction with Counter Support  - 2 x daily - 7 x weekly - 2 sets - 10 reps  ASSESSMENT:  CLINICAL IMPRESSION: First time follow-up after evaluation 2 weeks ago. Pt has been sick and not able to do exercises as much as prescribed.  Pt had pain with sidelying clam so this was adjusted to be performed sitting with band around thighs. PT added additional balance exercises to HEP and provided verbal cueing.   Patient will  benefit from skilled PT to address the below impairments and improve overall function.   OBJECTIVE IMPAIRMENTS: Abnormal gait, decreased activity tolerance, decreased endurance, difficulty walking, decreased ROM, decreased strength, increased muscle spasms, impaired flexibility, improper body mechanics, postural dysfunction, and pain.   ACTIVITY LIMITATIONS: lifting, standing, squatting, stairs, transfers, and locomotion level  PARTICIPATION LIMITATIONS: meal prep, cleaning, community activity, and yard work  PERSONAL FACTORS: Past/current experiences and 3+ comorbidities: lumbar fusion, Lt knee OA, Rt knee TKA, RA  are also affecting patient's functional outcome.   REHAB POTENTIAL: Good  CLINICAL DECISION MAKING: Evolving/moderate complexity  EVALUATION COMPLEXITY: Moderate   GOALS: Goals reviewed with patient? Yes  SHORT TERM GOALS: Target date: 08/08/2022    Be independent in initial HEP Baseline: Goal status: INITIAL  2.  Perform 5x  sit to stand in < or = to 14 seconds to reduce falls risk  Baseline:  Goal status: INITIAL  3.  Improve LE strength and muscle recruitment to perform sit to stand with > = to 40% reduction in Lt knee pain Baseline:  Goal status: INITIAL  4.  Verbalize falls risk and how to modify outdoor activities to reduce falls  Baseline:  Goal status: INITIAL    LONG TERM GOALS: Target date: 09/09/2022    Be independent in advanced HEP Baseline:  Goal status: INITIAL  2.  Perform 5x sit to stand in < or = to 12 seconds to reduce falls risk  Baseline: 18.79  Goal status: INITIAL  3.  Demonstrate 4+/5 bil hip strength to improve endurance and safety for community distances Baseline:  Goal status: INITIAL  4.  Improve LE strength to negotiate steps with step-over step pattern and use of 1 rail Baseline:  Goal status: INITIAL  5.  Report no falls at home Baseline:  Goal status: INITIAL    PLAN:  PT FREQUENCY: 2x/week  PT DURATION: 8  weeks  PLANNED INTERVENTIONS: Therapeutic exercises, Therapeutic activity, Neuromuscular re-education, Balance training, Gait training, Patient/Family education, Self Care, Joint mobilization, Joint manipulation, Stair training, Aquatic Therapy, Dry Needling, Electrical stimulation, Spinal manipulation, Spinal mobilization, Cryotherapy, Moist heat, Taping, Vasopneumatic device, Manual therapy, and Re-evaluation.  PLAN FOR NEXT SESSION: review HEP, work on balance and strength    Sigurd Sos, PT 08/03/22 4:57 PM   [facility] [address], [address] [place], [place] [address] Phone # [phone] Fax [phone]

## 2022-08-10 ENCOUNTER — Ambulatory Visit: Payer: Medicare Other

## 2022-08-10 DIAGNOSIS — M6281 Muscle weakness (generalized): Secondary | ICD-10-CM | POA: Diagnosis not present

## 2022-08-10 DIAGNOSIS — M25562 Pain in left knee: Secondary | ICD-10-CM | POA: Diagnosis not present

## 2022-08-10 DIAGNOSIS — M5459 Other low back pain: Secondary | ICD-10-CM

## 2022-08-10 DIAGNOSIS — G8929 Other chronic pain: Secondary | ICD-10-CM | POA: Diagnosis not present

## 2022-08-10 DIAGNOSIS — R2689 Other abnormalities of gait and mobility: Secondary | ICD-10-CM

## 2022-08-10 NOTE — Therapy (Signed)
OUTPATIENT PHYSICAL THERAPY TREATMENT   Patient Name: Terry Shannon MRN: [id number] DOB:12/03/1955, 67 y.o., female Today's Date: 08/10/2022  END OF SESSION:  PT End of Session - 08/10/22 1652     Visit Number 3    Date for PT Re-Evaluation 09/05/22    Authorization Type Medicare    Progress Note Due on Visit 10    PT Start Time 1614    PT Stop Time [id number]    PT Time Calculation (min) 38 min    Activity Tolerance Patient tolerated treatment well    Behavior During Therapy WFL for tasks assessed/performed               Past Medical History:  Diagnosis Date   Allergy    year-round, pt. states   Chronic lower back pain    CMC arthritis, thumb, degenerative [id number]   right   Complication of anesthesia    slow to wake up after gallbladder surgery   Depression    Eczema    ARMS AND HANDS   GERD (gastroesophageal reflux disease)    History of thyroid cancer    Hypothyroidism    Migraines    Osteoarthritis    Overactive bladder    RA (rheumatoid arthritis) ([place])    Sleep apnea    no CPAP use   Squamous cell carcinoma of skin 12/21/2017   in situ-mid chest (CX35FU)   Ulcerative colitis ([place])    Past Surgical History:  Procedure Laterality Date   ABDOMINAL HERNIA REPAIR  123456   periumbilical ventral hernia/incisional hernia   BUNIONECTOMY Right    x 2 more   BUNIONECTOMY Left    BUNIONECTOMY WITH CHILECTOMY Right 12/16/2005   CARPAL TUNNEL RELEASE Right 07/13/2001   CARPAL TUNNEL RELEASE Left 08/10/2001   DILATION AND CURETTAGE OF UTERUS     HAMMER TOE SURGERY Left may 2014   HYSTEROSCOPY WITH D & C  03/01/2004   with exc. of endometrial polyp   KNEE ARTHROSCOPY Right 2013   LAPAROSCOPIC CHOLECYSTECTOMY  11/03/2006   LUMBAR FUSION  03/06/2014   l4  l5    nerve ablation lumbar      THYROIDECTOMY, PARTIAL Right prior to 2002   THYROIDECTOMY, PARTIAL Left 11/29/2000   and isthmus   TONSILLECTOMY  1961   TOTAL ANKLE REPLACEMENT Left 11/2016   with tendon  repair   TOTAL KNEE ARTHROPLASTY Right 04/01/2013   Procedure: RIGHT TOTAL KNEE ARTHROPLASTY;  Surgeon: Alta Corning, MD;  Location: [facility];  Service: Orthopedics;  Laterality: Right;   Patient Active Problem List   Diagnosis Date Noted   Asthmatic bronchitis, mild persistent, uncomplicated [id number]   Upper airway cough syndrome 01/27/2022   OSA on CPAP 04/27/2021   Intolerance of continuous positive airway pressure (CPAP) ventilation 06/17/2020   OSA (obstructive sleep apnea) 05/06/2020   Snoring 05/06/2020   Insomnia secondary to chronic pain 03/24/2020   History of sleep apnea 03/24/2020   Gasping for breath 03/24/2020   Abnormal glucose level 12/16/2019   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   Degeneration of lumbar intervertebral disc 12/16/2019   Hyperlipidemia 12/16/2019   Hypothyroidism 12/16/2019   Insomnia 12/16/2019   Migraine 12/16/2019   Overweight 12/16/2019   Postoperative hypothyroidism 12/16/2019   Vitamin D deficiency 12/16/2019   Hallux rigidus, right foot 12/05/2019   Hammer toe of right foot 12/05/2019   History of left ankle joint replacement 12/05/2019   Aftercare following ankle joint replacement surgery 01/10/2017   Anxiety, mild 12/12/2016  Atypical chest pain 12/12/2016   Cardiac murmur 12/12/2016   GERD (gastroesophageal reflux disease) 12/12/2016   History of thyroid cancer 12/12/2016   Post-traumatic osteoarthritis of left ankle 10/03/2016   Chronic migraine without aura, with intractable migraine, so stated, with status migrainosus 09/26/2015   Joint pain 02/18/2015   Abnormal C-reactive protein 01/08/2015   Radiculopathy 03/06/2014   Osteoarthritis of right knee 04/01/2013   OA (osteoarthritis) 11/01/2011   OAB (overactive bladder)    Ulcerative colitis ([place])    Cancer ([place])    Carpal tunnel syndrome    RA (rheumatoid arthritis) ([place])     PCP: Willey Blade, MD  REFERRING PROVIDER: Willey Blade, MD  REFERRING DIAG:  M25.50 (ICD-10-CM) - Pain in unspecified joint   Rationale for Evaluation and Treatment: Rehabilitation  THERAPY DIAG:  Muscle weakness (generalized)  Other low back pain  Other abnormalities of gait and mobility  ONSET DATE: 6 months ago  SUBJECTIVE:                                                                                                                                                                                           SUBJECTIVE STATEMENT: I had back and foot pain with some of the exercises.  I think it was the hip   PERTINENT HISTORY:  Lumbar fusion 2015, LBP, Lt total ankle replacement 2018, Rt TKA 2014, migraines, RA  PAIN:  No pain reported today  PRECAUTIONS: None  WEIGHT BEARING RESTRICTIONS: No  FALLS:  Has patient fallen in last 6 months? Yes. Number of falls 4. PT will address balance.   LIVING ENVIRONMENT: Lives with: lives with their family and lives with their partner Lives in: House/apartment Stairs: Yes- uses rail Has following equipment at home: None  OCCUPATION: retired, walks dog  PLOF: Independent  PATIENT GOALS: improve balance, reduce falls risk, improve strength/endurance Walking limited to 30-35 minutes.   NEXT MD VISIT: end of February  OBJECTIVE:   DIAGNOSTIC FINDINGS:  none  COGNITION: Overall cognitive status: Within functional limits for tasks assessed     SENSATION: WFL  MUSCLE LENGTH: Hip flexibility WFLS without pain  POSTURE: forward head  PALPATION: No palpable tenderness  LUMBAR ROM:  WFLs without pain  LOWER EXTREMITY ROM:    WFLs without pain  LOWER EXTREMITY MMT:   Bil hips 4/5, knees 4+/5, ankles 4=/5  FUNCTIONAL TESTS:  5 times sit to stand: 18.79 without UE support-Lt knee pain with this 08/10/22: 16.90 seconds without hands and good eccentric control.   GAIT: Distance walked: 100 Assistive device utilized: None Level of assistance: Complete Independence Comments: mild scissoring  and reduced glute  med activation bilaterally Steps: step to gait with use of rail due to Lt knee pain  TODAY'S TREATMENT:       DATE: 08/10/22  NuStep: level 1x 6 minutes-PT present to discuss progress Seated hamstring stretch 3x20 seconds  Seated piriformis stretch 3x20 seconds  Seated heel raises- discussed adding weight to this at home since standing is painful.   5x sit to stand- see above Seated hip abduction with red band 2x10 bil  Tandem stance 3x15 seconds bil   Standing hip abduction: x10 with reduced ROM and TA activation to reduce lumbar strain Standing on balance pad: weight shift 3 ways x1 min each  Standing rocker board x 3 minutes  Alternating step-taps on 4" step 2x10  DATE: 08/03/22  NuStep: level 1x 6 minutes-PT present to discuss progress Seated hamstring stretch 3x20 seconds  Seated piriformis stretch 3x20 seconds  Standing and seated heel raises   Sit to stand with elevated surface (chair + balance pad=22") x10  Seated hip abduction with red band 2x10 bil  Tandem stance 3x15 seconds bil   Standing hip abduction: 2x10                                                                                                                     DATE: 07/11/22  HEP established-see below  PATIENT EDUCATION:  Education details: Access Code: [email or web address] Person educated: Patient Education method: Explanation, Demonstration, and Handouts Education comprehension: verbalized understanding and returned demonstration  HOME EXERCISE PROGRAM: Access Code: [email or web address] URL: [email or web address] Date: 08/10/2022 Prepared by: Claiborne Billings  Exercises - Seated Hamstring Stretch  - 3 x daily - 7 x weekly - 1 sets - 3 reps - 20 hold - Seated Piriformis Stretch with Trunk Bend  - 3 x daily - 7 x weekly - 1 sets - 3 reps - 20 hold - Seated Heel Raise  - 2 x daily - 7 x weekly - 2 sets - 10 reps - Seated Hip Abduction with Resistance  - 2 x daily - 7 x weekly - 2 sets - 10 reps - Tandem  Stance with Support  - 2 x daily - 7 x weekly - 1 sets - 3 reps - 15 hold - Standing Hip Abduction with Counter Support  - 2 x daily - 7 x weekly - 2 sets - 10 reps  ASSESSMENT:  CLINICAL IMPRESSION: Pt reports LBP and bil foot pain after doing HEP issued last session.  She reports that she is doing it all at the same time and PT suggested that she break it into smaller sets of exercises to reduce fatigue or compensatory motion due to fatigue.  Pt has not challenged her balance recently due to illness and has not been walking her dog recently due to instability. 5x sit to stand is improved today, indicating reduced falls risk and pt demonstrates improved control with this.   Patient will benefit from skilled PT to address the below impairments and improve overall function.   OBJECTIVE IMPAIRMENTS: Abnormal  gait, decreased activity tolerance, decreased endurance, difficulty walking, decreased ROM, decreased strength, increased muscle spasms, impaired flexibility, improper body mechanics, postural dysfunction, and pain.   ACTIVITY LIMITATIONS: lifting, standing, squatting, stairs, transfers, and locomotion level  PARTICIPATION LIMITATIONS: meal prep, cleaning, community activity, and yard work  PERSONAL FACTORS: Past/current experiences and 3+ comorbidities: lumbar fusion, Lt knee OA, Rt knee TKA, RA  are also affecting patient's functional outcome.   REHAB POTENTIAL: Good  CLINICAL DECISION MAKING: Evolving/moderate complexity  EVALUATION COMPLEXITY: Moderate   GOALS: Goals reviewed with patient? Yes  SHORT TERM GOALS: Target date: 08/08/2022    Be independent in initial HEP Baseline: Goal status: MET  2.  Perform 5x sit to stand in < or = to 14 seconds to reduce falls risk  Baseline: 16.9 without hands  Goal status: MET  3.  Improve LE strength and muscle recruitment to perform sit to stand with > = to 40% reduction in Lt knee pain Baseline: no change reported (08/10/22) Goal  status: INITIAL  4.  Verbalize falls risk and how to modify outdoor activities to reduce falls  Baseline:  Goal status: INITIAL    LONG TERM GOALS: Target date: 09/09/2022    Be independent in advanced HEP Baseline:  Goal status: INITIAL  2.  Perform 5x sit to stand in < or = to 12 seconds to reduce falls risk  Baseline: 18.79  Goal status: INITIAL  3.  Demonstrate 4+/5 bil hip strength to improve endurance and safety for community distances Baseline:  Goal status: INITIAL  4.  Improve LE strength to negotiate steps with step-over step pattern and use of 1 rail Baseline:  Goal status: INITIAL  5.  Report no falls at home Baseline:  Goal status: INITIAL    PLAN:  PT FREQUENCY: 2x/week  PT DURATION: 8 weeks  PLANNED INTERVENTIONS: Therapeutic exercises, Therapeutic activity, Neuromuscular re-education, Balance training, Gait training, Patient/Family education, Self Care, Joint mobilization, Joint manipulation, Stair training, Aquatic Therapy, Dry Needling, Electrical stimulation, Spinal manipulation, Spinal mobilization, Cryotherapy, Moist heat, Taping, Vasopneumatic device, Manual therapy, and Re-evaluation.  PLAN FOR NEXT SESSION: review HEP, work on balance and strength    Sigurd Sos, PT 08/10/22 4:53 PM   [facility] [address] [place], [place] [address] Phone # [phone] Fax [phone]

## 2022-08-11 NOTE — Therapy (Signed)
OUTPATIENT PHYSICAL THERAPY TREATMENT   Patient Name: Terry Shannon MRN: [id number] DOB:03-28-1956, 67 y.o., female Today's Date: 08/12/2022  END OF SESSION:  PT End of Session - 08/12/22 1019     Visit Number 4    Date for PT Re-Evaluation 09/05/22    Authorization Type Medicare    Progress Note Due on Visit 10    PT Start Time [id number]    PT Stop Time 1055    PT Time Calculation (min) 40 min    Activity Tolerance Patient tolerated treatment well    Behavior During Therapy WFL for tasks assessed/performed                Past Medical History:  Diagnosis Date   Allergy    year-round, pt. states   Chronic lower back pain    CMC arthritis, thumb, degenerative [id number]   right   Complication of anesthesia    slow to wake up after gallbladder surgery   Depression    Eczema    ARMS AND HANDS   GERD (gastroesophageal reflux disease)    History of thyroid cancer    Hypothyroidism    Migraines    Osteoarthritis    Overactive bladder    RA (rheumatoid arthritis) ([place])    Sleep apnea    no CPAP use   Squamous cell carcinoma of skin 12/21/2017   in situ-mid chest (CX35FU)   Ulcerative colitis ([place])    Past Surgical History:  Procedure Laterality Date   ABDOMINAL HERNIA REPAIR  123456   periumbilical ventral hernia/incisional hernia   BUNIONECTOMY Right    x 2 more   BUNIONECTOMY Left    BUNIONECTOMY WITH CHILECTOMY Right 12/16/2005   CARPAL TUNNEL RELEASE Right 07/13/2001   CARPAL TUNNEL RELEASE Left 08/10/2001   DILATION AND CURETTAGE OF UTERUS     HAMMER TOE SURGERY Left may 2014   HYSTEROSCOPY WITH D & C  03/01/2004   with exc. of endometrial polyp   KNEE ARTHROSCOPY Right 2013   LAPAROSCOPIC CHOLECYSTECTOMY  11/03/2006   LUMBAR FUSION  03/06/2014   l4  l5    nerve ablation lumbar      THYROIDECTOMY, PARTIAL Right prior to 2002   THYROIDECTOMY, PARTIAL Left 11/29/2000   and isthmus   TONSILLECTOMY  1961   TOTAL ANKLE REPLACEMENT Left 11/2016   with tendon  repair   TOTAL KNEE ARTHROPLASTY Right 04/01/2013   Procedure: RIGHT TOTAL KNEE ARTHROPLASTY;  Surgeon: Alta Corning, MD;  Location: [facility];  Service: Orthopedics;  Laterality: Right;   Patient Active Problem List   Diagnosis Date Noted   Asthmatic bronchitis, mild persistent, uncomplicated [id number]   Upper airway cough syndrome 01/27/2022   OSA on CPAP 04/27/2021   Intolerance of continuous positive airway pressure (CPAP) ventilation 06/17/2020   OSA (obstructive sleep apnea) 05/06/2020   Snoring 05/06/2020   Insomnia secondary to chronic pain 03/24/2020   History of sleep apnea 03/24/2020   Gasping for breath 03/24/2020   Abnormal glucose level 12/16/2019   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   Degeneration of lumbar intervertebral disc 12/16/2019   Hyperlipidemia 12/16/2019   Hypothyroidism 12/16/2019   Insomnia 12/16/2019   Migraine 12/16/2019   Overweight 12/16/2019   Postoperative hypothyroidism 12/16/2019   Vitamin D deficiency 12/16/2019   Hallux rigidus, right foot 12/05/2019   Hammer toe of right foot 12/05/2019   History of left ankle joint replacement 12/05/2019   Aftercare following ankle joint replacement surgery 01/10/2017   Anxiety, mild 12/12/2016  Atypical chest pain 12/12/2016   Cardiac murmur 12/12/2016   GERD (gastroesophageal reflux disease) 12/12/2016   History of thyroid cancer 12/12/2016   Post-traumatic osteoarthritis of left ankle 10/03/2016   Chronic migraine without aura, with intractable migraine, so stated, with status migrainosus 09/26/2015   Joint pain 02/18/2015   Abnormal C-reactive protein 01/08/2015   Radiculopathy 03/06/2014   Osteoarthritis of right knee 04/01/2013   OA (osteoarthritis) 11/01/2011   OAB (overactive bladder)    Ulcerative colitis ([place])    Cancer ([place])    Carpal tunnel syndrome    RA (rheumatoid arthritis) ([place])     PCP: Willey Blade, MD  REFERRING PROVIDER: Willey Blade, MD  REFERRING DIAG:  M25.50 (ICD-10-CM) - Pain in unspecified joint   Rationale for Evaluation and Treatment: Rehabilitation  THERAPY DIAG:  Muscle weakness (generalized)  Other low back pain  Other abnormalities of gait and mobility  ONSET DATE: 6 months ago  SUBJECTIVE:                                                                                                                                                                                           SUBJECTIVE STATEMENT: Pt states that her back pain flared up after last PT session. She thinks it may have been the standing hip abduction.   PERTINENT HISTORY:  Lumbar fusion 2015, LBP, Lt total ankle replacement 2018, Rt TKA 2014, migraines, RA  PAIN:  No pain reported today  PRECAUTIONS: None  WEIGHT BEARING RESTRICTIONS: No  FALLS:  Has patient fallen in last 6 months? Yes. Number of falls 4. PT will address balance.   LIVING ENVIRONMENT: Lives with: lives with their family and lives with their partner Lives in: House/apartment Stairs: Yes- uses rail Has following equipment at home: None  OCCUPATION: retired, walks dog  PLOF: Independent  PATIENT GOALS: improve balance, reduce falls risk, improve strength/endurance Walking limited to 30-35 minutes.   NEXT MD VISIT: end of February  OBJECTIVE:   DIAGNOSTIC FINDINGS:  none  COGNITION: Overall cognitive status: Within functional limits for tasks assessed     SENSATION: WFL  MUSCLE LENGTH: Hip flexibility WFLS without pain  POSTURE: forward head  PALPATION: No palpable tenderness  LUMBAR ROM:  WFLs without pain  LOWER EXTREMITY ROM:    WFLs without pain  LOWER EXTREMITY MMT:   Bil hips 4/5, knees 4+/5, ankles 4=/5  FUNCTIONAL TESTS:  5 times sit to stand: 18.79 without UE support-Lt knee pain with this 08/10/22: 16.90 seconds without hands and good eccentric control.   GAIT: Distance walked: 100 Assistive device utilized: None Level of assistance: Complete  Independence Comments: mild  scissoring and reduced glute med activation bilaterally Steps: step to gait with use of rail due to Lt knee pain  TODAY'S TREATMENT:    DATE: 08/12/22  NuStep: level 5x 6 minutes-PT present to discuss progress Seated hamstring stretch 3x20 seconds  Seated piriformis stretch 3x20 seconds  Supine LTR for LBP x30  Supine PPT x20  Seated heel raises Seated hip abduction with red band 2x10 bil  Seated marches with red band 2x10 bil 5x sit to stand  Tandem stance 3x15 seconds bil Standing on balance pad: weight shift 3 ways x2 min each   DATE: 08/10/22  NuStep: level 1x 6 minutes-PT present to discuss progress Seated hamstring stretch 3x20 seconds  Seated piriformis stretch 3x20 seconds  Seated heel raises- discussed adding weight to this at home since standing is painful.   5x sit to stand- see above Seated hip abduction with red band 2x10 bil  Tandem stance 3x15 seconds bil   Standing hip abduction: x10 with reduced ROM and TA activation to reduce lumbar strain Standing on balance pad: weight shift 3 ways x1 min each  Standing rocker board x 3 minutes  Alternating step-taps on 4" step 2x10  DATE: 08/03/22  NuStep: level 1x 6 minutes-PT present to discuss progress Seated hamstring stretch 3x20 seconds  Seated piriformis stretch 3x20 seconds  Standing and seated heel raises   Sit to stand with elevated surface (chair + balance pad=22") x10  Seated hip abduction with red band 2x10 bil  Tandem stance 3x15 seconds bil   Standing hip abduction: 2x10                                                                                                                     DATE: 07/11/22  HEP established-see below  PATIENT EDUCATION:  Education details: Access Code: [email or web address] Person educated: Patient Education method: Explanation, Demonstration, and Handouts Education comprehension: verbalized understanding and returned demonstration  HOME EXERCISE  PROGRAM: Access Code: [email or web address] URL: [email or web address] Date: 08/10/2022 Prepared by: Claiborne Billings  Exercises - Seated Hamstring Stretch  - 3 x daily - 7 x weekly - 1 sets - 3 reps - 20 hold - Seated Piriformis Stretch with Trunk Bend  - 3 x daily - 7 x weekly - 1 sets - 3 reps - 20 hold - Seated Heel Raise  - 2 x daily - 7 x weekly - 2 sets - 10 reps - Seated Hip Abduction with Resistance  - 2 x daily - 7 x weekly - 2 sets - 10 reps - Tandem Stance with Support  - 2 x daily - 7 x weekly - 1 sets - 3 reps - 15 hold - Standing Hip Abduction with Counter Support  - 2 x daily - 7 x weekly - 2 sets - 10 reps  ASSESSMENT:  CLINICAL IMPRESSION: Pt reports LBP after last session. Session initiated with lumbar mobility with improvements reported with LBP.  She demonstrates improvements with tolerance to therex today with no residual discomfort. Pt only noted  mild onset of foot pain with weight shifting fwd/ bkwd today. Pt departs with no reports of pain today. Continued encouragement of HEP as tolerated at home with breaks when needed. Patient will benefit from skilled PT to address the below impairments and improve overall function.   OBJECTIVE IMPAIRMENTS: Abnormal gait, decreased activity tolerance, decreased endurance, difficulty walking, decreased ROM, decreased strength, increased muscle spasms, impaired flexibility, improper body mechanics, postural dysfunction, and pain.   ACTIVITY LIMITATIONS: lifting, standing, squatting, stairs, transfers, and locomotion level  PARTICIPATION LIMITATIONS: meal prep, cleaning, community activity, and yard work  PERSONAL FACTORS: Past/current experiences and 3+ comorbidities: lumbar fusion, Lt knee OA, Rt knee TKA, RA  are also affecting patient's functional outcome.   REHAB POTENTIAL: Good  CLINICAL DECISION MAKING: Evolving/moderate complexity  EVALUATION COMPLEXITY: Moderate   GOALS: Goals reviewed with patient? Yes  SHORT TERM GOALS:  Target date: 08/08/2022    Be independent in initial HEP Baseline: Goal status: MET  2.  Perform 5x sit to stand in < or = to 14 seconds to reduce falls risk  Baseline: 16.9 without hands  Goal status: MET  3.  Improve LE strength and muscle recruitment to perform sit to stand with > = to 40% reduction in Lt knee pain Baseline: no change reported (08/10/22) Goal status: INITIAL  4.  Verbalize falls risk and how to modify outdoor activities to reduce falls  Baseline:  Goal status: INITIAL    LONG TERM GOALS: Target date: 09/09/2022    Be independent in advanced HEP Baseline:  Goal status: INITIAL  2.  Perform 5x sit to stand in < or = to 12 seconds to reduce falls risk  Baseline: 18.79  Goal status: INITIAL  3.  Demonstrate 4+/5 bil hip strength to improve endurance and safety for community distances Baseline:  Goal status: INITIAL  4.  Improve LE strength to negotiate steps with step-over step pattern and use of 1 rail Baseline:  Goal status: INITIAL  5.  Report no falls at home Baseline:  Goal status: INITIAL    PLAN:  PT FREQUENCY: 2x/week  PT DURATION: 8 weeks  PLANNED INTERVENTIONS: Therapeutic exercises, Therapeutic activity, Neuromuscular re-education, Balance training, Gait training, Patient/Family education, Self Care, Joint mobilization, Joint manipulation, Stair training, Aquatic Therapy, Dry Needling, Electrical stimulation, Spinal manipulation, Spinal mobilization, Cryotherapy, Moist heat, Taping, Vasopneumatic device, Manual therapy, and Re-evaluation.  PLAN FOR NEXT SESSION: review HEP, work on balance and strength    Rudi Heap PT, DPT 08/12/22  10:57 AM

## 2022-08-12 ENCOUNTER — Ambulatory Visit: Payer: Medicare Other | Admitting: Physical Therapy

## 2022-08-12 DIAGNOSIS — G8929 Other chronic pain: Secondary | ICD-10-CM | POA: Diagnosis not present

## 2022-08-12 DIAGNOSIS — R2689 Other abnormalities of gait and mobility: Secondary | ICD-10-CM

## 2022-08-12 DIAGNOSIS — M25562 Pain in left knee: Secondary | ICD-10-CM | POA: Diagnosis not present

## 2022-08-12 DIAGNOSIS — M6281 Muscle weakness (generalized): Secondary | ICD-10-CM | POA: Diagnosis not present

## 2022-08-12 DIAGNOSIS — M5459 Other low back pain: Secondary | ICD-10-CM | POA: Diagnosis not present

## 2022-08-15 ENCOUNTER — Ambulatory Visit
Admission: RE | Admit: 2022-08-15 | Discharge: 2022-08-15 | Disposition: A | Discharge status: Home / Self Care | Payer: Medicare Other | Source: Ambulatory Visit | Attending: Internal Medicine | Admitting: Internal Medicine

## 2022-08-15 ENCOUNTER — Other Ambulatory Visit: Payer: Medicare Other

## 2022-08-15 DIAGNOSIS — H5713 Ocular pain, bilateral: Secondary | ICD-10-CM | POA: Diagnosis not present

## 2022-08-15 DIAGNOSIS — Z1231 Encounter for screening mammogram for malignant neoplasm of breast: Secondary | ICD-10-CM | POA: Diagnosis not present

## 2022-08-15 DIAGNOSIS — H04123 Dry eye syndrome of bilateral lacrimal glands: Secondary | ICD-10-CM | POA: Diagnosis not present

## 2022-08-15 DIAGNOSIS — H16212 Exposure keratoconjunctivitis, left eye: Secondary | ICD-10-CM | POA: Diagnosis not present

## 2022-08-15 DIAGNOSIS — H16213 Exposure keratoconjunctivitis, bilateral: Secondary | ICD-10-CM | POA: Diagnosis not present

## 2022-08-16 ENCOUNTER — Encounter: Payer: Self-pay | Admitting: Physical Therapy

## 2022-08-16 ENCOUNTER — Ambulatory Visit: Payer: Medicare Other | Admitting: Physical Therapy

## 2022-08-16 DIAGNOSIS — R2689 Other abnormalities of gait and mobility: Secondary | ICD-10-CM

## 2022-08-16 DIAGNOSIS — G8929 Other chronic pain: Secondary | ICD-10-CM | POA: Diagnosis not present

## 2022-08-16 DIAGNOSIS — M5459 Other low back pain: Secondary | ICD-10-CM

## 2022-08-16 DIAGNOSIS — M6281 Muscle weakness (generalized): Secondary | ICD-10-CM | POA: Diagnosis not present

## 2022-08-16 DIAGNOSIS — M25562 Pain in left knee: Secondary | ICD-10-CM | POA: Diagnosis not present

## 2022-08-16 NOTE — Therapy (Unsigned)
OUTPATIENT PHYSICAL THERAPY TREATMENT   Patient Name: Terry Shannon MRN: [id number] DOB:[birth year], 67 y.o., female Today's Date: 08/18/2022  END OF SESSION:  PT End of Session - 08/18/22 1013     Visit Number 6    Date for PT Re-Evaluation 09/05/22    Authorization Type Medicare    Progress Note Due on Visit 10    PT Start Time 0930    PT Stop Time 1010    PT Time Calculation (min) 40 min    Activity Tolerance Patient tolerated treatment well    Behavior During Therapy WFL for tasks assessed/performed                  Past Medical History:  Diagnosis Date   Allergy    year-round, pt. states   Chronic lower back pain    CMC arthritis, thumb, degenerative [id number]   right   Complication of anesthesia    slow to wake up after gallbladder surgery   Depression    Eczema    ARMS AND HANDS   GERD (gastroesophageal reflux disease)    History of thyroid cancer    Hypothyroidism    Migraines    Osteoarthritis    Overactive bladder    RA (rheumatoid arthritis) ([place])    Sleep apnea    no CPAP use   Squamous cell carcinoma of skin 12/21/2017   in situ-mid chest (CX35FU)   Ulcerative colitis ([place])    Past Surgical History:  Procedure Laterality Date   ABDOMINAL HERNIA REPAIR  123456   periumbilical ventral hernia/incisional hernia   BUNIONECTOMY Right    x 2 more   BUNIONECTOMY Left    BUNIONECTOMY WITH CHILECTOMY Right 12/16/2005   CARPAL TUNNEL RELEASE Right 07/13/2001   CARPAL TUNNEL RELEASE Left 08/10/2001   DILATION AND CURETTAGE OF UTERUS     HAMMER TOE SURGERY Left may 2014   HYSTEROSCOPY WITH D & C  03/01/2004   with exc. of endometrial polyp   KNEE ARTHROSCOPY Right 2013   LAPAROSCOPIC CHOLECYSTECTOMY  11/03/2006   LUMBAR FUSION  03/06/2014   l4  l5    nerve ablation lumbar      THYROIDECTOMY, PARTIAL Right prior to 2002   THYROIDECTOMY, PARTIAL Left 11/29/2000   and isthmus   TONSILLECTOMY  1961   TOTAL ANKLE REPLACEMENT Left 11/2016   with  tendon repair   TOTAL KNEE ARTHROPLASTY Right 04/01/2013   Procedure: RIGHT TOTAL KNEE ARTHROPLASTY;  Surgeon: Alta Corning, MD;  Location: [facility];  Service: Orthopedics;  Laterality: Right;   Patient Active Problem List   Diagnosis Date Noted   Asthmatic bronchitis, mild persistent, uncomplicated [id number]   Upper airway cough syndrome 01/27/2022   OSA on CPAP 04/27/2021   Intolerance of continuous positive airway pressure (CPAP) ventilation 06/17/2020   OSA (obstructive sleep apnea) 05/06/2020   Snoring 05/06/2020   Insomnia secondary to chronic pain 03/24/2020   History of sleep apnea 03/24/2020   Gasping for breath 03/24/2020   Abnormal glucose level 12/16/2019   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   Degeneration of lumbar intervertebral disc 12/16/2019   Hyperlipidemia 12/16/2019   Hypothyroidism 12/16/2019   Insomnia 12/16/2019   Migraine 12/16/2019   Overweight 12/16/2019   Postoperative hypothyroidism 12/16/2019   Vitamin D deficiency 12/16/2019   Hallux rigidus, right foot 12/05/2019   Hammer toe of right foot 12/05/2019   History of left ankle joint replacement 12/05/2019   Aftercare following ankle joint replacement surgery 01/10/2017   Anxiety, mild  12/12/2016   Atypical chest pain 12/12/2016   Cardiac murmur 12/12/2016   GERD (gastroesophageal reflux disease) 12/12/2016   History of thyroid cancer 12/12/2016   Post-traumatic osteoarthritis of left ankle 10/03/2016   Chronic migraine without aura, with intractable migraine, so stated, with status migrainosus 09/26/2015   Joint pain 02/18/2015   Abnormal C-reactive protein 01/08/2015   Radiculopathy 03/06/2014   Osteoarthritis of right knee 04/01/2013   OA (osteoarthritis) 11/01/2011   OAB (overactive bladder)    Ulcerative colitis ([place])    Cancer (HCC)    Carpal tunnel syndrome    RA (rheumatoid arthritis) ([place])     PCP: Willey Blade, MD  REFERRING PROVIDER: Willey Blade, MD  REFERRING  DIAG: M25.50 (ICD-10-CM) - Pain in unspecified joint   Rationale for Evaluation and Treatment: Rehabilitation  THERAPY DIAG:  Muscle weakness (generalized)  Other low back pain  Other abnormalities of gait and mobility  ONSET DATE: 6 months ago  SUBJECTIVE:                                                                                                                                                                                           SUBJECTIVE STATEMENT: Pt states that her back pain flared up after last PT session. She thinks it may have been the standing hip abduction.   PERTINENT HISTORY:  Lumbar fusion 2015, LBP, Lt total ankle replacement 2018, Rt TKA 2014, migraines, RA  PAIN:  No pain reported today  PRECAUTIONS: None  WEIGHT BEARING RESTRICTIONS: No  FALLS:  Has patient fallen in last 6 months? Yes. Number of falls 4. PT will address balance.   LIVING ENVIRONMENT: Lives with: lives with their family and lives with their partner Lives in: House/apartment Stairs: Yes- uses rail Has following equipment at home: None  OCCUPATION: retired, walks dog  PLOF: Independent  PATIENT GOALS: improve balance, reduce falls risk, improve strength/endurance Walking limited to 30-35 minutes.   NEXT MD VISIT: end of February  OBJECTIVE:   DIAGNOSTIC FINDINGS:  none  COGNITION: Overall cognitive status: Within functional limits for tasks assessed     SENSATION: WFL  MUSCLE LENGTH: Hip flexibility WFLS without pain  POSTURE: forward head  PALPATION: No palpable tenderness  LUMBAR ROM:  WFLs without pain  LOWER EXTREMITY ROM:    WFLs without pain  LOWER EXTREMITY MMT:   Bil hips 4/5, knees 4+/5, ankles 4=/5  FUNCTIONAL TESTS:  5 times sit to stand: 18.79 without UE support-Lt knee pain with this 08/10/22: 16.90 seconds without hands and good eccentric control.   GAIT: Distance walked: 100 Assistive device utilized: None Level of assistance:  Complete  Independence Comments: mild scissoring and reduced glute med activation bilaterally Steps: step to gait with use of rail due to Lt knee pain  TODAY'S TREATMENT:    DATE: 08/18/22  NuStep: level 5x 6 minutes-PT present to discuss progress Seated hamstring stretch 3x20 seconds  Seated piriformis stretch 3x20 seconds  Seated PPT x20  Seated heel raises Seated hip abduction with blue band 2x10 bil  Seated marches with 2lb weight 2x10 bil  15x sit to stand with blue band around knees. Tandem stance 3x15 seconds bil Standing on balance pad: weight shift 3 ways x2 min each  Standing hip abduction/ flexion, x10 each, bilat  Step up to first step 10x each LE lead fwd/ lateral   DATE: 08/16/22  NuStep: level 5x 6 minutes-PT present to discuss progress Seated hamstring stretch 3x20 seconds  Seated piriformis stretch 3x20 seconds  Seated PPT x20  Seated heel raises Seated hip abduction with blue band 2x10 bil  Seated marches with blue band 2x10 bil 15x sit to stand with blue band around knees.  Seated hip flexion over one, bilat 2x10  Tandem stance 3x15 seconds bil Standing on balance pad: weight shift 3 ways x2 min each  Standing hip abduction/ flexion, x10 each, bilat  Step up to first step 5x each LE lead.  Physioball roll outs 3 ways x10   DATE: 08/12/22  NuStep: level 5x 6 minutes-PT present to discuss progress Seated hamstring stretch 3x20 seconds  Seated piriformis stretch 3x20 seconds  Supine LTR for LBP x30  Supine PPT x20  Seated heel raises Seated hip abduction with red band 2x10 bil  Seated marches with red band 2x10 bil 5x sit to stand  Tandem stance 3x15 seconds bil Standing on balance pad: weight shift 3 ways x2 min each   DATE: 08/10/22  NuStep: level 1x 6 minutes-PT present to discuss progress Seated hamstring stretch 3x20 seconds  Seated piriformis stretch 3x20 seconds  Seated heel raises- discussed adding weight to this at home since standing is  painful.   5x sit to stand- see above Seated hip abduction with red band 2x10 bil  Tandem stance 3x15 seconds bil   Standing hip abduction: x10 with reduced ROM and TA activation to reduce lumbar strain Standing on balance pad: weight shift 3 ways x1 min each  Standing rocker board x 3 minutes  Alternating step-taps on 4" step 2x10  DATE: 08/03/22  NuStep: level 1x 6 minutes-PT present to discuss progress Seated hamstring stretch 3x20 seconds  Seated piriformis stretch 3x20 seconds  Standing and seated heel raises   Sit to stand with elevated surface (chair + balance pad=22") x10  Seated hip abduction with red band 2x10 bil  Tandem stance 3x15 seconds bil   Standing hip abduction: 2x10                                                                                                                     DATE: 07/11/22  HEP established-see below  PATIENT EDUCATION:  Education details: Access Code: [email or web address] Person educated: Patient Education method: Explanation, Demonstration, and Handouts Education comprehension: verbalized understanding and returned demonstration  HOME EXERCISE PROGRAM: Access Code: [email or web address] URL: [email or web address] Date: 08/10/2022 Prepared by: Claiborne Billings  Exercises - Seated Hamstring Stretch  - 3 x daily - 7 x weekly - 1 sets - 3 reps - 20 hold - Seated Piriformis Stretch with Trunk Bend  - 3 x daily - 7 x weekly - 1 sets - 3 reps - 20 hold - Seated Heel Raise  - 2 x daily - 7 x weekly - 2 sets - 10 reps - Seated Hip Abduction with Resistance  - 2 x daily - 7 x weekly - 2 sets - 10 reps - Tandem Stance with Support  - 2 x daily - 7 x weekly - 1 sets - 3 reps - 15 hold - Standing Hip Abduction with Counter Support  - 2 x daily - 7 x weekly - 2 sets - 10 reps  ASSESSMENT:  CLINICAL IMPRESSION: Pt reports no increase in pain after last session despite progressions. Session kept very similar with mild modifications to continue with LE strengthening as  tolerated. Cues required for seated exercises due to increased lumbar extension. Pt denies compliance with her HEP. at this time. Patient will benefit from skilled PT to address the below impairments and improve overall function.   OBJECTIVE IMPAIRMENTS: Abnormal gait, decreased activity tolerance, decreased endurance, difficulty walking, decreased ROM, decreased strength, increased muscle spasms, impaired flexibility, improper body mechanics, postural dysfunction, and pain.   ACTIVITY LIMITATIONS: lifting, standing, squatting, stairs, transfers, and locomotion level  PARTICIPATION LIMITATIONS: meal prep, cleaning, community activity, and yard work  PERSONAL FACTORS: Past/current experiences and 3+ comorbidities: lumbar fusion, Lt knee OA, Rt knee TKA, RA  are also affecting patient's functional outcome.   REHAB POTENTIAL: Good  CLINICAL DECISION MAKING: Evolving/moderate complexity  EVALUATION COMPLEXITY: Moderate   GOALS: Goals reviewed with patient? Yes  SHORT TERM GOALS: Target date: 08/08/2022    Be independent in initial HEP Baseline: Goal status: MET  2.  Perform 5x sit to stand in < or = to 14 seconds to reduce falls risk  Baseline: 16.9 without hands  Goal status: MET  3.  Improve LE strength and muscle recruitment to perform sit to stand with > = to 40% reduction in Lt knee pain Baseline: no change reported (08/10/22) Goal status: INITIAL  4.  Verbalize falls risk and how to modify outdoor activities to reduce falls  Baseline:  Goal status: INITIAL    LONG TERM GOALS: Target date: 09/09/2022    Be independent in advanced HEP Baseline:  Goal status: INITIAL  2.  Perform 5x sit to stand in < or = to 12 seconds to reduce falls risk  Baseline: 18.79  Goal status: INITIAL  3.  Demonstrate 4+/5 bil hip strength to improve endurance and safety for community distances Baseline:  Goal status: INITIAL  4.  Improve LE strength to negotiate steps with step-over step  pattern and use of 1 rail Baseline:  Goal status: INITIAL  5.  Report no falls at home Baseline:  Goal status: INITIAL    PLAN:  PT FREQUENCY: 2x/week  PT DURATION: 8 weeks  PLANNED INTERVENTIONS: Therapeutic exercises, Therapeutic activity, Neuromuscular re-education, Balance training, Gait training, Patient/Family education, Self Care, Joint mobilization, Joint manipulation, Stair training, Aquatic Therapy, Dry Needling, Electrical stimulation, Spinal manipulation, Spinal mobilization, Cryotherapy, Moist heat, Taping, Vasopneumatic device, Manual therapy, and Re-evaluation.  PLAN FOR NEXT SESSION:  review HEP, work on balance and strength    Rudi Heap PT, DPT 08/18/22  10:14 AM

## 2022-08-16 NOTE — Therapy (Signed)
OUTPATIENT PHYSICAL THERAPY TREATMENT   Patient Name: Terry Shannon MRN: [id number] DOB:November 29, 1955, 67 y.o., female Today's Date: 08/16/2022  END OF SESSION:  PT End of Session - 08/16/22 1011     Visit Number 5    Date for PT Re-Evaluation 09/05/22    Authorization Type Medicare    Progress Note Due on Visit 10    PT Start Time 1010    PT Stop Time 1050    PT Time Calculation (min) 40 min    Activity Tolerance Patient tolerated treatment well    Behavior During Therapy WFL for tasks assessed/performed                 Past Medical History:  Diagnosis Date   Allergy    year-round, pt. states   Chronic lower back pain    CMC arthritis, thumb, degenerative [id number]   right   Complication of anesthesia    slow to wake up after gallbladder surgery   Depression    Eczema    ARMS AND HANDS   GERD (gastroesophageal reflux disease)    History of thyroid cancer    Hypothyroidism    Migraines    Osteoarthritis    Overactive bladder    RA (rheumatoid arthritis) ([place])    Sleep apnea    no CPAP use   Squamous cell carcinoma of skin 12/21/2017   in situ-mid chest (CX35FU)   Ulcerative colitis ([place])    Past Surgical History:  Procedure Laterality Date   ABDOMINAL HERNIA REPAIR  123456   periumbilical ventral hernia/incisional hernia   BUNIONECTOMY Right    x 2 more   BUNIONECTOMY Left    BUNIONECTOMY WITH CHILECTOMY Right 12/16/2005   CARPAL TUNNEL RELEASE Right 07/13/2001   CARPAL TUNNEL RELEASE Left 08/10/2001   DILATION AND CURETTAGE OF UTERUS     HAMMER TOE SURGERY Left may 2014   HYSTEROSCOPY WITH D & C  03/01/2004   with exc. of endometrial polyp   KNEE ARTHROSCOPY Right 2013   LAPAROSCOPIC CHOLECYSTECTOMY  11/03/2006   LUMBAR FUSION  03/06/2014   l4  l5    nerve ablation lumbar      THYROIDECTOMY, PARTIAL Right prior to 2002   THYROIDECTOMY, PARTIAL Left 11/29/2000   and isthmus   TONSILLECTOMY  1961   TOTAL ANKLE REPLACEMENT Left 11/2016   with  tendon repair   TOTAL KNEE ARTHROPLASTY Right 04/01/2013   Procedure: RIGHT TOTAL KNEE ARTHROPLASTY;  Surgeon: Alta Corning, MD;  Location: [facility];  Service: Orthopedics;  Laterality: Right;   Patient Active Problem List   Diagnosis Date Noted   Asthmatic bronchitis, mild persistent, uncomplicated [id number]   Upper airway cough syndrome 01/27/2022   OSA on CPAP 04/27/2021   Intolerance of continuous positive airway pressure (CPAP) ventilation 06/17/2020   OSA (obstructive sleep apnea) 05/06/2020   Snoring 05/06/2020   Insomnia secondary to chronic pain 03/24/2020   History of sleep apnea 03/24/2020   Gasping for breath 03/24/2020   Abnormal glucose level 12/16/2019   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   Degeneration of lumbar intervertebral disc 12/16/2019   Hyperlipidemia 12/16/2019   Hypothyroidism 12/16/2019   Insomnia 12/16/2019   Migraine 12/16/2019   Overweight 12/16/2019   Postoperative hypothyroidism 12/16/2019   Vitamin D deficiency 12/16/2019   Hallux rigidus, right foot 12/05/2019   Hammer toe of right foot 12/05/2019   History of left ankle joint replacement 12/05/2019   Aftercare following ankle joint replacement surgery 01/10/2017   Anxiety, mild 12/12/2016  Atypical chest pain 12/12/2016   Cardiac murmur 12/12/2016   GERD (gastroesophageal reflux disease) 12/12/2016   History of thyroid cancer 12/12/2016   Post-traumatic osteoarthritis of left ankle 10/03/2016   Chronic migraine without aura, with intractable migraine, so stated, with status migrainosus 09/26/2015   Joint pain 02/18/2015   Abnormal C-reactive protein 01/08/2015   Radiculopathy 03/06/2014   Osteoarthritis of right knee 04/01/2013   OA (osteoarthritis) 11/01/2011   OAB (overactive bladder)    Ulcerative colitis ([place])    Cancer ([place])    Carpal tunnel syndrome    RA (rheumatoid arthritis) ([place])     PCP: Willey Blade, MD  REFERRING PROVIDER: Willey Blade, MD  REFERRING  DIAG: M25.50 (ICD-10-CM) - Pain in unspecified joint   Rationale for Evaluation and Treatment: Rehabilitation  THERAPY DIAG:  Muscle weakness (generalized)  Other low back pain  Other abnormalities of gait and mobility  ONSET DATE: 6 months ago  SUBJECTIVE:                                                                                                                                                                                           SUBJECTIVE STATEMENT: Pt states that her back pain flared up after last PT session. She thinks it may have been the standing hip abduction.   PERTINENT HISTORY:  Lumbar fusion 2015, LBP, Lt total ankle replacement 2018, Rt TKA 2014, migraines, RA  PAIN:  No pain reported today  PRECAUTIONS: None  WEIGHT BEARING RESTRICTIONS: No  FALLS:  Has patient fallen in last 6 months? Yes. Number of falls 4. PT will address balance.   LIVING ENVIRONMENT: Lives with: lives with their family and lives with their partner Lives in: House/apartment Stairs: Yes- uses rail Has following equipment at home: None  OCCUPATION: retired, walks dog  PLOF: Independent  PATIENT GOALS: improve balance, reduce falls risk, improve strength/endurance Walking limited to 30-35 minutes.   NEXT MD VISIT: end of February  OBJECTIVE:   DIAGNOSTIC FINDINGS:  none  COGNITION: Overall cognitive status: Within functional limits for tasks assessed     SENSATION: WFL  MUSCLE LENGTH: Hip flexibility WFLS without pain  POSTURE: forward head  PALPATION: No palpable tenderness  LUMBAR ROM:  WFLs without pain  LOWER EXTREMITY ROM:    WFLs without pain  LOWER EXTREMITY MMT:   Bil hips 4/5, knees 4+/5, ankles 4=/5  FUNCTIONAL TESTS:  5 times sit to stand: 18.79 without UE support-Lt knee pain with this 08/10/22: 16.90 seconds without hands and good eccentric control.   GAIT: Distance walked: 100 Assistive device utilized: None Level of assistance:  Complete Independence Comments: mild  scissoring and reduced glute med activation bilaterally Steps: step to gait with use of rail due to Lt knee pain  TODAY'S TREATMENT:    DATE: 08/16/22  NuStep: level 5x 6 minutes-PT present to discuss progress Seated hamstring stretch 3x20 seconds  Seated piriformis stretch 3x20 seconds  Seated PPT x20  Seated heel raises Seated hip abduction with blue band 2x10 bil  Seated marches with blue band 2x10 bil 15x sit to stand with blue band around knees.  Seated hip flexion over one, bilat 2x10  Tandem stance 3x15 seconds bil Standing on balance pad: weight shift 3 ways x2 min each  Standing hip abduction/ flexion, x10 each, bilat  Step up to first step 5x each LE lead.  Physioball roll outs 3 ways x10   DATE: 08/12/22  NuStep: level 5x 6 minutes-PT present to discuss progress Seated hamstring stretch 3x20 seconds  Seated piriformis stretch 3x20 seconds  Supine LTR for LBP x30  Supine PPT x20  Seated heel raises Seated hip abduction with red band 2x10 bil  Seated marches with red band 2x10 bil 5x sit to stand  Tandem stance 3x15 seconds bil Standing on balance pad: weight shift 3 ways x2 min each   DATE: 08/10/22  NuStep: level 1x 6 minutes-PT present to discuss progress Seated hamstring stretch 3x20 seconds  Seated piriformis stretch 3x20 seconds  Seated heel raises- discussed adding weight to this at home since standing is painful.   5x sit to stand- see above Seated hip abduction with red band 2x10 bil  Tandem stance 3x15 seconds bil   Standing hip abduction: x10 with reduced ROM and TA activation to reduce lumbar strain Standing on balance pad: weight shift 3 ways x1 min each  Standing rocker board x 3 minutes  Alternating step-taps on 4" step 2x10  DATE: 08/03/22  NuStep: level 1x 6 minutes-PT present to discuss progress Seated hamstring stretch 3x20 seconds  Seated piriformis stretch 3x20 seconds  Standing and seated heel raises    Sit to stand with elevated surface (chair + balance pad=22") x10  Seated hip abduction with red band 2x10 bil  Tandem stance 3x15 seconds bil   Standing hip abduction: 2x10                                                                                                                     DATE: 07/11/22  HEP established-see below  PATIENT EDUCATION:  Education details: Access Code: [email or web address] Person educated: Patient Education method: Explanation, Demonstration, and Handouts Education comprehension: verbalized understanding and returned demonstration  HOME EXERCISE PROGRAM: Access Code: [email or web address] URL: [email or web address] Date: 08/10/2022 Prepared by: Claiborne Billings  Exercises - Seated Hamstring Stretch  - 3 x daily - 7 x weekly - 1 sets - 3 reps - 20 hold - Seated Piriformis Stretch with Trunk Bend  - 3 x daily - 7 x weekly - 1 sets - 3 reps - 20 hold - Seated Heel Raise  - 2 x daily - 7 x weekly -  2 sets - 10 reps - Seated Hip Abduction with Resistance  - 2 x daily - 7 x weekly - 2 sets - 10 reps - Tandem Stance with Support  - 2 x daily - 7 x weekly - 1 sets - 3 reps - 15 hold - Standing Hip Abduction with Counter Support  - 2 x daily - 7 x weekly - 2 sets - 10 reps  ASSESSMENT:  CLINICAL IMPRESSION: Pt reports no increase in pain after last session. Session initiated with lumbar mobility with improvements reported with LBP.  She demonstrates improvements with tolerance to therex today with progressions in standing with no residual discomfort. Continued encouragement of HEP as tolerated at home with breaks when needed. Patient will benefit from skilled PT to address the below impairments and improve overall function.   OBJECTIVE IMPAIRMENTS: Abnormal gait, decreased activity tolerance, decreased endurance, difficulty walking, decreased ROM, decreased strength, increased muscle spasms, impaired flexibility, improper body mechanics, postural dysfunction, and pain.   ACTIVITY  LIMITATIONS: lifting, standing, squatting, stairs, transfers, and locomotion level  PARTICIPATION LIMITATIONS: meal prep, cleaning, community activity, and yard work  PERSONAL FACTORS: Past/current experiences and 3+ comorbidities: lumbar fusion, Lt knee OA, Rt knee TKA, RA  are also affecting patient's functional outcome.   REHAB POTENTIAL: Good  CLINICAL DECISION MAKING: Evolving/moderate complexity  EVALUATION COMPLEXITY: Moderate   GOALS: Goals reviewed with patient? Yes  SHORT TERM GOALS: Target date: 08/08/2022    Be independent in initial HEP Baseline: Goal status: MET  2.  Perform 5x sit to stand in < or = to 14 seconds to reduce falls risk  Baseline: 16.9 without hands  Goal status: MET  3.  Improve LE strength and muscle recruitment to perform sit to stand with > = to 40% reduction in Lt knee pain Baseline: no change reported (08/10/22) Goal status: INITIAL  4.  Verbalize falls risk and how to modify outdoor activities to reduce falls  Baseline:  Goal status: INITIAL    LONG TERM GOALS: Target date: 09/09/2022    Be independent in advanced HEP Baseline:  Goal status: INITIAL  2.  Perform 5x sit to stand in < or = to 12 seconds to reduce falls risk  Baseline: 18.79  Goal status: INITIAL  3.  Demonstrate 4+/5 bil hip strength to improve endurance and safety for community distances Baseline:  Goal status: INITIAL  4.  Improve LE strength to negotiate steps with step-over step pattern and use of 1 rail Baseline:  Goal status: INITIAL  5.  Report no falls at home Baseline:  Goal status: INITIAL    PLAN:  PT FREQUENCY: 2x/week  PT DURATION: 8 weeks  PLANNED INTERVENTIONS: Therapeutic exercises, Therapeutic activity, Neuromuscular re-education, Balance training, Gait training, Patient/Family education, Self Care, Joint mobilization, Joint manipulation, Stair training, Aquatic Therapy, Dry Needling, Electrical stimulation, Spinal manipulation, Spinal  mobilization, Cryotherapy, Moist heat, Taping, Vasopneumatic device, Manual therapy, and Re-evaluation.  PLAN FOR NEXT SESSION: review HEP, work on balance and strength    Rudi Heap PT, DPT 08/16/22  10:52 AM

## 2022-08-18 ENCOUNTER — Ambulatory Visit: Payer: Medicare Other | Admitting: Physical Therapy

## 2022-08-18 DIAGNOSIS — G8929 Other chronic pain: Secondary | ICD-10-CM | POA: Diagnosis not present

## 2022-08-18 DIAGNOSIS — M3501 Sicca syndrome with keratoconjunctivitis: Secondary | ICD-10-CM | POA: Diagnosis not present

## 2022-08-18 DIAGNOSIS — M0579 Rheumatoid arthritis with rheumatoid factor of multiple sites without organ or systems involvement: Secondary | ICD-10-CM | POA: Diagnosis not present

## 2022-08-18 DIAGNOSIS — M5459 Other low back pain: Secondary | ICD-10-CM

## 2022-08-18 DIAGNOSIS — M25562 Pain in left knee: Secondary | ICD-10-CM | POA: Diagnosis not present

## 2022-08-18 DIAGNOSIS — Z6827 Body mass index (BMI) 27.0-27.9, adult: Secondary | ICD-10-CM | POA: Diagnosis not present

## 2022-08-18 DIAGNOSIS — E663 Overweight: Secondary | ICD-10-CM | POA: Diagnosis not present

## 2022-08-18 DIAGNOSIS — R2689 Other abnormalities of gait and mobility: Secondary | ICD-10-CM | POA: Diagnosis not present

## 2022-08-18 DIAGNOSIS — M6281 Muscle weakness (generalized): Secondary | ICD-10-CM | POA: Diagnosis not present

## 2022-08-18 DIAGNOSIS — M1991 Primary osteoarthritis, unspecified site: Secondary | ICD-10-CM | POA: Diagnosis not present

## 2022-08-20 ENCOUNTER — Other Ambulatory Visit: Payer: Self-pay | Admitting: Neurology

## 2022-08-22 DIAGNOSIS — Z79899 Other long term (current) drug therapy: Secondary | ICD-10-CM | POA: Diagnosis not present

## 2022-08-22 DIAGNOSIS — R5383 Other fatigue: Secondary | ICD-10-CM | POA: Diagnosis not present

## 2022-08-22 DIAGNOSIS — M0579 Rheumatoid arthritis with rheumatoid factor of multiple sites without organ or systems involvement: Secondary | ICD-10-CM | POA: Diagnosis not present

## 2022-08-23 ENCOUNTER — Ambulatory Visit: Payer: Medicare Other | Attending: Internal Medicine

## 2022-08-23 DIAGNOSIS — G8929 Other chronic pain: Secondary | ICD-10-CM

## 2022-08-23 DIAGNOSIS — M5459 Other low back pain: Secondary | ICD-10-CM | POA: Diagnosis not present

## 2022-08-23 DIAGNOSIS — M6281 Muscle weakness (generalized): Secondary | ICD-10-CM | POA: Diagnosis not present

## 2022-08-23 DIAGNOSIS — R252 Cramp and spasm: Secondary | ICD-10-CM

## 2022-08-23 DIAGNOSIS — R2689 Other abnormalities of gait and mobility: Secondary | ICD-10-CM

## 2022-08-23 DIAGNOSIS — M25562 Pain in left knee: Secondary | ICD-10-CM | POA: Insufficient documentation

## 2022-08-23 NOTE — Therapy (Signed)
OUTPATIENT PHYSICAL THERAPY TREATMENT   Patient Name: Terry Shannon MRN: [id number] DOB:07/18/1955, 67 y.o., female Today's Date: 08/23/2022  END OF SESSION:  PT End of Session - 08/23/22 1315     Visit Number 7    Date for PT Re-Evaluation 09/05/22    Authorization Type Medicare    Progress Note Due on Visit 10    PT Start Time 1234    PT Stop Time 1314    PT Time Calculation (min) 40 min    Activity Tolerance Patient tolerated treatment well    Behavior During Therapy WFL for tasks assessed/performed                   Past Medical History:  Diagnosis Date   Allergy    year-round, pt. states   Chronic lower back pain    CMC arthritis, thumb, degenerative [id number]   right   Complication of anesthesia    slow to wake up after gallbladder surgery   Depression    Eczema    ARMS AND HANDS   GERD (gastroesophageal reflux disease)    History of thyroid cancer    Hypothyroidism    Migraines    Osteoarthritis    Overactive bladder    RA (rheumatoid arthritis)    Sleep apnea    no CPAP use   Squamous cell carcinoma of skin 12/21/2017   in situ-mid chest (CX35FU)   Ulcerative colitis    Past Surgical History:  Procedure Laterality Date   ABDOMINAL HERNIA REPAIR  123456   periumbilical ventral hernia/incisional hernia   BUNIONECTOMY Right    x 2 more   BUNIONECTOMY Left    BUNIONECTOMY WITH CHILECTOMY Right 12/16/2005   CARPAL TUNNEL RELEASE Right 07/13/2001   CARPAL TUNNEL RELEASE Left 08/10/2001   DILATION AND CURETTAGE OF UTERUS     HAMMER TOE SURGERY Left may 2014   HYSTEROSCOPY WITH D & C  03/01/2004   with exc. of endometrial polyp   KNEE ARTHROSCOPY Right 2013   LAPAROSCOPIC CHOLECYSTECTOMY  11/03/2006   LUMBAR FUSION  03/06/2014   l4  l5    nerve ablation lumbar      THYROIDECTOMY, PARTIAL Right prior to 2002   THYROIDECTOMY, PARTIAL Left 11/29/2000   and isthmus   TONSILLECTOMY  1961   TOTAL ANKLE REPLACEMENT Left 11/2016   with tendon repair    TOTAL KNEE ARTHROPLASTY Right 04/01/2013   Procedure: RIGHT TOTAL KNEE ARTHROPLASTY;  Surgeon: Alta Corning, MD;  Location: [facility];  Service: Orthopedics;  Laterality: Right;   Patient Active Problem List   Diagnosis Date Noted   Asthmatic bronchitis, mild persistent, uncomplicated [id number]   Upper airway cough syndrome 01/27/2022   OSA on CPAP 04/27/2021   Intolerance of continuous positive airway pressure (CPAP) ventilation 06/17/2020   OSA (obstructive sleep apnea) 05/06/2020   Snoring 05/06/2020   Insomnia secondary to chronic pain 03/24/2020   History of sleep apnea 03/24/2020   Gasping for breath 03/24/2020   Abnormal glucose level 12/16/2019   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   Degeneration of lumbar intervertebral disc 12/16/2019   Hyperlipidemia 12/16/2019   Hypothyroidism 12/16/2019   Insomnia 12/16/2019   Migraine 12/16/2019   Overweight 12/16/2019   Postoperative hypothyroidism 12/16/2019   Vitamin D deficiency 12/16/2019   Hallux rigidus, right foot 12/05/2019   Hammer toe of right foot 12/05/2019   History of left ankle joint replacement 12/05/2019   Aftercare following ankle joint replacement surgery 01/10/2017   Anxiety, mild 12/12/2016  Atypical chest pain 12/12/2016   Cardiac murmur 12/12/2016   GERD (gastroesophageal reflux disease) 12/12/2016   History of thyroid cancer 12/12/2016   Post-traumatic osteoarthritis of left ankle 10/03/2016   Chronic migraine without aura, with intractable migraine, so stated, with status migrainosus 09/26/2015   Joint pain 02/18/2015   Abnormal C-reactive protein 01/08/2015   Radiculopathy 03/06/2014   Osteoarthritis of right knee 04/01/2013   OA (osteoarthritis) 11/01/2011   OAB (overactive bladder)    Ulcerative colitis    Cancer    Carpal tunnel syndrome    RA (rheumatoid arthritis)     PCP: Willey Blade, MD  REFERRING PROVIDER: Willey Blade, MD  REFERRING DIAG: M25.50 (ICD-10-CM) - Pain in  unspecified joint   Rationale for Evaluation and Treatment: Rehabilitation  THERAPY DIAG:  Muscle weakness (generalized)  Other low back pain  Other abnormalities of gait and mobility  Chronic pain of left knee  Cramp and spasm  ONSET DATE: 6 months ago  SUBJECTIVE:                                                                                                                                                                                           SUBJECTIVE STATEMENT: I feel a little stronger.   PERTINENT HISTORY:  Lumbar fusion 2015, LBP, Lt total ankle replacement 2018, Rt TKA 2014, migraines, RA  PAIN:  No pain reported today  PRECAUTIONS: None  WEIGHT BEARING RESTRICTIONS: No  FALLS:  Has patient fallen in last 6 months? Yes. Number of falls 4. PT will address balance.   LIVING ENVIRONMENT: Lives with: lives with their family and lives with their partner Lives in: House/apartment Stairs: Yes- uses rail Has following equipment at home: None  OCCUPATION: retired, walks dog  PLOF: Independent  PATIENT GOALS: improve balance, reduce falls risk, improve strength/endurance Walking limited to 30-35 minutes.   NEXT MD VISIT: end of February  OBJECTIVE:   DIAGNOSTIC FINDINGS:  none  COGNITION: Overall cognitive status: Within functional limits for tasks assessed     SENSATION: WFL  MUSCLE LENGTH: Hip flexibility WFLS without pain  POSTURE: forward head  PALPATION: No palpable tenderness  LUMBAR ROM:  WFLs without pain  LOWER EXTREMITY ROM:    WFLs without pain  LOWER EXTREMITY MMT:   Bil hips 4/5, knees 4+/5, ankles 4=/5  FUNCTIONAL TESTS:  5 times sit to stand: 18.79 without UE support-Lt knee pain with this 08/10/22: 16.90 seconds without hands and good eccentric control.  08/23/22: 12.5 seconds without hands and good control.    GAIT: Distance walked: 100 Assistive device utilized: None Level of assistance: Complete  Independence Comments: mild  scissoring and reduced glute med activation bilaterally Steps: step to gait with use of rail due to Lt knee pain  TODAY'S TREATMENT:    DATE: 08/23/22  NuStep: level 5x 6 minutes-PT present to discuss progress Seated hamstring stretch 3x20 seconds  Seated piriformis stretch 3x20 seconds  Seated hip abduction with blue band 2x10 bil  Seated marches with 2lb weight 2x10 bil  2x 5 sit to stand for testing  Standing rockerboard x2 minutes Tandem stance 3x15 seconds bil Standing on balance pad: weight shift 3 ways x2 min each  6" step-ups: x10 Rt and Lt (fatigue on Lt)  4" step downs Rtx10, Lt x 5  DATE: 08/18/22  NuStep: level 5x 6 minutes-PT present to discuss progress Seated hamstring stretch 3x20 seconds  Seated piriformis stretch 3x20 seconds  Seated PPT x20  Seated heel raises Seated hip abduction with blue band 2x10 bil  Seated marches with 2lb weight 2x10 bil  15x sit to stand with blue band around knees. Tandem stance 3x15 seconds bil Standing on balance pad: weight shift 3 ways x2 min each  Standing hip abduction/ flexion, x10 each, bilat  Step up to first step 10x each LE lead fwd/ lateral   DATE: 08/16/22  NuStep: level 5x 6 minutes-PT present to discuss progress Seated hamstring stretch 3x20 seconds  Seated piriformis stretch 3x20 seconds  Seated PPT x20  Seated heel raises Seated hip abduction with blue band 2x10 bil  Seated marches with blue band 2x10 bil 15x sit to stand with blue band around knees.  Seated hip flexion over one, bilat 2x10  Tandem stance 3x15 seconds bil Standing on balance pad: weight shift 3 ways x2 min each  Standing hip abduction/ flexion, x10 each, bilat  Step up to first step 5x each LE lead.  Physioball roll outs 3 ways x10   PATIENT EDUCATION:  Education details: Access Code: [email or web address] Person educated: Patient Education method: Explanation, Demonstration, and Handouts Education comprehension: verbalized  understanding and returned demonstration  HOME EXERCISE PROGRAM: Access Code: [email or web address] URL: [email or web address] Date: 08/10/2022 Prepared by: Claiborne Billings  Exercises - Seated Hamstring Stretch  - 3 x daily - 7 x weekly - 1 sets - 3 reps - 20 hold - Seated Piriformis Stretch with Trunk Bend  - 3 x daily - 7 x weekly - 1 sets - 3 reps - 20 hold - Seated Heel Raise  - 2 x daily - 7 x weekly - 2 sets - 10 reps - Seated Hip Abduction with Resistance  - 2 x daily - 7 x weekly - 2 sets - 10 reps - Tandem Stance with Support  - 2 x daily - 7 x weekly - 1 sets - 3 reps - 15 hold - Standing Hip Abduction with Counter Support  - 2 x daily - 7 x weekly - 2 sets - 10 reps  ASSESSMENT:  CLINICAL IMPRESSION: Pt reports that she feels stronger overall.  5x sit to stand is 12.5 seconds without UE support, indicating improved balance overall.  Pt is more confident with standing balance tasks. Pt reports that she has been compliant with HEP.  Patient will benefit from skilled PT to address the below impairments and improve overall function.   OBJECTIVE IMPAIRMENTS: Abnormal gait, decreased activity tolerance, decreased endurance, difficulty walking, decreased ROM, decreased strength, increased muscle spasms, impaired flexibility, improper body mechanics, postural dysfunction, and pain.   ACTIVITY LIMITATIONS: lifting, standing, squatting, stairs, transfers, and locomotion level  PARTICIPATION LIMITATIONS: meal prep, cleaning, community activity,  and yard work  PERSONAL FACTORS: Past/current experiences and 3+ comorbidities: lumbar fusion, Lt knee OA, Rt knee TKA, RA  are also affecting patient's functional outcome.   REHAB POTENTIAL: Good  CLINICAL DECISION MAKING: Evolving/moderate complexity  EVALUATION COMPLEXITY: Moderate   GOALS: Goals reviewed with patient? Yes  SHORT TERM GOALS: Target date: 08/08/2022    Be independent in initial HEP Baseline: Goal status: MET  2.  Perform  5x sit to stand in < or = to 14 seconds to reduce falls risk  Baseline: 12.5 seconds (08/23/22)  Goal status: MET  3.  Improve LE strength and muscle recruitment to perform sit to stand with > = to 40% reduction in Lt knee pain Baseline: no pain with 5x sit to stand test (08/23/22) Goal status: INITIAL  4.  Verbalize falls risk and how to modify outdoor activities to reduce falls  Baseline:  Goal status: MET    LONG TERM GOALS: Target date: 09/09/2022    Be independent in advanced HEP Baseline:  Goal status: INITIAL  2.  Perform 5x sit to stand in < or = to 12 seconds to reduce falls risk  Baseline: 18.79  Goal status: INITIAL  3.  Demonstrate 4+/5 bil hip strength to improve endurance and safety for community distances Baseline:  Goal status: INITIAL  4.  Improve LE strength to negotiate steps with step-over step pattern and use of 1 rail Baseline: able to do alternating pattern 25% of the time.  Goal status: INITIAL  5.  Report no falls at home Baseline: none since start of PT (08/23/22) Goal status: in progress     PLAN:  PT FREQUENCY: 2x/week  PT DURATION: 8 weeks  PLANNED INTERVENTIONS: Therapeutic exercises, Therapeutic activity, Neuromuscular re-education, Balance training, Gait training, Patient/Family education, Self Care, Joint mobilization, Joint manipulation, Stair training, Aquatic Therapy, Dry Needling, Electrical stimulation, Spinal manipulation, Spinal mobilization, Cryotherapy, Moist heat, Taping, Vasopneumatic device, Manual therapy, and Re-evaluation.  PLAN FOR NEXT SESSION:  work on balance and strength    Sigurd Sos, PT 08/23/22 1:16 PM

## 2022-08-25 ENCOUNTER — Ambulatory Visit: Payer: Medicare Other

## 2022-08-25 DIAGNOSIS — R2689 Other abnormalities of gait and mobility: Secondary | ICD-10-CM | POA: Diagnosis not present

## 2022-08-25 DIAGNOSIS — M25562 Pain in left knee: Secondary | ICD-10-CM | POA: Diagnosis not present

## 2022-08-25 DIAGNOSIS — M5459 Other low back pain: Secondary | ICD-10-CM

## 2022-08-25 DIAGNOSIS — H04123 Dry eye syndrome of bilateral lacrimal glands: Secondary | ICD-10-CM | POA: Diagnosis not present

## 2022-08-25 DIAGNOSIS — G8929 Other chronic pain: Secondary | ICD-10-CM | POA: Diagnosis not present

## 2022-08-25 DIAGNOSIS — R252 Cramp and spasm: Secondary | ICD-10-CM | POA: Diagnosis not present

## 2022-08-25 DIAGNOSIS — M6281 Muscle weakness (generalized): Secondary | ICD-10-CM | POA: Diagnosis not present

## 2022-08-25 DIAGNOSIS — S0502XA Injury of conjunctiva and corneal abrasion without foreign body, left eye, initial encounter: Secondary | ICD-10-CM | POA: Diagnosis not present

## 2022-08-25 DIAGNOSIS — H16213 Exposure keratoconjunctivitis, bilateral: Secondary | ICD-10-CM | POA: Diagnosis not present

## 2022-08-25 NOTE — Therapy (Signed)
OUTPATIENT PHYSICAL THERAPY TREATMENT   Patient Name: Terry Shannon MRN: [id number] DOB:13-Mar-1956, 67 y.o., female Today's Date: 08/29/2022  END OF SESSION:  PT End of Session - 08/29/22 1404     Visit Number 9    Date for PT Re-Evaluation 09/05/22    Authorization Type Medicare    Progress Note Due on Visit 10    PT Start Time 1404    PT Stop Time 1445    PT Time Calculation (min) 41 min    Activity Tolerance Patient tolerated treatment well    Behavior During Therapy WFL for tasks assessed/performed             Past Medical History:  Diagnosis Date   Allergy    year-round, pt. 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   right   Complication of anesthesia    slow to wake up after gallbladder surgery   Depression    Eczema    ARMS AND HANDS   GERD (gastroesophageal reflux disease)    History of thyroid cancer    Hypothyroidism    Migraines    Osteoarthritis    Overactive bladder    RA (rheumatoid arthritis)    Sleep apnea    no CPAP use   Squamous cell carcinoma of skin 12/21/2017   in situ-mid chest (CX35FU)   Ulcerative colitis    Past Surgical History:  Procedure Laterality Date   ABDOMINAL HERNIA REPAIR  03/13/2008   periumbilical ventral hernia/incisional hernia   BUNIONECTOMY Right    x 2 more   BUNIONECTOMY Left    BUNIONECTOMY WITH CHILECTOMY Right 12/16/2005   CARPAL TUNNEL RELEASE Right 07/13/2001   CARPAL TUNNEL RELEASE Left 08/10/2001   DILATION AND CURETTAGE OF UTERUS     HAMMER TOE SURGERY Left may 2014   HYSTEROSCOPY WITH D & C  03/01/2004   with exc. of endometrial polyp   KNEE ARTHROSCOPY Right 2013   LAPAROSCOPIC CHOLECYSTECTOMY  11/03/2006   LUMBAR FUSION  03/06/2014   l4  l5    nerve ablation lumbar      THYROIDECTOMY, PARTIAL Right prior to 2002   THYROIDECTOMY, PARTIAL Left 11/29/2000   and isthmus   TONSILLECTOMY  1961   TOTAL ANKLE REPLACEMENT Left 11/2016   with tendon repair   TOTAL  KNEE ARTHROPLASTY Right 04/01/2013   Procedure: RIGHT TOTAL KNEE ARTHROPLASTY;  Surgeon: Harvie Junior, MD;  Location: [facility];  Service: Orthopedics;  Laterality: Right;   Patient Active Problem List   Diagnosis Date Noted   Asthmatic bronchitis, mild persistent, uncomplicated 01/27/2022   Upper airway cough syndrome 01/27/2022   OSA on CPAP 04/27/2021   Intolerance of continuous positive airway pressure (CPAP) ventilation 06/17/2020   OSA (obstructive sleep apnea) 05/06/2020   Snoring 05/06/2020   Insomnia secondary to chronic pain 03/24/2020   History of sleep apnea 03/24/2020   Gasping for breath 03/24/2020   Abnormal glucose level 12/16/2019   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   Degeneration of lumbar intervertebral disc 12/16/2019   Hyperlipidemia 12/16/2019   Hypothyroidism 12/16/2019   Insomnia 12/16/2019   Migraine 12/16/2019   Overweight 12/16/2019   Postoperative hypothyroidism 12/16/2019   Vitamin D deficiency 12/16/2019   Hallux rigidus, right foot 12/05/2019   Hammer toe of right foot 12/05/2019   History of left ankle joint replacement 12/05/2019   Aftercare following ankle joint replacement surgery 01/10/2017   Anxiety, mild 12/12/2016   Atypical chest pain 12/12/2016  Cardiac murmur 12/12/2016   GERD (gastroesophageal reflux disease) 12/12/2016   History of thyroid cancer 12/12/2016   Post-traumatic osteoarthritis of left ankle 10/03/2016   Chronic migraine without aura, with intractable migraine, so stated, with status migrainosus 09/26/2015   Joint pain 02/18/2015   Abnormal C-reactive protein 01/08/2015   Radiculopathy 03/06/2014   Osteoarthritis of right knee 04/01/2013   OA (osteoarthritis) 11/01/2011   OAB (overactive bladder)    Ulcerative colitis    Cancer    Carpal tunnel syndrome    RA (rheumatoid arthritis)     PCP: Andi Devon, MD  REFERRING PROVIDER: Andi Devon, MD  REFERRING DIAG: M25.50 (ICD-10-CM) - Pain in  unspecified joint   Rationale for Evaluation and Treatment: Rehabilitation  THERAPY DIAG:  Muscle weakness (generalized)  Other abnormalities of gait and mobility  Other low back pain  ONSET DATE: 6 months ago  SUBJECTIVE:                                                                                                                                                                                           SUBJECTIVE STATEMENT: I have trouble with stairs due to my R ankle and L knee.   PERTINENT HISTORY:  Lumbar fusion 2015, LBP, Lt total ankle replacement 2018, Rt TKA 2014, migraines, RA  PAIN:  No pain reported today  PRECAUTIONS: None  WEIGHT BEARING RESTRICTIONS: No  FALLS:  Has patient fallen in last 6 months? Yes. Number of falls 4. PT will address balance.   LIVING ENVIRONMENT: Lives with: lives with their family and lives with their partner Lives in: House/apartment Stairs: Yes- uses rail Has following equipment at home: None  OCCUPATION: retired, walks dog  PLOF: Independent  PATIENT GOALS: improve balance, reduce falls risk, improve strength/endurance Walking limited to 30-35 minutes.   NEXT MD VISIT: end of February  OBJECTIVE:   DIAGNOSTIC FINDINGS:  none  COGNITION: Overall cognitive status: Within functional limits for tasks assessed     SENSATION: WFL  MUSCLE LENGTH: Hip flexibility WFLS without pain  POSTURE: forward head  PALPATION: No palpable tenderness  LUMBAR ROM:  WFLs without pain  LOWER EXTREMITY ROM:    WFLs without pain  LOWER EXTREMITY MMT:   Bil hips 4/5, knees 4+/5, ankles 4=/5  FUNCTIONAL TESTS:  5 times sit to stand: 18.79 without UE support-Lt knee pain with this 08/10/22: 16.90 seconds without hands and good eccentric control.  08/23/22: 12.5 seconds without hands and good control.    GAIT: Distance walked: 100 Assistive device utilized: None Level of assistance: Complete Independence Comments: mild  scissoring and reduced glute med activation bilaterally Steps:  step to gait with use of rail due to Lt knee pain  TODAY'S TREATMENT:    DATE: 08/29/22  NuStep: level 5x 6 minutes-PT present to discuss progress Seated hamstring stretch 3x20 seconds  Seated piriformis stretch 3x20 seconds  Calf stretch 4x30 sec with IV on second set  Seated hip abduction with blue band 2x10 bil  Seated marches over cone x15, bilat  DF mobs and passive calf stretch  Lunges with DF block Lunges with towel under knee for distraction.  Updated HEP.  DATE: 08/25/22  NuStep: level 5x 6 minutes-PT present to discuss progress Seated hamstring stretch 3x20 seconds  Seated piriformis stretch 3x20 seconds  Seated hip abduction with blue band 2x10 bil  Seated marches with 2lb weight 2x10 bil  Sit to stand 2x10 Standing rockerboard x2 minutes Tandem stance 3x15 seconds bil Standing on balance pad: weight shift 3 ways x2 min each  6" step-ups: x10 Rt and Lt (fatigue on Lt)  4" step downs [id number], Lt x 5 DATE: 08/23/22  NuStep: level 5x 6 minutes-PT present to discuss progress Seated hamstring stretch 3x20 seconds  Seated piriformis stretch 3x20 seconds  Seated hip abduction with blue band 2x10 bil  Seated marches with 2lb weight 2x10 bil  2x 5 sit to stand for testing  Standing rockerboard x2 minutes Tandem stance 3x15 seconds bil Standing on balance pad: weight shift 3 ways x2 min each  6" step-ups: x10 Rt and Lt (fatigue on Lt)  4" step downs Rtx10, Lt x 5  DATE: 08/18/22  NuStep: level 5x 6 minutes-PT present to discuss progress Seated hamstring stretch 3x20 seconds  Seated piriformis stretch 3x20 seconds  Seated PPT x20  Seated heel raises Seated hip abduction with blue band 2x10 bil  Seated marches with 2lb weight 2x10 bil  15x sit to stand with blue band around knees. Tandem stance 3x15 seconds bil Standing on balance pad: weight shift 3 ways x2 min each  Standing hip abduction/ flexion, x10 each,  bilat  Step up to first step 10x each LE lead fwd/ lateral    PATIENT EDUCATION:  Education details: Access Code: [email or web address] Person educated: Patient Education method: Explanation, Demonstration, and Handouts Education comprehension: verbalized understanding and returned demonstration  HOME EXERCISE PROGRAM: Access Code: [email or web address] URL: [email or web address] Date: 08/25/2022 Prepared by: Tresa Endo  Exercises - Seated Hamstring Stretch  - 3 x daily - 7 x weekly - 1 sets - 3 reps - 20 hold - Seated Piriformis Stretch with Trunk Bend  - 3 x daily - 7 x weekly - 1 sets - 3 reps - 20 hold - Seated Heel Raise  - 2 x daily - 7 x weekly - 2 sets - 10 reps - Seated Hip Abduction with Resistance  - 2 x daily - 7 x weekly - 2 sets - 10 reps - Tandem Stance with Support  - 2 x daily - 7 x weekly - 1 sets - 3 reps - 15 hold - Tandem Walking Along Line  - 2 x daily - 7 x weekly - 1 sets - 10 reps - Alternating Step Taps with Counter Support  - 2 x daily - 7 x weekly - 2 sets - 10 reps - Side Stepping with Counter Support  - 2 x daily - 7 x weekly - 1 sets - 10 reps ASSESSMENT:  CLINICAL IMPRESSION: Pt reports that she feels stronger overall. Session with focus on Rt ankle mobility and Lt knee mobility due to reports of pain with squats  and descending stairs. Pt is more confident with standing balance tasks. Pt provided updated HEP with new mobility exercises. Plan to continue with current POC prior to D/C. Patient will benefit from skilled PT to address the below impairments and improve overall function.   OBJECTIVE IMPAIRMENTS: Abnormal gait, decreased activity tolerance, decreased endurance, difficulty walking, decreased ROM, decreased strength, increased muscle spasms, impaired flexibility, improper body mechanics, postural dysfunction, and pain.   ACTIVITY LIMITATIONS: lifting, standing, squatting, stairs, transfers, and locomotion level  PARTICIPATION LIMITATIONS: meal prep, cleaning,  community activity, and yard work  PERSONAL FACTORS: Past/current experiences and 3+ comorbidities: lumbar fusion, Lt knee OA, Rt knee TKA, RA  are also affecting patient's functional outcome.   REHAB POTENTIAL: Good  CLINICAL DECISION MAKING: Evolving/moderate complexity  EVALUATION COMPLEXITY: Moderate   GOALS: Goals reviewed with patient? Yes  SHORT TERM GOALS: Target date: 08/08/2022    Be independent in initial HEP Baseline: Goal status: MET  2.  Perform 5x sit to stand in < or = to 14 seconds to reduce falls risk  Baseline: 12.5 seconds (08/23/22)  Goal status: MET  3.  Improve LE strength and muscle recruitment to perform sit to stand with > = to 40% reduction in Lt knee pain Baseline: no pain with 5x sit to stand test (08/23/22) Goal status: INITIAL  4.  Verbalize falls risk and how to modify outdoor activities to reduce falls  Baseline:  Goal status: MET    LONG TERM GOALS: Target date: 09/09/2022    Be independent in advanced HEP Baseline:  Goal status: INITIAL  2.  Perform 5x sit to stand in < or = to 12 seconds to reduce falls risk  Baseline: 12.5 (08/23/22) Goal status: in progress   3.  Demonstrate 4+/5 bil hip strength to improve endurance and safety for community distances Baseline:  Goal status: INITIAL  4.  Improve LE strength to negotiate steps with step-over step pattern and use of 1 rail Baseline: able to do alternating pattern 25% of the time.  Goal status: IN PROGRESS  5.  Report no falls at home Baseline: none since start of PT (08/23/22) Goal status: in progress     PLAN:  PT FREQUENCY: 2x/week  PT DURATION: 8 weeks  PLANNED INTERVENTIONS: Therapeutic exercises, Therapeutic activity, Neuromuscular re-education, Balance training, Gait training, Patient/Family education, Self Care, Joint mobilization, Joint manipulation, Stair training, Aquatic Therapy, Dry Needling, Electrical stimulation, Spinal manipulation, Spinal mobilization,  Cryotherapy, Moist heat, Taping, Vasopneumatic device, Manual therapy, and Re-evaluation.  PLAN FOR NEXT SESSION:  work on balance and strength.  See how advanced HEP is going at home.  2 more weeks probable to address balance and LE strength   Royal HawthornSimone Runette Scifres PT, DPT 08/29/22  2:53 PM '

## 2022-08-25 NOTE — Therapy (Signed)
OUTPATIENT PHYSICAL THERAPY TREATMENT   Patient Name: Terry Shannon MRN: [id number] DOB:04/01/1956, 67 y.o., female Today's Date: 08/25/2022  END OF SESSION:  PT End of Session - 08/25/22 1224     Visit Number 8    Date for PT Re-Evaluation 09/05/22    Authorization Type Medicare    Progress Note Due on Visit 10    PT Start Time 1147    PT Stop Time [id number]    PT Time Calculation (min) 37 min    Activity Tolerance Patient tolerated treatment well    Behavior During Therapy WFL for tasks assessed/performed                    Past Medical History:  Diagnosis Date   Allergy    year-round, pt. states   Chronic lower back pain    CMC arthritis, thumb, degenerative [id number]   right   Complication of anesthesia    slow to wake up after gallbladder surgery   Depression    Eczema    ARMS AND HANDS   GERD (gastroesophageal reflux disease)    History of thyroid cancer    Hypothyroidism    Migraines    Osteoarthritis    Overactive bladder    RA (rheumatoid arthritis)    Sleep apnea    no CPAP use   Squamous cell carcinoma of skin 12/21/2017   in situ-mid chest (CX35FU)   Ulcerative colitis    Past Surgical History:  Procedure Laterality Date   ABDOMINAL HERNIA REPAIR  123456   periumbilical ventral hernia/incisional hernia   BUNIONECTOMY Right    x 2 more   BUNIONECTOMY Left    BUNIONECTOMY WITH CHILECTOMY Right 12/16/2005   CARPAL TUNNEL RELEASE Right 07/13/2001   CARPAL TUNNEL RELEASE Left 08/10/2001   DILATION AND CURETTAGE OF UTERUS     HAMMER TOE SURGERY Left may 2014   HYSTEROSCOPY WITH D & C  03/01/2004   with exc. of endometrial polyp   KNEE ARTHROSCOPY Right 2013   LAPAROSCOPIC CHOLECYSTECTOMY  11/03/2006   LUMBAR FUSION  03/06/2014   l4  l5    nerve ablation lumbar      THYROIDECTOMY, PARTIAL Right prior to 2002   THYROIDECTOMY, PARTIAL Left 11/29/2000   and isthmus   TONSILLECTOMY  1961   TOTAL ANKLE REPLACEMENT Left 11/2016   with tendon  repair   TOTAL KNEE ARTHROPLASTY Right 04/01/2013   Procedure: RIGHT TOTAL KNEE ARTHROPLASTY;  Surgeon: Alta Corning, MD;  Location: [place] [facility];  Service: Orthopedics;  Laterality: Right;   Patient Active Problem List   Diagnosis Date Noted   Asthmatic bronchitis, mild persistent, uncomplicated [id number]   Upper airway cough syndrome 01/27/2022   OSA on CPAP 04/27/2021   Intolerance of continuous positive airway pressure (CPAP) ventilation 06/17/2020   OSA (obstructive sleep apnea) 05/06/2020   Snoring 05/06/2020   Insomnia secondary to chronic pain 03/24/2020   History of sleep apnea 03/24/2020   Gasping for breath 03/24/2020   Abnormal glucose level 12/16/2019   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   Degeneration of lumbar intervertebral disc 12/16/2019   Hyperlipidemia 12/16/2019   Hypothyroidism 12/16/2019   Insomnia 12/16/2019   Migraine 12/16/2019   Overweight 12/16/2019   Postoperative hypothyroidism 12/16/2019   Vitamin D deficiency 12/16/2019   Hallux rigidus, right foot 12/05/2019   Hammer toe of right foot 12/05/2019   History of left ankle joint replacement 12/05/2019   Aftercare following ankle joint replacement surgery 01/10/2017   Anxiety, mild  12/12/2016   Atypical chest pain 12/12/2016   Cardiac murmur 12/12/2016   GERD (gastroesophageal reflux disease) 12/12/2016   History of thyroid cancer 12/12/2016   Post-traumatic osteoarthritis of left ankle 10/03/2016   Chronic migraine without aura, with intractable migraine, so stated, with status migrainosus 09/26/2015   Joint pain 02/18/2015   Abnormal C-reactive protein 01/08/2015   Radiculopathy 03/06/2014   Osteoarthritis of right knee 04/01/2013   OA (osteoarthritis) 11/01/2011   OAB (overactive bladder)    Ulcerative colitis    Cancer    Carpal tunnel syndrome    RA (rheumatoid arthritis)     PCP: Willey Blade, MD  REFERRING PROVIDER: Willey Blade, MD  REFERRING DIAG: M25.50 (ICD-10-CM) -  Pain in unspecified joint   Rationale for Evaluation and Treatment: Rehabilitation  THERAPY DIAG:  Muscle weakness (generalized)  Other low back pain  Other abnormalities of gait and mobility  ONSET DATE: 6 months ago  SUBJECTIVE:                                                                                                                                                                                           SUBJECTIVE STATEMENT: I feel a little stronger.   PERTINENT HISTORY:  Lumbar fusion 2015, LBP, Lt total ankle replacement 2018, Rt TKA 2014, migraines, RA  PAIN:  No pain reported today  PRECAUTIONS: None  WEIGHT BEARING RESTRICTIONS: No  FALLS:  Has patient fallen in last 6 months? Yes. Number of falls 4. PT will address balance.   LIVING ENVIRONMENT: Lives with: lives with their family and lives with their partner Lives in: House/apartment Stairs: Yes- uses rail Has following equipment at home: None  OCCUPATION: retired, walks dog  PLOF: Independent  PATIENT GOALS: improve balance, reduce falls risk, improve strength/endurance Walking limited to 30-35 minutes.   NEXT MD VISIT: end of February  OBJECTIVE:   DIAGNOSTIC FINDINGS:  none  COGNITION: Overall cognitive status: Within functional limits for tasks assessed     SENSATION: WFL  MUSCLE LENGTH: Hip flexibility WFLS without pain  POSTURE: forward head  PALPATION: No palpable tenderness  LUMBAR ROM:  WFLs without pain  LOWER EXTREMITY ROM:    WFLs without pain  LOWER EXTREMITY MMT:   Bil hips 4/5, knees 4+/5, ankles 4=/5  FUNCTIONAL TESTS:  5 times sit to stand: 18.79 without UE support-Lt knee pain with this 08/10/22: 16.90 seconds without hands and good eccentric control.  08/23/22: 12.5 seconds without hands and good control.    GAIT: Distance walked: 100 Assistive device utilized: None Level of assistance: Complete Independence Comments: mild scissoring and reduced glute  med activation bilaterally  Steps: step to gait with use of rail due to Lt knee pain  TODAY'S TREATMENT:    DATE: 08/25/22  NuStep: level 5x 6 minutes-PT present to discuss progress Seated hamstring stretch 3x20 seconds  Seated piriformis stretch 3x20 seconds  Seated hip abduction with blue band 2x10 bil  Seated marches with 2lb weight 2x10 bil  Sit to stand 2x10 Standing rockerboard x2 minutes Tandem stance 3x15 seconds bil Standing on balance pad: weight shift 3 ways x2 min each  6" step-ups: x10 Rt and Lt (fatigue on Lt)  4" step downs [id number], Lt x 5 DATE: 08/23/22  NuStep: level 5x 6 minutes-PT present to discuss progress Seated hamstring stretch 3x20 seconds  Seated piriformis stretch 3x20 seconds  Seated hip abduction with blue band 2x10 bil  Seated marches with 2lb weight 2x10 bil  2x 5 sit to stand for testing  Standing rockerboard x2 minutes Tandem stance 3x15 seconds bil Standing on balance pad: weight shift 3 ways x2 min each  6" step-ups: x10 Rt and Lt (fatigue on Lt)  4" step downs [email or web address], Lt x 5  DATE: 08/18/22  NuStep: level 5x 6 minutes-PT present to discuss progress Seated hamstring stretch 3x20 seconds  Seated piriformis stretch 3x20 seconds  Seated PPT x20  Seated heel raises Seated hip abduction with blue band 2x10 bil  Seated marches with 2lb weight 2x10 bil  15x sit to stand with blue band around knees. Tandem stance 3x15 seconds bil Standing on balance pad: weight shift 3 ways x2 min each  Standing hip abduction/ flexion, x10 each, bilat  Step up to first step 10x each LE lead fwd/ lateral    PATIENT EDUCATION:  Education details: Access Code: [email or web address] Person educated: Patient Education method: Explanation, Demonstration, and Handouts Education comprehension: verbalized understanding and returned demonstration  HOME EXERCISE PROGRAM: Access Code: [email or web address] URL: [email or web address] Date: 08/25/2022 Prepared by: Claiborne Billings  Exercises -  Seated Hamstring Stretch  - 3 x daily - 7 x weekly - 1 sets - 3 reps - 20 hold - Seated Piriformis Stretch with Trunk Bend  - 3 x daily - 7 x weekly - 1 sets - 3 reps - 20 hold - Seated Heel Raise  - 2 x daily - 7 x weekly - 2 sets - 10 reps - Seated Hip Abduction with Resistance  - 2 x daily - 7 x weekly - 2 sets - 10 reps - Tandem Stance with Support  - 2 x daily - 7 x weekly - 1 sets - 3 reps - 15 hold - Tandem Walking Along Line  - 2 x daily - 7 x weekly - 1 sets - 10 reps - Alternating Step Taps with Counter Support  - 2 x daily - 7 x weekly - 2 sets - 10 reps - Side Stepping with Counter Support  - 2 x daily - 7 x weekly - 1 sets - 10 reps ASSESSMENT:  CLINICAL IMPRESSION: Pt reports that she feels stronger overall.  5x sit to stand is 12.5 seconds without UE support this week, indicating improved balance overall.  Pt is more confident with standing balance tasks. Pt did well with addition of dynamic balance tasks today and PT added to HEP. PT provided cueing, stand by assistance and monitored for pain.  Patient will benefit from skilled PT to address the below impairments and improve overall function.   OBJECTIVE IMPAIRMENTS: Abnormal gait, decreased activity tolerance, decreased endurance, difficulty walking, decreased ROM, decreased strength, increased muscle spasms, impaired flexibility,  improper body mechanics, postural dysfunction, and pain.   ACTIVITY LIMITATIONS: lifting, standing, squatting, stairs, transfers, and locomotion level  PARTICIPATION LIMITATIONS: meal prep, cleaning, community activity, and yard work  PERSONAL FACTORS: Past/current experiences and 3+ comorbidities: lumbar fusion, Lt knee OA, Rt knee TKA, RA  are also affecting patient's functional outcome.   REHAB POTENTIAL: Good  CLINICAL DECISION MAKING: Evolving/moderate complexity  EVALUATION COMPLEXITY: Moderate   GOALS: Goals reviewed with patient? Yes  SHORT TERM GOALS: Target date: 08/08/2022    Be  independent in initial HEP Baseline: Goal status: MET  2.  Perform 5x sit to stand in < or = to 14 seconds to reduce falls risk  Baseline: 12.5 seconds (08/23/22)  Goal status: MET  3.  Improve LE strength and muscle recruitment to perform sit to stand with > = to 40% reduction in Lt knee pain Baseline: no pain with 5x sit to stand test (08/23/22) Goal status: INITIAL  4.  Verbalize falls risk and how to modify outdoor activities to reduce falls  Baseline:  Goal status: MET    LONG TERM GOALS: Target date: 09/09/2022    Be independent in advanced HEP Baseline:  Goal status: INITIAL  2.  Perform 5x sit to stand in < or = to 12 seconds to reduce falls risk  Baseline: 12.5 (08/23/22) Goal status: in progress   3.  Demonstrate 4+/5 bil hip strength to improve endurance and safety for community distances Baseline:  Goal status: INITIAL  4.  Improve LE strength to negotiate steps with step-over step pattern and use of 1 rail Baseline: able to do alternating pattern 25% of the time.  Goal status: IN PROGRESS  5.  Report no falls at home Baseline: none since start of PT (08/23/22) Goal status: in progress     PLAN:  PT FREQUENCY: 2x/week  PT DURATION: 8 weeks  PLANNED INTERVENTIONS: Therapeutic exercises, Therapeutic activity, Neuromuscular re-education, Balance training, Gait training, Patient/Family education, Self Care, Joint mobilization, Joint manipulation, Stair training, Aquatic Therapy, Dry Needling, Electrical stimulation, Spinal manipulation, Spinal mobilization, Cryotherapy, Moist heat, Taping, Vasopneumatic device, Manual therapy, and Re-evaluation.  PLAN FOR NEXT SESSION:  work on balance and strength.  See how advanced HEP is going at home.  2 more weeks probable to address balance and LE strength   Sigurd Sos, PT 08/25/22 12:30 PM

## 2022-08-29 ENCOUNTER — Encounter: Payer: Self-pay | Admitting: Physical Therapy

## 2022-08-29 ENCOUNTER — Ambulatory Visit: Payer: Medicare Other | Admitting: Physical Therapy

## 2022-08-29 DIAGNOSIS — M5459 Other low back pain: Secondary | ICD-10-CM | POA: Diagnosis not present

## 2022-08-29 DIAGNOSIS — M6281 Muscle weakness (generalized): Secondary | ICD-10-CM

## 2022-08-29 DIAGNOSIS — R2689 Other abnormalities of gait and mobility: Secondary | ICD-10-CM | POA: Diagnosis not present

## 2022-08-29 DIAGNOSIS — G8929 Other chronic pain: Secondary | ICD-10-CM | POA: Diagnosis not present

## 2022-08-29 DIAGNOSIS — H04123 Dry eye syndrome of bilateral lacrimal glands: Secondary | ICD-10-CM | POA: Diagnosis not present

## 2022-08-29 DIAGNOSIS — H16213 Exposure keratoconjunctivitis, bilateral: Secondary | ICD-10-CM | POA: Diagnosis not present

## 2022-08-29 DIAGNOSIS — M25562 Pain in left knee: Secondary | ICD-10-CM | POA: Diagnosis not present

## 2022-08-29 DIAGNOSIS — S0502XA Injury of conjunctiva and corneal abrasion without foreign body, left eye, initial encounter: Secondary | ICD-10-CM | POA: Diagnosis not present

## 2022-08-29 DIAGNOSIS — R252 Cramp and spasm: Secondary | ICD-10-CM | POA: Diagnosis not present

## 2022-09-05 ENCOUNTER — Ambulatory Visit: Payer: Medicare Other

## 2022-09-05 DIAGNOSIS — R2689 Other abnormalities of gait and mobility: Secondary | ICD-10-CM | POA: Diagnosis not present

## 2022-09-05 DIAGNOSIS — M5459 Other low back pain: Secondary | ICD-10-CM

## 2022-09-05 DIAGNOSIS — M6281 Muscle weakness (generalized): Secondary | ICD-10-CM | POA: Diagnosis not present

## 2022-09-05 DIAGNOSIS — R252 Cramp and spasm: Secondary | ICD-10-CM | POA: Diagnosis not present

## 2022-09-05 DIAGNOSIS — G8929 Other chronic pain: Secondary | ICD-10-CM | POA: Diagnosis not present

## 2022-09-05 DIAGNOSIS — H16213 Exposure keratoconjunctivitis, bilateral: Secondary | ICD-10-CM | POA: Diagnosis not present

## 2022-09-05 DIAGNOSIS — S0502XA Injury of conjunctiva and corneal abrasion without foreign body, left eye, initial encounter: Secondary | ICD-10-CM | POA: Diagnosis not present

## 2022-09-05 DIAGNOSIS — H04123 Dry eye syndrome of bilateral lacrimal glands: Secondary | ICD-10-CM | POA: Diagnosis not present

## 2022-09-05 DIAGNOSIS — M25562 Pain in left knee: Secondary | ICD-10-CM | POA: Diagnosis not present

## 2022-09-05 NOTE — Therapy (Signed)
OUTPATIENT PHYSICAL THERAPY TREATMENT   Patient Name: Terry Shannon MRN: [id number] DOB:[birth year], 67 y.o., female Today's Date: 09/05/2022 Progress Note Reporting Period 07/11/22 to 09/05/22  See note below for Objective Data and Assessment of Progress/Goals.     END OF SESSION:  PT End of Session - 09/05/22 1445     Visit Number 10    Date for PT Re-Evaluation 09/05/22    Authorization Type Medicare    Progress Note Due on Visit 10    PT Start Time 1400    PT Stop Time 1443    PT Time Calculation (min) 43 min    Activity Tolerance Patient tolerated treatment well    Behavior During Therapy WFL for tasks assessed/performed              Past Medical History:  Diagnosis Date   Allergy    year-round, pt. 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   right   Complication of anesthesia    slow to wake up after gallbladder surgery   Depression    Eczema    ARMS AND HANDS   GERD (gastroesophageal reflux disease)    History of thyroid cancer    Hypothyroidism    Migraines    Osteoarthritis    Overactive bladder    RA (rheumatoid arthritis)    Sleep apnea    no CPAP use   Squamous cell carcinoma of skin 12/21/2017   in situ-mid chest (CX35FU)   Ulcerative colitis    Past Surgical History:  Procedure Laterality Date   ABDOMINAL HERNIA REPAIR  03/13/2008   periumbilical ventral hernia/incisional hernia   BUNIONECTOMY Right    x 2 more   BUNIONECTOMY Left    BUNIONECTOMY WITH CHILECTOMY Right 12/16/2005   CARPAL TUNNEL RELEASE Right 07/13/2001   CARPAL TUNNEL RELEASE Left 08/10/2001   DILATION AND CURETTAGE OF UTERUS     HAMMER TOE SURGERY Left may 2014   HYSTEROSCOPY WITH D & C  03/01/2004   with exc. of endometrial polyp   KNEE ARTHROSCOPY Right 2013   LAPAROSCOPIC CHOLECYSTECTOMY  11/03/2006   LUMBAR FUSION  03/06/2014   l4  l5    nerve ablation lumbar      THYROIDECTOMY, PARTIAL Right prior to 2002   THYROIDECTOMY,  PARTIAL Left 11/29/2000   and isthmus   TONSILLECTOMY  1961   TOTAL ANKLE REPLACEMENT Left 11/2016   with tendon repair   TOTAL KNEE ARTHROPLASTY Right 04/01/2013   Procedure: RIGHT TOTAL KNEE ARTHROPLASTY;  Surgeon: Harvie Junior, MD;  Location: [facility];  Service: Orthopedics;  Laterality: Right;   Patient Active Problem List   Diagnosis Date Noted   Asthmatic bronchitis, mild persistent, uncomplicated 01/27/2022   Upper airway cough syndrome 01/27/2022   OSA on CPAP 04/27/2021   Intolerance of continuous positive airway pressure (CPAP) ventilation 06/17/2020   OSA (obstructive sleep apnea) 05/06/2020   Snoring 05/06/2020   Insomnia secondary to chronic pain 03/24/2020   History of sleep apnea 03/24/2020   Gasping for breath 03/24/2020   Abnormal glucose level 12/16/2019   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   Degeneration of lumbar intervertebral disc 12/16/2019   Hyperlipidemia 12/16/2019   Hypothyroidism 12/16/2019   Insomnia 12/16/2019   Migraine 12/16/2019   Overweight 12/16/2019   Postoperative hypothyroidism 12/16/2019   Vitamin D deficiency 12/16/2019   Hallux rigidus, right foot 12/05/2019   Hammer toe of right foot 12/05/2019   History of left ankle joint  replacement 12/05/2019   Aftercare following ankle joint replacement surgery 01/10/2017   Anxiety, mild 12/12/2016   Atypical chest pain 12/12/2016   Cardiac murmur 12/12/2016   GERD (gastroesophageal reflux disease) 12/12/2016   History of thyroid cancer 12/12/2016   Post-traumatic osteoarthritis of left ankle 10/03/2016   Chronic migraine without aura, with intractable migraine, so stated, with status migrainosus 09/26/2015   Joint pain 02/18/2015   Abnormal C-reactive protein 01/08/2015   Radiculopathy 03/06/2014   Osteoarthritis of right knee 04/01/2013   OA (osteoarthritis) 11/01/2011   OAB (overactive bladder)    Ulcerative colitis    Cancer    Carpal tunnel syndrome    RA (rheumatoid arthritis)      PCP: Andi Devon, MD  REFERRING PROVIDER: Andi Devon, MD  REFERRING DIAG: M25.50 (ICD-10-CM) - Pain in unspecified joint   Rationale for Evaluation and Treatment: Rehabilitation  THERAPY DIAG:  Muscle weakness (generalized)  Other abnormalities of gait and mobility  Other low back pain  Chronic pain of left knee  Cramp and spasm  ONSET DATE: 6 months ago  SUBJECTIVE:                                                                                                                                                                                           SUBJECTIVE STATEMENT: I had pain in my Lt knee and ankle after we did new stuff so I haven't been doing much exercise since I've been here.  I strained my back bringing stuff into my house.    PERTINENT HISTORY:  Lumbar fusion 2015, LBP, Lt total ankle replacement 2018, Rt TKA 2014, migraines, RA  PAIN:  No pain reported today  PRECAUTIONS: None  WEIGHT BEARING RESTRICTIONS: No  FALLS:  Has patient fallen in last 6 months? Yes. Number of falls 4. PT will address balance.   LIVING ENVIRONMENT: Lives with: lives with their family and lives with their partner Lives in: House/apartment Stairs: Yes- uses rail Has following equipment at home: None  OCCUPATION: retired, walks dog  PLOF: Independent  PATIENT GOALS: improve balance, reduce falls risk, improve strength/endurance Walking limited to 30-35 minutes.   NEXT MD VISIT: end of February  OBJECTIVE:   DIAGNOSTIC FINDINGS:  none  COGNITION: Overall cognitive status: Within functional limits for tasks assessed     SENSATION: WFL  MUSCLE LENGTH: Hip flexibility WFLS without pain  POSTURE: forward head  PALPATION: No palpable tenderness  LUMBAR ROM:  WFLs without pain  LOWER EXTREMITY ROM:    WFLs without pain  LOWER EXTREMITY MMT:   Bil hips 4/5, knees 4+/5, ankles 4=/5  FUNCTIONAL TESTS:  5  times sit to stand: 18.79 without UE  support-Lt knee pain with this 08/10/22: 16.90 seconds without hands and good eccentric control.  08/23/22: 12.5 seconds without hands and good control.   09/05/22: 11.65 seconds without hands   GAIT: Distance walked: 100 Assistive device utilized: None Level of assistance: Complete Independence Comments: mild scissoring and reduced glute med activation bilaterally Steps: step to gait with use of rail due to Lt knee pain  TODAY'S TREATMENT:    DATE: 09/05/22  NuStep: level 5x 6 minutes-PT present to discuss progress Seated hamstring stretch 3x20 seconds  Seated piriformis stretch 3x20 seconds  Calf stretch 4x30 sec with IV on second set  Seated hip abduction with blue band 2x10 bil  Sit to stand 2x5 reps  Tandem stance Rt and Lt x 20 seconds  Alternating step taps 6" x20 with min to no UE support    DATE: 08/29/22  NuStep: level 5x 6 minutes-PT present to discuss progress Seated hamstring stretch 3x20 seconds  Seated piriformis stretch 3x20 seconds  Calf stretch  Seated hip abduction with blue band 2x10 bil  Seated marches over cone x15, bilat  DF mobs and passive calf stretch  Lunges with DF block Lunges with towel under knee for distraction.  Updated HEP.   DATE: 08/25/22  NuStep: level 5x 6 minutes-PT present to discuss progress Seated hamstring stretch 3x20 seconds  Seated piriformis stretch 3x20 seconds  Seated hip abduction with blue band 2x10 bil  Seated marches with 2lb weight 2x10 bil  Sit to stand 2x10 Standing rockerboard x2 minutes Tandem stance 3x15 seconds bil Standing on balance pad: weight shift 3 ways x2 min each  6" step-ups: x10 Rt and Lt (fatigue on Lt)  4" step downs [id number], Lt x 5  PATIENT EDUCATION:  Education details: Access Code: [email or web address] Person educated: Patient Education method: Explanation, Demonstration, and Handouts Education comprehension: verbalized understanding and returned demonstration  HOME EXERCISE PROGRAM: Access Code:  [email or web address] URL: [email or web address] Date: 08/25/2022 Prepared by: Tresa Endo  Exercises - Seated Hamstring Stretch  - 3 x daily - 7 x weekly - 1 sets - 3 reps - 20 hold - Seated Piriformis Stretch with Trunk Bend  - 3 x daily - 7 x weekly - 1 sets - 3 reps - 20 hold - Seated Heel Raise  - 2 x daily - 7 x weekly - 2 sets - 10 reps - Seated Hip Abduction with Resistance  - 2 x daily - 7 x weekly - 2 sets - 10 reps - Tandem Stance with Support  - 2 x daily - 7 x weekly - 1 sets - 3 reps - 15 hold - Tandem Walking Along Line  - 2 x daily - 7 x weekly - 1 sets - 10 reps - Alternating Step Taps with Counter Support  - 2 x daily - 7 x weekly - 2 sets - 10 reps - Side Stepping with Counter Support  - 2 x daily - 7 x weekly - 1 sets - 10 reps ASSESSMENT:  CLINICAL IMPRESSION: Pt reports ankle and knee pain after last session and hasn't done any exercise since.  PT educated pt to continue to exercise with modifications when she has pain.  Pt tolerated all exercise in the clinic today without increased pain.  5x sit to stand is 11 seconds, meeting goal.  Patient will benefit from skilled PT to address the below impairments and improve overall function.   OBJECTIVE IMPAIRMENTS: Abnormal gait, decreased activity tolerance, decreased endurance, difficulty  walking, decreased ROM, decreased strength, increased muscle spasms, impaired flexibility, improper body mechanics, postural dysfunction, and pain.   ACTIVITY LIMITATIONS: lifting, standing, squatting, stairs, transfers, and locomotion level  PARTICIPATION LIMITATIONS: meal prep, cleaning, community activity, and yard work  PERSONAL FACTORS: Past/current experiences and 3+ comorbidities: lumbar fusion, Lt knee OA, Rt knee TKA, RA  are also affecting patient's functional outcome.   REHAB POTENTIAL: Good  CLINICAL DECISION MAKING: Evolving/moderate complexity  EVALUATION COMPLEXITY: Moderate   GOALS: Goals reviewed with patient?  Yes  SHORT TERM GOALS: Target date: 08/08/2022    Be independent in initial HEP Baseline: Goal status: MET  2.  Perform 5x sit to stand in < or = to 14 seconds to reduce falls risk  Baseline: 12.5 seconds (08/23/22)  Goal status: MET  3.  Improve LE strength and muscle recruitment to perform sit to stand with > = to 40% reduction in Lt knee pain Baseline: no pain with 5x sit to stand test (08/23/22) Goal status: MET  4.  Verbalize falls risk and how to modify outdoor activities to reduce falls  Baseline:  Goal status: MET    LONG TERM GOALS: Target date: 09/09/2022    Be independent in advanced HEP Baseline:  Goal status: INITIAL  2.  Perform 5x sit to stand in < or = to 12 seconds to reduce falls risk  Baseline: 11 seconds (09/05/22) Goal status: MET  3.  Demonstrate 4+/5 bil hip strength to improve endurance and safety for community distances Baseline:  Goal status: INITIAL  4.  Improve LE strength to negotiate steps with step-over step pattern and use of 1 rail Baseline: able to do alternating pattern 25% of the time (09/05/22) Goal status: NOT MET  5.  Report no falls at home Baseline: none since start of PT (08/23/22) Goal status: in progress     PLAN:  PT FREQUENCY: 2x/week  PT DURATION: 8 weeks  PLANNED INTERVENTIONS: Therapeutic exercises, Therapeutic activity, Neuromuscular re-education, Balance training, Gait training, Patient/Family education, Self Care, Joint mobilization, Joint manipulation, Stair training, Aquatic Therapy, Dry Needling, Electrical stimulation, Spinal manipulation, Spinal mobilization, Cryotherapy, Moist heat, Taping, Vasopneumatic device, Manual therapy, and Re-evaluation.  PLAN FOR NEXT SESSION:  work on balance and strength.  See how advanced HEP is going at home.  D/C next visit.   Lorrene Reid, PT 09/05/22 2:46 PM  '

## 2022-09-07 ENCOUNTER — Ambulatory Visit: Payer: Medicare Other

## 2022-09-07 DIAGNOSIS — M5459 Other low back pain: Secondary | ICD-10-CM

## 2022-09-07 DIAGNOSIS — M25562 Pain in left knee: Secondary | ICD-10-CM | POA: Diagnosis not present

## 2022-09-07 DIAGNOSIS — G8929 Other chronic pain: Secondary | ICD-10-CM | POA: Diagnosis not present

## 2022-09-07 DIAGNOSIS — R252 Cramp and spasm: Secondary | ICD-10-CM | POA: Diagnosis not present

## 2022-09-07 DIAGNOSIS — M6281 Muscle weakness (generalized): Secondary | ICD-10-CM

## 2022-09-07 DIAGNOSIS — R2689 Other abnormalities of gait and mobility: Secondary | ICD-10-CM | POA: Diagnosis not present

## 2022-09-07 NOTE — Therapy (Signed)
OUTPATIENT PHYSICAL THERAPY TREATMENT   Patient Name: Terry Shannon MRN: [id number] DOB:Jul 06, 1955, 67 y.o., female Today's Date: 09/07/2022   END OF SESSION:  PT End of Session - 09/07/22 1407     Visit Number 11    PT Start Time 1401    PT Stop Time 1436    PT Time Calculation (min) 35 min    Activity Tolerance Patient tolerated treatment well    Behavior During Therapy WFL for tasks assessed/performed               Past Medical History:  Diagnosis Date   Allergy    year-round, pt. 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   right   Complication of anesthesia    slow to wake up after gallbladder surgery   Depression    Eczema    ARMS AND HANDS   GERD (gastroesophageal reflux disease)    History of thyroid cancer    Hypothyroidism    Migraines    Osteoarthritis    Overactive bladder    RA (rheumatoid arthritis)    Sleep apnea    no CPAP use   Squamous cell carcinoma of skin 12/21/2017   in situ-mid chest (CX35FU)   Ulcerative colitis    Past Surgical History:  Procedure Laterality Date   ABDOMINAL HERNIA REPAIR  03/13/2008   periumbilical ventral hernia/incisional hernia   BUNIONECTOMY Right    x 2 more   BUNIONECTOMY Left    BUNIONECTOMY WITH CHILECTOMY Right 12/16/2005   CARPAL TUNNEL RELEASE Right 07/13/2001   CARPAL TUNNEL RELEASE Left 08/10/2001   DILATION AND CURETTAGE OF UTERUS     HAMMER TOE SURGERY Left may 2014   HYSTEROSCOPY WITH D & C  03/01/2004   with exc. of endometrial polyp   KNEE ARTHROSCOPY Right 2013   LAPAROSCOPIC CHOLECYSTECTOMY  11/03/2006   LUMBAR FUSION  03/06/2014   l4  l5    nerve ablation lumbar      THYROIDECTOMY, PARTIAL Right prior to 2002   THYROIDECTOMY, PARTIAL Left 11/29/2000   and isthmus   TONSILLECTOMY  1961   TOTAL ANKLE REPLACEMENT Left 11/2016   with tendon repair   TOTAL KNEE ARTHROPLASTY Right 04/01/2013   Procedure: RIGHT TOTAL KNEE ARTHROPLASTY;  Surgeon: Harvie Junior, MD;  Location: [facility] OR;  Service: Orthopedics;  Laterality: Right;   Patient Active Problem List   Diagnosis Date Noted   Asthmatic bronchitis, mild persistent, uncomplicated 01/27/2022   Upper airway cough syndrome 01/27/2022   OSA on CPAP 04/27/2021   Intolerance of continuous positive airway pressure (CPAP) ventilation 06/17/2020   OSA (obstructive sleep apnea) 05/06/2020   Snoring 05/06/2020   Insomnia secondary to chronic pain 03/24/2020   History of sleep apnea 03/24/2020   Gasping for breath 03/24/2020   Abnormal glucose level 12/16/2019   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   Degeneration of lumbar intervertebral disc 12/16/2019   Hyperlipidemia 12/16/2019   Hypothyroidism 12/16/2019   Insomnia 12/16/2019   Migraine 12/16/2019   Overweight 12/16/2019   Postoperative hypothyroidism 12/16/2019   Vitamin D deficiency 12/16/2019   Hallux rigidus, right foot 12/05/2019   Hammer toe of right foot 12/05/2019   History of left ankle joint replacement 12/05/2019   Aftercare following ankle joint replacement surgery 01/10/2017   Anxiety, mild 12/12/2016   Atypical chest pain 12/12/2016   Cardiac murmur 12/12/2016   GERD (gastroesophageal reflux disease) 12/12/2016   History of thyroid cancer 12/12/2016  Post-traumatic osteoarthritis of left ankle 10/03/2016   Chronic migraine without aura, with intractable migraine, so stated, with status migrainosus 09/26/2015   Joint pain 02/18/2015   Abnormal C-reactive protein 01/08/2015   Radiculopathy 03/06/2014   Osteoarthritis of right knee 04/01/2013   OA (osteoarthritis) 11/01/2011   OAB (overactive bladder)    Ulcerative colitis    Cancer    Carpal tunnel syndrome    RA (rheumatoid arthritis)     PCP: Andi Devon, MD  REFERRING PROVIDER: Andi Devon, MD  REFERRING DIAG: M25.50 (ICD-10-CM) - Pain in unspecified joint   Rationale for Evaluation and Treatment: Rehabilitation  THERAPY DIAG:  Muscle  weakness (generalized)  Other abnormalities of gait and mobility  Other low back pain  ONSET DATE: 6 months ago  SUBJECTIVE:                                                                                                                                                                                           SUBJECTIVE STATEMENT: I'm ready to D/C.  I have my exercises.     PERTINENT HISTORY:  Lumbar fusion 2015, LBP, Lt total ankle replacement 2018, Rt TKA 2014, migraines, RA  PAIN:  No pain reported today  PRECAUTIONS: None  WEIGHT BEARING RESTRICTIONS: No  FALLS:  Has patient fallen in last 6 months? Yes. Number of falls 4. PT will address balance.   LIVING ENVIRONMENT: Lives with: lives with their family and lives with their partner Lives in: House/apartment Stairs: Yes- uses rail Has following equipment at home: None  OCCUPATION: retired, walks dog  PLOF: Independent  PATIENT GOALS: improve balance, reduce falls risk, improve strength/endurance Walking limited to 30-35 minutes.   NEXT MD VISIT: end of February  OBJECTIVE:   DIAGNOSTIC FINDINGS:  none  COGNITION: Overall cognitive status: Within functional limits for tasks assessed     SENSATION: WFL  MUSCLE LENGTH: Hip flexibility WFLS without pain  POSTURE: forward head  PALPATION: No palpable tenderness  LUMBAR ROM:  WFLs without pain  LOWER EXTREMITY ROM:    WFLs without pain  LOWER EXTREMITY MMT:   Bil hips 4/5, knees 4+/5, ankles 4+/5 09/07/22: bil hip flexion, abduction IR and ER 4+/5  FUNCTIONAL TESTS:  5 times sit to stand: 18.79 without UE support-Lt knee pain with this 08/10/22: 16.90 seconds without hands and good eccentric control.  08/23/22: 12.5 seconds without hands and good control.   09/05/22: 11.65 seconds without hands   GAIT: Distance walked: 100 Assistive device utilized: None Level of assistance: Complete Independence Comments: mild scissoring and reduced glute med  activation bilaterally Steps: step to gait with use of  rail due to Lt knee pain  TODAY'S TREATMENT:    DATE: 09/07/22  NuStep: level 5x 6 minutes-PT present to discuss progress Seated hamstring stretch 3x20 seconds  Seated piriformis stretch 3x20 seconds  Rockerboard in standing x 3 minutes  Seated hip abduction with blue band 2x10 bil  Sit to stand 2x5 reps  Tandem stance Rt and Lt x 20 seconds  Alternating step taps 6" x20 with min to no UE support   DATE: 09/05/22  NuStep: level 5x 6 minutes-PT present to discuss progress Seated hamstring stretch 3x20 seconds  Seated piriformis stretch 3x20 seconds  Calf stretch 4x30 sec with IV on second set  Seated hip abduction with blue band 2x10 bil  Sit to stand 2x5 reps  Tandem stance Rt and Lt x 20 seconds  Alternating step taps 6" x20 with min to no UE support    DATE: 08/29/22  NuStep: level 5x 6 minutes-PT present to discuss progress Seated hamstring stretch 3x20 seconds  Seated piriformis stretch 3x20 seconds  Calf stretch  Seated hip abduction with blue band 2x10 bil  Seated marches over cone x15, bilat  DF mobs and passive calf stretch  Lunges with DF block Lunges with towel under knee for distraction.  Updated HEP.   PATIENT EDUCATION:  Education details: Access Code: [email or web address] Person educated: Patient Education method: Explanation, Demonstration, and Handouts Education comprehension: verbalized understanding and returned demonstration  HOME EXERCISE PROGRAM: Access Code: [email or web address] URL: [email or web address] Date: 08/25/2022 Prepared by: Tresa Endo  Exercises - Seated Hamstring Stretch  - 3 x daily - 7 x weekly - 1 sets - 3 reps - 20 hold - Seated Piriformis Stretch with Trunk Bend  - 3 x daily - 7 x weekly - 1 sets - 3 reps - 20 hold - Seated Heel Raise  - 2 x daily - 7 x weekly - 2 sets - 10 reps - Seated Hip Abduction with Resistance  - 2 x daily - 7 x weekly - 2 sets - 10 reps - Tandem Stance with Support   - 2 x daily - 7 x weekly - 1 sets - 3 reps - 15 hold - Tandem Walking Along Line  - 2 x daily - 7 x weekly - 1 sets - 10 reps - Alternating Step Taps with Counter Support  - 2 x daily - 7 x weekly - 2 sets - 10 reps - Side Stepping with Counter Support  - 2 x daily - 7 x weekly - 1 sets - 10 reps ASSESSMENT:  CLINICAL IMPRESSION: Pt is ready to D/C to HEP.  Pt will continue to work on exercises for flexibility, strength and balance.    OBJECTIVE IMPAIRMENTS: Abnormal gait, decreased activity tolerance, decreased endurance, difficulty walking, decreased ROM, decreased strength, increased muscle spasms, impaired flexibility, improper body mechanics, postural dysfunction, and pain.   ACTIVITY LIMITATIONS: lifting, standing, squatting, stairs, transfers, and locomotion level  PARTICIPATION LIMITATIONS: meal prep, cleaning, community activity, and yard work  PERSONAL FACTORS: Past/current experiences and 3+ comorbidities: lumbar fusion, Lt knee OA, Rt knee TKA, RA  are also affecting patient's functional outcome.   REHAB POTENTIAL: Good  CLINICAL DECISION MAKING: Evolving/moderate complexity  EVALUATION COMPLEXITY: Moderate   GOALS: Goals reviewed with patient? Yes  SHORT TERM GOALS: Target date: 08/08/2022    Be independent in initial HEP Baseline: Goal status: MET  2.  Perform 5x sit to stand in < or = to 14 seconds to reduce falls risk  Baseline: 12.5 seconds (08/23/22)  Goal status: MET  3.  Improve LE strength and muscle recruitment to perform sit to stand with > = to 40% reduction in Lt knee pain Baseline: no pain with 5x sit to stand test (08/23/22) Goal status: MET  4.  Verbalize falls risk and how to modify outdoor activities to reduce falls  Baseline:  Goal status: MET    LONG TERM GOALS: Target date: 09/09/2022    Be independent in advanced HEP Baseline:  Goal status: MET  2.  Perform 5x sit to stand in < or = to 12 seconds to reduce falls risk  Baseline: 11  seconds (09/05/22) Goal status: MET  3.  Demonstrate 4+/5 bil hip strength to improve endurance and safety for community distances Baseline: see above (09/07/22) Goal status: MET  4.  Improve LE strength to negotiate steps with step-over step pattern and use of 1 rail Baseline: able to do alternating pattern 25% of the time (09/05/22) Goal status: NOT MET  5.  Report no falls at home Baseline: none since start of PT (09/07/22) Goal status:MET    PLAN: D/C PT PHYSICAL THERAPY DISCHARGE SUMMARY  Visits from Start of Care: 11  Current functional level related to goals / functional outcomes: See above for most current status.  Pt has HEP in place and will continue to work on this for continued balance and strength gains    Remaining deficits: No functional deficits reported    Education / Equipment: HEP   Patient agrees to discharge. Patient goals were partially met. Patient is being discharged due to being pleased with the current functional level.   Lorrene Reid, PT 09/07/22 2:40 PM  '

## 2022-09-26 NOTE — Progress Notes (Unsigned)
PATIENT: Terry Shannon DOB: 1956-04-09  REASON FOR VISIT: follow up HISTORY FROM: patient PRIMARY NEUROLOGIST: Dr. Lucia Gaskins   Chief Complaint  Patient presents with   RM 19    Patient is here alone for cpap & migraine f/u; patient states she has only had 5 migraines since her last Vyepti infusion. She feels it is helping but would like to increase to 300 mg from 100 mg. She does not feel its going well with her CPAP. She has sores in her nose and is using the nasal interface. She states she hasn't used it in the last 5 days because she is trying to get the sores healed up. This has been an ongoing problem for 4-5 months and has gotten worse in last 4 weeks.      HISTORY OF PRESENT ILLNESS: Today 09/27/22:  Terry Shannon is a 67 y.o. female with a history of OSA on CPAP. Returns today for follow-up. She reports that she has struggled with the CPAP.  The mask is causing sores inside her nose.She currently has the nasal pillows.  Does not want a fullface mask.  Her download is below  In regards to her migraines she feels that Vyepti has been helpful.  Has had 5 migraines since her last infusion.  She would like to go ahead and bump to the 300 mg in the hopes that it gives her further benefit and she can wean off some of the oral medication.  She reports that she is no longer needing to take the immediate release propranolol before exercise as the Vyepti has been helpful      REVIEW OF SYSTEMS: Out of a complete 14 system review of symptoms, the patient complains only of the following symptoms, and all other reviewed systems are negative.   ALLERGIES: Allergies  Allergen Reactions   Codeine Nausea And Vomiting   Hydrocodone Nausea And Vomiting   Ibuprofen Other (See Comments)    MIGRAINES   Iodinated Contrast Media Hives and Itching   Methotrexate Derivatives Other (See Comments)    GI UPSET   Oxycodone Nausea And Vomiting   Tylenol [Acetaminophen] Other (See Comments)     MIGRAINES   Aleve [Naproxen Sodium]    Sulfa Antibiotics    Neosporin [Neomycin-Bacitracin Zn-Polymyx] Swelling    AT SITE    HOME MEDICATIONS: Outpatient Medications Prior to Visit  Medication Sig Dispense Refill   ALPRAZolam (XANAX) 0.5 MG tablet Take only before procedures (injections into the neck and low back) up to 3x a day 30 tablet 0   Ascorbic Acid (VITAMIN C PO) Take 6,000 mg by mouth daily.     aspirin EC 325 MG tablet Take 325 mg by mouth 2 (two) times daily as needed for mild pain.      B Complex-C (SUPER B COMPLEX PO) Take 1 tablet by mouth daily.     Cholecalciferol (VITAMIN D3 PO) Take 1 tablet by mouth daily.      cyclobenzaprine (FLEXERIL) 10 MG tablet Take 10 mg by mouth 3 (three) times daily as needed for muscle spasms.     desvenlafaxine (PRISTIQ) 100 MG 24 hr tablet Take 100 mg by mouth daily.      Flax OIL Take by mouth daily.     gabapentin (NEURONTIN) 300 MG capsule Take 300 mg by mouth 4 (four) times daily.      golimumab (SIMPONI ARIA) 50 MG/4ML SOLN injection See admin instructions.     ipratropium (ATROVENT) 0.02 % nebulizer solution Take  2.5 mLs (0.5 mg total) by nebulization 4 (four) times daily. 75 mL 12   ipratropium (ATROVENT) 0.03 % nasal spray Place 2 sprays into both nostrils every 12 (twelve) hours. 30 mL 12   levothyroxine (SYNTHROID) 50 MCG tablet Take 50 mcg by mouth daily before breakfast.     mesalamine (LIALDA) 1.2 G EC tablet Take 2.4 g by mouth 2 (two) times daily.      MIEBO 1.338 GM/ML SOLN      naphazoline-pheniramine (NAPHCON-A) 0.025-0.3 % ophthalmic solution Place 1 drop into both eyes 4 (four) times daily as needed for eye irritation. 15 mL 0   oxybutynin (DITROPAN-XL) 10 MG 24 hr tablet Take 10 mg by mouth daily.     pantoprazole (PROTONIX) 40 MG tablet Take 40 mg by mouth daily.     Probiotic Product (PROBIOTIC DAILY PO) Take 1 tablet by mouth daily.      propranolol (INDERAL) 10 MG tablet TAKE 1 TAB 30-60 MINUTES PRIOR TO EXERCISE  FOR EXERCISE-INDUCED HEADACHES. MAX TWICE DAILY 90 tablet 1   propranolol ER (INDERAL LA) 80 MG 24 hr capsule Take 1 capsule (80 mg total) by mouth at bedtime. For migraine prevention and high blood pressure. 90 capsule 4   RESTASIS MULTIDOSE 0.05 % ophthalmic emulsion Place 1 drop into both eyes 2 (two) times daily.     Rosuvastatin Calcium (CRESTOR PO) Take 20 mg by mouth daily.      sucralfate (CARAFATE) 1 g tablet Take 1 g by mouth 4 (four) times daily.     SUMAtriptan (IMITREX) 100 MG tablet TAKE 1 TABLET BY MOUTH ONCE DAILY AS NEEDED. MAY REPEAT IN 2 HOURS IF HEADACHE PERSISTS OR RECURS. 10 tablet 11   thyroid (ARMOUR) 60 MG tablet Take 60 mg by mouth daily before breakfast.     zolpidem (AMBIEN CR) 12.5 MG CR tablet TAKE ONE TABLET BY MOUTH DAILY AT BEDTIME 30 tablet 0   zonisamide (ZONEGRAN) 100 MG capsule Take 1 capsule (100 mg total) by mouth daily. For migraine prevention. 90 capsule 3   No facility-administered medications prior to visit.    PAST MEDICAL HISTORY: Past Medical History:  Diagnosis Date   Allergy    year-round, pt. 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   right   Complication of anesthesia    slow to wake up after gallbladder surgery   Depression    Eczema    ARMS AND HANDS   GERD (gastroesophageal reflux disease)    History of thyroid cancer    Hypothyroidism    Migraines    Osteoarthritis    Overactive bladder    RA (rheumatoid arthritis) (HCC)    Sleep apnea    no CPAP use   Squamous cell carcinoma of skin 12/21/2017   in situ-mid chest (CX35FU)   Ulcerative colitis (HCC)     PAST SURGICAL HISTORY: Past Surgical History:  Procedure Laterality Date   ABDOMINAL HERNIA REPAIR  03/13/2008   periumbilical ventral hernia/incisional hernia   BUNIONECTOMY Right    x 2 more   BUNIONECTOMY Left    BUNIONECTOMY WITH CHILECTOMY Right 12/16/2005   CARPAL TUNNEL RELEASE Right 07/13/2001   CARPAL TUNNEL RELEASE Left  08/10/2001   DILATION AND CURETTAGE OF UTERUS     HAMMER TOE SURGERY Left may 2014   HYSTEROSCOPY WITH D & C  03/01/2004   with exc. of endometrial polyp   KNEE ARTHROSCOPY Right 2013   LAPAROSCOPIC CHOLECYSTECTOMY  11/03/2006   LUMBAR FUSION  03/06/2014   l4  l5    nerve ablation lumbar      THYROIDECTOMY, PARTIAL Right prior to 2002   THYROIDECTOMY, PARTIAL Left 11/29/2000   and isthmus   TONSILLECTOMY  1961   TOTAL ANKLE REPLACEMENT Left 11/2016   with tendon repair   TOTAL KNEE ARTHROPLASTY Right 04/01/2013   Procedure: RIGHT TOTAL KNEE ARTHROPLASTY;  Surgeon: Harvie Junior, MD;  Location: [facility] OR;  Service: Orthopedics;  Laterality: Right;    FAMILY HISTORY: Family History  Problem Relation Age of Onset   Stroke Mother    Asthma Mother    Cancer Father 47       lung   Cancer Maternal Grandmother 60       colon   Stroke Maternal Grandmother    Stroke Maternal Grandfather    Diabetes Paternal Grandmother    Diabetes Brother    Barrett's esophagus Brother    Migraines Neg Hx    Breast cancer Neg Hx     SOCIAL HISTORY: Social History   Socioeconomic History   Marital status: Significant Other    Spouse name: Not on file   Number of children: 0   Years of education: Not on file   Highest education level: Bachelor's degree (e.g., BA, AB, BS)  Occupational History   Not on file  Tobacco Use   Smoking status: Never   Smokeless tobacco: Never  Vaping Use   Vaping Use: Never used  Substance and Sexual Activity   Alcohol use: Not Currently    Comment: occ   Drug use: No   Sexual activity: Not Currently    Birth control/protection: None  Other Topics Concern   Not on file  Social History Narrative   Lives at home with her partner and two daughters   Right handed   Caffeine; 1 cup/daily.     Social Determinants of Health   Financial Resource Strain: Not on file  Food Insecurity: Not on file  Transportation Needs: Not on file  Physical Activity: Not on file   Stress: Not on file  Social Connections: Not on file  Intimate Partner Violence: Not on file      PHYSICAL EXAM  Vitals:   09/27/22 1343  BP: (!) 168/80  Pulse: (!) 52  Weight: 173 lb (78.5 kg)  Height: 5\' 6"  (1.676 m)   Body mass index is 27.92 kg/m.  Generalized: Well developed, in no acute distress  Chest: Lungs clear to auscultation bilaterally  Neurological examination  Mentation: Alert oriented to time, place, history taking. Follows all commands speech and language fluent Cranial nerve II-XII: Extraocular movements were full, visual field were full on confrontational test Head turning and shoulder shrug  were normal and symmetric. Motor: The motor testing reveals 5 over 5 strength of all 4 extremities. Good symmetric motor tone is noted throughout.  Sensory: Sensory testing is intact to soft touch on all 4 extremities. No evidence of extinction is noted.  Gait and station: Gait is normal.    DIAGNOSTIC DATA (LABS, IMAGING, TESTING) - I reviewed patient records, labs, notes, testing and imaging myself where available.  Lab Results  Component Value Date   WBC 7.7 01/27/2022   HGB 13.2 01/27/2022   HCT 40.2 01/27/2022   MCV 90.8 01/27/2022   PLT 246.0 01/27/2022      Component Value Date/Time   NA 142 07/01/2019 0926   K 4.4 07/01/2019 0926   CL 106 07/01/2019 0926  CO2 22 07/01/2019 0926   GLUCOSE 114 (H) 07/01/2019 0926   GLUCOSE 114 (H) 01/15/2018 0206   BUN 14 07/01/2019 0926   CREATININE 0.54 (L) 07/01/2019 0926   CALCIUM 9.5 07/01/2019 0926   PROT 7.1 02/28/2014 1418   ALBUMIN 3.7 02/28/2014 1418   AST 20 02/28/2014 1418   ALT 36 (H) 02/28/2014 1418   ALKPHOS 99 02/28/2014 1418   BILITOT <0.2 (L) 02/28/2014 1418   GFRNONAA 99 07/01/2019 0926   GFRAA 115 07/01/2019 0926       ASSESSMENT AND PLAN 67 y.o. year old female  has a past medical history of Allergy, Chronic lower back pain, CMC arthritis, thumb, degenerative (06/2013), Complication  of anesthesia, Depression, Eczema, GERD (gastroesophageal reflux disease), History of thyroid cancer, Hypothyroidism, Migraines, Osteoarthritis, Overactive bladder, RA (rheumatoid arthritis) (HCC), Sleep apnea, Squamous cell carcinoma of skin (12/21/2017), and Ulcerative colitis (HCC). here with:  OSA on CPAP  - CPAP compliance Suboptimal - Good treatment of AHI  - Encourage patient to use CPAP nightly and > 4 hours each night -Order sent for mask refitting try nasal mask or DreamWear mask  2.  Migraine headaches  -Increase Vyepti to 300 mg -Continue propranolol extended release 80 mg daily  - F/U in 6-7 months  or sooner if needed    Butch Penny, MSN, NP-C 09/27/2022, 1:32 PM [facility] [address] [place], [place] [address] [phone]

## 2022-09-27 ENCOUNTER — Ambulatory Visit (INDEPENDENT_AMBULATORY_CARE_PROVIDER_SITE_OTHER): Payer: Medicare Other | Admitting: Adult Health

## 2022-09-27 ENCOUNTER — Encounter: Payer: Self-pay | Admitting: Adult Health

## 2022-09-27 VITALS — BP 168/80 | HR 52 | Ht 66.0 in | Wt 173.0 lb

## 2022-09-27 DIAGNOSIS — G4733 Obstructive sleep apnea (adult) (pediatric): Secondary | ICD-10-CM | POA: Diagnosis not present

## 2022-09-27 DIAGNOSIS — G43711 Chronic migraine without aura, intractable, with status migrainosus: Secondary | ICD-10-CM

## 2022-09-28 ENCOUNTER — Telehealth: Payer: Self-pay | Admitting: *Deleted

## 2022-09-28 NOTE — Telephone Encounter (Signed)
Vyepti 300 mg IV every 3 months ordered  and 100 mg stopped. Order form signed by Aundra Millet NP. Order form given to [facility] team for processing.

## 2022-09-28 NOTE — Telephone Encounter (Signed)
-----   Message from Butch Penny, NP sent at 09/28/2022  8:47 AM EDT ----- Increase Vyepti to 300 mg- can you send in form?

## 2022-10-18 ENCOUNTER — Telehealth: Payer: Self-pay | Admitting: Adult Health

## 2022-10-18 DIAGNOSIS — M0579 Rheumatoid arthritis with rheumatoid factor of multiple sites without organ or systems involvement: Secondary | ICD-10-CM | POA: Diagnosis not present

## 2022-10-18 NOTE — Telephone Encounter (Signed)
New, Maryella Shivers, Otilio Jefferson, RN; [place], Elige Radon; [place], [place]; Felipa Furnace, Lenon Curt, [facility],  Per notes we received the order on 09-29-22 and our snap team verified they got in on 09-30-22 1032am.  Patient just received mask supplies on 09-11-22 so they may have held it till they are elig. for new mask. I will email them for a status updated.  Thank you,  Luellen Pucker

## 2022-10-18 NOTE — Telephone Encounter (Signed)
I sent a high-priority message over to the [employer] health team.  Will update patient as soon as we hear back.

## 2022-10-18 NOTE — Telephone Encounter (Signed)
Pt stated she needs to talk to nurse. Stated she never received a call for her new mask fitting.

## 2022-10-19 NOTE — Telephone Encounter (Signed)
I reached back out to [facility] w/ [employer] this AM to see if he has an update.

## 2022-10-20 NOTE — Telephone Encounter (Signed)
I called the pt & LVM (Ok per [facility]) advising her of the contents of the mychart message I sent her below. Advised her that I would let her know when I hear back from [employer]. I also gave her the local number to call and speak with customer service #[phone]. I also left our office number in the message and hours for her to call us back if needed.

## 2022-10-27 DIAGNOSIS — H524 Presbyopia: Secondary | ICD-10-CM | POA: Diagnosis not present

## 2022-10-27 DIAGNOSIS — H353131 Nonexudative age-related macular degeneration, bilateral, early dry stage: Secondary | ICD-10-CM | POA: Diagnosis not present

## 2022-10-27 DIAGNOSIS — H04123 Dry eye syndrome of bilateral lacrimal glands: Secondary | ICD-10-CM | POA: Diagnosis not present

## 2022-10-27 DIAGNOSIS — H16213 Exposure keratoconjunctivitis, bilateral: Secondary | ICD-10-CM | POA: Diagnosis not present

## 2022-10-27 DIAGNOSIS — H25813 Combined forms of age-related cataract, bilateral: Secondary | ICD-10-CM | POA: Diagnosis not present

## 2022-11-02 DIAGNOSIS — G43711 Chronic migraine without aura, intractable, with status migrainosus: Secondary | ICD-10-CM | POA: Diagnosis not present

## 2022-11-08 DIAGNOSIS — J3489 Other specified disorders of nose and nasal sinuses: Secondary | ICD-10-CM | POA: Diagnosis not present

## 2022-11-08 DIAGNOSIS — J310 Chronic rhinitis: Secondary | ICD-10-CM | POA: Diagnosis not present

## 2022-11-08 DIAGNOSIS — J343 Hypertrophy of nasal turbinates: Secondary | ICD-10-CM | POA: Diagnosis not present

## 2022-11-10 DIAGNOSIS — R413 Other amnesia: Secondary | ICD-10-CM | POA: Diagnosis not present

## 2022-11-10 DIAGNOSIS — R490 Dysphonia: Secondary | ICD-10-CM | POA: Diagnosis not present

## 2022-11-10 DIAGNOSIS — K219 Gastro-esophageal reflux disease without esophagitis: Secondary | ICD-10-CM | POA: Diagnosis not present

## 2022-11-10 DIAGNOSIS — I10 Essential (primary) hypertension: Secondary | ICD-10-CM | POA: Diagnosis not present

## 2022-11-10 DIAGNOSIS — G4730 Sleep apnea, unspecified: Secondary | ICD-10-CM | POA: Diagnosis not present

## 2022-11-18 ENCOUNTER — Other Ambulatory Visit: Payer: Self-pay | Admitting: Neurology

## 2022-11-30 ENCOUNTER — Other Ambulatory Visit: Payer: Self-pay | Admitting: Otolaryngology

## 2022-11-30 DIAGNOSIS — J324 Chronic pansinusitis: Secondary | ICD-10-CM | POA: Diagnosis not present

## 2022-11-30 DIAGNOSIS — J343 Hypertrophy of nasal turbinates: Secondary | ICD-10-CM | POA: Diagnosis not present

## 2022-11-30 DIAGNOSIS — J329 Chronic sinusitis, unspecified: Secondary | ICD-10-CM

## 2022-12-01 DIAGNOSIS — R1012 Left upper quadrant pain: Secondary | ICD-10-CM | POA: Diagnosis not present

## 2022-12-01 DIAGNOSIS — K5190 Ulcerative colitis, unspecified, without complications: Secondary | ICD-10-CM | POA: Diagnosis not present

## 2022-12-01 DIAGNOSIS — R1013 Epigastric pain: Secondary | ICD-10-CM | POA: Diagnosis not present

## 2022-12-01 DIAGNOSIS — K219 Gastro-esophageal reflux disease without esophagitis: Secondary | ICD-10-CM | POA: Diagnosis not present

## 2022-12-05 ENCOUNTER — Ambulatory Visit
Admission: RE | Admit: 2022-12-05 | Discharge: 2022-12-05 | Disposition: A | Discharge status: Home / Self Care | Payer: Medicare Other | Source: Ambulatory Visit | Attending: Otolaryngology | Admitting: Otolaryngology

## 2022-12-05 ENCOUNTER — Other Ambulatory Visit: Payer: Medicare Other

## 2022-12-05 DIAGNOSIS — R6884 Jaw pain: Secondary | ICD-10-CM | POA: Diagnosis not present

## 2022-12-05 DIAGNOSIS — J329 Chronic sinusitis, unspecified: Secondary | ICD-10-CM

## 2022-12-09 DIAGNOSIS — H16213 Exposure keratoconjunctivitis, bilateral: Secondary | ICD-10-CM | POA: Diagnosis not present

## 2022-12-09 DIAGNOSIS — H40043 Steroid responder, bilateral: Secondary | ICD-10-CM | POA: Diagnosis not present

## 2022-12-09 DIAGNOSIS — H5713 Ocular pain, bilateral: Secondary | ICD-10-CM | POA: Diagnosis not present

## 2022-12-09 DIAGNOSIS — H04123 Dry eye syndrome of bilateral lacrimal glands: Secondary | ICD-10-CM | POA: Diagnosis not present

## 2022-12-13 DIAGNOSIS — M0579 Rheumatoid arthritis with rheumatoid factor of multiple sites without organ or systems involvement: Secondary | ICD-10-CM | POA: Diagnosis not present

## 2022-12-20 DIAGNOSIS — J343 Hypertrophy of nasal turbinates: Secondary | ICD-10-CM | POA: Diagnosis not present

## 2022-12-20 DIAGNOSIS — J310 Chronic rhinitis: Secondary | ICD-10-CM | POA: Diagnosis not present

## 2022-12-20 DIAGNOSIS — J342 Deviated nasal septum: Secondary | ICD-10-CM | POA: Diagnosis not present

## 2022-12-21 ENCOUNTER — Other Ambulatory Visit: Payer: Medicare Other

## 2022-12-25 ENCOUNTER — Other Ambulatory Visit: Payer: Self-pay | Admitting: Neurology

## 2022-12-25 DIAGNOSIS — G43711 Chronic migraine without aura, intractable, with status migrainosus: Secondary | ICD-10-CM

## 2022-12-28 ENCOUNTER — Telehealth: Payer: Self-pay | Admitting: Adult Health

## 2022-12-28 NOTE — Telephone Encounter (Signed)
Pt states [facility] is telling her that the Rx for the zonisamide (ZONEGRAN) 100 MG capsule was never called in on 8-4.  Pt is asking if the zonisamide (ZONEGRAN) 100 MG capsule can be called into [id number]  again.

## 2022-12-28 NOTE — Telephone Encounter (Signed)
Ok will send

## 2023-01-04 ENCOUNTER — Other Ambulatory Visit: Payer: Self-pay | Admitting: Neurology

## 2023-01-09 DIAGNOSIS — H40043 Steroid responder, bilateral: Secondary | ICD-10-CM | POA: Diagnosis not present

## 2023-01-09 DIAGNOSIS — H16213 Exposure keratoconjunctivitis, bilateral: Secondary | ICD-10-CM | POA: Diagnosis not present

## 2023-01-09 DIAGNOSIS — H04123 Dry eye syndrome of bilateral lacrimal glands: Secondary | ICD-10-CM | POA: Diagnosis not present

## 2023-01-09 DIAGNOSIS — H5713 Ocular pain, bilateral: Secondary | ICD-10-CM | POA: Diagnosis not present

## 2023-01-13 DIAGNOSIS — M792 Neuralgia and neuritis, unspecified: Secondary | ICD-10-CM | POA: Diagnosis not present

## 2023-01-13 DIAGNOSIS — D2372 Other benign neoplasm of skin of left lower limb, including hip: Secondary | ICD-10-CM | POA: Diagnosis not present

## 2023-01-13 DIAGNOSIS — Q828 Other specified congenital malformations of skin: Secondary | ICD-10-CM | POA: Diagnosis not present

## 2023-01-13 DIAGNOSIS — D492 Neoplasm of unspecified behavior of bone, soft tissue, and skin: Secondary | ICD-10-CM | POA: Diagnosis not present

## 2023-01-14 ENCOUNTER — Other Ambulatory Visit: Payer: Self-pay | Admitting: Neurology

## 2023-01-19 DIAGNOSIS — Z01411 Encounter for gynecological examination (general) (routine) with abnormal findings: Secondary | ICD-10-CM | POA: Diagnosis not present

## 2023-01-19 DIAGNOSIS — Z124 Encounter for screening for malignant neoplasm of cervix: Secondary | ICD-10-CM | POA: Diagnosis not present

## 2023-01-19 DIAGNOSIS — Z779 Other contact with and (suspected) exposures hazardous to health: Secondary | ICD-10-CM | POA: Diagnosis not present

## 2023-01-19 DIAGNOSIS — N898 Other specified noninflammatory disorders of vagina: Secondary | ICD-10-CM | POA: Diagnosis not present

## 2023-01-19 DIAGNOSIS — Z01419 Encounter for gynecological examination (general) (routine) without abnormal findings: Secondary | ICD-10-CM | POA: Diagnosis not present

## 2023-01-19 DIAGNOSIS — Z6828 Body mass index (BMI) 28.0-28.9, adult: Secondary | ICD-10-CM | POA: Diagnosis not present

## 2023-01-19 DIAGNOSIS — R350 Frequency of micturition: Secondary | ICD-10-CM | POA: Diagnosis not present

## 2023-01-19 DIAGNOSIS — N952 Postmenopausal atrophic vaginitis: Secondary | ICD-10-CM | POA: Diagnosis not present

## 2023-01-31 DIAGNOSIS — R5383 Other fatigue: Secondary | ICD-10-CM | POA: Diagnosis not present

## 2023-01-31 DIAGNOSIS — E890 Postprocedural hypothyroidism: Secondary | ICD-10-CM | POA: Diagnosis not present

## 2023-01-31 DIAGNOSIS — M069 Rheumatoid arthritis, unspecified: Secondary | ICD-10-CM | POA: Diagnosis not present

## 2023-01-31 DIAGNOSIS — E782 Mixed hyperlipidemia: Secondary | ICD-10-CM | POA: Diagnosis not present

## 2023-01-31 DIAGNOSIS — R490 Dysphonia: Secondary | ICD-10-CM | POA: Diagnosis not present

## 2023-01-31 DIAGNOSIS — R7309 Other abnormal glucose: Secondary | ICD-10-CM | POA: Diagnosis not present

## 2023-01-31 DIAGNOSIS — K5190 Ulcerative colitis, unspecified, without complications: Secondary | ICD-10-CM | POA: Diagnosis not present

## 2023-01-31 DIAGNOSIS — E559 Vitamin D deficiency, unspecified: Secondary | ICD-10-CM | POA: Diagnosis not present

## 2023-02-02 DIAGNOSIS — G43711 Chronic migraine without aura, intractable, with status migrainosus: Secondary | ICD-10-CM | POA: Diagnosis not present

## 2023-02-06 ENCOUNTER — Ambulatory Visit
Admission: RE | Admit: 2023-02-06 | Discharge: 2023-02-06 | Disposition: A | Discharge status: Home / Self Care | Payer: Medicare Other | Source: Ambulatory Visit | Attending: Internal Medicine | Admitting: Internal Medicine

## 2023-02-06 ENCOUNTER — Other Ambulatory Visit: Payer: Medicare Other

## 2023-02-06 DIAGNOSIS — R069 Unspecified abnormalities of breathing: Secondary | ICD-10-CM | POA: Diagnosis not present

## 2023-02-06 DIAGNOSIS — E2839 Other primary ovarian failure: Secondary | ICD-10-CM

## 2023-02-06 DIAGNOSIS — N958 Other specified menopausal and perimenopausal disorders: Secondary | ICD-10-CM | POA: Diagnosis not present

## 2023-02-06 DIAGNOSIS — E349 Endocrine disorder, unspecified: Secondary | ICD-10-CM | POA: Diagnosis not present

## 2023-02-06 DIAGNOSIS — M8588 Other specified disorders of bone density and structure, other site: Secondary | ICD-10-CM | POA: Diagnosis not present

## 2023-02-07 DIAGNOSIS — Z79899 Other long term (current) drug therapy: Secondary | ICD-10-CM | POA: Diagnosis not present

## 2023-02-07 DIAGNOSIS — R5383 Other fatigue: Secondary | ICD-10-CM | POA: Diagnosis not present

## 2023-02-07 DIAGNOSIS — M0579 Rheumatoid arthritis with rheumatoid factor of multiple sites without organ or systems involvement: Secondary | ICD-10-CM | POA: Diagnosis not present

## 2023-02-08 ENCOUNTER — Other Ambulatory Visit: Payer: Self-pay | Admitting: Internal Medicine

## 2023-02-08 DIAGNOSIS — K219 Gastro-esophageal reflux disease without esophagitis: Secondary | ICD-10-CM | POA: Diagnosis not present

## 2023-02-08 DIAGNOSIS — R490 Dysphonia: Secondary | ICD-10-CM | POA: Diagnosis not present

## 2023-02-08 DIAGNOSIS — J310 Chronic rhinitis: Secondary | ICD-10-CM | POA: Diagnosis not present

## 2023-02-08 DIAGNOSIS — H6991 Unspecified Eustachian tube disorder, right ear: Secondary | ICD-10-CM | POA: Diagnosis not present

## 2023-02-08 DIAGNOSIS — E890 Postprocedural hypothyroidism: Secondary | ICD-10-CM

## 2023-02-13 ENCOUNTER — Ambulatory Visit
Admission: RE | Admit: 2023-02-13 | Discharge: 2023-02-13 | Disposition: A | Discharge status: Home / Self Care | Payer: Medicare Other | Source: Ambulatory Visit | Attending: Internal Medicine | Admitting: Internal Medicine

## 2023-02-13 DIAGNOSIS — E890 Postprocedural hypothyroidism: Secondary | ICD-10-CM | POA: Diagnosis not present

## 2023-02-13 DIAGNOSIS — K219 Gastro-esophageal reflux disease without esophagitis: Secondary | ICD-10-CM | POA: Diagnosis not present

## 2023-02-15 DIAGNOSIS — H903 Sensorineural hearing loss, bilateral: Secondary | ICD-10-CM | POA: Diagnosis not present

## 2023-02-16 DIAGNOSIS — M0579 Rheumatoid arthritis with rheumatoid factor of multiple sites without organ or systems involvement: Secondary | ICD-10-CM | POA: Diagnosis not present

## 2023-02-16 DIAGNOSIS — Z6827 Body mass index (BMI) 27.0-27.9, adult: Secondary | ICD-10-CM | POA: Diagnosis not present

## 2023-02-16 DIAGNOSIS — R5382 Chronic fatigue, unspecified: Secondary | ICD-10-CM | POA: Diagnosis not present

## 2023-02-16 DIAGNOSIS — M1991 Primary osteoarthritis, unspecified site: Secondary | ICD-10-CM | POA: Diagnosis not present

## 2023-02-16 DIAGNOSIS — E663 Overweight: Secondary | ICD-10-CM | POA: Diagnosis not present

## 2023-02-16 DIAGNOSIS — M3501 Sicca syndrome with keratoconjunctivitis: Secondary | ICD-10-CM | POA: Diagnosis not present

## 2023-04-03 DIAGNOSIS — K219 Gastro-esophageal reflux disease without esophagitis: Secondary | ICD-10-CM | POA: Diagnosis not present

## 2023-04-03 DIAGNOSIS — R490 Dysphonia: Secondary | ICD-10-CM | POA: Diagnosis not present

## 2023-04-04 DIAGNOSIS — M0579 Rheumatoid arthritis with rheumatoid factor of multiple sites without organ or systems involvement: Secondary | ICD-10-CM | POA: Diagnosis not present

## 2023-04-14 DIAGNOSIS — D2372 Other benign neoplasm of skin of left lower limb, including hip: Secondary | ICD-10-CM | POA: Diagnosis not present

## 2023-04-14 DIAGNOSIS — Q828 Other specified congenital malformations of skin: Secondary | ICD-10-CM | POA: Diagnosis not present

## 2023-04-14 DIAGNOSIS — M792 Neuralgia and neuritis, unspecified: Secondary | ICD-10-CM | POA: Diagnosis not present

## 2023-04-26 DIAGNOSIS — H04123 Dry eye syndrome of bilateral lacrimal glands: Secondary | ICD-10-CM | POA: Diagnosis not present

## 2023-04-27 DIAGNOSIS — H6521 Chronic serous otitis media, right ear: Secondary | ICD-10-CM | POA: Diagnosis not present

## 2023-04-27 DIAGNOSIS — H90A31 Mixed conductive and sensorineural hearing loss, unilateral, right ear with restricted hearing on the contralateral side: Secondary | ICD-10-CM | POA: Diagnosis not present

## 2023-04-27 DIAGNOSIS — G43711 Chronic migraine without aura, intractable, with status migrainosus: Secondary | ICD-10-CM | POA: Diagnosis not present

## 2023-04-27 DIAGNOSIS — H903 Sensorineural hearing loss, bilateral: Secondary | ICD-10-CM | POA: Diagnosis not present

## 2023-05-01 ENCOUNTER — Ambulatory Visit: Payer: Medicare Other | Admitting: Adult Health

## 2023-05-01 ENCOUNTER — Telehealth: Payer: Self-pay | Admitting: Adult Health

## 2023-05-01 DIAGNOSIS — H04123 Dry eye syndrome of bilateral lacrimal glands: Secondary | ICD-10-CM | POA: Diagnosis not present

## 2023-05-01 NOTE — Telephone Encounter (Signed)
thanks

## 2023-05-01 NOTE — Telephone Encounter (Signed)
Pt called to r/s appt due to having pink eye.

## 2023-05-04 DIAGNOSIS — R7309 Other abnormal glucose: Secondary | ICD-10-CM | POA: Diagnosis not present

## 2023-05-04 DIAGNOSIS — M069 Rheumatoid arthritis, unspecified: Secondary | ICD-10-CM | POA: Diagnosis not present

## 2023-05-04 DIAGNOSIS — E782 Mixed hyperlipidemia: Secondary | ICD-10-CM | POA: Diagnosis not present

## 2023-05-04 DIAGNOSIS — E890 Postprocedural hypothyroidism: Secondary | ICD-10-CM | POA: Diagnosis not present

## 2023-05-04 DIAGNOSIS — E559 Vitamin D deficiency, unspecified: Secondary | ICD-10-CM | POA: Diagnosis not present

## 2023-05-06 DIAGNOSIS — R0789 Other chest pain: Secondary | ICD-10-CM | POA: Diagnosis not present

## 2023-05-11 ENCOUNTER — Encounter: Payer: Self-pay | Admitting: Anesthesiology

## 2023-05-11 DIAGNOSIS — G43909 Migraine, unspecified, not intractable, without status migrainosus: Secondary | ICD-10-CM | POA: Diagnosis not present

## 2023-05-11 DIAGNOSIS — M069 Rheumatoid arthritis, unspecified: Secondary | ICD-10-CM | POA: Diagnosis not present

## 2023-05-11 DIAGNOSIS — K219 Gastro-esophageal reflux disease without esophagitis: Secondary | ICD-10-CM | POA: Diagnosis not present

## 2023-05-11 DIAGNOSIS — E890 Postprocedural hypothyroidism: Secondary | ICD-10-CM | POA: Diagnosis not present

## 2023-05-11 DIAGNOSIS — Z0001 Encounter for general adult medical examination with abnormal findings: Secondary | ICD-10-CM | POA: Diagnosis not present

## 2023-05-11 DIAGNOSIS — Z6826 Body mass index (BMI) 26.0-26.9, adult: Secondary | ICD-10-CM | POA: Diagnosis not present

## 2023-05-11 DIAGNOSIS — E782 Mixed hyperlipidemia: Secondary | ICD-10-CM | POA: Diagnosis not present

## 2023-05-11 DIAGNOSIS — K5190 Ulcerative colitis, unspecified, without complications: Secondary | ICD-10-CM | POA: Diagnosis not present

## 2023-05-16 ENCOUNTER — Encounter: Payer: Self-pay | Admitting: Adult Health

## 2023-05-16 ENCOUNTER — Ambulatory Visit: Payer: Medicare Other | Admitting: Adult Health

## 2023-05-16 VITALS — BP 114/72 | HR 70 | Ht 65.0 in | Wt 167.0 lb

## 2023-05-16 DIAGNOSIS — G43711 Chronic migraine without aura, intractable, with status migrainosus: Secondary | ICD-10-CM | POA: Diagnosis not present

## 2023-05-16 DIAGNOSIS — G4733 Obstructive sleep apnea (adult) (pediatric): Secondary | ICD-10-CM

## 2023-05-16 NOTE — Patient Instructions (Addendum)
Your Plan:  Continue using CPAP  Continue Vyepti, zonegran for migraine prevention  If your symptoms worsen or you develop new symptoms please let us know.   Thank you for coming to see [place] at [facility]. I hope we have been able to provide you high quality care today.  You may receive a patient satisfaction survey over the next few weeks. We would appreciate your feedback and comments so that we may continue to improve ourselves and the health of our patients.

## 2023-05-16 NOTE — Progress Notes (Signed)
PATIENT: Terry Shannon DOB: August 09, 1955  REASON FOR VISIT: follow up HISTORY FROM: patient PRIMARY NEUROLOGIST: Dr. Lucia Gaskins   Chief Complaint  Patient presents with   Follow-up    Pt in 19, here alone Pt is here for follow up on OSA with CPAP. Pt states after switching her mask she feels is suffocating her. Pt states the nose piece is blowing air towards her eyes. ESS 4     HISTORY OF PRESENT ILLNESS: Today 05/16/23:  Terry Shannon is a 67 y.o. female with a history of OSA on CPAP. Returns today for follow-up. Reports that she often goes to sleep without putting the CPAP on but then will put it on when she wakes up to go to the bathroom.   In regards to her migraines she states that she has not been having many migraines.  She states that she only gets migraines when she is active.  Reports that she has been feeling well so she has not been doing much.  She remains on Vyepti.  She also takes Zonegran daily for prevention.  She will use sumatriptan for abortive therapy.  She has propranolol 10 mg to take on an as-needed basis for when she exercises however she has not had to use this recently as she has not been very active.     09/27/22: Terry Shannon is a 67 y.o. female with a history of OSA on CPAP. Returns today for follow-up. She reports that she has struggled with the CPAP.  The mask is causing sores inside her nose.She currently has the nasal pillows.  Does not want a fullface mask.  Her download is below  In regards to her migraines she feels that Vyepti has been helpful.  Has had 5 migraines since her last infusion.  She would like to go ahead and bump to the 300 mg in the hopes that it gives her further benefit and she can wean off some of the oral medication.  She reports that she is no longer needing to take the immediate release propranolol before exercise as the Vyepti has been helpful      REVIEW OF SYSTEMS: Out of a complete 14 system review of symptoms, the patient  complains only of the following symptoms, and all other reviewed systems are negative.   ALLERGIES: Allergies  Allergen Reactions   Codeine Nausea And Vomiting   Hydrocodone Nausea And Vomiting   Ibuprofen Other (See Comments)    MIGRAINES   Iodinated Contrast Media Hives and Itching   Methotrexate Derivatives Other (See Comments)    GI UPSET   Oxycodone Nausea And Vomiting   Tylenol [Acetaminophen] Other (See Comments)    MIGRAINES   Aleve [Naproxen Sodium]    Sulfa Antibiotics    Neosporin [Neomycin-Bacitracin Zn-Polymyx] Swelling    AT SITE    HOME MEDICATIONS: Outpatient Medications Prior to Visit  Medication Sig Dispense Refill   Ascorbic Acid (VITAMIN C PO) Take 6,000 mg by mouth daily.     aspirin EC 325 MG tablet Take 325 mg by mouth 2 (two) times daily as needed for mild pain.      B Complex-C (SUPER B COMPLEX PO) Take 1 tablet by mouth daily.     Cholecalciferol (VITAMIN D3 PO) Take 1 tablet by mouth daily.      cyclobenzaprine (FLEXERIL) 10 MG tablet Take 10 mg by mouth 3 (three) times daily as needed for muscle spasms.     desvenlafaxine (PRISTIQ) 100 MG 24 hr tablet Take  100 mg by mouth daily.      gabapentin (NEURONTIN) 300 MG capsule Take 300 mg by mouth 4 (four) times daily.      golimumab (SIMPONI ARIA) 50 MG/4ML SOLN injection See admin instructions.     ipratropium (ATROVENT) 0.03 % nasal spray Place 2 sprays into both nostrils every 12 (twelve) hours. 30 mL 12   levothyroxine (SYNTHROID) 50 MCG tablet Take 50 mcg by mouth daily before breakfast.     mesalamine (LIALDA) 1.2 G EC tablet Take 2.4 g by mouth 2 (two) times daily.      MIEBO 1.338 GM/ML SOLN      naphazoline-pheniramine (NAPHCON-A) 0.025-0.3 % ophthalmic solution Place 1 drop into both eyes 4 (four) times daily as needed for eye irritation. 15 mL 0   oxybutynin (DITROPAN-XL) 10 MG 24 hr tablet Take 10 mg by mouth daily.     Probiotic Product (PROBIOTIC DAILY PO) Take 1 tablet by mouth daily.       propranolol (INDERAL) 10 MG tablet TAKE 1 TAB 30-60 MINUTES PRIOR TO EXERCISE FOR EXERCISE-INDUCED HEADACHES. MAX TWICE DAILY 90 tablet 0   propranolol (INDERAL) 20 MG tablet Take 20 mg by mouth 2 (two) times daily.     RESTASIS MULTIDOSE 0.05 % ophthalmic emulsion Place 1 drop into both eyes 2 (two) times daily.     Rosuvastatin Calcium (CRESTOR PO) Take 20 mg by mouth daily.      SUMAtriptan (IMITREX) 100 MG tablet TAKE 1 TABLET BY MOUTH ONCE DAILY AS NEEDED. MAY REPEAT IN 2 HOURS IF HEADACHE PERSISTS OR RECURS. 10 tablet 4   thyroid (ARMOUR) 60 MG tablet Take 60 mg by mouth daily before breakfast.     Vonoprazan Fumarate (VOQUEZNA) 20 MG TABS Take by mouth.     zolpidem (AMBIEN CR) 12.5 MG CR tablet TAKE ONE TABLET BY MOUTH DAILY AT BEDTIME 30 tablet 0   zonisamide (ZONEGRAN) 100 MG capsule TAKE 1 CAPSULE (100 MG TOTAL) BY MOUTH DAILY. FOR MIGRAINE PREVENTION. 90 capsule 3   ALPRAZolam (XANAX) 0.5 MG tablet Take only before procedures (injections into the neck and low back) up to 3x a day (Patient not taking: Reported on 05/16/2023) 30 tablet 0   Flax OIL Take by mouth daily. (Patient not taking: Reported on 05/16/2023)     ipratropium (ATROVENT) 0.02 % nebulizer solution Take 2.5 mLs (0.5 mg total) by nebulization 4 (four) times daily. (Patient not taking: Reported on 05/16/2023) 75 mL 12   sucralfate (CARAFATE) 1 g tablet Take 1 g by mouth 4 (four) times daily. (Patient not taking: Reported on 05/16/2023)     pantoprazole (PROTONIX) 40 MG tablet Take 40 mg by mouth daily.     propranolol ER (INDERAL LA) 80 MG 24 hr capsule TAKE 1 CAPSULE (80 MG TOTAL) BY MOUTH AT BEDTIME. FOR MIGRAINE PREVENTION AND HIGH BLOOD PRESSURE. 90 capsule 4   No facility-administered medications prior to visit.    PAST MEDICAL HISTORY: Past Medical History:  Diagnosis Date   Allergy    year-round, pt. 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   right   Complication of  anesthesia    slow to wake up after gallbladder surgery   Depression    Eczema    ARMS AND HANDS   GERD (gastroesophageal reflux disease)    History of thyroid cancer    Hypothyroidism    Migraines    Osteoarthritis    Overactive bladder  RA (rheumatoid arthritis) (HCC)    Sleep apnea    no CPAP use   Squamous cell carcinoma of skin 12/21/2017   in situ-mid chest (CX35FU)   Ulcerative colitis (HCC)     PAST SURGICAL HISTORY: Past Surgical History:  Procedure Laterality Date   ABDOMINAL HERNIA REPAIR  03/13/2008   periumbilical ventral hernia/incisional hernia   BUNIONECTOMY Right    x 2 more   BUNIONECTOMY Left    BUNIONECTOMY WITH CHILECTOMY Right 12/16/2005   CARPAL TUNNEL RELEASE Right 07/13/2001   CARPAL TUNNEL RELEASE Left 08/10/2001   DILATION AND CURETTAGE OF UTERUS     HAMMER TOE SURGERY Left may 2014   HYSTEROSCOPY WITH D & C  03/01/2004   with exc. of endometrial polyp   KNEE ARTHROSCOPY Right 2013   LAPAROSCOPIC CHOLECYSTECTOMY  11/03/2006   LUMBAR FUSION  03/06/2014   l4  l5    nerve ablation lumbar      THYROIDECTOMY, PARTIAL Right prior to 2002   THYROIDECTOMY, PARTIAL Left 11/29/2000   and isthmus   TONSILLECTOMY  1961   TOTAL ANKLE REPLACEMENT Left 11/2016   with tendon repair   TOTAL KNEE ARTHROPLASTY Right 04/01/2013   Procedure: RIGHT TOTAL KNEE ARTHROPLASTY;  Surgeon: Harvie Junior, MD;  Location: [facility] OR;  Service: Orthopedics;  Laterality: Right;    FAMILY HISTORY: Family History  Problem Relation Age of Onset   Stroke Mother    Asthma Mother    Cancer Father 74       lung   Diabetes Brother    Barrett's esophagus Brother    Heart Problems Brother        pacemaker placement   Cancer Maternal Grandmother 33       colon   Stroke Maternal Grandmother    Stroke Maternal Grandfather    Diabetes Paternal Grandmother    Migraines Neg Hx    Breast cancer Neg Hx     SOCIAL HISTORY: Social History   Socioeconomic History   Marital  status: Significant Other    Spouse name: Not on file   Number of children: 0   Years of education: Not on file   Highest education level: Bachelor's degree (e.g., BA, AB, BS)  Occupational History   Not on file  Tobacco Use   Smoking status: Never   Smokeless tobacco: Never  Vaping Use   Vaping status: Never Used  Substance and Sexual Activity   Alcohol use: Yes    Comment: occ   Drug use: No   Sexual activity: Not Currently    Birth control/protection: None  Other Topics Concern   Not on file  Social History Narrative   Lives at home with her partner and two daughters   Right handed   Caffeine; none   Social Drivers of Corporate investment banker Strain: Not on file  Food Insecurity: Low Risk  (02/08/2023)   Received from [facility]   Hunger Vital Sign    Worried About Running Out of Food in the Last Year: Never true    Ran Out of Food in the Last Year: Never true  Transportation Needs: No Transportation Needs (02/08/2023)   Received from [facility]    In the past 12 months, has lack of reliable transportation kept you from medical appointments, meetings, work or from getting things needed for daily living? : No  Physical Activity: Not on file  Stress: Not on file  Social Connections: Not on file  Intimate Partner Violence: Not on file      PHYSICAL EXAM  Vitals:   05/16/23 0814  BP: 114/72  Pulse: 70  Weight: 167 lb (75.8 kg)  Height: 5\' 5"  (1.651 m)   Body mass index is 27.79 kg/m.  Generalized: Well developed, in no acute distress  Chest: Lungs clear to auscultation bilaterally  Neurological examination  Mentation: Alert oriented to time, place, history taking. Follows all commands speech and language fluent Cranial nerve II-XII: Extraocular movements were full, visual field were full on confrontational test Head turning and shoulder shrug  were normal and symmetric. Motor: The motor testing reveals 5 over 5 strength of all 4  extremities. Good symmetric motor tone is noted throughout.  Sensory: Sensory testing is intact to soft touch on all 4 extremities. No evidence of extinction is noted.  Gait and station: Gait is normal.    DIAGNOSTIC DATA (LABS, IMAGING, TESTING) - I reviewed patient records, labs, notes, testing and imaging myself where available.  Lab Results  Component Value Date   WBC 7.7 01/27/2022   HGB 13.2 01/27/2022   HCT 40.2 01/27/2022   MCV 90.8 01/27/2022   PLT 246.0 01/27/2022      Component Value Date/Time   NA 142 07/01/2019 0926   K 4.4 07/01/2019 0926   CL 106 07/01/2019 0926   CO2 22 07/01/2019 0926   GLUCOSE 114 (H) 07/01/2019 0926   GLUCOSE 114 (H) 01/15/2018 0206   BUN 14 07/01/2019 0926   CREATININE 0.54 (L) 07/01/2019 0926   CALCIUM 9.5 07/01/2019 0926   PROT 7.1 02/28/2014 1418   ALBUMIN 3.7 02/28/2014 1418   AST 20 02/28/2014 1418   ALT 36 (H) 02/28/2014 1418   ALKPHOS 99 02/28/2014 1418   BILITOT <0.2 (L) 02/28/2014 1418   GFRNONAA 99 07/01/2019 0926   GFRAA 115 07/01/2019 0926       ASSESSMENT AND PLAN 67 y.o. year old female  has a past medical history of Allergy, Chronic lower back pain, CMC arthritis, thumb, degenerative (06/2013), Complication of anesthesia, Depression, Eczema, GERD (gastroesophageal reflux disease), History of thyroid cancer, Hypothyroidism, Migraines, Osteoarthritis, Overactive bladder, RA (rheumatoid arthritis) (HCC), Sleep apnea, Squamous cell carcinoma of skin (12/21/2017), and Ulcerative colitis (HCC). here with:  OSA on CPAP  - CPAP compliance Suboptimal - Good treatment of AHI  - Encourage patient to use CPAP nightly and > 4 hours each night -Encouraged her to put her CPAP on as soon as she goes to bed.  2.  Migraine headaches  -Continue Vyepti to 300 mg -Continue sumatriptan for abortive therapy -Continue Zonegran 100 mg at bedtime for prevention  - F/U in 7-8 months  or sooner if needed    Butch Penny, MSN, NP-C  05/16/2023, 8:33 AM [facility] [address] [place], [place] [address] [phone]

## 2023-05-22 ENCOUNTER — Encounter (HOSPITAL_BASED_OUTPATIENT_CLINIC_OR_DEPARTMENT_OTHER): Payer: Self-pay

## 2023-05-22 ENCOUNTER — Emergency Department (HOSPITAL_BASED_OUTPATIENT_CLINIC_OR_DEPARTMENT_OTHER): Payer: Medicare Other | Admitting: Radiology

## 2023-05-22 ENCOUNTER — Other Ambulatory Visit: Payer: Self-pay

## 2023-05-22 ENCOUNTER — Emergency Department (HOSPITAL_BASED_OUTPATIENT_CLINIC_OR_DEPARTMENT_OTHER)
Admission: EM | Admit: 2023-05-22 | Discharge: 2023-05-22 | Disposition: A | Discharge status: Home / Self Care | Payer: Medicare Other | Attending: Emergency Medicine | Admitting: Emergency Medicine

## 2023-05-22 DIAGNOSIS — R079 Chest pain, unspecified: Secondary | ICD-10-CM | POA: Diagnosis not present

## 2023-05-22 DIAGNOSIS — Z7982 Long term (current) use of aspirin: Secondary | ICD-10-CM | POA: Diagnosis not present

## 2023-05-22 DIAGNOSIS — R072 Precordial pain: Secondary | ICD-10-CM | POA: Insufficient documentation

## 2023-05-22 DIAGNOSIS — E039 Hypothyroidism, unspecified: Secondary | ICD-10-CM | POA: Diagnosis not present

## 2023-05-22 LAB — BASIC METABOLIC PANEL
Anion gap: 8 (ref 5–15)
BUN: 18 mg/dL (ref 8–23)
CO2: 23 mmol/L (ref 22–32)
Calcium: 9.1 mg/dL (ref 8.9–10.3)
Chloride: 109 mmol/L (ref 98–111)
Creatinine, Ser: 0.45 mg/dL (ref 0.44–1.00)
GFR, Estimated: >60 mL/min (ref >60)
Glucose, Bld: 109 mg/dL — ABNORMAL HIGH (ref 70–99)
Potassium: 4.1 mmol/L (ref 3.5–5.1)
Sodium: 140 mmol/L (ref 135–145)

## 2023-05-22 LAB — CBC
HCT: 40.4 % (ref 36.0–46.0)
Hemoglobin: 13.5 g/dL (ref 12.0–15.0)
MCH: 30 pg (ref 26.0–34.0)
MCHC: 33.4 g/dL (ref 30.0–36.0)
MCV: 89.8 fL (ref 80.0–100.0)
Platelets: 268 10*3/uL (ref 150–400)
RBC: 4.5 MIL/uL (ref 3.87–5.11)
RDW: 13.5 % (ref 11.5–15.5)
WBC: 7.2 10*3/uL (ref 4.0–10.5)
nRBC: 0 % (ref 0.0–0.2)

## 2023-05-22 LAB — D-DIMER, QUANTITATIVE: D-Dimer, Quant: 0.28 ug{FEU}/mL (ref 0.00–0.50)

## 2023-05-22 LAB — TROPONIN I (HIGH SENSITIVITY): Troponin I (High Sensitivity): 3 ng/L (ref <18)

## 2023-05-22 MED ORDER — ALUM & MAG HYDROXIDE-SIMETH 200-200-20 MG/5ML PO SUSP
15.0000 mL | Freq: Once | ORAL | Status: AC
  Administered 2023-05-22: 15 mL via ORAL
  Filled 2023-05-22: qty 30

## 2023-05-22 MED ORDER — FAMOTIDINE 20 MG PO TABS
20.0000 mg | ORAL_TABLET | Freq: Once | ORAL | Status: AC
  Administered 2023-05-22: 20 mg via ORAL
  Filled 2023-05-22: qty 1

## 2023-05-22 NOTE — ED Triage Notes (Signed)
Pt c/o CP onset 2wks ago, "some" associated SHOB, denies NV. Seen at [facility] "& they said my EKG was a little abnormal but didn't recommend ED eval." Advises she saw PCP & had med adjustment (propanolol), "but I'm still having CP."

## 2023-05-22 NOTE — Discharge Instructions (Addendum)
As discussed, workup today overall reassuring.  Your heart enzyme appeared normal without concerning changes on your EKG for heart attack.  Your D-dimer or enzyme for blood clot rule out was negative so there is a very low chance of having a blood clot in the lung.  Chest x-ray without obvious pneumonia, collapsed lung or other abnormality.  Suspect your symptoms could be secondary to GERD.  Recommend follow-up with PCP/GI in the outpatient setting for reevaluation.  Please do not hesitate to return if the worrisome signs and symptoms we discussed become apparent.

## 2023-05-30 DIAGNOSIS — Z79899 Other long term (current) drug therapy: Secondary | ICD-10-CM | POA: Diagnosis not present

## 2023-05-30 DIAGNOSIS — Z111 Encounter for screening for respiratory tuberculosis: Secondary | ICD-10-CM | POA: Diagnosis not present

## 2023-05-30 DIAGNOSIS — M0579 Rheumatoid arthritis with rheumatoid factor of multiple sites without organ or systems involvement: Secondary | ICD-10-CM | POA: Diagnosis not present

## 2023-06-20 DIAGNOSIS — K219 Gastro-esophageal reflux disease without esophagitis: Secondary | ICD-10-CM | POA: Diagnosis not present

## 2023-06-20 DIAGNOSIS — K5190 Ulcerative colitis, unspecified, without complications: Secondary | ICD-10-CM | POA: Diagnosis not present

## 2023-06-20 DIAGNOSIS — R079 Chest pain, unspecified: Secondary | ICD-10-CM | POA: Diagnosis not present

## 2023-06-27 ENCOUNTER — Telehealth: Payer: Self-pay | Admitting: *Deleted

## 2023-06-27 NOTE — Telephone Encounter (Signed)
   Pre-operative Risk Assessment    Patient Name: Terry Shannon  DOB: 1955/06/02 MRN: [id number]   Date of last office visit: 03/03/2022 Date of next office visit: None   Request for Surgical Clearance    Procedure:   EGD  Date of Surgery:  Clearance 07/06/21                                 Surgeon:  Dr. Belvie Just Surgeon's Group or Practice Name:  [facility] Phone number:  [phone] Fax number:  [phone]   Type of Clearance Requested:   - Medical  - Pharmacy:  Hold Aspirin  Not Indicated.   Type of Anesthesia:   Propofol    Additional requests/questions:    Signed, Edsel Grayce Sanders   06/27/2023, 3:53 PM

## 2023-06-27 NOTE — Telephone Encounter (Signed)
Left message to call back to schedule in office appt.

## 2023-06-27 NOTE — Telephone Encounter (Signed)
    Primary Cardiologist:Branch, Ronal BRAVO, MD  Chart reviewed as part of pre-operative protocol coverage. Because of Terry Shannon's past medical history and time since last visit, he/she will require a follow-up visit in order to better assess preoperative cardiovascular risk.  Pre-op covering staff: - Please schedule in person office appointment and call patient to inform them. - Please contact requesting surgeon's office via preferred method (i.e, phone, fax) to inform them of need for appointment prior to surgery.  If applicable, this message will also be routed to pharmacy pool and/or primary cardiologist for input on holding anticoagulant/antiplatelet agent as requested below so that this information is available at time of patient's appointment.   Terry CHRISTELLA Beauvais, NP  06/27/2023, 4:08 PM

## 2023-06-28 NOTE — Telephone Encounter (Signed)
2nd attempt to reach pt to schedule in office appt for pre op clearance.

## 2023-06-29 NOTE — Telephone Encounter (Signed)
 Left another msg for pt to return call. Will route to requesting surgeons office to make them aware and remove from preop pool.

## 2023-07-03 ENCOUNTER — Ambulatory Visit (INDEPENDENT_AMBULATORY_CARE_PROVIDER_SITE_OTHER): Payer: Medicare Other | Admitting: Family

## 2023-07-03 ENCOUNTER — Encounter (HOSPITAL_BASED_OUTPATIENT_CLINIC_OR_DEPARTMENT_OTHER): Payer: Self-pay | Admitting: Family

## 2023-07-03 VITALS — BP 114/60 | HR 50 | Ht 65.5 in | Wt 166.1 lb

## 2023-07-03 DIAGNOSIS — Z01810 Encounter for preprocedural cardiovascular examination: Secondary | ICD-10-CM

## 2023-07-03 DIAGNOSIS — R0789 Other chest pain: Secondary | ICD-10-CM | POA: Diagnosis not present

## 2023-07-03 NOTE — Progress Notes (Signed)
 Cardiology Office Note:  .   Date:  07/03/2023  ID:  Payson Madar, DOB 1956-04-10, MRN [id number] PCP: Yolanda Hence, MD  [facility] Providers Cardiologist:  Bridgette Campus, MD    History of Present Illness: Melida Toller is a 68 y.o. female GERD, depression, thyroid  disease, eczema, ulcerative colitis, sleep apnea  Established Dr. Alois Arnt 02/2022 for exertional dyspnea. Echo 03/2022 LVEF 65-70%, mild LVH, gr1dd, RV normal, trivial MR. Myoview  02/2022 low risk study with no ischemia nor infarction.   ED visit 05/22/23 with atypical chest pain relieved with GI cocktail.   Presents today for follow up independently. Reports episodes of left sided chest pain at rest described as "sharp" not associated with dyspnea. Episodes self resolve. Reviewed prior normal cardiac workup. She does note over the last year some shortness of breath with exertion. She does note she has not been able to exercise in the last year or so due to GI issues. Interestingly, more dyspnea at home rather than when she is out and about. Reviewed Right Start exercise program and she will consider. No palpitations, edema, orthopnea, PND.    ROS: Please see the history of present illness.    All other systems reviewed and are negative.   Studies Reviewed: .        Cardiac Studies & Procedures     STRESS TESTS  MYOCARDIAL PERFUSION IMAGING 03/22/2022  Narrative   Nonspecific ST abnormality at baseline   During infusion no ST deviation from baseline EKG was noted. ECG was uninterpretable due to baseline repolarization abnormalities. The ECG was not diagnostic due to pharmacologic protocol.   LV perfusion is normal. There is no evidence of ischemia. There is no evidence of infarction.   Left ventricular function is normal. Nuclear stress EF: 65 %. The left ventricular ejection fraction is normal (55-65%). No evidence of transient ischemic dilation (TID) noted.   Prior study not available for  comparison.   The study is normal. The study is low risk.  ECHOCARDIOGRAM  ECHOCARDIOGRAM COMPLETE 03/22/2022  Narrative ECHOCARDIOGRAM REPORT    Patient Name:   MARAH WIGET Date of Exam: 03/22/2022 Medical Rec #:  [id number]     Height:       66.0 in Accession #:    [id number]    Weight:       180.0 lb Date of Birth:  1956-05-04     BSA:          1.912 m Patient Age:    66 years      BP:           128/74 mmHg Patient Gender: F             HR:           51 bpm. Exam Location:  [address]  Procedure: 2D Echo, Cardiac Doppler and Color Doppler  Indications:    R06.00 SOB  History:        Patient has prior history of Echocardiogram examinations, most recent 01/31/2008. Arrythmias:PVC; Signs/Symptoms:Shortness of Breath.  Sonographer:    Lula Sale RDCS Referring Phys: [phone] MARY E BRANCH  IMPRESSIONS   1. Left ventricular ejection fraction, by estimation, is 65 to 70%. The left ventricle has normal function. The left ventricle has no regional wall motion abnormalities. There is mild left ventricular hypertrophy. Left ventricular diastolic parameters are consistent with Grade I diastolic dysfunction (impaired relaxation). 2. Right ventricular systolic function is normal. The right ventricular size is normal.  There is normal pulmonary artery systolic pressure. The estimated right ventricular systolic pressure is 32.0 mmHg. 3. The mitral valve is grossly normal. Trivial mitral valve regurgitation. 4. The aortic valve is tricuspid. Aortic valve regurgitation is not visualized. 5. The inferior vena cava is normal in size with <50% respiratory variability, suggesting right atrial pressure of 8 mmHg.  FINDINGS Left Ventricle: Left ventricular ejection fraction, by estimation, is 65 to 70%. The left ventricle has normal function. The left ventricle has no regional wall motion abnormalities. The left ventricular internal cavity size was normal in size. There is mild left  ventricular hypertrophy. Left ventricular diastolic parameters are consistent with Grade I diastolic dysfunction (impaired relaxation). Indeterminate filling pressures.  Right Ventricle: The right ventricular size is normal. No increase in right ventricular wall thickness. Right ventricular systolic function is normal. There is normal pulmonary artery systolic pressure. The tricuspid regurgitant velocity is 2.45 m/s, and with an assumed right atrial pressure of 8 mmHg, the estimated right ventricular systolic pressure is 32.0 mmHg.  Left Atrium: Left atrial size was normal in size.  Right At[facility]: Right atrial size was normal in size.  Pericardium: There is no evidence of pericardial effusion.  Mitral Valve: The mitral valve is grossly normal. Trivial mitral valve regurgitation.  Tricuspid Valve: The tricuspid valve is grossly normal. Tricuspid valve regurgitation is trivial.  Aortic Valve: The aortic valve is tricuspid. Aortic valve regurgitation is not visualized.  Pulmonic Valve: The pulmonic valve was normal in structure. Pulmonic valve regurgitation is not visualized.  Aorta: The aortic root and ascending aorta are structurally normal, with no evidence of dilitation.  Venous: The inferior vena cava is normal in size with less than 50% respiratory variability, suggesting right atrial pressure of 8 mmHg.  IAS/Shunts: No atrial level shunt detected by color flow Doppler.   LEFT VENTRICLE PLAX 2D LVIDd:         4.30 cm   Diastology LVIDs:         2.70 cm   LV e' medial:    7.51 cm/s LV PW:         1.30 cm   LV E/e' medial:  14.4 LV IVS:        1.30 cm   LV e' lateral:   7.94 cm/s LVOT diam:     1.90 cm   LV E/e' lateral: 13.6 LV SV:         78 LV SV Index:   41 LVOT Area:     2.84 cm   RIGHT VENTRICLE             IVC RV S prime:     13.40 cm/s  IVC diam: 1.50 cm TAPSE (M-mode): 2.3 cm RVSP:           27.0 mmHg  LEFT ATRIUM             Index        RIGHT ATRIUM            Index LA diam:        4.10 cm 2.14 cm/m   RA Pressure: 3.00 mmHg LA Vol (A2C):   64.9 ml 33.94 ml/m  RA Area:     10.60 cm LA Vol (A4C):   49.3 ml 25.78 ml/m  RA Volume:   24.10 ml  12.60 ml/m LA Biplane Vol: 53.3 ml 27.87 ml/m AORTIC VALVE LVOT Vmax:   115.00 cm/s LVOT Vmean:  73.400 cm/s LVOT VTI:    0.275 m  AORTA Ao Root diam: 3.00 cm Ao Asc diam:  3.70 cm  MITRAL VALVE                TRICUSPID VALVE MV Area (PHT): 2.92 cm     TR Peak grad:   24.0 mmHg MV Decel Time: 260 msec     TR Vmax:        245.00 cm/s MV E velocity: 108.00 cm/s  Estimated RAP:  3.00 mmHg MV A velocity: 123.00 cm/s  RVSP:           27.0 mmHg MV E/A ratio:  0.88 SHUNTS Systemic VTI:  0.28 m Systemic Diam: 1.90 cm  Dinah Franco MD Electronically signed by Dinah Franco MD Signature Date/Time: 03/22/2022/12:38:01 PM    Final             Risk Assessment/Calculations:             Physical Exam:   VS:  BP 114/60 (Cuff Size: Normal)   Pulse (!) 50   Ht 5' 5.5" (1.664 m)   Wt 166 lb 1.6 oz (75.3 kg)   SpO2 95%   BMI 27.22 kg/m    Wt Readings from Last 3 Encounters:  07/03/23 166 lb 1.6 oz (75.3 kg)  05/16/23 167 lb (75.8 kg)  09/27/22 173 lb (78.5 kg)    GEN: Well nourished, well developed in no acute distress NECK: No JVD; No carotid bruits CARDIAC: RRR, no murmurs, rubs, gallops RESPIRATORY:  Clear to auscultation without rales, wheezing or rhonchi  ABDOMEN: Soft, non-tender, non-distended EXTREMITIES:  No edema; No deformity   ASSESSMENT AND PLAN: .    Preop - Pending EGD. According to the Revised Cardiac Risk Index (RCRI), her Perioperative Risk of Major Cardiac Event is (%): 0.4. Her Functional Capacity in METs is: 4.64 according to the Duke Activity Status Index (DASI). Per AHA/ACC guidelines, she is deemed acceptable risk for the planned procedure without additional cardiovascular testing. Will route to surgical team so they are aware.    Atypical chest pain - Reports chest  pain at rest that is sharp and left sided and self resolves. Similar symptoms prior to normal myoview  2023. EKG today SB with asymptomatic PAC and no acute St/T wave changes. Prior ischemic workup 02/2022 unremarkable for similar symptoms. Anticipate etiology GI. Exercise tolerance >4 METS. No indication for ischemic eval.  DOE - Prior cardiac workup unremarkable and symptoms overall unchanged. Has EGD upcoming for further evaluation of GERD. Anticipate deconditioning also contributory. Provided information on Right Start exercise program.       Dispo: follow up as needed with cardiology  Signed, Clearnce Curia, NP

## 2023-07-03 NOTE — Patient Instructions (Addendum)
 Medication Instructions:  Your physician recommends that you continue on your current medications as directed. Please refer to the Current Medication list given to you today.  Procedures: Your EKG today showed sinus bradycardia which is a normal heart rhythm but slightly slow. This is not of concern and likely your heart rate is a bit lower due to your Propranolol .  You had an occasional early beat called a PAC (premature atrial contraction) which is a very common finding and not of concern. If you ever feel your heart jump, skip, or flutter this is likely the cause. They are most often caused by caffeine, dehydration, or stress.   Follow-Up: At [facility], you and your health needs are our priority.  As part of our continuing mission to provide you with exceptional heart care, we have created designated Provider Care Teams.  These Care Teams include your primary Cardiologist (physician) and Advanced Practice Providers (APPs -  Physician Assistants and Nurse Practitioners) who all work together to provide you with the care you need, when you need it.  We recommend signing up for the patient portal called "MyChart".  Sign up information is provided on this After Visit Summary.  MyChart is used to connect with patients for Virtual Visits (Telemedicine).  Patients are able to view lab/test results, encounter notes, upcoming appointments, etc.  Non-urgent messages can be sent to your provider as well.   To learn more about what you can do with MyChart, go to [email or web address].    Your next appointment:   Call us  if you need us !   Other Instructions

## 2023-07-07 DIAGNOSIS — K2960 Other gastritis without bleeding: Secondary | ICD-10-CM | POA: Diagnosis not present

## 2023-07-07 DIAGNOSIS — R490 Dysphonia: Secondary | ICD-10-CM | POA: Diagnosis not present

## 2023-07-07 DIAGNOSIS — K3189 Other diseases of stomach and duodenum: Secondary | ICD-10-CM | POA: Diagnosis not present

## 2023-07-07 DIAGNOSIS — K319 Disease of stomach and duodenum, unspecified: Secondary | ICD-10-CM | POA: Diagnosis not present

## 2023-07-07 DIAGNOSIS — K219 Gastro-esophageal reflux disease without esophagitis: Secondary | ICD-10-CM | POA: Diagnosis not present

## 2023-07-07 DIAGNOSIS — R1013 Epigastric pain: Secondary | ICD-10-CM | POA: Diagnosis not present

## 2023-07-19 DIAGNOSIS — D2372 Other benign neoplasm of skin of left lower limb, including hip: Secondary | ICD-10-CM | POA: Diagnosis not present

## 2023-07-19 DIAGNOSIS — Q828 Other specified congenital malformations of skin: Secondary | ICD-10-CM | POA: Diagnosis not present

## 2023-07-19 DIAGNOSIS — M792 Neuralgia and neuritis, unspecified: Secondary | ICD-10-CM | POA: Diagnosis not present

## 2023-07-25 DIAGNOSIS — M67911 Unspecified disorder of synovium and tendon, right shoulder: Secondary | ICD-10-CM | POA: Diagnosis not present

## 2023-07-25 DIAGNOSIS — M0579 Rheumatoid arthritis with rheumatoid factor of multiple sites without organ or systems involvement: Secondary | ICD-10-CM | POA: Diagnosis not present

## 2023-07-25 DIAGNOSIS — M069 Rheumatoid arthritis, unspecified: Secondary | ICD-10-CM | POA: Diagnosis not present

## 2023-07-26 ENCOUNTER — Ambulatory Visit (HOSPITAL_COMMUNITY): Admission: RE | Admit: 2023-07-26 | Payer: Medicare Other | Source: Home / Self Care | Admitting: Gastroenterology

## 2023-07-26 ENCOUNTER — Encounter (HOSPITAL_COMMUNITY): Admission: RE | Payer: Self-pay | Source: Home / Self Care

## 2023-07-26 SURGERY — MANOMETRY, ESOPHAGUS

## 2023-07-26 NOTE — Progress Notes (Signed)
 Note that patient called unit and said the manometry and ph test should have been cancelled. She will call office to reschedule. Made ashely aware in dr. Elnoria Howard office

## 2023-07-31 DIAGNOSIS — G43711 Chronic migraine without aura, intractable, with status migrainosus: Secondary | ICD-10-CM | POA: Diagnosis not present

## 2023-08-16 DIAGNOSIS — Z6826 Body mass index (BMI) 26.0-26.9, adult: Secondary | ICD-10-CM | POA: Diagnosis not present

## 2023-08-16 DIAGNOSIS — M1991 Primary osteoarthritis, unspecified site: Secondary | ICD-10-CM | POA: Diagnosis not present

## 2023-08-16 DIAGNOSIS — E663 Overweight: Secondary | ICD-10-CM | POA: Diagnosis not present

## 2023-08-16 DIAGNOSIS — M3501 Sicca syndrome with keratoconjunctivitis: Secondary | ICD-10-CM | POA: Diagnosis not present

## 2023-08-16 DIAGNOSIS — R5382 Chronic fatigue, unspecified: Secondary | ICD-10-CM | POA: Diagnosis not present

## 2023-08-16 DIAGNOSIS — M0579 Rheumatoid arthritis with rheumatoid factor of multiple sites without organ or systems involvement: Secondary | ICD-10-CM | POA: Diagnosis not present

## 2023-08-31 ENCOUNTER — Telehealth: Payer: Self-pay | Admitting: Adult Health

## 2023-08-31 NOTE — Telephone Encounter (Signed)
 LVM and sent mychart msg informing pt of need to reschedule 01/17/24 appt - NP schedule change

## 2023-09-12 DIAGNOSIS — R040 Epistaxis: Secondary | ICD-10-CM | POA: Diagnosis not present

## 2023-09-12 DIAGNOSIS — J310 Chronic rhinitis: Secondary | ICD-10-CM | POA: Diagnosis not present

## 2023-09-12 DIAGNOSIS — K219 Gastro-esophageal reflux disease without esophagitis: Secondary | ICD-10-CM | POA: Diagnosis not present

## 2023-09-19 DIAGNOSIS — M0579 Rheumatoid arthritis with rheumatoid factor of multiple sites without organ or systems involvement: Secondary | ICD-10-CM | POA: Diagnosis not present

## 2023-10-18 DIAGNOSIS — M792 Neuralgia and neuritis, unspecified: Secondary | ICD-10-CM | POA: Diagnosis not present

## 2023-10-18 DIAGNOSIS — D2372 Other benign neoplasm of skin of left lower limb, including hip: Secondary | ICD-10-CM | POA: Diagnosis not present

## 2023-10-18 DIAGNOSIS — Q828 Other specified congenital malformations of skin: Secondary | ICD-10-CM | POA: Diagnosis not present

## 2023-10-18 DIAGNOSIS — D2371 Other benign neoplasm of skin of right lower limb, including hip: Secondary | ICD-10-CM | POA: Diagnosis not present

## 2023-10-26 DIAGNOSIS — H04123 Dry eye syndrome of bilateral lacrimal glands: Secondary | ICD-10-CM | POA: Diagnosis not present

## 2023-10-26 DIAGNOSIS — H25813 Combined forms of age-related cataract, bilateral: Secondary | ICD-10-CM | POA: Diagnosis not present

## 2023-10-26 DIAGNOSIS — H1045 Other chronic allergic conjunctivitis: Secondary | ICD-10-CM | POA: Diagnosis not present

## 2023-10-26 DIAGNOSIS — H353131 Nonexudative age-related macular degeneration, bilateral, early dry stage: Secondary | ICD-10-CM | POA: Diagnosis not present

## 2023-10-30 ENCOUNTER — Telehealth: Payer: Self-pay | Admitting: *Deleted

## 2023-10-30 NOTE — Telephone Encounter (Signed)
 Per Buddie Carina w/ [facility], need updated order for Vyepti  300 mg IV every 3 months. Order signed by Jacqlyn Matas NP and returned to [place].

## 2023-11-01 DIAGNOSIS — E890 Postprocedural hypothyroidism: Secondary | ICD-10-CM | POA: Diagnosis not present

## 2023-11-01 DIAGNOSIS — M069 Rheumatoid arthritis, unspecified: Secondary | ICD-10-CM | POA: Diagnosis not present

## 2023-11-01 DIAGNOSIS — G4733 Obstructive sleep apnea (adult) (pediatric): Secondary | ICD-10-CM | POA: Diagnosis not present

## 2023-11-01 DIAGNOSIS — R5383 Other fatigue: Secondary | ICD-10-CM | POA: Diagnosis not present

## 2023-11-02 DIAGNOSIS — G43711 Chronic migraine without aura, intractable, with status migrainosus: Secondary | ICD-10-CM | POA: Diagnosis not present

## 2023-11-07 DIAGNOSIS — K219 Gastro-esophageal reflux disease without esophagitis: Secondary | ICD-10-CM | POA: Diagnosis not present

## 2023-11-14 DIAGNOSIS — M0579 Rheumatoid arthritis with rheumatoid factor of multiple sites without organ or systems involvement: Secondary | ICD-10-CM | POA: Diagnosis not present

## 2023-11-19 ENCOUNTER — Other Ambulatory Visit: Payer: Self-pay | Admitting: Neurology

## 2023-12-14 ENCOUNTER — Other Ambulatory Visit: Payer: Self-pay | Admitting: Adult Health

## 2023-12-14 DIAGNOSIS — G43711 Chronic migraine without aura, intractable, with status migrainosus: Secondary | ICD-10-CM

## 2023-12-19 DIAGNOSIS — R490 Dysphonia: Secondary | ICD-10-CM | POA: Diagnosis not present

## 2023-12-19 DIAGNOSIS — K51018 Ulcerative (chronic) pancolitis with other complication: Secondary | ICD-10-CM | POA: Diagnosis not present

## 2023-12-19 DIAGNOSIS — R1310 Dysphagia, unspecified: Secondary | ICD-10-CM | POA: Diagnosis not present

## 2023-12-19 DIAGNOSIS — K219 Gastro-esophageal reflux disease without esophagitis: Secondary | ICD-10-CM | POA: Diagnosis not present

## 2024-01-09 DIAGNOSIS — Z79899 Other long term (current) drug therapy: Secondary | ICD-10-CM | POA: Diagnosis not present

## 2024-01-09 DIAGNOSIS — R682 Dry mouth, unspecified: Secondary | ICD-10-CM | POA: Diagnosis not present

## 2024-01-09 DIAGNOSIS — R490 Dysphonia: Secondary | ICD-10-CM | POA: Diagnosis not present

## 2024-01-09 DIAGNOSIS — K5190 Ulcerative colitis, unspecified, without complications: Secondary | ICD-10-CM | POA: Diagnosis not present

## 2024-01-09 DIAGNOSIS — M0579 Rheumatoid arthritis with rheumatoid factor of multiple sites without organ or systems involvement: Secondary | ICD-10-CM | POA: Diagnosis not present

## 2024-01-09 DIAGNOSIS — R5383 Other fatigue: Secondary | ICD-10-CM | POA: Diagnosis not present

## 2024-01-09 DIAGNOSIS — R1310 Dysphagia, unspecified: Secondary | ICD-10-CM | POA: Diagnosis not present

## 2024-01-09 DIAGNOSIS — K219 Gastro-esophageal reflux disease without esophagitis: Secondary | ICD-10-CM | POA: Diagnosis not present

## 2024-01-10 ENCOUNTER — Other Ambulatory Visit (HOSPITAL_COMMUNITY): Payer: Self-pay | Admitting: Gastroenterology

## 2024-01-10 DIAGNOSIS — R059 Cough, unspecified: Secondary | ICD-10-CM

## 2024-01-10 DIAGNOSIS — R1310 Dysphagia, unspecified: Secondary | ICD-10-CM

## 2024-01-11 ENCOUNTER — Ambulatory Visit (HOSPITAL_COMMUNITY)
Admission: RE | Admit: 2024-01-11 | Discharge: 2024-01-11 | Disposition: A | Discharge status: Home / Self Care | Source: Ambulatory Visit | Attending: Internal Medicine | Admitting: Internal Medicine

## 2024-01-11 DIAGNOSIS — R1310 Dysphagia, unspecified: Secondary | ICD-10-CM | POA: Insufficient documentation

## 2024-01-11 DIAGNOSIS — R059 Cough, unspecified: Secondary | ICD-10-CM | POA: Diagnosis not present

## 2024-01-11 DIAGNOSIS — R638 Other symptoms and signs concerning food and fluid intake: Secondary | ICD-10-CM | POA: Diagnosis not present

## 2024-01-11 NOTE — Progress Notes (Signed)
 Modified Barium Swallow Study  Patient Details  Name: Terry Shannon MRN: [id number] Date of Birth: 02/06/1956  Today's Date: 01/11/2024  HPI/PMH: HPI: 68 yo female referred by Dr Kristie for OP MBS. 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   Pt reports issues with chronic throat clearing and coughing - admits coughing with worse after meals. Has been on two PPIs and an H2blocker and does not consume caffeine or ETOH. She reports she was exposed to 2nd hand smoke as a child.  Reports she is s/p endoscopy - SLP could not locate the report but per notes - erythema was noted in vocal cords, post nasal drainage.  Pt - . She has seen Dr Jesus, ENT conducted indirect laryngoscopy that revealed normal cord mobility without mucosal lesions.  There was some hyperemia of both cords posteriorally and some mucus a the esophageal introitus- otherwise normal.   Pt denies having significant issues with swallowing including not requiring heimlich manuever nor aspirating. She does endorse sensing retention in pharynx *pointing near vallecular space - midline- and having food or liquid come back up at times - most notably with a cough.  In addition, admits to occasional pain on the right side of her throat.   Clinical Impression: Clinical Impression: Patient presents with functional oropharyneal swallow ability. Her swallow is characterized by decreased timing of laryngeal closure/epiglottic closure/deflection resulting in laryngeal penetration of thin liquids.    No aspiration noted despite sequential swallows.  Minimal oral retention with liquids spilled into pharynx and were also tracely penetrated.  Pharyngeal swallow is strong without significant retention.  Barium tablet taken with thin easily transited  through oropharynx.  Of note, following exam, pt coughed and reported backflow of cracker and liquids into mouth that she subsequently re-swallowed.  No evidence of oropharynegal dysfunction to account for her symptoms. She was noted to clear her throat prior to study and cough and clear her throat throughout and after testing.  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  Thanks for this consult of this most pleasant pt.  Pt's symptoms were not replicated during today's test.  Factors that may increase risk of adverse event in presence of aspiration Noe & Lianne 2021): No data recorded  Recommendations/Plan: Swallowing Evaluation Recommendations Swallowing Evaluation Recommendations Recommendations: PO diet PO Diet Recommendation: Regular; Thin liquids (Level 0) Liquid Administration via: Cup; Straw Medication Administration: Whole meds with liquid Supervision: Patient able to self-feed Swallowing strategies  : Slow rate; Small bites/sips (start intake with liquids) Postural changes: Position pt fully upright for meals; Stay upright 30-60 min after meals Oral care recommendations: Oral care BID (2x/day)    Treatment Plan No data recorded   Recommendations Recommendations for follow up therapy are one component of a multi-disciplinary discharge planning process, led by the attending physician.  Recommendations may be updated based on patient status, additional functional criteria and insurance authorization.  Assessment: Orofacial Exam: Orofacial Exam Oral Cavity: Oral Hygiene: WFL Oral Cavity - Dentition: Adequate natural dentition Orofacial Anatomy: WFL Oral Motor/Sensory Function: WFL    Anatomy:  Anatomy: Prominent cricopharyngeus   Boluses Administered: Boluses Administered Boluses Administered: Thin liquids (Level 0); Mildly thick liquids (Level 2, nectar thick);  Moderately thick liquids (Level 3, honey thick); Puree;  Solid     Oral Impairment Domain: Oral Impairment Domain Lip Closure: No labial escape Tongue control during bolus hold: Cohesive bolus between tongue to palatal seal Bolus preparation/mastication: Timely and efficient chewing and mashing Bolus transport/lingual motion: Brisk tongue motion Oral residue: Trace residue lining oral structures Location of oral residue : Tongue Initiation of pharyngeal swallow : Valleculae; Pyriform sinuses     Pharyngeal Impairment Domain: Pharyngeal Impairment Domain Soft palate elevation: No bolus between soft palate (SP)/pharyngeal wall (PW) Laryngeal elevation: Complete superior movement of thyroid  cartilage with complete approximation of arytenoids to epiglottic petiole Anterior hyoid excursion: Complete anterior movement Epiglottic movement: Complete inversion (late closing of epiglottis) Laryngeal vestibule closure: Incomplete, narrow column air/contrast in laryngeal vestibule Pharyngeal stripping wave : Absent Pharyngeal contraction (A/P view only): N/A Pharyngoesophageal segment opening: Complete distension and complete duration, no obstruction of flow Tongue base retraction: No contrast between tongue base and posterior pharyngeal wall (PPW) Pharyngeal residue: Trace residue within or on pharyngeal structures Location of pharyngeal residue: Valleculae; Tongue base; Aryepiglottic folds; Pyriform sinuses (v)     Esophageal Impairment Domain: Esophageal Impairment Domain Esophageal clearance upright position: Esophageal retention    Pill: Pill Consistency administered: Thin liquids (Level 0) Thin liquids (Level 0): [facility]    Penetration/Aspiration Scale Score: Penetration/Aspiration Scale Score 1.  Material does not enter airway: Moderately thick liquids (Level 3, honey thick); Puree; Solid; Pill 2.  Material enters airway, remains ABOVE vocal cords then ejected out: Mildly thick  liquids (Level 2, nectar thick) 3.  Material enters airway, remains ABOVE vocal cords and not ejected out: Thin liquids (Level 0)    Compensatory Strategies: Compensatory Strategies Compensatory strategies: No       General Information: Caregiver present: No   Diet Prior to this Study: Regular; Thin liquids (Level 0)    Temperature : Normal    Respiratory Status: WFL    Supplemental O2: None (Room air)    History of Recent Intubation: No   Behavior/Cognition: Alert; Cooperative; Pleasant mood  Self-Feeding Abilities: Able to self-feed  Baseline vocal quality/speech: Dysphonic  Volitional Cough: Able to elicit  Volitional Swallow: Able to elicit  Exam Limitations: No limitations   Goal Planning: No data recorded No data recorded No data recorded No data recorded No data recorded  Pain: Pain Assessment Pain Assessment: No/denies pain    End of Session: Start Time:SLP Start Time (ACUTE ONLY): 1320  Stop Time: SLP Stop Time (ACUTE ONLY): 1420  Time Calculation:SLP Time Calculation (min) (ACUTE ONLY): 60 min  Charges: SLP Evaluations $ SLP Speech Visit: 1 Visit  SLP Evaluations $Outpatient MBS Swallow: 1 Procedure   SLP visit diagnosis: SLP Visit Diagnosis: Dysphagia, unspecified (R13.10)    Past Medical History:  Past Medical History:  Diagnosis Date   Allergy     year-round, pt. 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   right   Complication of anesthesia    slow to wake up after gallbladder surgery   Depression    Eczema    ARMS AND HANDS   GERD (gastroesophageal reflux disease)    History of thyroid  cancer    Hypothyroidism    Migraines    Osteoarthritis    Overactive bladder    RA (rheumatoid arthritis) (HCC)    Sleep apnea    no CPAP use   Squamous cell carcinoma of skin 12/21/2017   in situ-mid chest (CX35FU)   Ulcerative colitis (HCC)    Past Surgical History:  Past Surgical History:   Procedure Laterality Date   ABDOMINAL HERNIA REPAIR  03/13/2008   periumbilical ventral hernia/incisional hernia   BUNIONECTOMY Right    x 2 more   BUNIONECTOMY Left    BUNIONECTOMY WITH CHILECTOMY Right 12/16/2005   CARPAL TUNNEL RELEASE Right 07/13/2001   CARPAL TUNNEL RELEASE Left 08/10/2001   DILATION AND CURETTAGE OF UTERUS     HAMMER TOE SURGERY Left may 2014   HYSTEROSCOPY WITH D & C  03/01/2004   with exc. of endometrial polyp   KNEE ARTHROSCOPY Right 2013   LAPAROSCOPIC CHOLECYSTECTOMY  11/03/2006   LUMBAR FUSION  03/06/2014   l4  l5    nerve ablation lumbar      THYROIDECTOMY, PARTIAL Right prior to 2002   THYROIDECTOMY, PARTIAL Left 11/29/2000   and isthmus   TONSILLECTOMY  1961   TOTAL ANKLE REPLACEMENT Left 11/2016   with tendon repair   TOTAL KNEE ARTHROPLASTY Right 04/01/2013   Procedure: RIGHT TOTAL KNEE ARTHROPLASTY;  Surgeon: [name], MD;  Location: [facility] OR;  Service: Orthopedics;  Laterality: Right;   Madelin POUR, MS [facility] SLP Acute Rehab Services Office [phone]  Nicolas Emmie Caldron 01/11/2024, 2:56 PM

## 2024-01-16 NOTE — Telephone Encounter (Signed)
 Pt needs a follow-up scheduled for her Vyepti . She never called us  back to r/s the 8/27 appt. Please call patient and get her scheduled next available with Megan NP. There should be some availability within the next week. We now have tomorrow 8/27 at 930 am open.

## 2024-01-16 NOTE — Telephone Encounter (Signed)
 Pt called and accepted the office visit slot for 8-27 with Duwaine, NP . Pt made aware of arrival time of 9:30

## 2024-01-17 ENCOUNTER — Encounter: Payer: Self-pay | Admitting: Adult Health

## 2024-01-17 ENCOUNTER — Ambulatory Visit (INDEPENDENT_AMBULATORY_CARE_PROVIDER_SITE_OTHER): Admitting: Adult Health

## 2024-01-17 ENCOUNTER — Ambulatory Visit: Payer: Medicare Other | Admitting: Adult Health

## 2024-01-17 VITALS — BP 137/83 | HR 55 | Ht 65.0 in | Wt 166.0 lb

## 2024-01-17 DIAGNOSIS — G43711 Chronic migraine without aura, intractable, with status migrainosus: Secondary | ICD-10-CM | POA: Diagnosis not present

## 2024-01-17 DIAGNOSIS — G4733 Obstructive sleep apnea (adult) (pediatric): Secondary | ICD-10-CM

## 2024-01-17 NOTE — Patient Instructions (Addendum)
 Your Plan:  Wean off Propranol- ok to start after vypeti infusion. Decrease 1/2 tablet for 1 week then stop medication  Continue Vyepti  Continue Zonegran   continue sumatriptan  for abortive therapy    Thank you for coming to see us  at [facility]. I hope we have been able to provide you high quality care today.  You may receive a patient satisfaction survey over the next few weeks. We would appreciate your feedback and comments so that we may continue to improve ourselves and the health of our patients.

## 2024-01-17 NOTE — Progress Notes (Signed)
 PATIENT: Terry Shannon DOB: August 25, 1955  REASON FOR VISIT: follow up HISTORY FROM: patient PRIMARY NEUROLOGIST: Dr. Ines   Chief Complaint  Patient presents with   Follow-up    Pt in 5 alone Pt here vyepti  f/u  Pt states 2 headaches in last 2 weeks Pt states  next Vyepti  infusion  due 01/25/2024      HISTORY OF PRESENT ILLNESS: Today 01/17/24:  Terry Shannon is a 68 y.o. female with a history of migraine headaches without aura. Returns today for follow-up.  Overall she feels that her migraines have remained relatively stable.  She does state that in the last couple weeks they have increased in frequency but her next infusion of Vyepti  is due September 4.  She uses sumatriptan  hand with good benefit.  States that she rarely has to take a second dose.  She reports in regards to her CPAP there are some nights she falls asleep without putting it on and then wakes up later and puts it on.  Her download is below  Frequency: 2 migraines since last visit Duration: can last a couple hours after sumatriptan   Aura: no Photophonia: no Phonophobia: yes Nausea: no Vomiting: no Numbness: no Weakness: no Gait and balance: no Cardiac history: no   05/16/23: Terry Shannon is a 68 y.o. female with a history of OSA on CPAP. Returns today for follow-up. Reports that she often goes to sleep without putting the CPAP on but then will put it on when she wakes up to go to the bathroom. //  In regards to her migraines she states that she has not been having many migraines.  She states that she only gets migraines when she is active.  Reports that she has been feeling well so she has not been doing much.  She remains on Vyepti .  She also takes Zonegran  daily for prevention.  She will use sumatriptan  for abortive therapy.  She has propranolol  10 mg to take on an as-needed basis for when she exercises however she has not had to use this recently as she has not been very active.     09/27/22: Terry Shannon  is a 68 y.o. female with a history of OSA on CPAP. Returns today for follow-up. She reports that she has struggled with the CPAP.  The mask is causing sores inside her nose.She currently has the nasal pillows.  Does not want a fullface mask.  Her download is below  In regards to her migraines she feels that Vyepti  has been helpful.  Has had 5 migraines since her last infusion.  She would like to go ahead and bump to the 300 mg in the hopes that it gives her further benefit and she can wean off some of the oral medication.  She reports that she is no longer needing to take the immediate release propranolol  before exercise as the Vyepti  has been helpful      REVIEW OF SYSTEMS: Out of a complete 14 system review of symptoms, the patient complains only of the following symptoms, and all other reviewed systems are negative.   ALLERGIES: Allergies  Allergen Reactions   Codeine Nausea And Vomiting   Hydrocodone Nausea And Vomiting   Ibuprofen Other (See Comments)    MIGRAINES   Iodinated Contrast Media Hives and Itching   Methotrexate Derivatives Other (See Comments)    GI UPSET   Oxycodone  Nausea And Vomiting   Tylenol  [Acetaminophen ] Other (See Comments)    MIGRAINES   Aleve [Naproxen Sodium]  Sulfa Antibiotics    Neosporin [Neomycin-Bacitracin Zn-Polymyx] Swelling    AT SITE    HOME MEDICATIONS: Outpatient Medications Prior to Visit  Medication Sig Dispense Refill   Ascorbic Acid (VITAMIN C PO) Take 6,000 mg by mouth daily.     B Complex-C (SUPER B COMPLEX PO) Take 1 tablet by mouth daily.     Cholecalciferol  (VITAMIN D3 PO) Take 1 tablet by mouth daily.      cyclobenzaprine  (FLEXERIL ) 10 MG tablet Take 10 mg by mouth 3 (three) times daily as needed for muscle spasms. (Patient taking differently: Take 10 mg by mouth as needed for muscle spasms.)     desvenlafaxine (PRISTIQ) 100 MG 24 hr tablet Take 100 mg by mouth daily.      Flax OIL Take by mouth daily.     gabapentin   (NEURONTIN ) 300 MG capsule Take 300 mg by mouth 4 (four) times daily.      golimumab  (SIMPONI  ARIA) 50 MG/4ML SOLN injection See admin instructions.     levothyroxine (SYNTHROID) 50 MCG tablet Take 50 mcg by mouth daily before breakfast.     mesalamine  (LIALDA ) 1.2 G EC tablet Take 2.4 g by mouth 2 (two) times daily.      MIEBO 1.338 GM/ML SOLN      oxybutynin  (DITROPAN -XL) 10 MG 24 hr tablet Take 10 mg by mouth daily.     Probiotic Product (PROBIOTIC DAILY PO) Take 1 tablet by mouth daily.      propranolol  (INDERAL ) 10 MG tablet TAKE 1 TAB 30-60 MINUTES PRIOR TO EXERCISE FOR EXERCISE-INDUCED HEADACHES. MAX TWICE DAILY 90 tablet 0   propranolol  (INDERAL ) 20 MG tablet Take 20 mg by mouth 2 (two) times daily.     Rosuvastatin Calcium (CRESTOR PO) Take 20 mg by mouth daily.      SUMAtriptan  (IMITREX ) 100 MG tablet TAKE 1 TABLET BY MOUTH ONCE DAILY AS NEEDED. MAY REPEAT IN 2 HOURS IF HEADACHE PERSISTS OR RECURS. (Patient taking differently: as needed. TAKE 1 TABLET BY MOUTH ONCE DAILY AS NEEDED. MAY REPEAT IN 2 HOURS IF HEADACHE PERSISTS OR RECURS.) 10 tablet 4   thyroid  (ARMOUR) 60 MG tablet Take 60 mg by mouth daily before breakfast.     zolpidem  (AMBIEN  CR) 12.5 MG CR tablet TAKE ONE TABLET BY MOUTH DAILY AT BEDTIME 30 tablet 0   zonisamide  (ZONEGRAN ) 100 MG capsule TAKE 1 CAPSULE (100 MG TOTAL) BY MOUTH DAILY. FOR MIGRAINE PREVENTION. 90 capsule 1   ALPRAZolam  (XANAX ) 0.5 MG tablet Take only before procedures (injections into the neck and low back) up to 3x a day (Patient not taking: Reported on 01/17/2024) 30 tablet 0   aspirin  EC 325 MG tablet Take 325 mg by mouth 2 (two) times daily as needed for mild pain.  (Patient not taking: Reported on 07/03/2023)     ipratropium (ATROVENT ) 0.02 % nebulizer solution Take 2.5 mLs (0.5 mg total) by nebulization 4 (four) times daily. (Patient not taking: Reported on 07/03/2023) 75 mL 12   naphazoline-pheniramine (NAPHCON-A) 0.025-0.3 % ophthalmic solution Place 1  drop into both eyes 4 (four) times daily as needed for eye irritation. 15 mL 0   RESTASIS MULTIDOSE 0.05 % ophthalmic emulsion Place 1 drop into both eyes 2 (two) times daily.     sucralfate (CARAFATE) 1 g tablet Take 1 g by mouth 4 (four) times daily. (Patient not taking: Reported on 09/27/2022)     No facility-administered medications prior to visit.    PAST MEDICAL HISTORY: Past Medical History:  Diagnosis Date   Allergy     year-round, pt. 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   right   Complication of anesthesia    slow to wake up after gallbladder surgery   Depression    Eczema    ARMS AND HANDS   GERD (gastroesophageal reflux disease)    History of thyroid  cancer    Hypothyroidism    Migraines    Osteoarthritis    Overactive bladder    RA (rheumatoid arthritis) (HCC)    Sleep apnea    no CPAP use   Squamous cell carcinoma of skin 12/21/2017   in situ-mid chest (CX35FU)   Ulcerative colitis (HCC)     PAST SURGICAL HISTORY: Past Surgical History:  Procedure Laterality Date   ABDOMINAL HERNIA REPAIR  03/13/2008   periumbilical ventral hernia/incisional hernia   BUNIONECTOMY Right    x 2 more   BUNIONECTOMY Left    BUNIONECTOMY WITH CHILECTOMY Right 12/16/2005   CARPAL TUNNEL RELEASE Right 07/13/2001   CARPAL TUNNEL RELEASE Left 08/10/2001   DILATION AND CURETTAGE OF UTERUS     HAMMER TOE SURGERY Left may 2014   HYSTEROSCOPY WITH D & C  03/01/2004   with exc. of endometrial polyp   KNEE ARTHROSCOPY Right 2013   LAPAROSCOPIC CHOLECYSTECTOMY  11/03/2006   LUMBAR FUSION  03/06/2014   l4  l5    nerve ablation lumbar      THYROIDECTOMY, PARTIAL Right prior to 2002   THYROIDECTOMY, PARTIAL Left 11/29/2000   and isthmus   TONSILLECTOMY  1961   TOTAL ANKLE REPLACEMENT Left 11/2016   with tendon repair   TOTAL KNEE ARTHROPLASTY Right 04/01/2013   Procedure: RIGHT TOTAL KNEE ARTHROPLASTY;  Surgeon: [name], MD;  Location: [facility]  OR;  Service: Orthopedics;  Laterality: Right;    FAMILY HISTORY: Family History  Problem Relation Age of Onset   Stroke Mother    Asthma Mother    Cancer Father 67       lung   Diabetes Brother    Barrett's esophagus Brother    Heart Problems Brother        pacemaker placement   Cancer Maternal Grandmother 42       colon   Stroke Maternal Grandmother    Stroke Maternal Grandfather    Diabetes Paternal Grandmother    Migraines Neg Hx    Breast cancer Neg Hx     SOCIAL HISTORY: Social History   Socioeconomic History   Marital status: Significant Other    Spouse name: Not on file   Number of children: 0   Years of education: Not on file   Highest education level: Bachelor's degree (e.g., BA, AB, BS)  Occupational History   Not on file  Tobacco Use   Smoking status: Never   Smokeless tobacco: Never  Vaping Use   Vaping status: Never Used  Substance and Sexual Activity   Alcohol use: Yes    Comment: occ   Drug use: No   Sexual activity: Not Currently    Birth control/protection: None  Other Topics Concern   Not on file  Social History Narrative   Lives at home with her partner and two daughters   Right handed   Caffeine; none   Retired    Social Drivers of Corporate investment banker Strain: Not on file  Food Insecurity: Low Risk  (02/08/2023)   Received from [facility]   Hunger Vital  Sign    Within the past 12 months, you worried that your food would run out before you got money to buy more: Never true    Within the past 12 months, the food you bought just didn't last and you didn't have money to get more. : Never true  Transportation Needs: No Transportation Needs (02/08/2023)   Received from [facility]    In the past 12 months, has lack of reliable transportation kept you from medical appointments, meetings, work or from getting things needed for daily living? : No  Physical Activity: Not on file  Stress: Not on file  Social  Connections: Not on file  Intimate Partner Violence: Not on file      PHYSICAL EXAM  Vitals:   01/17/24 1004  BP: 137/83  Pulse: (!) 55  Weight: 166 lb (75.3 kg)  Height: 5' 5 (1.651 m)    Body mass index is 27.62 kg/m.  Generalized: Well developed, in no acute distress  Chest: Lungs clear to auscultation bilaterally  Neurological examination  Mentation: Alert oriented to time, place, history taking. Follows all commands speech and language fluent Cranial nerve II-XII: Extraocular movements were full, visual field were full on confrontational test Head turning and shoulder shrug  were normal and symmetric. Motor: The motor testing reveals 5 over 5 strength of all 4 extremities. Good symmetric motor tone is noted throughout.  Sensory: Sensory testing is intact to soft touch on all 4 extremities. No evidence of extinction is noted.  Gait and station: Gait is normal.    DIAGNOSTIC DATA (LABS, IMAGING, TESTING) - I reviewed patient records, labs, notes, testing and imaging myself where available.  Lab Results  Component Value Date   WBC 7.2 05/22/2023   HGB 13.5 05/22/2023   HCT 40.4 05/22/2023   MCV 89.8 05/22/2023   PLT 268 05/22/2023      Component Value Date/Time   NA 140 05/22/2023 1212   NA 142 07/01/2019 0926   K 4.1 05/22/2023 1212   CL 109 05/22/2023 1212   CO2 23 05/22/2023 1212   GLUCOSE 109 (H) 05/22/2023 1212   BUN 18 05/22/2023 1212   BUN 14 07/01/2019 0926   CREATININE 0.45 05/22/2023 1212   CALCIUM 9.1 05/22/2023 1212   PROT 7.1 02/28/2014 1418   ALBUMIN  3.7 02/28/2014 1418   AST 20 02/28/2014 1418   ALT 36 (H) 02/28/2014 1418   ALKPHOS 99 02/28/2014 1418   BILITOT <0.2 (L) 02/28/2014 1418   GFRNONAA >60 05/22/2023 1212   GFRAA 115 07/01/2019 0926       ASSESSMENT AND PLAN 68 y.o. year old female  has a past medical history of Allergy , Chronic lower back pain, CMC arthritis, thumb, degenerative (06/2013), Complication of anesthesia,  Depression, Eczema, GERD (gastroesophageal reflux disease), History of thyroid  cancer, Hypothyroidism, Migraines, Osteoarthritis, Overactive bladder, RA (rheumatoid arthritis) (HCC), Sleep apnea, Squamous cell carcinoma of skin (12/21/2017), and Ulcerative colitis (HCC). here with:  OSA on CPAP  - CPAP compliance Suboptimal - Good treatment of AHI  - Encourage patient to use CPAP nightly and > 4 hours each night - Advised that she could try to set an alarm to remind her to put the CPAP machine on.  2.  Migraine headaches  -Continue Vyepti  to 300 mg -Continue sumatriptan  for abortive therapy -Continue Zonegran  100 mg at bedtime for prevention - She is on propranolol  but would like to wean off of this medication.  She will start weaning off after  she has her Vyepti  infusion.  Advised that she can decrease to 10 mg for 1 week then stop the medication  - F/U in 7-8 months  or sooner if needed    Duwaine Russell, MSN, NP-C 01/17/2024, 10:05 AM [facility] [address] [place], [place] [address] [phone]  The patient's condition requires frequent monitoring and adjustments in the treatment plan, reflecting the ongoing complexity of care.  This provider is the continuing focal point for all needed services for this condition.

## 2024-01-24 DIAGNOSIS — K51011 Ulcerative (chronic) pancolitis with rectal bleeding: Secondary | ICD-10-CM | POA: Diagnosis not present

## 2024-01-25 DIAGNOSIS — G43711 Chronic migraine without aura, intractable, with status migrainosus: Secondary | ICD-10-CM | POA: Diagnosis not present

## 2024-02-06 ENCOUNTER — Encounter (INDEPENDENT_AMBULATORY_CARE_PROVIDER_SITE_OTHER): Payer: Self-pay

## 2024-02-22 DIAGNOSIS — I70203 Unspecified atherosclerosis of native arteries of extremities, bilateral legs: Secondary | ICD-10-CM | POA: Diagnosis not present

## 2024-02-22 DIAGNOSIS — Q828 Other specified congenital malformations of skin: Secondary | ICD-10-CM | POA: Diagnosis not present

## 2024-02-22 DIAGNOSIS — M792 Neuralgia and neuritis, unspecified: Secondary | ICD-10-CM | POA: Diagnosis not present

## 2024-02-22 DIAGNOSIS — D2372 Other benign neoplasm of skin of left lower limb, including hip: Secondary | ICD-10-CM | POA: Diagnosis not present

## 2024-02-22 DIAGNOSIS — I739 Peripheral vascular disease, unspecified: Secondary | ICD-10-CM | POA: Diagnosis not present

## 2024-02-22 DIAGNOSIS — M2041 Other hammer toe(s) (acquired), right foot: Secondary | ICD-10-CM | POA: Diagnosis not present

## 2024-02-22 DIAGNOSIS — D2371 Other benign neoplasm of skin of right lower limb, including hip: Secondary | ICD-10-CM | POA: Diagnosis not present

## 2024-03-05 DIAGNOSIS — M0579 Rheumatoid arthritis with rheumatoid factor of multiple sites without organ or systems involvement: Secondary | ICD-10-CM | POA: Diagnosis not present

## 2024-03-09 DIAGNOSIS — B3731 Acute candidiasis of vulva and vagina: Secondary | ICD-10-CM | POA: Diagnosis not present

## 2024-03-09 DIAGNOSIS — R399 Unspecified symptoms and signs involving the genitourinary system: Secondary | ICD-10-CM | POA: Diagnosis not present

## 2024-03-09 DIAGNOSIS — N390 Urinary tract infection, site not specified: Secondary | ICD-10-CM | POA: Diagnosis not present

## 2024-03-11 DIAGNOSIS — R5382 Chronic fatigue, unspecified: Secondary | ICD-10-CM | POA: Diagnosis not present

## 2024-03-11 DIAGNOSIS — M3501 Sicca syndrome with keratoconjunctivitis: Secondary | ICD-10-CM | POA: Diagnosis not present

## 2024-03-11 DIAGNOSIS — M0579 Rheumatoid arthritis with rheumatoid factor of multiple sites without organ or systems involvement: Secondary | ICD-10-CM | POA: Diagnosis not present

## 2024-03-11 DIAGNOSIS — M1991 Primary osteoarthritis, unspecified site: Secondary | ICD-10-CM | POA: Diagnosis not present

## 2024-03-11 DIAGNOSIS — Z6825 Body mass index (BMI) 25.0-25.9, adult: Secondary | ICD-10-CM | POA: Diagnosis not present

## 2024-03-11 DIAGNOSIS — E663 Overweight: Secondary | ICD-10-CM | POA: Diagnosis not present

## 2024-03-14 ENCOUNTER — Ambulatory Visit (INDEPENDENT_AMBULATORY_CARE_PROVIDER_SITE_OTHER)

## 2024-03-14 ENCOUNTER — Institutional Professional Consult (permissible substitution) (INDEPENDENT_AMBULATORY_CARE_PROVIDER_SITE_OTHER): Admitting: Otolaryngology

## 2024-03-14 ENCOUNTER — Encounter (INDEPENDENT_AMBULATORY_CARE_PROVIDER_SITE_OTHER): Payer: Self-pay

## 2024-03-14 VITALS — BP 113/87 | HR 65 | Ht 65.0 in | Wt 162.0 lb

## 2024-03-14 DIAGNOSIS — J383 Other diseases of vocal cords: Secondary | ICD-10-CM | POA: Diagnosis not present

## 2024-03-14 DIAGNOSIS — H6990 Unspecified Eustachian tube disorder, unspecified ear: Secondary | ICD-10-CM

## 2024-03-14 DIAGNOSIS — R053 Chronic cough: Secondary | ICD-10-CM | POA: Diagnosis not present

## 2024-03-14 DIAGNOSIS — J300 Vasomotor rhinitis: Secondary | ICD-10-CM

## 2024-03-14 DIAGNOSIS — H6991 Unspecified Eustachian tube disorder, right ear: Secondary | ICD-10-CM

## 2024-03-14 MED ORDER — IPRATROPIUM BROMIDE 0.03 % NA SOLN: 2.0000 | Freq: Two times a day (BID) | NASAL | 12 refills | Status: DC

## 2024-03-14 NOTE — Progress Notes (Unsigned)
 HPI:   Terry Shannon is a 68 y.o. female who presents as a new patient being seen in evaluation for chronic cough and hoarseness.  Patient states that the chronic cough and throat clearing has been present for approximately 2 to 3 years.  She states that she has seen multiple ENT doctors for this and has optimized her reflux regimen using both Protonix  and famotidine .  She states that she coughs up phlegm frequently.  She does use a sinus rinse daily.  She does not use any other nasal sprays.  She denies any pain in the throat.  She denies any facial pain or pressure.  She does use a humidifier.  She also states she has occasional nosebleeds.  She states that whenever she is prescribed antibiotics it helps clear up the drainage.  She states that she does use Afrin 1-2 times monthly when she has significant nasal congestion.  She states she drinks 32 ounces of water  daily.  She states she has had 2 thyroid  surgeries in the early 2000's after which she did not note any change in voice or difficulty swallowing.  She also notes that she has water  in her right ear which needs to be drained with a tube.  She states that she had an ear tube placed by another ENT in her right ear.  She denies any ear pain at this time.    PMH/Meds/All/SocHx/FamHx/ROS: Past Medical History:  Diagnosis Date   Allergy     year-round, pt. 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   right   Complication of anesthesia    slow to wake up after gallbladder surgery   Depression    Eczema    ARMS AND HANDS   GERD (gastroesophageal reflux disease)    History of thyroid  cancer    Hypothyroidism    Migraines    Osteoarthritis    Overactive bladder    RA (rheumatoid arthritis) (HCC)    Sleep apnea    no CPAP use   Squamous cell carcinoma of skin 12/21/2017   in situ-mid chest (CX35FU)   Ulcerative colitis [facility])    Past Surgical History:  Procedure Laterality Date   ABDOMINAL HERNIA  REPAIR  03/13/2008   periumbilical ventral hernia/incisional hernia   BUNIONECTOMY Right    x 2 more   BUNIONECTOMY Left    BUNIONECTOMY WITH CHILECTOMY Right 12/16/2005   CARPAL TUNNEL RELEASE Right 07/13/2001   CARPAL TUNNEL RELEASE Left 08/10/2001   DILATION AND CURETTAGE OF UTERUS     HAMMER TOE SURGERY Left may 2014   HYSTEROSCOPY WITH D & C  03/01/2004   with exc. of endometrial polyp   KNEE ARTHROSCOPY Right 2013   LAPAROSCOPIC CHOLECYSTECTOMY  11/03/2006   LUMBAR FUSION  03/06/2014   l4  l5    nerve ablation lumbar      THYROIDECTOMY, PARTIAL Right prior to 2002   THYROIDECTOMY, PARTIAL Left 11/29/2000   and isthmus   TONSILLECTOMY  1961   TOTAL ANKLE REPLACEMENT Left 11/2016   with tendon repair   TOTAL KNEE ARTHROPLASTY Right 04/01/2013   Procedure: RIGHT TOTAL KNEE ARTHROPLASTY;  Surgeon: [name], MD;  Location: [facility] OR;  Service: Orthopedics;  Laterality: Right;   No family history of bleeding disorders, wound healing problems or difficulty with anesthesia.  Social Connections: Not on file    Current Outpatient Medications:    Ascorbic Acid (VITAMIN C PO), Take 6,000 mg by mouth daily., Disp: ,  Rfl:    B Complex-C (SUPER B COMPLEX PO), Take 1 tablet by mouth daily., Disp: , Rfl:    cephALEXin  (KEFLEX ) 500 MG capsule, Take 500 mg by mouth every 6 (six) hours., Disp: , Rfl:    Cholecalciferol  (VITAMIN D3 PO), Take 1 tablet by mouth daily. , Disp: , Rfl:    cyclobenzaprine  (FLEXERIL ) 10 MG tablet, Take 10 mg by mouth 3 (three) times daily as needed for muscle spasms. (Patient taking differently: Take 10 mg by mouth as needed for muscle spasms.), Disp: , Rfl:    desvenlafaxine (PRISTIQ) 100 MG 24 hr tablet, Take 100 mg by mouth daily. , Disp: , Rfl:    famotidine  (PEPCID ) 40 MG tablet, Take 40 mg by mouth 2 (two) times daily., Disp: , Rfl:    Flax OIL, Take by mouth daily., Disp: , Rfl:    gabapentin  (NEURONTIN ) 300 MG capsule, Take 300 mg by mouth 4 (four) times daily.  , Disp: , Rfl:    golimumab  (SIMPONI  ARIA) 50 MG/4ML SOLN injection, See admin instructions., Disp: , Rfl:    ipratropium (ATROVENT ) 0.02 % nebulizer solution, Take 2.5 mLs (0.5 mg total) by nebulization 4 (four) times daily., Disp: 75 mL, Rfl: 12   levothyroxine (SYNTHROID) 50 MCG tablet, Take 50 mcg by mouth daily before breakfast., Disp: , Rfl:    losartan (COZAAR) 50 MG tablet, Take 1 tablet by mouth every day for blood pressure, Disp: , Rfl:    mesalamine  (LIALDA ) 1.2 G EC tablet, Take 2.4 g by mouth 2 (two) times daily. , Disp: , Rfl:    MIEBO 1.338 GM/ML SOLN, , Disp: , Rfl:    naphazoline-pheniramine (NAPHCON-A) 0.025-0.3 % ophthalmic solution, Place 1 drop into both eyes 4 (four) times daily as needed for eye irritation., Disp: 15 mL, Rfl: 0   oxybutynin  (DITROPAN -XL) 10 MG 24 hr tablet, Take 10 mg by mouth daily., Disp: , Rfl:    pantoprazole  (PROTONIX ) 40 MG tablet, Oral; Duration: 90 Days, Disp: , Rfl:    Probiotic Product (PROBIOTIC DAILY PO), Take 1 tablet by mouth daily. , Disp: , Rfl:    propranolol  (INDERAL ) 10 MG tablet, TAKE 1 TAB 30-60 MINUTES PRIOR TO EXERCISE FOR EXERCISE-INDUCED HEADACHES. MAX TWICE DAILY, Disp: 90 tablet, Rfl: 0   propranolol  (INDERAL ) 20 MG tablet, Take 20 mg by mouth 2 (two) times daily., Disp: , Rfl:    RESTASIS MULTIDOSE 0.05 % ophthalmic emulsion, Place 1 drop into both eyes 2 (two) times daily., Disp: , Rfl:    Rosuvastatin Calcium (CRESTOR PO), Take 20 mg by mouth daily. , Disp: , Rfl:    SUMAtriptan  (IMITREX ) 100 MG tablet, TAKE 1 TABLET BY MOUTH ONCE DAILY AS NEEDED. MAY REPEAT IN 2 HOURS IF HEADACHE PERSISTS OR RECURS. (Patient taking differently: as needed. TAKE 1 TABLET BY MOUTH ONCE DAILY AS NEEDED. MAY REPEAT IN 2 HOURS IF HEADACHE PERSISTS OR RECURS.), Disp: 10 tablet, Rfl: 4   thyroid  (ARMOUR) 60 MG tablet, Take 60 mg by mouth daily before breakfast., Disp: , Rfl:    zolpidem  (AMBIEN  CR) 12.5 MG CR tablet, TAKE ONE TABLET BY MOUTH DAILY AT  BEDTIME, Disp: 30 tablet, Rfl: 0   zonisamide  (ZONEGRAN ) 100 MG capsule, TAKE 1 CAPSULE (100 MG TOTAL) BY MOUTH DAILY. FOR MIGRAINE PREVENTION., Disp: 90 capsule, Rfl: 1   ALPRAZolam  (XANAX ) 0.5 MG tablet, Take only before procedures (injections into the neck and low back) up to 3x a day (Patient not taking: Reported on 03/14/2024), Disp: 30  tablet, Rfl: 0   aspirin  EC 325 MG tablet, Take 325 mg by mouth 2 (two) times daily as needed for mild pain.  (Patient not taking: Reported on 03/14/2024), Disp: , Rfl:    sucralfate (CARAFATE) 1 g tablet, Take 1 g by mouth 4 (four) times daily. (Patient not taking: Reported on 03/14/2024), Disp: , Rfl:  A complete ROS was performed with pertinent positives/negatives noted in the HPI. The remainder of the ROS are negative.   Physical Exam:  BP 113/87 (Patient Position: Sitting, Cuff Size: Normal)   Pulse 65   Ht 5' 5 (1.651 m)   Wt 162 lb (73.5 kg)   SpO2 98%   BMI 26.96 kg/m  General: Well developed, well nourished. No acute distress. Voice hoarse, breathy Head/Face: Normocephalic. No sinus tenderness. Facial nerve intact and equal bilaterally. No facial lacerations. Eyes: PERRL, no scleral icterus or conjunctival hemorrhage. EOMI. Ears: No gross deformity. Normal external canal. Tympanic membrane in tact on the left. Right TM with paparella tube in place and obstructed with cerumen Hearing: Normal speech reception.  Nose: No gross deformity or lesions. No purulent discharge. No turbinate hypertrophy. Mouth/Oropharynx: Lips without any lesions. Dentition good. No mucosal lesions within the oropharynx. No tonsillar enlargement, exudate, or lesions. Pharyngeal walls symmetrical. Uvula midline. Tongue midline without lesions. Larynx: See TFL if applicable Nasopharynx: See TFL if applicable Neck: Trachea midline. No masses. No thyromegaly or nodules palpated. No crepitus. Lymphatic: No lymphadenopathy in the neck. Respiratory: No stridor or distress. Room  air. Cardiovascular: Regular rate and rhythm. Extremities: No edema or cyanosis. Warm and well-perfused. Skin: No scars or lesions on face or neck. Neurologic: CN II-XII grossly intact. Moving all extremities without gross abnormality. Other:  Independent Review of Additional Tests or Records: Swallow study 01/11/2024 - vestibular penetration with thin and nectar thick consistencies without aspiration   Procedures:  Flexible Fiberoptic Laryngoscopy Procedure Note  Date of procedure 03/14/2024 Pre-Op Diagnosis: chronic cough    Post Op Diagnosis: same Procedure: Flexible Fiberoptic Laryngoscopy CPT 31575 - Mod 25 Surgeon: Adah Malkin, D.O. Anesthesia: 4% lidocaine  with afrin Findings:  - bilateral vocal cord atrophy - no masses or lesions Procedure Detail: After verbal consent was obtained from the patient, the patient was brought in an upright position, a fiberoptic nasal laryngoscope was then passed into the patient right nasal passage and left nasal passage, the left appeared to be more patent. It was passed along the floor of the nasal cavity to the nasopharynx. Torus tubarius was patent and the Fossa of Rosenmller was identified. The scope was then flexed caudally and advanced slowly through the nasopharynx, passed through the oropharynx, and down into the hypopharynx. The patient's oro- and nasopharynx were unremarkable with no signs of any gross lesions, edema, masses, or bleeding.  The base of tongue was visualized and no mass, ulceration or lesion was appreciated. The epiglottis did not demonstrate any mass, ulceration or lesion. The vallecula was also assessed with no mass, ulceration or lesion. The patient had good glottic closure upon phonation and no signs of aspiration or pooling of secretions. The patient was asked to inspire and expire with the true vocal folds vibrating normally and without evidence of vocal fold dysfunction. Bilateral true vocal cords were noted to be thin  and atrophic. The true and false vocal cords, interarytenoid, AE folds, and arytenoids did not demonstrate any significant edema or erythema. The patient was then asked to valsalva, and the pyriform sinuses were assessed which were unremarkable. The  airway was patent and there was no evidence of compromise. The scope was then slowly withdrawn from the patient. The patient tolerated the procedure well and there were no complications.  Disposition: Stable   Impression & Plans: Terry Shannon is a 68 y.o. female with chronic cough and right ear fullness.  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  Discussed risks, benefits, alternatives of the procedure.  Vasomotor rhinitis - Start Atrovent  twice daily  Eustachian tube dysfunction - Right TM with nonfunctional ear tube noted on exam - Will plan to replace ear tube in OR   Follow-up after procedure in OR.   Adah Malkin, DO [facility]

## 2024-03-26 ENCOUNTER — Other Ambulatory Visit (INDEPENDENT_AMBULATORY_CARE_PROVIDER_SITE_OTHER): Payer: Self-pay

## 2024-03-26 DIAGNOSIS — H6521 Chronic serous otitis media, right ear: Secondary | ICD-10-CM | POA: Diagnosis not present

## 2024-03-26 DIAGNOSIS — J387 Other diseases of larynx: Secondary | ICD-10-CM | POA: Diagnosis not present

## 2024-03-26 DIAGNOSIS — R053 Chronic cough: Secondary | ICD-10-CM | POA: Diagnosis not present

## 2024-03-26 DIAGNOSIS — J383 Other diseases of vocal cords: Secondary | ICD-10-CM | POA: Diagnosis not present

## 2024-03-26 DIAGNOSIS — H6991 Unspecified Eustachian tube disorder, right ear: Secondary | ICD-10-CM

## 2024-03-26 MED ORDER — CIPROFLOXACIN-DEXAMETHASONE 0.3-0.1 % OT SUSP: 4.0000 [drp] | Freq: Two times a day (BID) | OTIC | 0 refills | Status: AC

## 2024-04-01 ENCOUNTER — Other Ambulatory Visit (INDEPENDENT_AMBULATORY_CARE_PROVIDER_SITE_OTHER): Payer: Self-pay

## 2024-04-01 ENCOUNTER — Ambulatory Visit (INDEPENDENT_AMBULATORY_CARE_PROVIDER_SITE_OTHER)

## 2024-04-01 VITALS — BP 134/83 | Temp 98.0°F | Wt 162.0 lb

## 2024-04-01 DIAGNOSIS — J309 Allergic rhinitis, unspecified: Secondary | ICD-10-CM

## 2024-04-01 DIAGNOSIS — J383 Other diseases of vocal cords: Secondary | ICD-10-CM

## 2024-04-01 DIAGNOSIS — R053 Chronic cough: Secondary | ICD-10-CM

## 2024-04-01 DIAGNOSIS — J040 Acute laryngitis: Secondary | ICD-10-CM

## 2024-04-01 DIAGNOSIS — H6991 Unspecified Eustachian tube disorder, right ear: Secondary | ICD-10-CM

## 2024-04-01 MED ORDER — LEVOCETIRIZINE DIHYDROCHLORIDE 5 MG PO TABS: 5.0000 mg | ORAL_TABLET | Freq: Every evening | ORAL | 2 refills | Status: DC

## 2024-04-01 MED ORDER — AZELASTINE HCL 0.1 % NA SOLN: 2.0000 | Freq: Two times a day (BID) | NASAL | 12 refills | Status: AC

## 2024-04-01 MED ORDER — MOMETASONE FUROATE 50 MCG/ACT NA SUSP: 2.0000 | Freq: Every day | NASAL | 12 refills | Status: DC

## 2024-04-01 MED ORDER — DOXYCYCLINE HYCLATE 100 MG PO TABS: 100.0000 mg | ORAL_TABLET | Freq: Two times a day (BID) | ORAL | 0 refills | Status: AC

## 2024-04-01 MED ORDER — TRIAMCINOLONE ACETONIDE 55 MCG/ACT NA AERO: 2.0000 | INHALATION_SPRAY | Freq: Every day | NASAL | 12 refills | Status: AC

## 2024-04-01 NOTE — Progress Notes (Signed)
 HPI:  Discussed the use of AI scribe software for clinical note transcription with the patient, who gave verbal consent to proceed.  History of Present Illness Terry Shannon is a 68 year old female with chronic cough and throat clearing who presents with persistent symptoms.  She has a persistent cough and frequent throat clearing, accompanied by mucus production that varies in color from clear to brown. There has been no improvement in the cough over time. She is currently on gabapentin  for neuropathy, taking 300 mg four times a day, and uses reflux medication daily.  She states her right ear hearing has improved since placement of the ear tube. She denies any other symptoms in her right ear.   She was using atrovent  but discontinued due to epistaxis. She feels her nasal obstruction and congestion is worse at night which prevents her from using CPAP. She has not experienced appetite loss and maintains normal eating and drinking habits. She states she is scheduled to see speech therapy tomorrow.     PMH/Meds/All/SocHx/FamHx/ROS: Past Medical History:  Diagnosis Date   Allergy     year-round, pt. 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   right   Complication of anesthesia    slow to wake up after gallbladder surgery   Depression    Eczema    ARMS AND HANDS   GERD (gastroesophageal reflux disease)    History of thyroid  cancer    Hypothyroidism    Migraines    Osteoarthritis    Overactive bladder    RA (rheumatoid arthritis) (HCC)    Sleep apnea    no CPAP use   Squamous cell carcinoma of skin 12/21/2017   in situ-mid chest (CX35FU)   Ulcerative colitis [facility])    Past Surgical History:  Procedure Laterality Date   ABDOMINAL HERNIA REPAIR  03/13/2008   periumbilical ventral hernia/incisional hernia   BUNIONECTOMY Right    x 2 more   BUNIONECTOMY Left    BUNIONECTOMY WITH CHILECTOMY Right 12/16/2005   CARPAL TUNNEL RELEASE Right 07/13/2001    CARPAL TUNNEL RELEASE Left 08/10/2001   DILATION AND CURETTAGE OF UTERUS     HAMMER TOE SURGERY Left may 2014   HYSTEROSCOPY WITH D & C  03/01/2004   with exc. of endometrial polyp   KNEE ARTHROSCOPY Right 2013   LAPAROSCOPIC CHOLECYSTECTOMY  11/03/2006   LUMBAR FUSION  03/06/2014   l4  l5    nerve ablation lumbar      THYROIDECTOMY, PARTIAL Right prior to 2002   THYROIDECTOMY, PARTIAL Left 11/29/2000   and isthmus   TONSILLECTOMY  1961   TOTAL ANKLE REPLACEMENT Left 11/2016   with tendon repair   TOTAL KNEE ARTHROPLASTY Right 04/01/2013   Procedure: RIGHT TOTAL KNEE ARTHROPLASTY;  Surgeon: [name], MD;  Location: [facility] OR;  Service: Orthopedics;  Laterality: Right;   No family history of bleeding disorders, wound healing problems or difficulty with anesthesia.  Social Connections: Not on file    Current Outpatient Medications:    Ascorbic Acid (VITAMIN C PO), Take 6,000 mg by mouth daily., Disp: , Rfl:    azelastine  (ASTELIN ) 0.1 % nasal spray, Place 2 sprays into both nostrils 2 (two) times daily. Use in each nostril as directed, Disp: 30 mL, Rfl: 12   B Complex-C (SUPER B COMPLEX PO), Take 1 tablet by mouth daily., Disp: , Rfl:    cephALEXin  (KEFLEX ) 500 MG capsule, Take 500 mg by mouth every 6 (  six) hours., Disp: , Rfl:    Cholecalciferol  (VITAMIN D3 PO), Take 1 tablet by mouth daily. , Disp: , Rfl:    cyclobenzaprine  (FLEXERIL ) 10 MG tablet, Take 10 mg by mouth 3 (three) times daily as needed for muscle spasms. (Patient taking differently: Take 10 mg by mouth as needed for muscle spasms.), Disp: , Rfl:    desvenlafaxine (PRISTIQ) 100 MG 24 hr tablet, Take 100 mg by mouth daily. , Disp: , Rfl:    doxycycline  (VIBRA -TABS) 100 MG tablet, Take 1 tablet (100 mg total) by mouth 2 (two) times daily for 7 days., Disp: 14 tablet, Rfl: 0   famotidine  (PEPCID ) 40 MG tablet, Take 40 mg by mouth 2 (two) times daily., Disp: , Rfl:    Flax OIL, Take by mouth daily., Disp: , Rfl:     gabapentin  (NEURONTIN ) 300 MG capsule, Take 300 mg by mouth 4 (four) times daily. , Disp: , Rfl:    golimumab  (SIMPONI  ARIA) 50 MG/4ML SOLN injection, See admin instructions., Disp: , Rfl:    ipratropium (ATROVENT ) 0.02 % nebulizer solution, Take 2.5 mLs (0.5 mg total) by nebulization 4 (four) times daily., Disp: 75 mL, Rfl: 12   levocetirizine (XYZAL) 5 MG tablet, Take 1 tablet (5 mg total) by mouth every evening., Disp: 30 tablet, Rfl: 2   levothyroxine (SYNTHROID) 50 MCG tablet, Take 50 mcg by mouth daily before breakfast., Disp: , Rfl:    losartan (COZAAR) 50 MG tablet, Take 1 tablet by mouth every day for blood pressure, Disp: , Rfl:    mesalamine  (LIALDA ) 1.2 G EC tablet, Take 2.4 g by mouth 2 (two) times daily. , Disp: , Rfl:    MIEBO 1.338 GM/ML SOLN, , Disp: , Rfl:    mometasone (NASONEX) 50 MCG/ACT nasal spray, Place 2 sprays into the nose daily., Disp: 1 each, Rfl: 12   naphazoline-pheniramine (NAPHCON-A) 0.025-0.3 % ophthalmic solution, Place 1 drop into both eyes 4 (four) times daily as needed for eye irritation., Disp: 15 mL, Rfl: 0   oxybutynin  (DITROPAN -XL) 10 MG 24 hr tablet, Take 10 mg by mouth daily., Disp: , Rfl:    pantoprazole  (PROTONIX ) 40 MG tablet, Oral; Duration: 90 Days, Disp: , Rfl:    Probiotic Product (PROBIOTIC DAILY PO), Take 1 tablet by mouth daily. , Disp: , Rfl:    propranolol  (INDERAL ) 10 MG tablet, TAKE 1 TAB 30-60 MINUTES PRIOR TO EXERCISE FOR EXERCISE-INDUCED HEADACHES. MAX TWICE DAILY, Disp: 90 tablet, Rfl: 0   propranolol  (INDERAL ) 20 MG tablet, Take 20 mg by mouth 2 (two) times daily., Disp: , Rfl:    RESTASIS MULTIDOSE 0.05 % ophthalmic emulsion, Place 1 drop into both eyes 2 (two) times daily., Disp: , Rfl:    Rosuvastatin Calcium (CRESTOR PO), Take 20 mg by mouth daily. , Disp: , Rfl:    SUMAtriptan  (IMITREX ) 100 MG tablet, TAKE 1 TABLET BY MOUTH ONCE DAILY AS NEEDED. MAY REPEAT IN 2 HOURS IF HEADACHE PERSISTS OR RECURS. (Patient taking differently: as  needed. TAKE 1 TABLET BY MOUTH ONCE DAILY AS NEEDED. MAY REPEAT IN 2 HOURS IF HEADACHE PERSISTS OR RECURS.), Disp: 10 tablet, Rfl: 4   thyroid  (ARMOUR) 60 MG tablet, Take 60 mg by mouth daily before breakfast., Disp: , Rfl:    zolpidem  (AMBIEN  CR) 12.5 MG CR tablet, TAKE ONE TABLET BY MOUTH DAILY AT BEDTIME, Disp: 30 tablet, Rfl: 0   zonisamide  (ZONEGRAN ) 100 MG capsule, TAKE 1 CAPSULE (100 MG TOTAL) BY MOUTH DAILY. FOR MIGRAINE PREVENTION., Disp:  90 capsule, Rfl: 1   ALPRAZolam  (XANAX ) 0.5 MG tablet, Take only before procedures (injections into the neck and low back) up to 3x a day (Patient not taking: Reported on 04/01/2024), Disp: 30 tablet, Rfl: 0   aspirin  EC 325 MG tablet, Take 325 mg by mouth 2 (two) times daily as needed for mild pain.  (Patient not taking: Reported on 04/01/2024), Disp: , Rfl:    sucralfate (CARAFATE) 1 g tablet, Take 1 g by mouth 4 (four) times daily. (Patient not taking: Reported on 04/01/2024), Disp: , Rfl:  A complete ROS was performed with pertinent positives/negatives noted in the HPI. The remainder of the ROS are negative.   Physical Exam:  BP 134/83 (BP Location: Right Arm, Patient Position: Sitting, Cuff Size: Normal)   Temp 98 F (36.7 C)   Wt 162 lb (73.5 kg)   BMI 26.96 kg/m  General: Well developed, well nourished. No acute distress. Voice hoarse Head/Face: Normocephalic. No sinus tenderness. Facial nerve intact and equal bilaterally. No facial lacerations. Eyes: PERRL, no scleral icterus or conjunctival hemorrhage. EOMI. Ears: No gross deformity. Normal external canal. Tympanic membrane left in tact and right with tube in place Hearing: Normal speech reception.  Nose: No gross deformity or lesions. No purulent discharge. No turbinate hypertrophy. Mouth/Oropharynx: Lips without any lesions. Dentition good. No mucosal lesions within the oropharynx. No tonsillar enlargement, exudate, or lesions. Pharyngeal walls symmetrical. Uvula midline. Tongue midline  without lesions. Larynx: See TFL if applicable Nasopharynx: See TFL if applicable Neck: Trachea midline. No masses. No thyromegaly or nodules palpated. No crepitus. Lymphatic: No lymphadenopathy in the neck. Respiratory: No stridor or distress. Room air. Cardiovascular: Regular rate and rhythm. Extremities: No edema or cyanosis. Warm and well-perfused. Skin: No scars or lesions on face or neck. Neurologic: CN II-XII grossly intact. Moving all extremities without gross abnormality. Other:  Independent Review of Additional Tests or Records: None Procedures: Flexible Fiberoptic Laryngoscopy Procedure Note  Date of procedure 04/01/2024 Pre-Op Diagnosis: chronic cough    Post Op Diagnosis: same Procedure: Flexible Fiberoptic Laryngoscopy CPT 31575 - Mod 25 Surgeon: Adah Malkin, D.O. 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 It was passed along the floor of the nasal cavity to the nasopharynx. Torus tubarius was patent and the Fossa of Rosenmller was identified. The scope was then flexed caudally and advanced slowly through the nasopharynx, passed through the oropharynx, and down into the hypopharynx. The patient's oro- and nasopharynx were noted to have mucopurulent drainage.   The base of tongue was visualized and no mass, ulceration or lesion was appreciated. The epiglottis did not demonstrate any mass, ulceration or lesion. The vallecula was also assessed with no mass, ulceration or lesion. The patient had good glottic closure upon phonation and no  signs of aspiration or pooling of secretions. 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 The patient was asked to inspire and expire with the true vocal folds vibrating normally and without evidence of vocal fold dysfunction. The true and false vocal cords, interarytenoid, AE folds, and arytenoids did not demonstrate any  significant edema or erythema. The patient was then asked to valsalva, and the pyriform sinuses were assessed which were unremarkable. The airway was patent and there was no evidence of compromise. The scope was then slowly withdrawn from the patient. The patient tolerated the procedure well and there were no complications.  Disposition: Stable  Impression & Plans:  Assessment and Plan Assessment & Plan Chronic cough with mucus production and possible upper respiratory infection - Start doxycycline  100 mg BID x 7 days - Start Voice therapy - scheduled for tomorrow  Allergic rhinitis with nasal congestion Nasal congestion affecting CPAP use. Ipratropium bromide  caused epistaxis, Flonase  caused migraines. - Prescribed Xyzal (levocetirizine) 5 mg at night. - Prescribed Nasonex (mometasone furoate) nasal spray once daily in the morning. - Prescribed azelastine  nasal spray twice daily, in the morning and at night.    Follow-up as needed.  Adah Malkin, DO [facility]

## 2024-04-03 ENCOUNTER — Ambulatory Visit

## 2024-04-03 ENCOUNTER — Other Ambulatory Visit (INDEPENDENT_AMBULATORY_CARE_PROVIDER_SITE_OTHER): Payer: Self-pay

## 2024-04-03 ENCOUNTER — Other Ambulatory Visit: Payer: Self-pay

## 2024-04-03 DIAGNOSIS — R059 Cough, unspecified: Secondary | ICD-10-CM | POA: Insufficient documentation

## 2024-04-03 DIAGNOSIS — R1310 Dysphagia, unspecified: Secondary | ICD-10-CM | POA: Diagnosis not present

## 2024-04-03 DIAGNOSIS — R053 Chronic cough: Secondary | ICD-10-CM | POA: Insufficient documentation

## 2024-04-03 DIAGNOSIS — J383 Other diseases of vocal cords: Secondary | ICD-10-CM | POA: Insufficient documentation

## 2024-04-03 DIAGNOSIS — R498 Other voice and resonance disorders: Secondary | ICD-10-CM | POA: Insufficient documentation

## 2024-04-03 NOTE — Patient Instructions (Addendum)
 SLP provided information from [facility] on chronic cough  SLP provided information on strategies for cough suppression/laryngeal control

## 2024-04-03 NOTE — Therapy (Signed)
 OUTPATIENT SPEECH LANGUAGE PATHOLOGY VOICE EVALUATION   Patient Name: Terry Shannon MRN: [id number] DOB:Jan 22, 1956, 68 y.o., female Today's Date: 04/03/2024  PCP: Theo Iha, MD REFERRING PROVIDER: Mila Lao, DO  END OF SESSION:  End of Session - 04/03/24 1732     Visit Number 1    Number of Visits 17    Date for Recertification  06/02/24    SLP Start Time 1535    SLP Stop Time  1617    SLP Time Calculation (min) 42 min    Activity Tolerance Patient tolerated treatment well          Past Medical History:  Diagnosis Date   Allergy     year-round, pt. 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   right   Complication of anesthesia    slow to wake up after gallbladder surgery   Depression    Eczema    ARMS AND HANDS   GERD (gastroesophageal reflux disease)    History of thyroid  cancer    Hypothyroidism    Migraines    Osteoarthritis    Overactive bladder    RA (rheumatoid arthritis) (HCC)    Sleep apnea    no CPAP use   Squamous cell carcinoma of skin 12/21/2017   in situ-mid chest ([id number])   Ulcerative colitis (HCC)    Past Surgical History:  Procedure Laterality Date   ABDOMINAL HERNIA REPAIR  03/13/2008   periumbilical ventral hernia/incisional hernia   BUNIONECTOMY Right    x 2 more   BUNIONECTOMY Left    BUNIONECTOMY WITH CHILECTOMY Right 12/16/2005   CARPAL TUNNEL RELEASE Right 07/13/2001   CARPAL TUNNEL RELEASE Left 08/10/2001   DILATION AND CURETTAGE OF UTERUS     HAMMER TOE SURGERY Left may 2014   HYSTEROSCOPY WITH D & C  03/01/2004   with exc. of endometrial polyp   KNEE ARTHROSCOPY Right 2013   LAPAROSCOPIC CHOLECYSTECTOMY  11/03/2006   LUMBAR FUSION  03/06/2014   l4  l5    nerve ablation lumbar      THYROIDECTOMY, PARTIAL Right prior to 2002   THYROIDECTOMY, PARTIAL Left 11/29/2000   and isthmus   TONSILLECTOMY  1961   TOTAL ANKLE REPLACEMENT Left 11/2016   with tendon repair   TOTAL KNEE  ARTHROPLASTY Right 04/01/2013   Procedure: RIGHT TOTAL KNEE ARTHROPLASTY;  Surgeon: [name], MD;  Location: [facility] OR;  Service: Orthopedics;  Laterality: Right;   Patient Active Problem List   Diagnosis Date Noted   Asthmatic bronchitis, mild persistent, uncomplicated 01/27/2022   Upper airway cough syndrome 01/27/2022   OSA on CPAP 04/27/2021   Intolerance of continuous positive airway pressure (CPAP) ventilation 06/17/2020   OSA (obstructive sleep apnea) 05/06/2020   Snoring 05/06/2020   Insomnia secondary to chronic pain 03/24/2020   History of sleep apnea 03/24/2020   Gasping for breath 03/24/2020   Abnormal glucose level 12/16/2019   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   Degeneration of lumbar intervertebral disc 12/16/2019   Hyperlipidemia 12/16/2019   Hypothyroidism 12/16/2019   Insomnia 12/16/2019   Migraine 12/16/2019   Overweight 12/16/2019   Postoperative hypothyroidism 12/16/2019   Vitamin D  deficiency 12/16/2019   Hallux rigidus, right foot 12/05/2019   Hammer toe of right foot 12/05/2019   History of left ankle joint replacement 12/05/2019   Aftercare following ankle joint replacement surgery 01/10/2017   Anxiety, mild 12/12/2016   Atypical chest pain 12/12/2016   Cardiac murmur 12/12/2016  GERD (gastroesophageal reflux disease) 12/12/2016   History of thyroid  cancer 12/12/2016   Post-traumatic osteoarthritis of left ankle 10/03/2016   Chronic migraine without aura, with intractable migraine, so stated, with status migrainosus 09/26/2015   Joint pain 02/18/2015   Abnormal C-reactive protein 01/08/2015   Radiculopathy 03/06/2014   Osteoarthritis of right knee 04/01/2013   OA (osteoarthritis) 11/01/2011   OAB (overactive bladder)    Ulcerative colitis (HCC)    Cancer (HCC)    Carpal tunnel syndrome    RA (rheumatoid arthritis) (HCC)     Onset date: 2-3 years ago  REFERRING DIAG: R05.3 (ICD-10-CM) - Chronic cough     THERAPY DIAG:  Other voice  and resonance disorders  Rationale for Evaluation and Treatment: Rehabilitation  SUBJECTIVE:   SUBJECTIVE STATEMENT: Pt enters with dry hacking cough and non-productive throat clears.  Pt accompanied by: self  PERTINENT HISTORY:  From neurology note 01/17/24: Terry Shannon is a 68 y.o. female with a history of migraine headaches without aura. Returns today for follow-up.  Overall she feels that her migraines have remained relatively stable.  She does state that in the last couple weeks they have increased in frequency but her next infusion of Vyepti  is due September 4.  She uses sumatriptan  hand with good benefit.  States that she rarely has to take a second dose.  She reports in regards to her CPAP there are some nights she falls asleep without putting it on and then wakes up later and puts it on.    PAIN:  Are you having pain? Yes: NPRS scale: 4/10 Pain location: rt throat Pain description: deep pain Aggravating factors: ICK Relieving factors: IDK  FALLS: Has patient fallen in last 6 months? No,   LIVING ENVIRONMENT: Lives with: lives with their partner Lives in: House/apartment  PLOF:Level of assistance: Independent with ADLs, Independent with IADLs Employment: Retired  PATIENT GOALS: get rid of this hoarseness  OBJECTIVE:  Note: Objective measures were completed at Evaluation unless otherwise noted.  DIAGNOSTIC FINDINGS:  ENT - Dr. Mila 04/01/24 Flexible Fiberoptic Laryngoscopy Procedure Note Date of procedure 04/01/2024 Pre-Op Diagnosis: chronic cough                           Post Op Diagnosis: same Procedure: Flexible Fiberoptic Laryngoscopy CPT 31575 - Mod 25 Surgeon: Adah Mila, D.O. Anesthesia: 4% lidocaine  with afrin Findings:  - mucopurulent drainage noted along the posterior pharyngeal wall with some pooling in the vallecula - bilateral vocal cord motion - interval improvement in vocal cord atrophy Procedure Detail: After verbal consent was obtained  from the patient, the patient was brought in an upright position, a fiberoptic nasal laryngoscope was then passed into the patient right nasal passage and left nasal passage, the left appeared to be more patent. It was passed along the floor of the nasal cavity to the nasopharynx. Torus tubarius was patent and the Fossa of Rosenmller was identified. The scope was then flexed caudally and advanced slowly through the nasopharynx, passed through the oropharynx, and down into the hypopharynx. The patient's oro- and nasopharynx were noted to have mucopurulent drainage.   The base of tongue was visualized and no mass, ulceration or lesion was appreciated. The epiglottis did not demonstrate any mass, ulceration or lesion. The vallecula was also assessed with no mass, ulceration or lesion. The patient had good glottic closure upon phonation and no signs of aspiration or pooling of secretions. Interval improvement in vocal cord atrophy  was noted with post-op changes of injection bilaterally. The patient was asked to inspire and expire with the true vocal folds vibrating normally and without evidence of vocal fold dysfunction. The true and false vocal cords, interarytenoid, AE folds, and arytenoids did not demonstrate any significant edema or erythema. The patient was then asked to valsalva, and the pyriform sinuses were assessed which were unremarkable. The airway was patent and there was no evidence of compromise. The scope was then slowly withdrawn from the patient. The patient tolerated the procedure well and there were no complications.  Disposition: Stable   Impression & Plans:   Assessment and Plan Assessment & Plan Chronic cough with mucus production and possible upper respiratory infection - Start doxycycline  100 mg BID x 7 days - Start Voice therapy - scheduled for tomorrow   Allergic rhinitis with nasal congestion Nasal congestion affecting CPAP use. Ipratropium bromide  caused epistaxis, Flonase   caused migraines. - Prescribed Xyzal (levocetirizine) 5 mg at night. - Prescribed Nasonex (mometasone furoate) nasal spray once daily in the morning. - Prescribed azelastine  nasal spray twice daily, in the morning and at night.  ENT - Dr. Mila 03/14/24 Flexible Fiberoptic Laryngoscopy Procedure Note Date of procedure 03/14/2024 Pre-Op Diagnosis: chronic cough                           Post Op Diagnosis: same Procedure: Flexible Fiberoptic Laryngoscopy CPT 31575 - Mod 25 Surgeon: Adah Mila, D.O. Anesthesia: 4% lidocaine  with afrin Findings:  - bilateral vocal cord atrophy - no masses or lesions Procedure Detail: After verbal consent was obtained from the patient, the patient was brought in an upright position, a fiberoptic nasal laryngoscope was then passed into the patient right nasal passage and left nasal passage, the left appeared to be more patent. It was passed along the floor of the nasal cavity to the nasopharynx. Torus tubarius was patent and the Fossa of Rosenmller was identified. The scope was then flexed caudally and advanced slowly through the nasopharynx, passed through the oropharynx, and down into the hypopharynx. The patient's oro- and nasopharynx were unremarkable with no signs of any gross lesions, edema, masses, or bleeding.  The base of tongue was visualized and no mass, ulceration or lesion was appreciated. The epiglottis did not demonstrate any mass, ulceration or lesion. The vallecula was also assessed with no mass, ulceration or lesion. The patient had good glottic closure upon phonation and no signs of aspiration or pooling of secretions. The patient was asked to inspire and expire with the true vocal folds vibrating normally and without evidence of vocal fold dysfunction. Bilateral true vocal cords were noted to be thin and atrophic. The true and false vocal cords, interarytenoid, AE folds, and arytenoids did not demonstrate any significant edema or erythema. The  patient was then asked to valsalva, and the pyriform sinuses were assessed which were unremarkable. The airway was patent and there was no evidence of compromise. The scope was then slowly withdrawn from the patient. The patient tolerated the procedure well and there were no complications.  Disposition: Stable   Impression & Plans: Kendrea Cerritos is a 68 y.o. female with chronic cough and right ear fullness.   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  Discussed risks, benefits, alternatives of the  procedure.   Vasomotor rhinitis - Start Atrovent  twice daily   Eustachian tube dysfunction - Right TM with nonfunctional ear tube noted on exam - Will plan to replace ear tube in OR  MBS 01-11-24 Clinical Impression: Clinical Impression: Patient presents with functional oropharyneal swallow ability. Her swallow is characterized by decreased timing of laryngeal closure/epiglottic closure/deflection resulting in laryngeal penetration of thin liquids. No aspiration noted despite sequential swallows.  Minimal oral retention with liquids spilled into pharynx and were also tracely penetrated.  Pharyngeal swallow is strong without significant retention.  Barium tablet taken with thin easily transited through oropharynx.  Of note, following exam, pt coughed and reported backflow of cracker and liquids into mouth that she subsequently re-swallowed.  No evidence of oropharynegal dysfunction to account for her symptoms. She was noted to clear her throat prior to study and cough and clear her throat throughout and after testing.  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  Thanks for  this consult of this most pleasant pt.  Pt's symptoms were not replicated during today's test.   Factors that may increase risk of adverse event in presence of aspiration Noe & Lianne 2021): No data recorded   Recommendations/Plan: Swallowing Evaluation Recommendations Swallowing Evaluation Recommendations Recommendations: PO diet PO Diet Recommendation: Regular; Thin liquids (Level 0) Liquid Administration via: Cup; Straw Medication Administration: Whole meds with liquid Supervision: Patient able to self-feed Swallowing strategies  : Slow rate; Small bites/sips (start intake with liquids) Postural changes: Position pt fully upright for meals; Stay upright 30-60 min after meals Oral care recommendations: Oral care BID (2x/day)    COGNITION: Overall cognitive status: Within functional limits for tasks assessed  SOCIAL HISTORY: Occupation: Retired Water  intake: unknown - SLP to inquire next session Caffeine/alcohol intake: unknown - SLP to inquire next session Daily voice use: minimal  PERCEPTUAL VOICE ASSESSMENT: Voice quality: breathy, strained, and aphonic Vocal abuse: habitual throat clearing and habitual cough Resonance: normal Respiratory function: thoracic breathing and diaphragmatic/abdominal breathing  OBJECTIVE VOICE ASSESSMENT: Maximum phonation time for sustained ah: 11.3 seconds (below WNL) Conversational pitch average: 195 Hz Conversational pitch range: 93-296 Hz Conversational loudness average: 70 dB Conversational loudness range: 56-81 dB S/z ratio: 0.86 (Suggestive of dysfunction >1.0)  PATIENT REPORTED OUTCOME MEASURES (PROM): [place] Laryngeal Hypersensitivity Questionnaire: score of 14.1 where normal is >17.1. and lower scores indicate greater affect of cough interfering with pt's QoL. ,and Leiester Cough Questionnaire 66/133 with lower scores indicating a greater affect of cough interfering with pt's QoL.                                                                                                                              TREATMENT DATE:   04/03/24: SLP and pt talked about laryngeal control strategies and cough suppression strategies. Today these appeared to assist pt as she did not cough or throat clear >  once during the 1-2 mintues while practicing.   PATIENT EDUCATION: Education details: see treatment date Person educated: Patient Education method: Explanation, Demonstration, Verbal cues, and Handouts Education comprehension: verbalized understanding, returned demonstration, verbal cues required, and needs further education  HOME EXERCISE PROGRAM: Cough suppression and laryngeal control strategies, relaxation strategies, AB  GOALS: Goals reviewed with patient? Yes  SHORT TERM GOALS: Target date: 04/30/24  Pt will demo AB at rest for 2 minutes without cough or throat clear Baseline: Goal status: INITIAL  2.  Pt will report use of a strategy to successfully suppress cough within 30 seconds between two sessions Baseline:  Goal status: INITIAL  3.  Pt will demo 4 strategies to suppress cough/control larynx in 3 sessions Baseline:  Goal status: INITIAL   LONG TERM GOALS: Target date: 06/02/24  Pt will demo AB with sentence responses 90% in 4 minutes without cough or throat clear  Baseline:  Goal status: INITIAL  2.  Pt will demo AB with 4 minutes conversation without cough or throat clear Baseline:  Goal status: INITIAL  3.  Pt will report use of a strategy to successfully suppress cough within 30 seconds in or between three sessions, after 05/02/24 Baseline:  Goal status: INITIAL  4.  Pt will improve at least one PROM Baseline:  Goal status: INITIAL   ASSESSMENT:  CLINICAL IMPRESSION: Patient is a 68 y.o. F who was seen today for voice evaluation due to chronic cough. 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 Today pt cleared throat 44 times and coughed 7 times in this 40-minute evaluation. When asked if her cough interferes with her ability to communicate, she answered, It is right now.  OBJECTIVE IMPAIRMENTS: include voice disorder. These impairments are limiting patient from effectively communicating at home and in community. Factors affecting potential to achieve goals and functional outcome are severity of impairments.. Patient will benefit from skilled SLP services to address above impairments and improve overall function.  REHAB POTENTIAL: Good  PLAN:  SLP FREQUENCY: 1-2x/week  SLP DURATION: 8 weeks  PLANNED INTERVENTIONS: Internal/external aids, Functional tasks, SLP instruction and feedback, Compensatory strategies, Patient/family education, [phone] Treatment of speech (30 or 45 min) , and voice exercises, laryngeal control, and cough suppression strategies    Aleah Ahlgrim, CCC-SLP 04/03/2024, 10:36 PM  Referring diagnosis:   R05.3 (ICD-10-CM) - Chronic cough     Treatment diagnosis (if different than referring diagnosis): other voice and resonance disorders ICD-10-CM: R49.8   Date Symptoms Began: 05/23/2021 # of Visits requested: 17  Time period for Authorization: 04/03/24 to 06/02/24  What was this (referring dx) caused by? []  Surgery []  Fall []  Ongoing issue []  Arthritis [x]  Other: ____voice disorder________  Laterality: []  Rt []  Lt [x]  Both  Functional Tool & Score: [place]   Check all possible CPT codes:     See Planned Interventions listed in the Plan section of the Evaluation.     If [employer]: Choose 10 or less codes  If [employer]: Modalities are not covered  If [employer]: Check allowed ICD code combinations   If [facility] Plan or [employer]: Cognitive training not covered

## 2024-04-10 ENCOUNTER — Ambulatory Visit

## 2024-04-10 ENCOUNTER — Ambulatory Visit (INDEPENDENT_AMBULATORY_CARE_PROVIDER_SITE_OTHER)

## 2024-04-10 VITALS — BP 164/92 | HR 88 | Temp 98.0°F | Wt 162.0 lb

## 2024-04-10 DIAGNOSIS — B3789 Other sites of candidiasis: Secondary | ICD-10-CM | POA: Diagnosis not present

## 2024-04-10 DIAGNOSIS — J383 Other diseases of vocal cords: Secondary | ICD-10-CM | POA: Diagnosis not present

## 2024-04-10 DIAGNOSIS — R053 Chronic cough: Secondary | ICD-10-CM

## 2024-04-10 DIAGNOSIS — R1310 Dysphagia, unspecified: Secondary | ICD-10-CM | POA: Diagnosis not present

## 2024-04-10 DIAGNOSIS — R059 Cough, unspecified: Secondary | ICD-10-CM | POA: Diagnosis not present

## 2024-04-10 DIAGNOSIS — J309 Allergic rhinitis, unspecified: Secondary | ICD-10-CM

## 2024-04-10 DIAGNOSIS — R498 Other voice and resonance disorders: Secondary | ICD-10-CM | POA: Diagnosis not present

## 2024-04-10 MED ORDER — FLUCONAZOLE 100 MG PO TABS: ORAL_TABLET | ORAL | 0 refills | Status: AC

## 2024-04-10 NOTE — Progress Notes (Signed)
 HPI:  Discussed the use of AI scribe software for clinical note transcription with the patient, who gave verbal consent to proceed.  History of Present Illness Terry Shannon is a 68 year old female with chronic cough and throat clearing who presents with persistent symptoms.  She has a persistent cough and frequent throat clearing, accompanied by mucus production that varies in color from clear to brown. There has been no improvement in the cough over time. She is currently on gabapentin  for neuropathy, taking 300 mg four times a day, and uses reflux medication daily.  She states her drainage has improved since using the nasal sprays however she continues to have a hoarse voice. She states using the flonase  caused her to have migraines but she was unsure if she was using it correctly. She states she is scheduled to see speech therapy today.     PMH/Meds/All/SocHx/FamHx/ROS: Past Medical History:  Diagnosis Date   Allergy     year-round, pt. 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   right   Complication of anesthesia    slow to wake up after gallbladder surgery   Depression    Eczema    ARMS AND HANDS   GERD (gastroesophageal reflux disease)    History of thyroid  cancer    Hypothyroidism    Migraines    Osteoarthritis    Overactive bladder    RA (rheumatoid arthritis) (HCC)    Sleep apnea    no CPAP use   Squamous cell carcinoma of skin 12/21/2017   in situ-mid chest (CX35FU)   Ulcerative colitis [facility])    Past Surgical History:  Procedure Laterality Date   ABDOMINAL HERNIA REPAIR  03/13/2008   periumbilical ventral hernia/incisional hernia   BUNIONECTOMY Right    x 2 more   BUNIONECTOMY Left    BUNIONECTOMY WITH CHILECTOMY Right 12/16/2005   CARPAL TUNNEL RELEASE Right 07/13/2001   CARPAL TUNNEL RELEASE Left 08/10/2001   DILATION AND CURETTAGE OF UTERUS     HAMMER TOE SURGERY Left may 2014   HYSTEROSCOPY WITH D & C  03/01/2004    with exc. of endometrial polyp   KNEE ARTHROSCOPY Right 2013   LAPAROSCOPIC CHOLECYSTECTOMY  11/03/2006   LUMBAR FUSION  03/06/2014   l4  l5    nerve ablation lumbar      THYROIDECTOMY, PARTIAL Right prior to 2002   THYROIDECTOMY, PARTIAL Left 11/29/2000   and isthmus   TONSILLECTOMY  1961   TOTAL ANKLE REPLACEMENT Left 11/2016   with tendon repair   TOTAL KNEE ARTHROPLASTY Right 04/01/2013   Procedure: RIGHT TOTAL KNEE ARTHROPLASTY;  Surgeon: [name], MD;  Location: [facility] OR;  Service: Orthopedics;  Laterality: Right;   No family history of bleeding disorders, wound healing problems or difficulty with anesthesia.  Social Connections: Not on file    Current Outpatient Medications:    ALPRAZolam  (XANAX ) 0.5 MG tablet, Take only before procedures (injections into the neck and low back) up to 3x a day (Patient not taking: Reported on 04/01/2024), Disp: 30 tablet, Rfl: 0   Ascorbic Acid (VITAMIN C PO), Take 6,000 mg by mouth daily., Disp: , Rfl:    aspirin  EC 325 MG tablet, Take 325 mg by mouth 2 (two) times daily as needed for mild pain.  (Patient not taking: Reported on 04/01/2024), Disp: , Rfl:    azelastine  (ASTELIN ) 0.1 % nasal spray, Place 2 sprays into both nostrils 2 (two) times daily. Use in  each nostril as directed, Disp: 30 mL, Rfl: 12   B Complex-C (SUPER B COMPLEX PO), Take 1 tablet by mouth daily., Disp: , Rfl:    cephALEXin  (KEFLEX ) 500 MG capsule, Take 500 mg by mouth every 6 (six) hours., Disp: , Rfl:    Cholecalciferol  (VITAMIN D3 PO), Take 1 tablet by mouth daily. , Disp: , Rfl:    cyclobenzaprine  (FLEXERIL ) 10 MG tablet, Take 10 mg by mouth 3 (three) times daily as needed for muscle spasms. (Patient taking differently: Take 10 mg by mouth as needed for muscle spasms.), Disp: , Rfl:    desvenlafaxine (PRISTIQ) 100 MG 24 hr tablet, Take 100 mg by mouth daily. , Disp: , Rfl:    famotidine  (PEPCID ) 40 MG tablet, Take 40 mg by mouth 2 (two) times daily., Disp: , Rfl:    Flax  OIL, Take by mouth daily., Disp: , Rfl:    gabapentin  (NEURONTIN ) 300 MG capsule, Take 300 mg by mouth 4 (four) times daily. , Disp: , Rfl:    golimumab  (SIMPONI  ARIA) 50 MG/4ML SOLN injection, See admin instructions., Disp: , Rfl:    ipratropium (ATROVENT ) 0.02 % nebulizer solution, Take 2.5 mLs (0.5 mg total) by nebulization 4 (four) times daily., Disp: 75 mL, Rfl: 12   levocetirizine (XYZAL) 5 MG tablet, Take 1 tablet (5 mg total) by mouth every evening., Disp: 30 tablet, Rfl: 2   levothyroxine (SYNTHROID) 50 MCG tablet, Take 50 mcg by mouth daily before breakfast., Disp: , Rfl:    losartan (COZAAR) 50 MG tablet, Take 1 tablet by mouth every day for blood pressure, Disp: , Rfl:    mesalamine  (LIALDA ) 1.2 G EC tablet, Take 2.4 g by mouth 2 (two) times daily. , Disp: , Rfl:    MIEBO 1.338 GM/ML SOLN, , Disp: , Rfl:    naphazoline-pheniramine (NAPHCON-A) 0.025-0.3 % ophthalmic solution, Place 1 drop into both eyes 4 (four) times daily as needed for eye irritation., Disp: 15 mL, Rfl: 0   oxybutynin  (DITROPAN -XL) 10 MG 24 hr tablet, Take 10 mg by mouth daily., Disp: , Rfl:    pantoprazole  (PROTONIX ) 40 MG tablet, Oral; Duration: 90 Days, Disp: , Rfl:    Probiotic Product (PROBIOTIC DAILY PO), Take 1 tablet by mouth daily. , Disp: , Rfl:    propranolol  (INDERAL ) 10 MG tablet, TAKE 1 TAB 30-60 MINUTES PRIOR TO EXERCISE FOR EXERCISE-INDUCED HEADACHES. MAX TWICE DAILY, Disp: 90 tablet, Rfl: 0   propranolol  (INDERAL ) 20 MG tablet, Take 20 mg by mouth 2 (two) times daily., Disp: , Rfl:    RESTASIS MULTIDOSE 0.05 % ophthalmic emulsion, Place 1 drop into both eyes 2 (two) times daily., Disp: , Rfl:    Rosuvastatin Calcium (CRESTOR PO), Take 20 mg by mouth daily. , Disp: , Rfl:    sucralfate (CARAFATE) 1 g tablet, Take 1 g by mouth 4 (four) times daily. (Patient not taking: Reported on 04/01/2024), Disp: , Rfl:    SUMAtriptan  (IMITREX ) 100 MG tablet, TAKE 1 TABLET BY MOUTH ONCE DAILY AS NEEDED. MAY REPEAT IN  2 HOURS IF HEADACHE PERSISTS OR RECURS. (Patient taking differently: as needed. TAKE 1 TABLET BY MOUTH ONCE DAILY AS NEEDED. MAY REPEAT IN 2 HOURS IF HEADACHE PERSISTS OR RECURS.), Disp: 10 tablet, Rfl: 4   thyroid  (ARMOUR) 60 MG tablet, Take 60 mg by mouth daily before breakfast., Disp: , Rfl:    triamcinolone (NASACORT) 55 MCG/ACT AERO nasal inhaler, Place 2 sprays into the nose daily., Disp: 1 each, Rfl: 12  zolpidem  (AMBIEN  CR) 12.5 MG CR tablet, TAKE ONE TABLET BY MOUTH DAILY AT BEDTIME, Disp: 30 tablet, Rfl: 0   zonisamide  (ZONEGRAN ) 100 MG capsule, TAKE 1 CAPSULE (100 MG TOTAL) BY MOUTH DAILY. FOR MIGRAINE PREVENTION., Disp: 90 capsule, Rfl: 1 A complete ROS was performed with pertinent positives/negatives noted in the HPI. The remainder of the ROS are negative.   Physical Exam:  There were no vitals taken for this visit. General: Well developed, well nourished. No acute distress. Voice hoarse Head/Face: Normocephalic. No sinus tenderness. Facial nerve intact and equal bilaterally. No facial lacerations. Eyes: PERRL, no scleral icterus or conjunctival hemorrhage. EOMI. Ears: No gross deformity. Normal external canal. Tympanic membrane left in tact and right with tube in place Hearing: Normal speech reception.  Nose: No gross deformity or lesions. No purulent discharge. No turbinate hypertrophy. Mouth/Oropharynx: Lips without any lesions. Dentition good. No mucosal lesions within the oropharynx. No tonsillar enlargement, exudate, or lesions. Pharyngeal walls symmetrical. Uvula midline. Tongue midline without lesions. Larynx: See TFL if applicable Nasopharynx: See TFL if applicable Neck: Trachea midline. No masses. No thyromegaly or nodules palpated. No crepitus. Lymphatic: No lymphadenopathy in the neck. Respiratory: No stridor or distress. Room air. Cardiovascular: Regular rate and rhythm. Extremities: No edema or cyanosis. Warm and well-perfused. Skin: No scars or lesions on face or  neck. Neurologic: CN II-XII grossly intact. Moving all extremities without gross abnormality. Other:  Independent Review of Additional Tests or Records: None Procedures:  Flexible Fiberoptic Laryngoscopy Procedure Note  Date of procedure 04/10/2024 Pre-Op Diagnosis: chronic cough    Post Op Diagnosis: same Procedure: Flexible Fiberoptic Laryngoscopy CPT 31575 - Mod 25 Surgeon: Adah Malkin, D.O. Anesthesia: 4% lidocaine  with afrin Findings:  - mucoid drainage noted along the posterior pharyngeal wall with some dried blood noted - bilateral vocal cord motion - white plaquing of vocal cords with thick mucus Procedure Detail: After verbal consent was obtained from the patient, the patient was brought in an upright position, a fiberoptic nasal laryngoscope was then passed into the patient right nasal passage and left nasal passage, the left appeared to be more patent. It was passed along the floor of the nasal cavity to the nasopharynx. Torus tubarius was patent and the Fossa of Rosenmller was identified. The scope was then flexed caudally and advanced slowly through the nasopharynx, passed through the oropharynx, and down into the hypopharynx. The patient's oro- and nasopharynx were noted to have mucoid drainage.   The base of tongue was visualized and no mass, ulceration or lesion was appreciated. The epiglottis did not demonstrate any mass, ulceration or lesion. The vallecula was also assessed with no mass, ulceration or lesion. The patient had good glottic closure upon phonation and no signs of aspiration or pooling of secretions. 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 The patient was asked to inspire and expire with the true vocal folds vibrating normally and without evidence of vocal fold dysfunction. The true and false vocal cords, interarytenoid, AE folds, and arytenoids did not demonstrate any significant edema or erythema. The patient was then  asked to valsalva, and the pyriform sinuses were assessed which were unremarkable. The airway was patent and there was no evidence of compromise. The scope was then slowly withdrawn from the patient. The patient tolerated the procedure well and there were no complications.  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 Ipratropium bromide  caused epistaxis, Flonase  caused migraines. - Prescribed Xyzal  (levocetirizine) 5 mg at night. - Prescribed Nasonex  (mometasone  furoate) nasal spray once daily in the morning. - Prescribed azelastine  nasal spray twice daily, in the morning and at night.    Follow-up in 2 months for repeat scope.   Adah Malkin, DO [facility]

## 2024-04-10 NOTE — Patient Instructions (Signed)
   Practice abdominal breathing 10-15 minutes twice a day  TRY TO HAVE QUIET BREATHING - relax the throat by using visualizations  -throat getting bigger and bigger -feeling of tightness evaporating or melting -visualization of relax (massage in low lighting with some oriental music)

## 2024-04-10 NOTE — Therapy (Signed)
 OUTPATIENT SPEECH LANGUAGE PATHOLOGY VOICE TREATMENT   Patient Name: Terry Shannon MRN: [id number] DOB:Sep 22, 1955, 68 y.o., female Today's Date: 04/10/2024  PCP: Theo Iha, MD REFERRING PROVIDER: Mila Lao, DO  END OF SESSION:  End of Session - 04/10/24 1541     Visit Number 2    Number of Visits 17    Date for Recertification  06/02/24    SLP Start Time 1535    SLP Stop Time  1615    SLP Time Calculation (min) 40 min    Activity Tolerance Patient tolerated treatment well           Past Medical History:  Diagnosis Date   Allergy     year-round, pt. 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   right   Complication of anesthesia    slow to wake up after gallbladder surgery   Depression    Eczema    ARMS AND HANDS   GERD (gastroesophageal reflux disease)    History of thyroid  cancer    Hypothyroidism    Migraines    Osteoarthritis    Overactive bladder    RA (rheumatoid arthritis) (HCC)    Sleep apnea    no CPAP use   Squamous cell carcinoma of skin 12/21/2017   in situ-mid chest ([id number])   Ulcerative colitis (HCC)    Past Surgical History:  Procedure Laterality Date   ABDOMINAL HERNIA REPAIR  03/13/2008   periumbilical ventral hernia/incisional hernia   BUNIONECTOMY Right    x 2 more   BUNIONECTOMY Left    BUNIONECTOMY WITH CHILECTOMY Right 12/16/2005   CARPAL TUNNEL RELEASE Right 07/13/2001   CARPAL TUNNEL RELEASE Left 08/10/2001   DILATION AND CURETTAGE OF UTERUS     HAMMER TOE SURGERY Left may 2014   HYSTEROSCOPY WITH D & C  03/01/2004   with exc. of endometrial polyp   KNEE ARTHROSCOPY Right 2013   LAPAROSCOPIC CHOLECYSTECTOMY  11/03/2006   LUMBAR FUSION  03/06/2014   l4  l5    nerve ablation lumbar      THYROIDECTOMY, PARTIAL Right prior to 2002   THYROIDECTOMY, PARTIAL Left 11/29/2000   and isthmus   TONSILLECTOMY  1961   TOTAL ANKLE REPLACEMENT Left 11/2016   with tendon repair   TOTAL KNEE  ARTHROPLASTY Right 04/01/2013   Procedure: RIGHT TOTAL KNEE ARTHROPLASTY;  Surgeon: [name], MD;  Location: [facility] OR;  Service: Orthopedics;  Laterality: Right;   Patient Active Problem List   Diagnosis Date Noted   Asthmatic bronchitis, mild persistent, uncomplicated 01/27/2022   Upper airway cough syndrome 01/27/2022   OSA on CPAP 04/27/2021   Intolerance of continuous positive airway pressure (CPAP) ventilation 06/17/2020   OSA (obstructive sleep apnea) 05/06/2020   Snoring 05/06/2020   Insomnia secondary to chronic pain 03/24/2020   History of sleep apnea 03/24/2020   Gasping for breath 03/24/2020   Abnormal glucose level 12/16/2019   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   Degeneration of lumbar intervertebral disc 12/16/2019   Hyperlipidemia 12/16/2019   Hypothyroidism 12/16/2019   Insomnia 12/16/2019   Migraine 12/16/2019   Overweight 12/16/2019   Postoperative hypothyroidism 12/16/2019   Vitamin D  deficiency 12/16/2019   Hallux rigidus, right foot 12/05/2019   Hammer toe of right foot 12/05/2019   History of left ankle joint replacement 12/05/2019   Aftercare following ankle joint replacement surgery 01/10/2017   Anxiety, mild 12/12/2016   Atypical chest pain 12/12/2016   Cardiac murmur 12/12/2016  GERD (gastroesophageal reflux disease) 12/12/2016   History of thyroid  cancer 12/12/2016   Post-traumatic osteoarthritis of left ankle 10/03/2016   Chronic migraine without aura, with intractable migraine, so stated, with status migrainosus 09/26/2015   Joint pain 02/18/2015   Abnormal C-reactive protein 01/08/2015   Radiculopathy 03/06/2014   Osteoarthritis of right knee 04/01/2013   OA (osteoarthritis) 11/01/2011   OAB (overactive bladder)    Ulcerative colitis (HCC)    Cancer (HCC)    Carpal tunnel syndrome    RA (rheumatoid arthritis) (HCC)     Onset date: 2-3 years ago  REFERRING DIAG: R05.3 (ICD-10-CM) - Chronic cough     THERAPY DIAG:  Other voice  and resonance disorders  Rationale for Evaluation and Treatment: Rehabilitation  SUBJECTIVE:   SUBJECTIVE STATEMENT: Pt enters again with dry hacking cough and non-productive throat clears.  Pt accompanied by: self  PERTINENT HISTORY:  From neurology note 01/17/24: Terry Shannon is a 68 y.o. female with a history of migraine headaches without aura. Returns today for follow-up.  Overall she feels that her migraines have remained relatively stable.  She does state that in the last couple weeks they have increased in frequency but her next infusion of Vyepti  is due September 4.  She uses sumatriptan  hand with good benefit.  States that she rarely has to take a second dose.  She reports in regards to her CPAP there are some nights she falls asleep without putting it on and then wakes up later and puts it on.    PAIN:  Are you having pain? Yes: NPRS scale: 3/10 Pain location: fingers Pain description: ache Aggravating factors: activity/movement Relieving factors: rest  FALLS: Has patient fallen in last 6 months? No,    PATIENT GOALS: get rid of this hoarseness  OBJECTIVE:  Note: Objective measures were completed at Evaluation unless otherwise noted.  DIAGNOSTIC FINDINGS:  ENT- Dharap 04/10/24: Flexible Fiberoptic Laryngoscopy Procedure Note Date of procedure 04/10/2024 Pre-Op Diagnosis: chronic cough                           Post Op Diagnosis: same Procedure: Flexible Fiberoptic Laryngoscopy CPT 31575 - Mod 25 Surgeon: Adah Malkin, D.O. Anesthesia: 4% lidocaine  with afrin Findings:  - mucopurulent drainage noted along the posterior pharyngeal wall with some pooling in the vallecula - bilateral vocal cord motion - interval improvement in vocal cord atrophy Procedure Detail: After verbal consent was obtained from the patient, the patient was brought in an upright position, a fiberoptic nasal laryngoscope was then passed into the patient right nasal passage and left nasal  passage, the left appeared to be more patent. It was passed along the floor of the nasal cavity to the nasopharynx. Torus tubarius was patent and the Fossa of Rosenmller was identified. The scope was then flexed caudally and advanced slowly through the nasopharynx, passed through the oropharynx, and down into the hypopharynx. The patient's oro- and nasopharynx were noted to have mucopurulent drainage.   The base of tongue was visualized and no mass, ulceration or lesion was appreciated. The epiglottis did not demonstrate any mass, ulceration or lesion. The vallecula was also assessed with no mass, ulceration or lesion. The patient had good glottic closure upon phonation and no signs of aspiration or pooling of secretions. 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 The patient was asked to inspire and expire with the true vocal folds vibrating normally and without evidence  of vocal fold dysfunction. The true and false vocal cords, interarytenoid, AE folds, and arytenoids did not demonstrate any significant edema or erythema. The patient was then asked to valsalva, and the pyriform sinuses were assessed which were unremarkable. The airway was patent and there was no evidence of compromise. The scope was then slowly withdrawn from the patient. The patient tolerated the procedure well and there were no complications.  Disposition: Stable   Impression & Plans:   Assessment and Plan Assessment & Plan Chronic cough with mucus production and possible upper respiratory infection - Start doxycycline  100 mg BID x 7 days - Start Voice therapy - scheduled for tomorrow   Allergic rhinitis with nasal congestion Nasal congestion affecting CPAP use. Ipratropium bromide  caused epistaxis, Flonase  caused migraines. - Prescribed Xyzal (levocetirizine) 5 mg at night. - Prescribed Nasonex (mometasone furoate) nasal spray once daily in the morning. - Prescribed  azelastine  nasal spray twice daily, in the morning and at night.   ENT - Dr. Mila 04/01/24 Flexible Fiberoptic Laryngoscopy Procedure Note Date of procedure 04/01/2024 Pre-Op Diagnosis: chronic cough                           Post Op Diagnosis: same Procedure: Flexible Fiberoptic Laryngoscopy CPT 31575 - Mod 25 Surgeon: Adah Mila, D.O. 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 It was passed along the floor of the nasal cavity to the nasopharynx. Torus tubarius was patent and the Fossa of Rosenmller was identified. The scope was then flexed caudally and advanced slowly through the nasopharynx, passed through the oropharynx, and down into the hypopharynx. The patient's oro- and nasopharynx were noted to have mucopurulent drainage.   The base of tongue was visualized and no mass, ulceration or lesion was appreciated. The epiglottis did not demonstrate any mass, ulceration or lesion. The vallecula was also assessed with no mass, ulceration or lesion. The patient had good glottic closure upon phonation and no signs of aspiration or pooling of secretions. 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 The patient was asked to inspire and expire with the true vocal folds vibrating normally and without evidence of vocal fold dysfunction. The true and false vocal cords, interarytenoid, AE folds, and arytenoids did not demonstrate any significant edema or erythema. The patient was then asked to valsalva, and the  pyriform sinuses were assessed which were unremarkable. The airway was patent and there was no evidence of compromise. The scope was then slowly withdrawn from the patient. The patient tolerated the procedure well and there were no complications.  Disposition: Stable   Impression & Plans:   Assessment and Plan Assessment & Plan Chronic cough with mucus production and possible upper respiratory infection - Start doxycycline  100 mg BID x 7 days - Start Voice therapy - scheduled for tomorrow   Allergic rhinitis with nasal congestion Nasal congestion affecting CPAP use. Ipratropium bromide  caused epistaxis, Flonase  caused migraines. - Prescribed Xyzal (levocetirizine) 5 mg at night. - Prescribed Nasonex (mometasone furoate) nasal spray once daily in the morning. -  Prescribed azelastine  nasal spray twice daily, in the morning and at night.  ENT - Dr. Mila 03/14/24 Flexible Fiberoptic Laryngoscopy Procedure Note Date of procedure 03/14/2024 Pre-Op Diagnosis: chronic cough                           Post Op Diagnosis: same Procedure: Flexible Fiberoptic Laryngoscopy CPT 31575 - Mod 25 Surgeon: Adah Mila, D.O. Anesthesia: 4% lidocaine  with afrin Findings:  - bilateral vocal cord atrophy - no masses or lesions Procedure Detail: After verbal consent was obtained from the patient, the patient was brought in an upright position, a fiberoptic nasal laryngoscope was then passed into the patient right nasal passage and left nasal passage, the left appeared to be more patent. It was passed along the floor of the nasal cavity to the nasopharynx. Torus tubarius was patent and the Fossa of Rosenmller was identified. The scope was then flexed caudally and advanced slowly through the nasopharynx, passed through the oropharynx, and down into the hypopharynx. The patient's oro- and nasopharynx were unremarkable with no signs of any gross lesions, edema, masses, or bleeding.  The base of tongue was  visualized and no mass, ulceration or lesion was appreciated. The epiglottis did not demonstrate any mass, ulceration or lesion. The vallecula was also assessed with no mass, ulceration or lesion. The patient had good glottic closure upon phonation and no signs of aspiration or pooling of secretions. The patient was asked to inspire and expire with the true vocal folds vibrating normally and without evidence of vocal fold dysfunction. Bilateral true vocal cords were noted to be thin and atrophic. The true and false vocal cords, interarytenoid, AE folds, and arytenoids did not demonstrate any significant edema or erythema. The patient was then asked to valsalva, and the pyriform sinuses were assessed which were unremarkable. The airway was patent and there was no evidence of compromise. The scope was then slowly withdrawn from the patient. The patient tolerated the procedure well and there were no complications.  Disposition: Stable   Impression & Plans: Fantasha Daniele is a 68 y.o. female with chronic cough and right ear fullness.   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  Discussed risks, benefits, alternatives of the procedure.   Vasomotor rhinitis - Start Atrovent  twice daily   Eustachian tube dysfunction - Right TM with nonfunctional ear tube noted on exam - Will plan to replace ear tube in OR  MBS 01-11-24 Clinical Impression: Clinical Impression: Patient presents with functional oropharyneal swallow ability. Her swallow is characterized by decreased timing of laryngeal closure/epiglottic closure/deflection resulting in laryngeal penetration of thin liquids. No aspiration noted despite sequential swallows.  Minimal oral retention with liquids spilled into pharynx  and were also tracely penetrated.  Pharyngeal swallow is strong without significant retention.  Barium tablet taken with thin easily transited through oropharynx.  Of note, following exam, pt coughed and reported backflow of cracker and liquids into mouth that she subsequently re-swallowed.  No evidence of oropharynegal dysfunction to account for her symptoms. She was noted to clear her throat prior to study and cough and clear her throat throughout and after testing.  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  Thanks for this consult of this most pleasant pt.  Pt's symptoms were not replicated during today's test.   Factors that may increase risk of adverse event in presence of aspiration Noe & Lianne 2021): No data recorded   Recommendations/Plan: Swallowing Evaluation Recommendations Swallowing Evaluation Recommendations Recommendations: PO diet PO Diet Recommendation: Regular; Thin liquids (Level 0) Liquid Administration via: Cup; Straw Medication Administration: Whole meds with liquid Supervision: Patient able to self-feed Swallowing strategies  : Slow rate; Small bites/sips (start intake with liquids) Postural changes: Position pt fully upright for meals; Stay upright 30-60 min after meals Oral care recommendations: Oral care BID (2x/day)   PATIENT REPORTED OUTCOME MEASURES (PROM): [place] Laryngeal Hypersensitivity Questionnaire: score of 14.1 where normal is >17.1. and lower scores indicate greater affect of cough interfering with pt's QoL. ,and Leiester Cough Questionnaire 66/133 with lower scores indicating a greater affect of cough interfering with pt's QoL.                                                                                                                             TREATMENT DATE:  Abdominal Breathing = AB  04/10/24: Pt saw Dr. Mila at 1330 today, as she  was feeling like she could not clear mucous out of throat and when she did she had difficulty breathing. Dr. Mila, per her note, noted mucopurulent drainage. Pt has been practicing cough suppression strategies since the eval. In the first 5 minutes she had 8 throat clears and 2 coughs. SLP introduced abdominal breathing with pt and she was successful quickly, at rest. SLP also introduced pt to visualizations for relaxation during this time. In the last 10 minutes In these 15 minutes pt had 7 throat clears and 2 coughs. SLP reviewed pt's cough suppression strategies with her during the next 10 minutes and pt had 7 throat clears and 2 coughs. SLP pointed out that when pt not focused on AB or relaxation strategies for 5 minutes she had identical frequency to 15 minutes of focus on AB and relaxation strategies. Pt's breathing was whisper-sounding for the first 20 minutes of the session until SLP focused on cough suppression strategies with pt and then it was silent until session end when it became more noisy again.  Pt to practice AB 10-15 minutes BID, and work on quiet breathing with visulaizations.  04/03/24: SLP and pt talked about laryngeal control strategies and cough suppression strategies. Today these appeared to assist pt as she did not cough or throat clear > once during the 1-2 mintues while practicing.   PATIENT EDUCATION: Education details: see treatment date Person educated: Patient Education method: Explanation, Demonstration, Verbal cues, and Handouts Education comprehension: verbalized understanding, returned demonstration, verbal cues required, and needs further education  HOME EXERCISE PROGRAM: Cough suppression and laryngeal control strategies, relaxation strategies, AB  GOALS: Goals reviewed with patient? Yes  SHORT TERM GOALS: Target date: 04/30/24  Pt will demo AB at rest for 2 minutes without cough  or throat clear Baseline: Goal status: INITIAL  2.  Pt will report use of  a strategy to successfully suppress cough within 30 seconds between two sessions Baseline:  Goal status: INITIAL  3.  Pt will demo 4 strategies to suppress cough/control larynx in 3 sessions Baseline:  Goal status: INITIAL   LONG TERM GOALS: Target date: 06/02/24  Pt will demo AB with sentence responses 90% in 4 minutes without cough or throat clear  Baseline:  Goal status: INITIAL  2.  Pt will demo AB with 4 minutes conversation without cough or throat clear Baseline:  Goal status: INITIAL  3.  Pt will report use of a strategy to successfully suppress cough within 30 seconds in or between three sessions, after 05/02/24 Baseline:  Goal status: INITIAL  4.  Pt will improve at least one PROM Baseline:  Goal status: INITIAL   ASSESSMENT:  CLINICAL IMPRESSION: Patient is a 68 y.o. F who was seen today for voice therapy due to chronic cough. 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 See treatment date above for today's date for further details on today's session. Today pt cleared throat 22 times and coughed 6 times. On day of eval pt cleared throat 44 times and coughed 7 times. When she was asked then if her cough interfered with her ability to communicate, she answered, It is right now.  OBJECTIVE IMPAIRMENTS: include voice disorder. These impairments are limiting patient from effectively communicating at home and in community. Factors affecting potential to achieve goals and functional outcome are severity of impairments.. Patient will benefit from skilled SLP services to address above impairments and improve overall function.  REHAB POTENTIAL: Good  PLAN:  SLP FREQUENCY: 1-2x/week  SLP DURATION: 8 weeks  PLANNED INTERVENTIONS: Internal/external aids, Functional tasks, SLP instruction and feedback, Compensatory strategies, Patient/family education,  [phone] Treatment of speech (30 or 45 min) , and voice exercises, laryngeal control, and cough suppression strategies    Tywanda Rice, CCC-SLP 04/10/2024, 3:41 PM  Referring diagnosis:   R05.3 (ICD-10-CM) - Chronic cough     Treatment diagnosis (if different than referring diagnosis): other voice and resonance disorders ICD-10-CM: R49.8   Date Symptoms Began: 05/23/2021 # of Visits requested: 17  Time period for Authorization: 04/03/24 to 06/02/24  What was this (referring dx) caused by? []  Surgery []  Fall []  Ongoing issue []  Arthritis [x]  Other: ____voice disorder________  Laterality: []  Rt []  Lt [x]  Both  Functional Tool & Score: [place]   Check all possible CPT codes:     See Planned Interventions listed in the Plan section of the Evaluation.     If [employer]: Choose 10 or less codes  If [employer]: Modalities are not covered  If [employer]: Check allowed ICD code combinations   If [facility] Plan or [employer]: Cognitive training not covered

## 2024-04-12 ENCOUNTER — Ambulatory Visit

## 2024-04-14 ENCOUNTER — Other Ambulatory Visit: Payer: Self-pay | Admitting: Adult Health

## 2024-04-15 ENCOUNTER — Ambulatory Visit

## 2024-04-15 DIAGNOSIS — R498 Other voice and resonance disorders: Secondary | ICD-10-CM | POA: Diagnosis not present

## 2024-04-15 DIAGNOSIS — J383 Other diseases of vocal cords: Secondary | ICD-10-CM | POA: Diagnosis not present

## 2024-04-15 DIAGNOSIS — R053 Chronic cough: Secondary | ICD-10-CM | POA: Diagnosis not present

## 2024-04-15 DIAGNOSIS — R059 Cough, unspecified: Secondary | ICD-10-CM | POA: Diagnosis not present

## 2024-04-15 DIAGNOSIS — R1310 Dysphagia, unspecified: Secondary | ICD-10-CM | POA: Diagnosis not present

## 2024-04-15 NOTE — Therapy (Signed)
 OUTPATIENT SPEECH LANGUAGE PATHOLOGY VOICE TREATMENT   Patient Name: Terry Shannon MRN: [id number] DOB:11/17/1955, 68 y.o., female Today's Date: 04/15/2024  PCP: Terry Iha, MD REFERRING PROVIDER: Mila Lao, DO  END OF SESSION:  End of Session - 04/15/24 1454     Visit Number 3    Number of Visits 17    Date for Recertification  06/02/24    SLP Start Time 1451    SLP Stop Time  1530    SLP Time Calculation (min) 39 min    Activity Tolerance Patient tolerated treatment well           Past Medical History:  Diagnosis Date   Allergy     year-round, pt. 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   right   Complication of anesthesia    slow to wake up after gallbladder surgery   Depression    Eczema    ARMS AND HANDS   GERD (gastroesophageal reflux disease)    History of thyroid  cancer    Hypothyroidism    Migraines    Osteoarthritis    Overactive bladder    RA (rheumatoid arthritis) (HCC)    Sleep apnea    no CPAP use   Squamous cell carcinoma of skin 12/21/2017   in situ-mid chest ([id number])   Ulcerative colitis (HCC)    Past Surgical History:  Procedure Laterality Date   ABDOMINAL HERNIA REPAIR  03/13/2008   periumbilical ventral hernia/incisional hernia   BUNIONECTOMY Right    x 2 more   BUNIONECTOMY Left    BUNIONECTOMY WITH CHILECTOMY Right 12/16/2005   CARPAL TUNNEL RELEASE Right 07/13/2001   CARPAL TUNNEL RELEASE Left 08/10/2001   DILATION AND CURETTAGE OF UTERUS     HAMMER TOE SURGERY Left may 2014   HYSTEROSCOPY WITH D & C  03/01/2004   with exc. of endometrial polyp   KNEE ARTHROSCOPY Right 2013   LAPAROSCOPIC CHOLECYSTECTOMY  11/03/2006   LUMBAR FUSION  03/06/2014   l4  l5    nerve ablation lumbar      THYROIDECTOMY, PARTIAL Right prior to 2002   THYROIDECTOMY, PARTIAL Left 11/29/2000   and isthmus   TONSILLECTOMY  1961   TOTAL ANKLE REPLACEMENT Left 11/2016   with tendon repair   TOTAL KNEE  ARTHROPLASTY Right 04/01/2013   Procedure: RIGHT TOTAL KNEE ARTHROPLASTY;  Surgeon: Terry LITTIE Gavel, MD;  Location: [facility] OR;  Service: Orthopedics;  Laterality: Right;   Patient Active Problem List   Diagnosis Date Noted   Asthmatic bronchitis, mild persistent, uncomplicated 01/27/2022   Upper airway cough syndrome 01/27/2022   OSA on CPAP 04/27/2021   Intolerance of continuous positive airway pressure (CPAP) ventilation 06/17/2020   OSA (obstructive sleep apnea) 05/06/2020   Snoring 05/06/2020   Insomnia secondary to chronic pain 03/24/2020   History of sleep apnea 03/24/2020   Gasping for breath 03/24/2020   Abnormal glucose level 12/16/2019   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   Degeneration of lumbar intervertebral disc 12/16/2019   Hyperlipidemia 12/16/2019   Hypothyroidism 12/16/2019   Insomnia 12/16/2019   Migraine 12/16/2019   Overweight 12/16/2019   Postoperative hypothyroidism 12/16/2019   Vitamin D  deficiency 12/16/2019   Hallux rigidus, right foot 12/05/2019   Hammer toe of right foot 12/05/2019   History of left ankle joint replacement 12/05/2019   Aftercare following ankle joint replacement surgery 01/10/2017   Anxiety, mild 12/12/2016   Atypical chest pain 12/12/2016   Cardiac murmur 12/12/2016  GERD (gastroesophageal reflux disease) 12/12/2016   History of thyroid  cancer 12/12/2016   Post-traumatic osteoarthritis of left ankle 10/03/2016   Chronic migraine without aura, with intractable migraine, so stated, with status migrainosus 09/26/2015   Joint pain 02/18/2015   Abnormal C-reactive protein 01/08/2015   Radiculopathy 03/06/2014   Osteoarthritis of right knee 04/01/2013   OA (osteoarthritis) 11/01/2011   OAB (overactive bladder)    Ulcerative colitis (HCC)    Cancer (HCC)    Carpal tunnel syndrome    RA (rheumatoid arthritis) (HCC)     Onset date: 2-3 years ago  REFERRING DIAG: R05.3 (ICD-10-CM) - Chronic cough     THERAPY DIAG:  Other voice  and resonance disorders  Rationale for Evaluation and Treatment: Rehabilitation  SUBJECTIVE:   SUBJECTIVE STATEMENT: Pt enters again with dry hacking cough and non-productive throat clears.  Pt accompanied by: self  PERTINENT HISTORY:  From neurology note 01/17/24: Terry Shannon is a 68 y.o. female with a history of migraine headaches without aura. Returns today for follow-up.  Overall she feels that her migraines have remained relatively stable.  She does state that in the last couple weeks they have increased in frequency but her next infusion of Vyepti  is due September 4.  She uses sumatriptan  hand with good benefit.  States that she rarely has to take a second dose.  She reports in regards to her CPAP there are some nights she falls asleep without putting it on and then wakes up later and puts it on.    PAIN:  Are you having pain? Yes: NPRS scale: 3/10 Pain location: fingers Pain description: ache Aggravating factors: activity/movement Relieving factors: rest  FALLS: Has patient fallen in last 6 months? No,    PATIENT GOALS: get rid of this hoarseness  OBJECTIVE:  Note: Objective measures were completed at Evaluation unless otherwise noted.  DIAGNOSTIC FINDINGS:  ENT- Terry Shannon 04/10/24: Flexible Fiberoptic Laryngoscopy Procedure Note Date of procedure 04/10/2024 Pre-Op Diagnosis: chronic cough                           Post Op Diagnosis: same Procedure: Flexible Fiberoptic Laryngoscopy CPT 31575 - Mod 25 Surgeon: Terry Shannon, D.O. Anesthesia: 4% lidocaine  with afrin Findings:  - mucopurulent drainage noted along the posterior pharyngeal wall with some pooling in the vallecula - bilateral vocal cord motion - interval improvement in vocal cord atrophy Procedure Detail: After verbal consent was obtained from the patient, the patient was brought in an upright position, a fiberoptic nasal laryngoscope was then passed into the patient right nasal passage and left nasal  passage, the left appeared to be more patent. It was passed along the floor of the nasal cavity to the nasopharynx. Torus tubarius was patent and the Fossa of Rosenmller was identified. The scope was then flexed caudally and advanced slowly through the nasopharynx, passed through the oropharynx, and down into the hypopharynx. The patient's oro- and nasopharynx were noted to have mucopurulent drainage.   The base of tongue was visualized and no mass, ulceration or lesion was appreciated. The epiglottis did not demonstrate any mass, ulceration or lesion. The vallecula was also assessed with no mass, ulceration or lesion. The patient had good glottic closure upon phonation and no signs of aspiration or pooling of secretions. 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 The patient was asked to inspire and expire with the true vocal folds vibrating normally and without evidence  of vocal fold dysfunction. The true and false vocal cords, interarytenoid, AE folds, and arytenoids did not demonstrate any significant edema or erythema. The patient was then asked to valsalva, and the pyriform sinuses were assessed which were unremarkable. The airway was patent and there was no evidence of compromise. The scope was then slowly withdrawn from the patient. The patient tolerated the procedure well and there were no complications.  Disposition: Stable   Impression & Plans:   Assessment and Plan Assessment & Plan Chronic cough with mucus production and possible upper respiratory infection - Start doxycycline  100 mg BID x 7 days - Start Voice therapy - scheduled for tomorrow   Allergic rhinitis with nasal congestion Nasal congestion affecting CPAP use. Ipratropium bromide  caused epistaxis, Flonase  caused migraines. - Prescribed Xyzal  (levocetirizine) 5 mg at night. - Prescribed Nasonex  (mometasone  furoate) nasal spray once daily in the morning. - Prescribed  azelastine  nasal spray twice daily, in the morning and at night.   ENT - Dr. Mila 04/01/24 Flexible Fiberoptic Laryngoscopy Procedure Note Date of procedure 04/01/2024 Pre-Op Diagnosis: chronic cough                           Post Op Diagnosis: same Procedure: Flexible Fiberoptic Laryngoscopy CPT 31575 - Mod 25 Surgeon: Terry Terry, D.O. 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 It was passed along the floor of the nasal cavity to the nasopharynx. Torus tubarius was patent and the Fossa of Rosenmller was identified. The scope was then flexed caudally and advanced slowly through the nasopharynx, passed through the oropharynx, and down into the hypopharynx. The patient's oro- and nasopharynx were noted to have mucopurulent drainage.   The base of tongue was visualized and no mass, ulceration or lesion was appreciated. The epiglottis did not demonstrate any mass, ulceration or lesion. The vallecula was also assessed with no mass, ulceration or lesion. The patient had good glottic closure upon phonation and no signs of aspiration or pooling of secretions. 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 The patient was asked to inspire and expire with the true vocal folds vibrating normally and without evidence of vocal fold dysfunction. The true and false vocal cords, interarytenoid, AE folds, and arytenoids did not demonstrate any significant edema or erythema. The patient was then asked to valsalva, and the  pyriform sinuses were assessed which were unremarkable. The airway was patent and there was no evidence of compromise. The scope was then slowly withdrawn from the patient. The patient tolerated the procedure well and there were no complications.  Disposition: Stable   Impression & Plans:   Assessment and Plan Assessment & Plan Chronic cough with mucus production and possible upper respiratory infection - Start doxycycline  100 mg BID x 7 days - Start Voice therapy - scheduled for tomorrow   Allergic rhinitis with nasal congestion Nasal congestion affecting CPAP use. Ipratropium bromide  caused epistaxis, Flonase  caused migraines. - Prescribed Xyzal  (levocetirizine) 5 mg at night. - Prescribed Nasonex  (mometasone  furoate) nasal spray once daily in the morning. -  Prescribed azelastine  nasal spray twice daily, in the morning and at night.  ENT - Dr. Mila 03/14/24 Flexible Fiberoptic Laryngoscopy Procedure Note Date of procedure 03/14/2024 Pre-Op Diagnosis: chronic cough                           Post Op Diagnosis: same Procedure: Flexible Fiberoptic Laryngoscopy CPT 31575 - Mod 25 Surgeon: Terry Terry, D.O. Anesthesia: 4% lidocaine  with afrin Findings:  - bilateral vocal cord atrophy - no masses or lesions Procedure Detail: After verbal consent was obtained from the patient, the patient was brought in an upright position, a fiberoptic nasal laryngoscope was then passed into the patient right nasal passage and left nasal passage, the left appeared to be more patent. It was passed along the floor of the nasal cavity to the nasopharynx. Torus tubarius was patent and the Fossa of Rosenmller was identified. The scope was then flexed caudally and advanced slowly through the nasopharynx, passed through the oropharynx, and down into the hypopharynx. The patient's oro- and nasopharynx were unremarkable with no signs of any gross lesions, edema, masses, or bleeding.  The base of tongue was  visualized and no mass, ulceration or lesion was appreciated. The epiglottis did not demonstrate any mass, ulceration or lesion. The vallecula was also assessed with no mass, ulceration or lesion. The patient had good glottic closure upon phonation and no signs of aspiration or pooling of secretions. The patient was asked to inspire and expire with the true vocal folds vibrating normally and without evidence of vocal fold dysfunction. Bilateral true vocal cords were noted to be thin and atrophic. The true and false vocal cords, interarytenoid, AE folds, and arytenoids did not demonstrate any significant edema or erythema. The patient was then asked to valsalva, and the pyriform sinuses were assessed which were unremarkable. The airway was patent and there was no evidence of compromise. The scope was then slowly withdrawn from the patient. The patient tolerated the procedure well and there were no complications.  Disposition: Stable   Impression & Plans: Gretna Bergin is a 68 y.o. female with chronic cough and right ear fullness.   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  Discussed risks, benefits, alternatives of the procedure.   Vasomotor rhinitis - Start Atrovent  twice daily   Eustachian tube dysfunction - Right TM with nonfunctional ear tube noted on exam - Will plan to replace ear tube in OR  MBS 01-11-24 Clinical Impression: Clinical Impression: Patient presents with functional oropharyneal swallow ability. Her swallow is characterized by decreased timing of laryngeal closure/epiglottic closure/deflection resulting in laryngeal penetration of thin liquids. No aspiration noted despite sequential swallows.  Minimal oral retention with liquids spilled into pharynx  and were also tracely penetrated.  Pharyngeal swallow is strong without significant retention.  Barium tablet taken with thin easily transited through oropharynx.  Of note, following exam, pt coughed and reported backflow of cracker and liquids into mouth that she subsequently re-swallowed.  No evidence of oropharynegal dysfunction to account for her symptoms. She was noted to clear her throat prior to study and cough and clear her throat throughout and after testing.  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  Thanks for this consult of this most pleasant pt.  Pt's symptoms were not replicated during today's test.   Factors that may increase risk of adverse event in presence of aspiration Noe & Lianne 2021): No data recorded   Recommendations/Plan: Swallowing Evaluation Recommendations Swallowing Evaluation Recommendations Recommendations: PO diet PO Diet Recommendation: Regular; Thin liquids (Level 0) Liquid Administration via: Cup; Straw Medication Administration: Whole meds with liquid Supervision: Patient able to self-feed Swallowing strategies  : Slow rate; Small bites/sips (start intake with liquids) Postural changes: Position pt fully upright for meals; Stay upright 30-60 min after meals Oral care recommendations: Oral care BID (2x/day)   PATIENT REPORTED OUTCOME MEASURES (PROM): [place] Laryngeal Hypersensitivity Questionnaire: score of 14.1 where normal is >17.1. and lower scores indicate greater affect of cough interfering with pt's QoL. ,and Leiester Cough Questionnaire 66/133 with lower scores indicating a greater affect of cough interfering with pt's QoL.                                                                                                                             TREATMENT DATE:  Abdominal Breathing = AB  06/16/23: Trisha did not practice AB since last  session. She cleared throat 6 times and coughed twice in the first 5 minutes prior to focus of AB. SLP began guiding pt through AB practice, and relaxation strategies practice, along with cont'd training for techniques for laryngeal control; Pt coughed once and cleared throat 11 times in the next 35 minutes. SLP drew pt's attention to this and strongly encouraged her to practice AB 12-15 minutes BID.  04/10/24: Pt saw Dr. Mila at 1330 today, as she was feeling like she could not clear mucous out of throat and when she did she had difficulty breathing. Dr. Mila, per her note, noted mucopurulent drainage. Pt has been practicing cough suppression strategies since the eval. In the first 5 minutes she had 8 throat clears and 2 coughs. SLP introduced abdominal breathing with pt and she was successful quickly, at rest. SLP also introduced pt to visualizations for relaxation during this time. In the last 10 minutes In these 15 minutes pt had 7 throat clears and 2 coughs. SLP reviewed pt's cough suppression strategies with her during the next 10 minutes and pt had 7 throat clears and 2 coughs. SLP pointed out that when pt not focused on AB or relaxation strategies for 5 minutes she had identical frequency to 15 minutes of focus on AB and relaxation strategies. Pt's breathing was whisper-sounding for the first 20 minutes of the session until SLP focused on cough suppression strategies with pt and then it was silent until session end when it became more noisy again.  Pt to practice AB 10-15 minutes BID, and work on quiet breathing with visulaizations.  04/03/24: SLP and pt talked about laryngeal control strategies and cough suppression strategies. Today these appeared to assist pt as she did not cough or throat clear > once during  the 1-2 mintues while practicing.   PATIENT EDUCATION: Education details: see treatment date Person educated: Patient Education method: Explanation, Demonstration, Verbal cues, and  Handouts Education comprehension: verbalized understanding, returned demonstration, verbal cues required, and needs further education  HOME EXERCISE PROGRAM: Cough suppression and laryngeal control strategies, relaxation strategies, AB  GOALS: Goals reviewed with patient? Yes  SHORT TERM GOALS: Target date: 04/30/24  Pt will demo AB at rest for 2 minutes without cough or throat clear Baseline: Goal status: met  2.  Pt will report use of a strategy to successfully suppress cough within 30 seconds between two sessions Baseline:  Goal status: INITIAL  3.  Pt will demo 4 strategies to suppress cough/control larynx in 3 sessions Baseline:  Goal status: INITIAL   LONG TERM GOALS: Target date: 06/02/24  Pt will demo AB with sentence responses 90% in 4 minutes without cough or throat clear  Baseline:  Goal status: INITIAL  2.  Pt will demo AB with 4 minutes conversation without cough or throat clear Baseline:  Goal status: INITIAL  3.  Pt will report use of a strategy to successfully suppress cough within 30 seconds in or between three sessions, after 05/02/24 Baseline:  Goal status: INITIAL  4.  Pt will improve at least one PROM Baseline:  Goal status: INITIAL   ASSESSMENT:  CLINICAL IMPRESSION: Patient is a 68 y.o. F who was seen today for voice therapy due to chronic cough. 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 See treatment date above for today's date for further details on today's session. Today pt cleared throat 17 times and coughed 2 times. On day of eval pt cleared throat 44 times and coughed 7 times. When she was asked then if her cough interfered with her ability to communicate, she answered, It is right now.  OBJECTIVE IMPAIRMENTS: include voice disorder. These impairments are limiting patient from effectively communicating at home and in  community. Factors affecting potential to achieve goals and functional outcome are severity of impairments.. Patient will benefit from skilled SLP services to address above impairments and improve overall function.  REHAB POTENTIAL: Good  PLAN:  SLP FREQUENCY: 1-2x/week  SLP DURATION: 8 weeks  PLANNED INTERVENTIONS: Internal/external aids, Functional tasks, SLP instruction and feedback, Compensatory strategies, Patient/family education, [phone] Treatment of speech (30 or 45 min) , and voice exercises, laryngeal control, and cough suppression strategies    Ashe Gago, CCC-SLP 04/15/2024, 3:31 PM  Referring diagnosis:   R05.3 (ICD-10-CM) - Chronic cough     Treatment diagnosis (if different than referring diagnosis): other voice and resonance disorders ICD-10-CM: R49.8   Date Symptoms Began: 05/23/2021 # of Visits requested: 17  Time period for Authorization: 04/03/24 to 06/02/24  What was this (referring dx) caused by? []  Surgery []  Fall []  Ongoing issue []  Arthritis [x]  Other: ____voice disorder________  Laterality: []  Rt []  Lt [x]  Both  Functional Tool & Score: [place]   Check all possible CPT codes:     See Planned Interventions listed in the Plan section of the Evaluation.     If [employer]: Choose 10 or less codes  If [employer]: Modalities are not covered  If [employer]: Check allowed ICD code combinations   If [facility] Plan or [employer]: Cognitive training not covered

## 2024-04-17 ENCOUNTER — Ambulatory Visit

## 2024-04-17 DIAGNOSIS — R498 Other voice and resonance disorders: Secondary | ICD-10-CM

## 2024-04-17 DIAGNOSIS — R059 Cough, unspecified: Secondary | ICD-10-CM

## 2024-04-17 DIAGNOSIS — J383 Other diseases of vocal cords: Secondary | ICD-10-CM | POA: Diagnosis not present

## 2024-04-17 DIAGNOSIS — R053 Chronic cough: Secondary | ICD-10-CM | POA: Diagnosis not present

## 2024-04-17 DIAGNOSIS — R1310 Dysphagia, unspecified: Secondary | ICD-10-CM

## 2024-04-17 NOTE — Therapy (Signed)
 OUTPATIENT SPEECH LANGUAGE PATHOLOGY VOICE TREATMENT   Patient Name: Terry Shannon MRN: [id number] DOB:12/25/1955, 68 y.o., female Today's Date: 04/17/2024  PCP: Terry Iha, MD REFERRING PROVIDER: Mila Lao, DO  END OF SESSION:  End of Session - 04/17/24 1645     Visit Number 4    Number of Visits 17    Date for Recertification  06/02/24    SLP Start Time 1404    SLP Stop Time  1445    SLP Time Calculation (min) 41 min    Activity Tolerance Patient tolerated treatment well            Past Medical History:  Diagnosis Date   Allergy     year-round, pt. 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   right   Complication of anesthesia    slow to wake up after gallbladder surgery   Depression    Eczema    ARMS AND HANDS   GERD (gastroesophageal reflux disease)    History of thyroid  cancer    Hypothyroidism    Migraines    Osteoarthritis    Overactive bladder    RA (rheumatoid arthritis) (HCC)    Sleep apnea    no CPAP use   Squamous cell carcinoma of skin 12/21/2017   in situ-mid chest ([id number])   Ulcerative colitis (HCC)    Past Surgical History:  Procedure Laterality Date   ABDOMINAL HERNIA REPAIR  03/13/2008   periumbilical ventral hernia/incisional hernia   BUNIONECTOMY Right    x 2 more   BUNIONECTOMY Left    BUNIONECTOMY WITH CHILECTOMY Right 12/16/2005   CARPAL TUNNEL RELEASE Right 07/13/2001   CARPAL TUNNEL RELEASE Left 08/10/2001   DILATION AND CURETTAGE OF UTERUS     HAMMER TOE SURGERY Left may 2014   HYSTEROSCOPY WITH D & C  03/01/2004   with exc. of endometrial polyp   KNEE ARTHROSCOPY Right 2013   LAPAROSCOPIC CHOLECYSTECTOMY  11/03/2006   LUMBAR FUSION  03/06/2014   l4  l5    nerve ablation lumbar      THYROIDECTOMY, PARTIAL Right prior to 2002   THYROIDECTOMY, PARTIAL Left 11/29/2000   and isthmus   TONSILLECTOMY  1961   TOTAL ANKLE REPLACEMENT Left 11/2016   with tendon repair   TOTAL KNEE  ARTHROPLASTY Right 04/01/2013   Procedure: RIGHT TOTAL KNEE ARTHROPLASTY;  Surgeon: Terry LITTIE Gavel, MD;  Location: [facility] OR;  Service: Orthopedics;  Laterality: Right;   Patient Active Problem List   Diagnosis Date Noted   Asthmatic bronchitis, mild persistent, uncomplicated 01/27/2022   Upper airway cough syndrome 01/27/2022   OSA on CPAP 04/27/2021   Intolerance of continuous positive airway pressure (CPAP) ventilation 06/17/2020   OSA (obstructive sleep apnea) 05/06/2020   Snoring 05/06/2020   Insomnia secondary to chronic pain 03/24/2020   History of sleep apnea 03/24/2020   Gasping for breath 03/24/2020   Abnormal glucose level 12/16/2019   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   Degeneration of lumbar intervertebral disc 12/16/2019   Hyperlipidemia 12/16/2019   Hypothyroidism 12/16/2019   Insomnia 12/16/2019   Migraine 12/16/2019   Overweight 12/16/2019   Postoperative hypothyroidism 12/16/2019   Vitamin D  deficiency 12/16/2019   Hallux rigidus, right foot 12/05/2019   Hammer toe of right foot 12/05/2019   History of left ankle joint replacement 12/05/2019   Aftercare following ankle joint replacement surgery 01/10/2017   Anxiety, mild 12/12/2016   Atypical chest pain 12/12/2016   Cardiac murmur 12/12/2016  GERD (gastroesophageal reflux disease) 12/12/2016   History of thyroid  cancer 12/12/2016   Post-traumatic osteoarthritis of left ankle 10/03/2016   Chronic migraine without aura, with intractable migraine, so stated, with status migrainosus 09/26/2015   Joint pain 02/18/2015   Abnormal C-reactive protein 01/08/2015   Radiculopathy 03/06/2014   Osteoarthritis of right knee 04/01/2013   OA (osteoarthritis) 11/01/2011   OAB (overactive bladder)    Ulcerative colitis (HCC)    Cancer (HCC)    Carpal tunnel syndrome    RA (rheumatoid arthritis) (HCC)     Onset date: 2-3 years ago  REFERRING DIAG: R05.3 (ICD-10-CM) - Chronic cough     THERAPY DIAG:  Other voice  and resonance disorders  Cough, unspecified type  Dysphagia, unspecified type  Rationale for Evaluation and Treatment: Rehabilitation  SUBJECTIVE:   SUBJECTIVE STATEMENT: Pt enters today demonstrating dry hacking cough and non-productive throat clears.  Pt accompanied by: self  PERTINENT HISTORY:  From neurology note 01/17/24: Terry Shannon is a 68 y.o. female with a history of migraine headaches without aura. Returns today for follow-up.  Overall she feels that her migraines have remained relatively stable.  She does state that in the last couple weeks they have increased in frequency but her next infusion of Vyepti  is due September 4.  She uses sumatriptan  hand with good benefit.  States that she rarely has to take a second dose.  She reports in regards to her CPAP there are some nights she falls asleep without putting it on and then wakes up later and puts it on.    PAIN:  Are you having pain? Yes: NPRS scale: 4/10 Pain location: rt throat Pain description: ache Aggravating factors: nothing Relieving factors: nothing  FALLS: Has patient fallen in last 6 months? No,    PATIENT GOALS: get rid of this hoarseness  OBJECTIVE:  Note: Objective measures were completed at Evaluation unless otherwise noted.  DIAGNOSTIC FINDINGS:  ENT- Terry Shannon 04/10/24: Flexible Fiberoptic Laryngoscopy Procedure Note Date of procedure 04/10/2024 Pre-Op Diagnosis: chronic cough                           Post Op Diagnosis: same Procedure: Flexible Fiberoptic Laryngoscopy CPT 31575 - Mod 25 Surgeon: Terry Shannon, D.O. Anesthesia: 4% lidocaine  with afrin Findings:  - mucopurulent drainage noted along the posterior pharyngeal wall with some pooling in the vallecula - bilateral vocal cord motion - interval improvement in vocal cord atrophy Procedure Detail: After verbal consent was obtained from the patient, the patient was brought in an upright position, a fiberoptic nasal laryngoscope was then  passed into the patient right nasal passage and left nasal passage, the left appeared to be more patent. It was passed along the floor of the nasal cavity to the nasopharynx. Torus tubarius was patent and the Fossa of Rosenmller was identified. The scope was then flexed caudally and advanced slowly through the nasopharynx, passed through the oropharynx, and down into the hypopharynx. The patient's oro- and nasopharynx were noted to have mucopurulent drainage.   The base of tongue was visualized and no mass, ulceration or lesion was appreciated. The epiglottis did not demonstrate any mass, ulceration or lesion. The vallecula was also assessed with no mass, ulceration or lesion. The patient had good glottic closure upon phonation and no signs of aspiration or pooling of secretions. 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 The patient was asked to inspire and expire with  the true vocal folds vibrating normally and without evidence of vocal fold dysfunction. The true and false vocal cords, interarytenoid, AE folds, and arytenoids did not demonstrate any significant edema or erythema. The patient was then asked to valsalva, and the pyriform sinuses were assessed which were unremarkable. The airway was patent and there was no evidence of compromise. The scope was then slowly withdrawn from the patient. The patient tolerated the procedure well and there were no complications.  Disposition: Stable   Impression & Plans:   Assessment and Plan Assessment & Plan Chronic cough with mucus production and possible upper respiratory infection - Start doxycycline  100 mg BID x 7 days - Start Voice therapy - scheduled for tomorrow   Allergic rhinitis with nasal congestion Nasal congestion affecting CPAP use. Ipratropium bromide  caused epistaxis, Flonase  caused migraines. - Prescribed Xyzal  (levocetirizine) 5 mg at night. - Prescribed Nasonex  (mometasone   furoate) nasal spray once daily in the morning. - Prescribed azelastine  nasal spray twice daily, in the morning and at night.   ENT - Dr. Mila 04/01/24 Flexible Fiberoptic Laryngoscopy Procedure Note Date of procedure 04/01/2024 Pre-Op Diagnosis: chronic cough                           Post Op Diagnosis: same Procedure: Flexible Fiberoptic Laryngoscopy CPT 31575 - Mod 25 Surgeon: Terry Terry, D.O. 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 It was passed along the floor of the nasal cavity to the nasopharynx. Torus tubarius was patent and the Fossa of Rosenmller was identified. The scope was then flexed caudally and advanced slowly through the nasopharynx, passed through the oropharynx, and down into the hypopharynx. The patient's oro- and nasopharynx were noted to have mucopurulent drainage.   The base of tongue was visualized and no mass, ulceration or lesion was appreciated. The epiglottis did not demonstrate any mass, ulceration or lesion. The vallecula was also assessed with no mass, ulceration or lesion. The patient had good glottic closure upon phonation and no signs of aspiration or pooling of secretions. 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 The patient was asked to inspire and expire with the true vocal folds vibrating normally and without evidence of vocal fold dysfunction. The true and false vocal cords, interarytenoid, AE folds, and arytenoids did not demonstrate any significant edema  or erythema. The patient was then asked to valsalva, and the pyriform sinuses were assessed which were unremarkable. The airway was patent and there was no evidence of compromise. The scope was then slowly withdrawn from the patient. The patient tolerated the procedure well and there were no complications.  Disposition: Stable   Impression & Plans:   Assessment and Plan Assessment & Plan Chronic cough with mucus production and possible upper respiratory infection - Start doxycycline  100 mg BID x 7 days - Start Voice therapy - scheduled for tomorrow   Allergic rhinitis with nasal congestion Nasal congestion affecting CPAP use. Ipratropium bromide  caused epistaxis, Flonase  caused migraines. - Prescribed Xyzal  (levocetirizine) 5 mg at night. - Prescribed Nasonex  (  mometasone  furoate) nasal spray once daily in the morning. - Prescribed azelastine  nasal spray twice daily, in the morning and at night.  ENT - Dr. Mila 03/14/24 Flexible Fiberoptic Laryngoscopy Procedure Note Date of procedure 03/14/2024 Pre-Op Diagnosis: chronic cough                           Post Op Diagnosis: same Procedure: Flexible Fiberoptic Laryngoscopy CPT 31575 - Mod 25 Surgeon: Terry Terry, D.O. Anesthesia: 4% lidocaine  with afrin Findings:  - bilateral vocal cord atrophy - no masses or lesions Procedure Detail: After verbal consent was obtained from the patient, the patient was brought in an upright position, a fiberoptic nasal laryngoscope was then passed into the patient right nasal passage and left nasal passage, the left appeared to be more patent. It was passed along the floor of the nasal cavity to the nasopharynx. Torus tubarius was patent and the Fossa of Rosenmller was identified. The scope was then flexed caudally and advanced slowly through the nasopharynx, passed through the oropharynx, and down into the hypopharynx. The patient's oro- and nasopharynx were unremarkable with no signs of any gross lesions,  edema, masses, or bleeding.  The base of tongue was visualized and no mass, ulceration or lesion was appreciated. The epiglottis did not demonstrate any mass, ulceration or lesion. The vallecula was also assessed with no mass, ulceration or lesion. The patient had good glottic closure upon phonation and no signs of aspiration or pooling of secretions. The patient was asked to inspire and expire with the true vocal folds vibrating normally and without evidence of vocal fold dysfunction. Bilateral true vocal cords were noted to be thin and atrophic. The true and false vocal cords, interarytenoid, AE folds, and arytenoids did not demonstrate any significant edema or erythema. The patient was then asked to valsalva, and the pyriform sinuses were assessed which were unremarkable. The airway was patent and there was no evidence of compromise. The scope was then slowly withdrawn from the patient. The patient tolerated the procedure well and there were no complications.  Disposition: Stable   Impression & Plans: Terry Shannon is a 68 y.o. female with chronic cough and right ear fullness.   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  Discussed risks, benefits, alternatives of the procedure.   Vasomotor rhinitis - Start Atrovent  twice daily   Eustachian tube dysfunction - Right TM with nonfunctional ear tube noted on exam - Will plan to replace ear tube in OR  MBS 01-11-24 Clinical Impression: Clinical Impression: Patient presents with functional oropharyneal swallow ability. Her swallow is characterized by decreased timing of laryngeal closure/epiglottic closure/deflection resulting in laryngeal penetration of thin liquids. No aspiration noted despite sequential swallows.   Minimal oral retention with liquids spilled into pharynx and were also tracely penetrated.  Pharyngeal swallow is strong without significant retention.  Barium tablet taken with thin easily transited through oropharynx.  Of note, following exam, pt coughed and reported backflow of cracker and liquids into mouth that she subsequently re-swallowed.  No evidence of oropharynegal dysfunction to account for her symptoms. She was noted to clear her throat prior to study and cough and clear her throat throughout and after testing.  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  Thanks for this consult of this most pleasant pt.  Pt's symptoms were not replicated during today's test.   Factors that may increase risk of adverse event in presence of aspiration Noe & Lianne 2021): No data recorded   Recommendations/Plan: Swallowing Evaluation Recommendations Swallowing Evaluation Recommendations Recommendations: PO diet PO Diet Recommendation: Regular; Thin liquids (Level 0) Liquid Administration via: Cup; Straw Medication Administration: Whole meds with liquid Supervision: Patient able to self-feed Swallowing strategies  : Slow rate; Small bites/sips (start intake with liquids) Postural changes: Position pt fully upright for meals; Stay upright 30-60 min after meals Oral care recommendations: Oral care BID (2x/day)   PATIENT REPORTED OUTCOME MEASURES (PROM): [place] Laryngeal Hypersensitivity Questionnaire: score of 14.1 where normal is >17.1. and lower scores indicate greater affect of cough interfering with pt's QoL. ,and Leiester Cough Questionnaire 66/133 with lower scores indicating a greater affect of cough interfering with pt's QoL.                                                                                                                             TREATMENT DATE:  Abdominal Breathing =  AB  04/17/24: Danissa worked on AB more than between previous appointment and two appointments ago but still not at recommended level. Pt with very noisy breathing upon entry into ST room so SLP guided pt through working on AB and laryngeal relaxation techniques due to noisy breathing.This took 12 minutes to ultimately achieve close-to-silent breathing using visualizations and verbal cues, with explanations. Pt with 7 throat clear and 4 coughs today, with only 4 clears and one cough in the 30 minutes SLP worked with pt focusing on AB. SLP strongly encouraged pt to adhere to 15 minutes of practice BID and reiterated the rationale for this.   04/15/24: Dalylah did not practice AB since last session. She cleared throat 6 times and coughed twice in the first 5 minutes prior to focus of AB. SLP began guiding pt through AB practice, and relaxation strategies practice, along with cont'd training for techniques for laryngeal control; Pt coughed once and cleared throat 11 times in the next 35 minutes. SLP drew pt's attention to this and strongly encouraged her to practice AB 12-15 minutes BID.  04/10/24: Pt saw Dr. Mila at 1330 today, as she was feeling like she could not clear mucous out of throat and when she did she had difficulty breathing. Dr. Mila, per her note, noted mucopurulent drainage. Pt has been practicing cough suppression strategies since the eval. In the first 5 minutes she had 8 throat clears and 2 coughs. SLP introduced abdominal breathing with pt and she was successful quickly, at rest. SLP also introduced pt to visualizations for relaxation during this time. In the last 10 minutes In these 15 minutes pt had 7 throat clears and 2 coughs. SLP reviewed pt's cough suppression strategies with her during the next 10 minutes and  pt had 7 throat clears and 2 coughs. SLP pointed out that when pt not focused on AB or relaxation strategies for 5 minutes she had identical frequency to 15 minutes of focus on  AB and relaxation strategies. Pt's breathing was whisper-sounding for the first 20 minutes of the session until SLP focused on cough suppression strategies with pt and then it was silent until session end when it became more noisy again.  Pt to practice AB 10-15 minutes BID, and work on quiet breathing with visulaizations.  04/03/24: SLP and pt talked about laryngeal control strategies and cough suppression strategies. Today these appeared to assist pt as she did not cough or throat clear > once during the 1-2 mintues while practicing.   PATIENT EDUCATION: Education details: see treatment date Person educated: Patient Education method: Explanation, Demonstration, Verbal cues, and Handouts Education comprehension: verbalized understanding, returned demonstration, verbal cues required, and needs further education  HOME EXERCISE PROGRAM: Cough suppression and laryngeal control strategies, relaxation strategies, AB  GOALS: Goals reviewed with patient? Yes  SHORT TERM GOALS: Target date: 04/30/24  Pt will demo AB at rest for 2 minutes without cough or throat clear Baseline: Goal status: met  2.  Pt will report use of a strategy to successfully suppress cough within 30 seconds between two sessions Baseline:  Goal status: INITIAL  3.  Pt will demo 4 strategies to suppress cough/control larynx in 3 sessions Baseline:  Goal status: INITIAL   LONG TERM GOALS: Target date: 06/02/24  Pt will demo AB with sentence responses 90% in 4 minutes without cough or throat clear  Baseline:  Goal status: INITIAL  2.  Pt will demo AB with 4 minutes conversation without cough or throat clear Baseline:  Goal status: INITIAL  3.  Pt will report use of a strategy to successfully suppress cough within 30 seconds in or between three sessions, after 05/02/24 Baseline:  Goal status: INITIAL  4.  Pt will improve at least one PROM Baseline:  Goal status: INITIAL   ASSESSMENT:  CLINICAL  IMPRESSION: Patient is a 68 y.o. F who was seen today for voice therapy due to chronic cough. 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 See treatment date above for today's date for further details on today's session. Today pt cleared throat 7 times and coughed 4 times. On day of eval pt cleared throat 44 times and coughed 7 times. When she was asked then if her cough interfered with her ability to communicate, she answered, It is right now.  OBJECTIVE IMPAIRMENTS: include voice disorder. These impairments are limiting patient from effectively communicating at home and in community. Factors affecting potential to achieve goals and functional outcome are severity of impairments.. Patient will benefit from skilled SLP services to address above impairments and improve overall function.  REHAB POTENTIAL: Good  PLAN:  SLP FREQUENCY: 1-2x/week  SLP DURATION: 8 weeks  PLANNED INTERVENTIONS: Internal/external aids, Functional tasks, SLP instruction and feedback, Compensatory strategies, Patient/family education, [phone] Treatment of speech (30 or 45 min) , and voice exercises, laryngeal control, and cough suppression strategies    Shawntina Diffee, CCC-SLP 04/17/2024, 4:45 PM  Referring diagnosis:   R05.3 (ICD-10-CM) - Chronic cough     Treatment diagnosis (if different than referring diagnosis): other voice and resonance disorders ICD-10-CM: R49.8   Date Symptoms Began: 05/23/2021 # of Visits requested: 17  Time period for Authorization: 04/03/24  to 06/02/24  What was this (referring dx) caused by? []  Surgery []  Fall []  Ongoing issue []  Arthritis [x]  Other: ____voice disorder________  Laterality: []  Rt []  Lt [x]  Both  Functional Tool & Score: [place]   Check all possible CPT codes:     See Planned Interventions listed in the Plan section of the Evaluation.      If [employer]: Choose 10 or less codes  If [employer]: Modalities are not covered  If [employer]: Check allowed ICD code combinations   If [facility] Plan or [employer]: Cognitive training not covered

## 2024-04-23 DIAGNOSIS — G43711 Chronic migraine without aura, intractable, with status migrainosus: Secondary | ICD-10-CM | POA: Diagnosis not present

## 2024-04-24 ENCOUNTER — Ambulatory Visit

## 2024-04-24 DIAGNOSIS — R1310 Dysphagia, unspecified: Secondary | ICD-10-CM | POA: Diagnosis present

## 2024-04-24 DIAGNOSIS — M069 Rheumatoid arthritis, unspecified: Secondary | ICD-10-CM | POA: Diagnosis not present

## 2024-04-24 DIAGNOSIS — E890 Postprocedural hypothyroidism: Secondary | ICD-10-CM | POA: Diagnosis not present

## 2024-04-24 DIAGNOSIS — R7309 Other abnormal glucose: Secondary | ICD-10-CM | POA: Diagnosis not present

## 2024-04-24 DIAGNOSIS — R498 Other voice and resonance disorders: Secondary | ICD-10-CM | POA: Diagnosis present

## 2024-04-24 DIAGNOSIS — E559 Vitamin D deficiency, unspecified: Secondary | ICD-10-CM | POA: Diagnosis not present

## 2024-04-24 DIAGNOSIS — E782 Mixed hyperlipidemia: Secondary | ICD-10-CM | POA: Diagnosis not present

## 2024-04-24 NOTE — Therapy (Signed)
 OUTPATIENT SPEECH LANGUAGE PATHOLOGY VOICE TREATMENT   Patient Name: Terry Shannon MRN: [id number] DOB:[birth year], 68 y.o., female Today's Date: 04/24/2024  PCP: Theo Iha, MD REFERRING PROVIDER: Mila Lao, DO  END OF SESSION:  End of Session - 04/24/24 1454     Visit Number 5    Number of Visits 17    Date for Recertification  06/02/24    SLP Start Time 1450    SLP Stop Time  1530    SLP Time Calculation (min) 40 min    Activity Tolerance Patient tolerated treatment well             Past Medical History:  Diagnosis Date   Allergy     year-round, pt. 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   right   Complication of anesthesia    slow to wake up after gallbladder surgery   Depression    Eczema    ARMS AND HANDS   GERD (gastroesophageal reflux disease)    History of thyroid  cancer    Hypothyroidism    Migraines    Osteoarthritis    Overactive bladder    RA (rheumatoid arthritis) (HCC)    Sleep apnea    no CPAP use   Squamous cell carcinoma of skin 12/21/2017   in situ-mid chest ([id number])   Ulcerative colitis (HCC)    Past Surgical History:  Procedure Laterality Date   ABDOMINAL HERNIA REPAIR  03/13/2008   periumbilical ventral hernia/incisional hernia   BUNIONECTOMY Right    x 2 more   BUNIONECTOMY Left    BUNIONECTOMY WITH CHILECTOMY Right 12/16/2005   CARPAL TUNNEL RELEASE Right 07/13/2001   CARPAL TUNNEL RELEASE Left 08/10/2001   DILATION AND CURETTAGE OF UTERUS     HAMMER TOE SURGERY Left may 2014   HYSTEROSCOPY WITH D & C  03/01/2004   with exc. of endometrial polyp   KNEE ARTHROSCOPY Right 2013   LAPAROSCOPIC CHOLECYSTECTOMY  11/03/2006   LUMBAR FUSION  03/06/2014   l4  l5    nerve ablation lumbar      THYROIDECTOMY, PARTIAL Right prior to 2002   THYROIDECTOMY, PARTIAL Left 11/29/2000   and isthmus   TONSILLECTOMY  1961   TOTAL ANKLE REPLACEMENT Left 11/2016   with tendon repair   TOTAL KNEE  ARTHROPLASTY Right 04/01/2013   Procedure: RIGHT TOTAL KNEE ARTHROPLASTY;  Surgeon: [name], MD;  Location: [facility] OR;  Service: Orthopedics;  Laterality: Right;   Patient Active Problem List   Diagnosis Date Noted   Asthmatic bronchitis, mild persistent, uncomplicated 01/27/2022   Upper airway cough syndrome 01/27/2022   OSA on CPAP 04/27/2021   Intolerance of continuous positive airway pressure (CPAP) ventilation 06/17/2020   OSA (obstructive sleep apnea) 05/06/2020   Snoring 05/06/2020   Insomnia secondary to chronic pain 03/24/2020   History of sleep apnea 03/24/2020   Gasping for breath 03/24/2020   Abnormal glucose level 12/16/2019   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   Degeneration of lumbar intervertebral disc 12/16/2019   Hyperlipidemia 12/16/2019   Hypothyroidism 12/16/2019   Insomnia 12/16/2019   Migraine 12/16/2019   Overweight 12/16/2019   Postoperative hypothyroidism 12/16/2019   Vitamin D  deficiency 12/16/2019   Hallux rigidus, right foot 12/05/2019   Hammer toe of right foot 12/05/2019   History of left ankle joint replacement 12/05/2019   Aftercare following ankle joint replacement surgery 01/10/2017   Anxiety, mild 12/12/2016   Atypical chest pain 12/12/2016   Cardiac murmur  12/12/2016   GERD (gastroesophageal reflux disease) 12/12/2016   History of thyroid  cancer 12/12/2016   Post-traumatic osteoarthritis of left ankle 10/03/2016   Chronic migraine without aura, with intractable migraine, so stated, with status migrainosus 09/26/2015   Joint pain 02/18/2015   Abnormal C-reactive protein 01/08/2015   Radiculopathy 03/06/2014   Osteoarthritis of right knee 04/01/2013   OA (osteoarthritis) 11/01/2011   OAB (overactive bladder)    Ulcerative colitis (HCC)    Cancer (HCC)    Carpal tunnel syndrome    RA (rheumatoid arthritis) (HCC)     Onset date: 2-3 years ago  REFERRING DIAG: R05.3 (ICD-10-CM) - Chronic cough     THERAPY DIAG:  Other voice  and resonance disorders  Rationale for Evaluation and Treatment: Rehabilitation  SUBJECTIVE:   SUBJECTIVE STATEMENT: Arrives today with harsh strained-sounding voice, with unproductive cough and throat clears.  Pt accompanied by: self  PERTINENT HISTORY:  From neurology note 01/17/24: Terry Shannon is a 68 y.o. female with a history of migraine headaches without aura. Returns today for follow-up.  Overall she feels that her migraines have remained relatively stable.  She does state that in the last couple weeks they have increased in frequency but her next infusion of Vyepti  is due September 4.  She uses sumatriptan  hand with good benefit.  States that she rarely has to take a second dose.  She reports in regards to her CPAP there are some nights she falls asleep without putting it on and then wakes up later and puts it on.    PAIN:  Are you having pain? Yes: NPRS scale: 4/10 Pain location: rt throat Pain description: ache Aggravating factors: nothing Relieving factors: nothing  FALLS: Has patient fallen in last 6 months? No,    PATIENT GOALS: get rid of this hoarseness  OBJECTIVE:  Note: Objective measures were completed at Evaluation unless otherwise noted.  DIAGNOSTIC FINDINGS:  ENT- Dharap 04/10/24: Flexible Fiberoptic Laryngoscopy Procedure Note Date of procedure 04/10/2024 Pre-Op Diagnosis: chronic cough                           Post Op Diagnosis: same Procedure: Flexible Fiberoptic Laryngoscopy CPT 31575 - Mod 25 Surgeon: Adah Malkin, D.O. Anesthesia: 4% lidocaine  with afrin Findings:  - mucopurulent drainage noted along the posterior pharyngeal wall with some pooling in the vallecula - bilateral vocal cord motion - interval improvement in vocal cord atrophy Procedure Detail: After verbal consent was obtained from the patient, the patient was brought in an upright position, a fiberoptic nasal laryngoscope was then passed into the patient right nasal passage and  left nasal passage, the left appeared to be more patent. It was passed along the floor of the nasal cavity to the nasopharynx. Torus tubarius was patent and the Fossa of Rosenmller was identified. The scope was then flexed caudally and advanced slowly through the nasopharynx, passed through the oropharynx, and down into the hypopharynx. The patient's oro- and nasopharynx were noted to have mucopurulent drainage.   The base of tongue was visualized and no mass, ulceration or lesion was appreciated. The epiglottis did not demonstrate any mass, ulceration or lesion. The vallecula was also assessed with no mass, ulceration or lesion. The patient had good glottic closure upon phonation and no signs of aspiration or pooling of secretions. 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 The patient was asked to inspire and expire with the true vocal folds  vibrating normally and without evidence of vocal fold dysfunction. The true and false vocal cords, interarytenoid, AE folds, and arytenoids did not demonstrate any significant edema or erythema. The patient was then asked to valsalva, and the pyriform sinuses were assessed which were unremarkable. The airway was patent and there was no evidence of compromise. The scope was then slowly withdrawn from the patient. The patient tolerated the procedure well and there were no complications.  Disposition: Stable   Impression & Plans:   Assessment and Plan Assessment & Plan Chronic cough with mucus production and possible upper respiratory infection - Start doxycycline  100 mg BID x 7 days - Start Voice therapy - scheduled for tomorrow   Allergic rhinitis with nasal congestion Nasal congestion affecting CPAP use. Ipratropium bromide  caused epistaxis, Flonase  caused migraines. - Prescribed Xyzal  (levocetirizine) 5 mg at night. - Prescribed Nasonex  (mometasone  furoate) nasal spray once daily in the morning. -  Prescribed azelastine  nasal spray twice daily, in the morning and at night.   ENT - Dr. Mila 04/01/24 Flexible Fiberoptic Laryngoscopy Procedure Note Date of procedure 04/01/2024 Pre-Op Diagnosis: chronic cough                           Post Op Diagnosis: same Procedure: Flexible Fiberoptic Laryngoscopy CPT 31575 - Mod 25 Surgeon: Adah Mila, D.O. 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 It was passed along the floor of the nasal cavity to the nasopharynx. Torus tubarius was patent and the Fossa of Rosenmller was identified. The scope was then flexed caudally and advanced slowly through the nasopharynx, passed through the oropharynx, and down into the hypopharynx. The patient's oro- and nasopharynx were noted to have mucopurulent drainage.   The base of tongue was visualized and no mass, ulceration or lesion was appreciated. The epiglottis did not demonstrate any mass, ulceration or lesion. The vallecula was also assessed with no mass, ulceration or lesion. The patient had good glottic closure upon phonation and no signs of aspiration or pooling of secretions. 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 The patient was asked to inspire and expire with the true vocal folds vibrating normally and without evidence of vocal fold dysfunction. The true and false vocal cords, interarytenoid, AE folds, and arytenoids did not demonstrate any significant edema or erythema. The patient was then asked to valsalva,  and the pyriform sinuses were assessed which were unremarkable. The airway was patent and there was no evidence of compromise. The scope was then slowly withdrawn from the patient. The patient tolerated the procedure well and there were no complications.  Disposition: Stable   Impression & Plans:   Assessment and Plan Assessment & Plan Chronic cough with mucus production and possible upper respiratory infection - Start doxycycline  100 mg BID x 7 days - Start Voice therapy - scheduled for tomorrow   Allergic rhinitis with nasal congestion Nasal congestion affecting CPAP use. Ipratropium bromide  caused epistaxis, Flonase  caused migraines. - Prescribed Xyzal  (levocetirizine) 5 mg at night. - Prescribed Nasonex  (mometasone  furoate) nasal spray  once daily in the morning. - Prescribed azelastine  nasal spray twice daily, in the morning and at night.  ENT - Dr. Mila 03/14/24 Flexible Fiberoptic Laryngoscopy Procedure Note Date of procedure 03/14/2024 Pre-Op Diagnosis: chronic cough                           Post Op Diagnosis: same Procedure: Flexible Fiberoptic Laryngoscopy CPT 31575 - Mod 25 Surgeon: Adah Mila, D.O. Anesthesia: 4% lidocaine  with afrin Findings:  - bilateral vocal cord atrophy - no masses or lesions Procedure Detail: After verbal consent was obtained from the patient, the patient was brought in an upright position, a fiberoptic nasal laryngoscope was then passed into the patient right nasal passage and left nasal passage, the left appeared to be more patent. It was passed along the floor of the nasal cavity to the nasopharynx. Torus tubarius was patent and the Fossa of Rosenmller was identified. The scope was then flexed caudally and advanced slowly through the nasopharynx, passed through the oropharynx, and down into the hypopharynx. The patient's oro- and nasopharynx were unremarkable with no signs of any gross lesions, edema, masses, or bleeding.  The base of tongue  was visualized and no mass, ulceration or lesion was appreciated. The epiglottis did not demonstrate any mass, ulceration or lesion. The vallecula was also assessed with no mass, ulceration or lesion. The patient had good glottic closure upon phonation and no signs of aspiration or pooling of secretions. The patient was asked to inspire and expire with the true vocal folds vibrating normally and without evidence of vocal fold dysfunction. Bilateral true vocal cords were noted to be thin and atrophic. The true and false vocal cords, interarytenoid, AE folds, and arytenoids did not demonstrate any significant edema or erythema. The patient was then asked to valsalva, and the pyriform sinuses were assessed which were unremarkable. The airway was patent and there was no evidence of compromise. The scope was then slowly withdrawn from the patient. The patient tolerated the procedure well and there were no complications.  Disposition: Stable   Impression & Plans: Terry Shannon is a 68 y.o. female with chronic cough and right ear fullness.   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  Discussed risks, benefits, alternatives of the procedure.   Vasomotor rhinitis - Start Atrovent  twice daily   Eustachian tube dysfunction - Right TM with nonfunctional ear tube noted on exam - Will plan to replace ear tube in OR  MBS 01-11-24 Clinical Impression: Clinical Impression: Patient presents with functional oropharyneal swallow ability. Her swallow is characterized by decreased timing of laryngeal closure/epiglottic closure/deflection resulting in laryngeal penetration of thin liquids. No aspiration noted despite sequential swallows.  Minimal oral retention with liquids spilled into  pharynx and were also tracely penetrated.  Pharyngeal swallow is strong without significant retention.  Barium tablet taken with thin easily transited through oropharynx.  Of note, following exam, pt coughed and reported backflow of cracker and liquids into mouth that she subsequently re-swallowed.  No evidence of oropharynegal dysfunction to account for her symptoms. She was noted to clear her throat prior to study and cough and clear her throat throughout and after testing.  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  Thanks for this consult of this most pleasant pt.  Pt's symptoms were not replicated during today's test.   Factors that may increase risk of adverse event in presence of aspiration Terry Shannon & Terry Shannon 2021): No data recorded   Recommendations/Plan: Swallowing Evaluation Recommendations Swallowing Evaluation Recommendations Recommendations: PO diet PO Diet Recommendation: Regular; Thin liquids (Level 0) Liquid Administration via: Cup; Straw Medication Administration: Whole meds with liquid Supervision: Patient able to self-feed Swallowing strategies  : Slow rate; Small bites/sips (start intake with liquids) Postural changes: Position pt fully upright for meals; Stay upright 30-60 min after meals Oral care recommendations: Oral care BID (2x/day)   PATIENT REPORTED OUTCOME MEASURES (PROM): [place] Laryngeal Hypersensitivity Questionnaire: score of 14.1 where normal is >17.1. and lower scores indicate greater affect of cough interfering with pt's QoL. ,and Leiester Cough Questionnaire 66/133 with lower scores indicating a greater affect of cough interfering with pt's QoL.                                                                                                                             TREATMENT DATE:  Abdominal Breathing = AB  04/24/24: Pt was sleeping and felt like she  had something in her throat that wouldn't get coughed up or swallowed down. O2 sat decr'd to 93 SpO2, and she ate and it got pushed down.  Audible respiration heard as pt entered ST room. Pt with 2 throat clears and 4 coughs prior to focus on AB. One cough 40 seconds into AB practice at rest. 8 minutes 15 seconds later, no coughs or throat clears were heard as pt cont'd to feel AB. Audible respirations for 4 minutes before pt reminded pt of relaxation strategies and at 6 minutes 35 seconds respirations were silent. 5 throat clears/coughs after this time. SLP strongly reiterated to pt she MUST practice AB 15 minutes BID, and practice cough suppression and laryngeal control strategies throughout the day for  more success. SLP further educated pt why SLP suspected she was having audible respirations due to vocal fold tension; SLP reviewed how helpful imagery can be and suggested pt think of a few images that will assist her relaxation of her laryngeal area. It IS tight there, pt stated during this practice with SLP for imagery. Pt respiration was silent when she left the ST room.  04/17/24: Terry Shannon worked on AB more than between previous appointment and two appointments ago but still not at recommended level. Pt with very noisy breathing upon entry into ST room so SLP guided pt through working on AB and laryngeal relaxation techniques due to noisy breathing.This took 12 minutes to ultimately achieve close-to-silent breathing using visualizations and verbal cues, with explanations. Pt with 7 throat clear and 4 coughs today, with only 4 clears and one cough in the 30 minutes SLP worked with pt focusing on AB. SLP strongly encouraged pt to adhere to 15 minutes of practice BID and  reiterated the rationale for this.   04/15/24: Terry Shannon did not practice AB since last session. She cleared throat 6 times and coughed twice in the first 5 minutes prior to focus of AB. SLP began guiding pt through AB practice, and  relaxation strategies practice, along with cont'd training for techniques for laryngeal control; Pt coughed once and cleared throat 11 times in the next 35 minutes. SLP drew pt's attention to this and strongly encouraged her to practice AB 12-15 minutes BID.  04/10/24: Pt saw Dr. Mila at 1330 today, as she was feeling like she could not clear mucous out of throat and when she did she had difficulty breathing. Dr. Mila, per her note, noted mucopurulent drainage. Pt has been practicing cough suppression strategies since the eval. In the first 5 minutes she had 8 throat clears and 2 coughs. SLP introduced abdominal breathing with pt and she was successful quickly, at rest. SLP also introduced pt to visualizations for relaxation during this time. In the last 10 minutes In these 15 minutes pt had 7 throat clears and 2 coughs. SLP reviewed pt's cough suppression strategies with her during the next 10 minutes and pt had 7 throat clears and 2 coughs. SLP pointed out that when pt not focused on AB or relaxation strategies for 5 minutes she had identical frequency to 15 minutes of focus on AB and relaxation strategies. Pt's breathing was whisper-sounding for the first 20 minutes of the session until SLP focused on cough suppression strategies with pt and then it was silent until session end when it became more noisy again.  Pt to practice AB 10-15 minutes BID, and work on quiet breathing with visulaizations.  04/03/24: SLP and pt talked about laryngeal control strategies and cough suppression strategies. Today these appeared to assist pt as she did not cough or throat clear > once during the 1-2 mintues while practicing.   PATIENT EDUCATION: Education details: see treatment date Person educated: Patient Education method: Explanation, Demonstration, Verbal cues, and Handouts Education comprehension: verbalized understanding, returned demonstration, verbal cues required, and needs further education  HOME  EXERCISE PROGRAM: Cough suppression and laryngeal control strategies, relaxation strategies, AB  GOALS: Goals reviewed with patient? Yes  SHORT TERM GOALS: Target date: 04/30/24  Pt will demo AB at rest for 2 minutes without cough or throat clear Baseline: Goal status: met  2.  Pt will report use of a strategy to successfully suppress cough within 30 seconds between two sessions Baseline:  Goal status: INITIAL  3.  Pt will demo 4 strategies to suppress cough/control larynx in 3 sessions Baseline:  Goal status: INITIAL   LONG TERM GOALS: Target date: 06/02/24  Pt will demo AB with sentence responses 90% in 4 minutes without cough or throat clear  Baseline:  Goal status: INITIAL  2.  Pt will demo AB with 4 minutes conversation without cough or throat clear Baseline:  Goal status: INITIAL  3.  Pt will report use of a strategy to successfully suppress cough within 30 seconds in or between three sessions, after 05/02/24 Baseline:  Goal status: INITIAL  4.  Pt will improve at least one PROM Baseline:  Goal status: INITIAL   ASSESSMENT:  CLINICAL IMPRESSION: Patient is a 68 y.o. F who was seen today for voice therapy due to chronic cough. 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 See treatment date above for  today's date for further details on today's session. Today pt cleared throat 5 times and coughed 6 times. On day of eval pt cleared throat 44 times and coughed 7 times. When she was asked then if her cough interfered with her ability to communicate, she answered, It is right now.  OBJECTIVE IMPAIRMENTS: include voice disorder. These impairments are limiting patient from effectively communicating at home and in community. Factors affecting potential to achieve goals and functional outcome are severity of impairments.. Patient will benefit from skilled  SLP services to address above impairments and improve overall function.  REHAB POTENTIAL: Good  PLAN:  SLP FREQUENCY: 1-2x/week  SLP DURATION: 8 weeks  PLANNED INTERVENTIONS: Internal/external aids, Functional tasks, SLP instruction and feedback, Compensatory strategies, Patient/family education, [phone] Treatment of speech (30 or 45 min) , and voice exercises, laryngeal control, and cough suppression strategies    Terry Shannon, CCC-SLP 04/24/2024, 2:54 PM  Referring diagnosis:   R05.3 (ICD-10-CM) - Chronic cough     Treatment diagnosis (if different than referring diagnosis): other voice and resonance disorders ICD-10-CM: R49.8   Date Symptoms Began: 05/23/2021 # of Visits requested: 17  Time period for Authorization: 04/03/24 to 06/02/24  What was this (referring dx) caused by? []  Surgery []  Fall []  Ongoing issue []  Arthritis [x]  Other: ____voice disorder________  Laterality: []  Rt []  Lt [x]  Both  Functional Tool & Score: [place]   Check all possible CPT codes:     See Planned Interventions listed in the Plan section of the Evaluation.     If [employer]: Choose 10 or less codes  If [employer]: Modalities are not covered  If [employer]: Check allowed ICD code combinations   If [facility] Plan or [employer]: Cognitive training not covered

## 2024-04-26 ENCOUNTER — Ambulatory Visit

## 2024-04-26 DIAGNOSIS — R498 Other voice and resonance disorders: Secondary | ICD-10-CM | POA: Diagnosis not present

## 2024-04-26 NOTE — Therapy (Signed)
 OUTPATIENT SPEECH LANGUAGE PATHOLOGY VOICE TREATMENT   Patient Name: Terry Shannon MRN: [id number] DOB:[birth year], 68 y.o., female Today's Date: 04/26/2024  PCP: Theo Iha, MD REFERRING PROVIDER: Mila Lao, DO  END OF SESSION:  End of Session - 04/26/24 1243     Visit Number 6    Number of Visits 17    Date for Recertification  06/02/24    SLP Start Time 0849    SLP Stop Time  0930    SLP Time Calculation (min) 41 min    Activity Tolerance Patient tolerated treatment well              Past Medical History:  Diagnosis Date   Allergy     year-round, pt. 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   right   Complication of anesthesia    slow to wake up after gallbladder surgery   Depression    Eczema    ARMS AND HANDS   GERD (gastroesophageal reflux disease)    History of thyroid  cancer    Hypothyroidism    Migraines    Osteoarthritis    Overactive bladder    RA (rheumatoid arthritis) (HCC)    Sleep apnea    no CPAP use   Squamous cell carcinoma of skin 12/21/2017   in situ-mid chest ([id number])   Ulcerative colitis (HCC)    Past Surgical History:  Procedure Laterality Date   ABDOMINAL HERNIA REPAIR  03/13/2008   periumbilical ventral hernia/incisional hernia   BUNIONECTOMY Right    x 2 more   BUNIONECTOMY Left    BUNIONECTOMY WITH CHILECTOMY Right 12/16/2005   CARPAL TUNNEL RELEASE Right 07/13/2001   CARPAL TUNNEL RELEASE Left 08/10/2001   DILATION AND CURETTAGE OF UTERUS     HAMMER TOE SURGERY Left may 2014   HYSTEROSCOPY WITH D & C  03/01/2004   with exc. of endometrial polyp   KNEE ARTHROSCOPY Right 2013   LAPAROSCOPIC CHOLECYSTECTOMY  11/03/2006   LUMBAR FUSION  03/06/2014   l4  l5    nerve ablation lumbar      THYROIDECTOMY, PARTIAL Right prior to 2002   THYROIDECTOMY, PARTIAL Left 11/29/2000   and isthmus   TONSILLECTOMY  1961   TOTAL ANKLE REPLACEMENT Left 11/2016   with tendon repair   TOTAL KNEE  ARTHROPLASTY Right 04/01/2013   Procedure: RIGHT TOTAL KNEE ARTHROPLASTY;  Surgeon: [name], MD;  Location: [facility] OR;  Service: Orthopedics;  Laterality: Right;   Patient Active Problem List   Diagnosis Date Noted   Asthmatic bronchitis, mild persistent, uncomplicated 01/27/2022   Upper airway cough syndrome 01/27/2022   OSA on CPAP 04/27/2021   Intolerance of continuous positive airway pressure (CPAP) ventilation 06/17/2020   OSA (obstructive sleep apnea) 05/06/2020   Snoring 05/06/2020   Insomnia secondary to chronic pain 03/24/2020   History of sleep apnea 03/24/2020   Gasping for breath 03/24/2020   Abnormal glucose level 12/16/2019   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   Degeneration of lumbar intervertebral disc 12/16/2019   Hyperlipidemia 12/16/2019   Hypothyroidism 12/16/2019   Insomnia 12/16/2019   Migraine 12/16/2019   Overweight 12/16/2019   Postoperative hypothyroidism 12/16/2019   Vitamin D  deficiency 12/16/2019   Hallux rigidus, right foot 12/05/2019   Hammer toe of right foot 12/05/2019   History of left ankle joint replacement 12/05/2019   Aftercare following ankle joint replacement surgery 01/10/2017   Anxiety, mild 12/12/2016   Atypical chest pain 12/12/2016   Cardiac  murmur 12/12/2016   GERD (gastroesophageal reflux disease) 12/12/2016   History of thyroid  cancer 12/12/2016   Post-traumatic osteoarthritis of left ankle 10/03/2016   Chronic migraine without aura, with intractable migraine, so stated, with status migrainosus 09/26/2015   Joint pain 02/18/2015   Abnormal C-reactive protein 01/08/2015   Radiculopathy 03/06/2014   Osteoarthritis of right knee 04/01/2013   OA (osteoarthritis) 11/01/2011   OAB (overactive bladder)    Ulcerative colitis (HCC)    Cancer (HCC)    Carpal tunnel syndrome    RA (rheumatoid arthritis) (HCC)     Onset date: 2-3 years ago  REFERRING DIAG: R05.3 (ICD-10-CM) - Chronic cough     THERAPY DIAG:  Other voice  and resonance disorders  Rationale for Evaluation and Treatment: Rehabilitation  SUBJECTIVE:   SUBJECTIVE STATEMENT: Arrives again today with harsh strained-sounding voice, slightly better voice quality than previous session.  Pt accompanied by: self  PERTINENT HISTORY:  From neurology note 01/17/24: Terry Shannon is a 68 y.o. female with a history of migraine headaches without aura. Returns today for follow-up.  Overall she feels that her migraines have remained relatively stable.  She does state that in the last couple weeks they have increased in frequency but her next infusion of Vyepti  is due September 4.  She uses sumatriptan  hand with good benefit.  States that she rarely has to take a second dose.  She reports in regards to her CPAP there are some nights she falls asleep without putting it on and then wakes up later and puts it on.    PAIN:  Are you having pain? Yes: NPRS scale: 3/10 Pain location: rt throat Pain description: ache Aggravating factors: nothing Relieving factors: nothing  FALLS: Has patient fallen in last 6 months? No,    PATIENT GOALS: get rid of this hoarseness  OBJECTIVE:  Note: Objective measures were completed at Evaluation unless otherwise noted.  DIAGNOSTIC FINDINGS:  ENT- D[facility] 04/10/24: Flexible Fiberoptic Laryngoscopy Procedure Note Date of procedure 04/10/2024 Pre-Op Diagnosis: chronic cough                           Post Op Diagnosis: same Procedure: Flexible Fiberoptic Laryngoscopy CPT 31575 - Mod 25 Surgeon: Adah Malkin, D.O. Anesthesia: 4% lidocaine  with afrin Findings:  - mucopurulent drainage noted along the posterior pharyngeal wall with some pooling in the vallecula - bilateral vocal cord motion - interval improvement in vocal cord atrophy Procedure Detail: After verbal consent was obtained from the patient, the patient was brought in an upright position, a fiberoptic nasal laryngoscope was then passed into the patient right nasal  passage and left nasal passage, the left appeared to be more patent. It was passed along the floor of the nasal cavity to the nasopharynx. Torus tubarius was patent and the Fossa of Rosenmller was identified. The scope was then flexed caudally and advanced slowly through the nasopharynx, passed through the oropharynx, and down into the hypopharynx. The patient's oro- and nasopharynx were noted to have mucopurulent drainage.   The base of tongue was visualized and no mass, ulceration or lesion was appreciated. The epiglottis did not demonstrate any mass, ulceration or lesion. The vallecula was also assessed with no mass, ulceration or lesion. The patient had good glottic closure upon phonation and no signs of aspiration or pooling of secretions. 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 The patient was asked to inspire and expire with the  true vocal folds vibrating normally and without evidence of vocal fold dysfunction. The true and false vocal cords, interarytenoid, AE folds, and arytenoids did not demonstrate any significant edema or erythema. The patient was then asked to valsalva, and the pyriform sinuses were assessed which were unremarkable. The airway was patent and there was no evidence of compromise. The scope was then slowly withdrawn from the patient. The patient tolerated the procedure well and there were no complications.  Disposition: Stable   Impression & Plans:   Assessment and Plan Assessment & Plan Chronic cough with mucus production and possible upper respiratory infection - Start doxycycline  100 mg BID x 7 days - Start Voice therapy - scheduled for tomorrow   Allergic rhinitis with nasal congestion Nasal congestion affecting CPAP use. Ipratropium bromide  caused epistaxis, Flonase  caused migraines. - Prescribed Xyzal  (levocetirizine) 5 mg at night. - Prescribed Nasonex  (mometasone  furoate) nasal spray once daily in the  morning. - Prescribed azelastine  nasal spray twice daily, in the morning and at night.   ENT - Dr. Mila 04/01/24 Flexible Fiberoptic Laryngoscopy Procedure Note Date of procedure 04/01/2024 Pre-Op Diagnosis: chronic cough                           Post Op Diagnosis: same Procedure: Flexible Fiberoptic Laryngoscopy CPT 31575 - Mod 25 Surgeon: Adah Mila, D.O. 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 It was passed along the floor of the nasal cavity to the nasopharynx. Torus tubarius was patent and the Fossa of Rosenmller was identified. The scope was then flexed caudally and advanced slowly through the nasopharynx, passed through the oropharynx, and down into the hypopharynx. The patient's oro- and nasopharynx were noted to have mucopurulent drainage.   The base of tongue was visualized and no mass, ulceration or lesion was appreciated. The epiglottis did not demonstrate any mass, ulceration or lesion. The vallecula was also assessed with no mass, ulceration or lesion. The patient had good glottic closure upon phonation and no signs of aspiration or pooling of secretions. 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 The patient was asked to inspire and expire with the true vocal folds vibrating normally and without evidence of vocal fold dysfunction. The true and false vocal cords, interarytenoid, AE folds, and arytenoids did not demonstrate any significant edema or erythema. The patient was then asked  to valsalva, and the pyriform sinuses were assessed which were unremarkable. The airway was patent and there was no evidence of compromise. The scope was then slowly withdrawn from the patient. The patient tolerated the procedure well and there were no complications.  Disposition: Stable   Impression & Plans:   Assessment and Plan Assessment & Plan Chronic cough with mucus production and possible upper respiratory infection - Start doxycycline  100 mg BID x 7 days - Start Voice therapy - scheduled for tomorrow   Allergic rhinitis with nasal congestion Nasal congestion affecting CPAP use. Ipratropium bromide  caused epistaxis, Flonase  caused migraines. - Prescribed Xyzal  (levocetirizine) 5 mg at night. - Prescribed Nasonex  (mometasone   furoate) nasal spray once daily in the morning. - Prescribed azelastine  nasal spray twice daily, in the morning and at night.  ENT - Dr. Mila 03/14/24 Flexible Fiberoptic Laryngoscopy Procedure Note Date of procedure 03/14/2024 Pre-Op Diagnosis: chronic cough                           Post Op Diagnosis: same Procedure: Flexible Fiberoptic Laryngoscopy CPT 31575 - Mod 25 Surgeon: Adah Mila, D.O. Anesthesia: 4% lidocaine  with afrin Findings:  - bilateral vocal cord atrophy - no masses or lesions Procedure Detail: After verbal consent was obtained from the patient, the patient was brought in an upright position, a fiberoptic nasal laryngoscope was then passed into the patient right nasal passage and left nasal passage, the left appeared to be more patent. It was passed along the floor of the nasal cavity to the nasopharynx. Torus tubarius was patent and the Fossa of Rosenmller was identified. The scope was then flexed caudally and advanced slowly through the nasopharynx, passed through the oropharynx, and down into the hypopharynx. The patient's oro- and nasopharynx were unremarkable with no signs of any gross lesions, edema, masses, or bleeding.  The base  of tongue was visualized and no mass, ulceration or lesion was appreciated. The epiglottis did not demonstrate any mass, ulceration or lesion. The vallecula was also assessed with no mass, ulceration or lesion. The patient had good glottic closure upon phonation and no signs of aspiration or pooling of secretions. The patient was asked to inspire and expire with the true vocal folds vibrating normally and without evidence of vocal fold dysfunction. Bilateral true vocal cords were noted to be thin and atrophic. The true and false vocal cords, interarytenoid, AE folds, and arytenoids did not demonstrate any significant edema or erythema. The patient was then asked to valsalva, and the pyriform sinuses were assessed which were unremarkable. The airway was patent and there was no evidence of compromise. The scope was then slowly withdrawn from the patient. The patient tolerated the procedure well and there were no complications.  Disposition: Stable   Impression & Plans: Terry Shannon is a 68 y.o. female with chronic cough and right ear fullness.   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  Discussed risks, benefits, alternatives of the procedure.   Vasomotor rhinitis - Start Atrovent  twice daily   Eustachian tube dysfunction - Right TM with nonfunctional ear tube noted on exam - Will plan to replace ear tube in OR  MBS 01-11-24 Clinical Impression: Clinical Impression: Patient presents with functional oropharyneal swallow ability. Her swallow is characterized by decreased timing of laryngeal closure/epiglottic closure/deflection resulting in laryngeal penetration of thin liquids. No aspiration noted despite sequential swallows.  Minimal oral retention with liquids  spilled into pharynx and were also tracely penetrated.  Pharyngeal swallow is strong without significant retention.  Barium tablet taken with thin easily transited through oropharynx.  Of note, following exam, pt coughed and reported backflow of cracker and liquids into mouth that she subsequently re-swallowed.  No evidence of oropharynegal dysfunction to account for her symptoms. She was noted to clear her throat prior to study and cough and clear her throat throughout and after testing.  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  Thanks for this consult of this most pleasant pt.  Pt's symptoms were not replicated during today's test.   Factors that may increase risk of adverse event in presence of aspiration Noe & Lianne 2021): No data recorded   Recommendations/Plan: Swallowing Evaluation Recommendations Swallowing Evaluation Recommendations Recommendations: PO diet PO Diet Recommendation: Regular; Thin liquids (Level 0) Liquid Administration via: Cup; Straw Medication Administration: Whole meds with liquid Supervision: Patient able to self-feed Swallowing strategies  : Slow rate; Small bites/sips (start intake with liquids) Postural changes: Position pt fully upright for meals; Stay upright 30-60 min after meals Oral care recommendations: Oral care BID (2x/day)   PATIENT REPORTED OUTCOME MEASURES (PROM): [place] Laryngeal Hypersensitivity Questionnaire: score of 14.1 where normal is >17.1. and lower scores indicate greater affect of cough interfering with pt's QoL. ,and Leiester Cough Questionnaire 66/133 with lower scores indicating a greater affect of cough interfering with pt's QoL.                                                                                                                             TREATMENT DATE:  Abdominal Breathing = AB  04/26/24:  4 Coughs today; pt  took water  mostly, as an alternative. Today she arrived with audible inhalation and exhalation. She has been working with AB and cough suppression/laryngeal control strategies at home - SLP strongly encouraged pt to do 15 minutes BID and focus on silent breasting. After 45 seconds of practice with AB her inhalation became silent but her exhalation remained audible albeit quieter than upon entrance into ST. SLP worked with pt with resonant voice therapy tasks /m/ and /u/ with feeling vibration - pt rated amount of vibration felt on scale 1/5 (1=none) and pt stated 1 to begin but by session end was at 3/5. Pt's voice became slightly stronger (had more depth and less breathy) after this practice.   04/24/24: Pt was sleeping and felt like she had something in her throat that wouldn't get coughed up or swallowed down. O2 sat decr'd to 93 SpO2, and she ate and it got pushed down.  Audible respiration heard as pt entered ST room. Pt with 2 throat clears and 4 coughs prior to focus on AB. One cough 40 seconds into AB practice at rest. 8 minutes 15 seconds later, no coughs or throat clears were heard as pt cont'd to feel AB. Audible respirations for 4 minutes before pt reminded pt of relaxation strategies and at 6 minutes 35 seconds respirations were silent. 5 throat clears/coughs after this time. SLP strongly reiterated to pt she MUST practice AB 15 minutes BID, and practice cough suppression and laryngeal control strategies throughout the day for  more success. SLP further educated pt why SLP suspected she was having audible respirations due to vocal fold tension; SLP reviewed how helpful imagery can be and suggested pt think of a few images that will assist her relaxation  of her laryngeal area. It IS tight there, pt stated during this practice with SLP for imagery. Pt respiration was silent when she left the ST room.  04/17/24: Terry Shannon worked on AB more than between previous appointment and two appointments ago but  still not at recommended level. Pt with very noisy breathing upon entry into ST room so SLP guided pt through working on AB and laryngeal relaxation techniques due to noisy breathing.This took 12 minutes to ultimately achieve close-to-silent breathing using visualizations and verbal cues, with explanations. Pt with 7 throat clear and 4 coughs today, with only 4 clears and one cough in the 30 minutes SLP worked with pt focusing on AB. SLP strongly encouraged pt to adhere to 15 minutes of practice BID and reiterated the rationale for this.   04/15/24: Terry Shannon did not practice AB since last session. She cleared throat 6 times and coughed twice in the first 5 minutes prior to focus of AB. SLP began guiding pt through AB practice, and relaxation strategies practice, along with cont'd training for techniques for laryngeal control; Pt coughed once and cleared throat 11 times in the next 35 minutes. SLP drew pt's attention to this and strongly encouraged her to practice AB 12-15 minutes BID.  04/10/24: Pt saw Dr. Mila at 1330 today, as she was feeling like she could not clear mucous out of throat and when she did she had difficulty breathing. Dr. Mila, per her note, noted mucopurulent drainage. Pt has been practicing cough suppression strategies since the eval. In the first 5 minutes she had 8 throat clears and 2 coughs. SLP introduced abdominal breathing with pt and she was successful quickly, at rest. SLP also introduced pt to visualizations for relaxation during this time. In the last 10 minutes In these 15 minutes pt had 7 throat clears and 2 coughs. SLP reviewed pt's cough suppression strategies with her during the next 10 minutes and pt had 7 throat clears and 2 coughs. SLP pointed out that when pt not focused on AB or relaxation strategies for 5 minutes she had identical frequency to 15 minutes of focus on AB and relaxation strategies. Pt's breathing was whisper-sounding for the first 20 minutes of the session  until SLP focused on cough suppression strategies with pt and then it was silent until session end when it became more noisy again.  Pt to practice AB 10-15 minutes BID, and work on quiet breathing with visulaizations.  04/03/24: SLP and pt talked about laryngeal control strategies and cough suppression strategies. Today these appeared to assist pt as she did not cough or throat clear > once during the 1-2 mintues while practicing.   PATIENT EDUCATION: Education details: see treatment date Person educated: Patient Education method: Explanation, Demonstration, Verbal cues, and Handouts Education comprehension: verbalized understanding, returned demonstration, verbal cues required, and needs further education  HOME EXERCISE PROGRAM: Cough suppression and laryngeal control strategies, relaxation strategies, AB  GOALS: Goals reviewed with patient? Yes  SHORT TERM GOALS: Target date: 04/30/24  Pt will demo AB at rest for 2 minutes without cough or throat clear Baseline: Goal status: met  2.  Pt will report use of a strategy to successfully suppress cough within 30 seconds between two sessions Baseline:  Goal status: INITIAL  3.  Pt will demo 4 strategies to suppress cough/control larynx in 3 sessions Baseline:  Goal status: INITIAL   LONG TERM GOALS: Target date: 06/02/24  Pt will demo AB with sentence responses 90% in 4 minutes without cough  or throat clear  Baseline:  Goal status: INITIAL  2.  Pt will demo AB with 4 minutes conversation without cough or throat clear Baseline:  Goal status: INITIAL  3.  Pt will report use of a strategy to successfully suppress cough within 30 seconds in or between three sessions, after 05/02/24 Baseline:  Goal status: INITIAL  4.  Pt will improve at least one PROM Baseline:  Goal status: INITIAL   ASSESSMENT:  CLINICAL IMPRESSION: Patient is a 68 y.o. F who was seen today for voice therapy due to chronic cough. 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 See treatment date above for today's date for further details on today's session. Today pt coughed 4 times. On day of eval pt cleared throat 44 times and coughed 7 times. When she was asked then if her cough interfered with her ability to communicate, she answered, It is right now.  OBJECTIVE IMPAIRMENTS: include voice disorder. These impairments are limiting patient from effectively communicating at home and in community. Factors affecting potential to achieve goals and functional outcome are severity of impairments.. Patient will benefit from skilled SLP services to address above impairments and improve overall function.  REHAB POTENTIAL: Good  PLAN:  SLP FREQUENCY: 1-2x/week  SLP DURATION: 8 weeks  PLANNED INTERVENTIONS: Internal/external aids, Functional tasks, SLP instruction and feedback, Compensatory strategies, Patient/family education, [phone] Treatment of speech (30 or 45 min) , and voice exercises, laryngeal control, and cough suppression strategies    Honestee Shannon, CCC-SLP 04/26/2024, 12:43 PM  Referring diagnosis:   R05.3 (ICD-10-CM) - Chronic cough     Treatment diagnosis (if different than referring diagnosis): other voice and resonance disorders ICD-10-CM: R49.8   Date Symptoms Began: 05/23/2021 # of Visits requested: 17  Time period for Authorization: 04/03/24 to 06/02/24  What was this (referring dx) caused by? []  Surgery []  Fall []  Ongoing issue []  Arthritis [x]  Other: ____voice disorder________  Laterality: []  Rt []  Lt [x]  Both  Functional Tool & Score: [place]   Check all possible CPT codes:     See Planned Interventions listed in the Plan section of the Evaluation.     If [employer]: Choose 10 or less codes  If [employer]: Modalities are not covered  If [employer]: Check allowed ICD code  combinations   If [facility] Plan or [employer]: Cognitive training not covered

## 2024-04-30 ENCOUNTER — Ambulatory Visit

## 2024-04-30 DIAGNOSIS — R498 Other voice and resonance disorders: Secondary | ICD-10-CM | POA: Diagnosis not present

## 2024-04-30 NOTE — Therapy (Unsigned)
 OUTPATIENT SPEECH LANGUAGE PATHOLOGY VOICE TREATMENT   Patient Name: Terry Shannon MRN: [id number] DOB:[birth year], 68 y.o., female Today's Date: 04/30/2024  PCP: Theo Iha, MD REFERRING PROVIDER: Mila Lao, DO  END OF SESSION:  End of Session - 04/30/24 1453     Visit Number 7    Number of Visits 17    Date for Recertification  06/02/24    SLP Start Time 1448    SLP Stop Time  1530    SLP Time Calculation (min) 42 min    Activity Tolerance Patient tolerated treatment well               Past Medical History:  Diagnosis Date   Allergy     year-round, pt. 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   right   Complication of anesthesia    slow to wake up after gallbladder surgery   Depression    Eczema    ARMS AND HANDS   GERD (gastroesophageal reflux disease)    History of thyroid  cancer    Hypothyroidism    Migraines    Osteoarthritis    Overactive bladder    RA (rheumatoid arthritis) (HCC)    Sleep apnea    no CPAP use   Squamous cell carcinoma of skin 12/21/2017   in situ-mid chest ([id number])   Ulcerative colitis (HCC)    Past Surgical History:  Procedure Laterality Date   ABDOMINAL HERNIA REPAIR  03/13/2008   periumbilical ventral hernia/incisional hernia   BUNIONECTOMY Right    x 2 more   BUNIONECTOMY Left    BUNIONECTOMY WITH CHILECTOMY Right 12/16/2005   CARPAL TUNNEL RELEASE Right 07/13/2001   CARPAL TUNNEL RELEASE Left 08/10/2001   DILATION AND CURETTAGE OF UTERUS     HAMMER TOE SURGERY Left may 2014   HYSTEROSCOPY WITH D & C  03/01/2004   with exc. of endometrial polyp   KNEE ARTHROSCOPY Right 2013   LAPAROSCOPIC CHOLECYSTECTOMY  11/03/2006   LUMBAR FUSION  03/06/2014   l4  l5    nerve ablation lumbar      THYROIDECTOMY, PARTIAL Right prior to 2002   THYROIDECTOMY, PARTIAL Left 11/29/2000   and isthmus   TONSILLECTOMY  1961   TOTAL ANKLE REPLACEMENT Left 11/2016   with tendon repair   TOTAL  KNEE ARTHROPLASTY Right 04/01/2013   Procedure: RIGHT TOTAL KNEE ARTHROPLASTY;  Surgeon: [name], MD;  Location: [facility] OR;  Service: Orthopedics;  Laterality: Right;   Patient Active Problem List   Diagnosis Date Noted   Asthmatic bronchitis, mild persistent, uncomplicated 01/27/2022   Upper airway cough syndrome 01/27/2022   OSA on CPAP 04/27/2021   Intolerance of continuous positive airway pressure (CPAP) ventilation 06/17/2020   OSA (obstructive sleep apnea) 05/06/2020   Snoring 05/06/2020   Insomnia secondary to chronic pain 03/24/2020   History of sleep apnea 03/24/2020   Gasping for breath 03/24/2020   Abnormal glucose level 12/16/2019   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   Degeneration of lumbar intervertebral disc 12/16/2019   Hyperlipidemia 12/16/2019   Hypothyroidism 12/16/2019   Insomnia 12/16/2019   Migraine 12/16/2019   Overweight 12/16/2019   Postoperative hypothyroidism 12/16/2019   Vitamin D  deficiency 12/16/2019   Hallux rigidus, right foot 12/05/2019   Hammer toe of right foot 12/05/2019   History of left ankle joint replacement 12/05/2019   Aftercare following ankle joint replacement surgery 01/10/2017   Anxiety, mild 12/12/2016   Atypical chest pain 12/12/2016  Cardiac murmur 12/12/2016   GERD (gastroesophageal reflux disease) 12/12/2016   History of thyroid  cancer 12/12/2016   Post-traumatic osteoarthritis of left ankle 10/03/2016   Chronic migraine without aura, with intractable migraine, so stated, with status migrainosus 09/26/2015   Joint pain 02/18/2015   Abnormal C-reactive protein 01/08/2015   Radiculopathy 03/06/2014   Osteoarthritis of right knee 04/01/2013   OA (osteoarthritis) 11/01/2011   OAB (overactive bladder)    Ulcerative colitis (HCC)    Cancer (HCC)    Carpal tunnel syndrome    RA (rheumatoid arthritis) (HCC)     Onset date: 2-3 years ago  REFERRING DIAG: R05.3 (ICD-10-CM) - Chronic cough     THERAPY DIAG:  No  diagnosis found.  Rationale for Evaluation and Treatment: Rehabilitation  SUBJECTIVE:   SUBJECTIVE STATEMENT: Arrives again today with harsh strained-sounding voice, slightly better voice quality than previous session.  Pt accompanied by: self  PERTINENT HISTORY:  From neurology note 01/17/24: Terry Shannon is a 68 y.o. female with a history of migraine headaches without aura. Returns today for follow-up.  Overall she feels that her migraines have remained relatively stable.  She does state that in the last couple weeks they have increased in frequency but her next infusion of Vyepti  is due September 4.  She uses sumatriptan  hand with good benefit.  States that she rarely has to take a second dose.  She reports in regards to her CPAP there are some nights she falls asleep without putting it on and then wakes up later and puts it on.    PAIN:  Are you having pain? Yes: NPRS scale: 3/10 Pain location: rt throat Pain description: ache Aggravating factors: nothing Relieving factors: nothing  FALLS: Has patient fallen in last 6 months? No,    PATIENT GOALS: get rid of this hoarseness  OBJECTIVE:  Note: Objective measures were completed at Evaluation unless otherwise noted.  DIAGNOSTIC FINDINGS:  ENT- Dhar[facility] 04/10/24: Flexible Fiberoptic Laryngoscopy Procedure Note Date of procedure 04/10/2024 Pre-Op Diagnosis: chronic cough                           Post Op Diagnosis: same Procedure: Flexible Fiberoptic Laryngoscopy CPT 31575 - Mod 25 Surgeon: Adah Malkin, D.O. Anesthesia: 4% lidocaine  with afrin Findings:  - mucopurulent drainage noted along the posterior pharyngeal wall with some pooling in the vallecula - bilateral vocal cord motion - interval improvement in vocal cord atrophy Procedure Detail: After verbal consent was obtained from the patient, the patient was brought in an upright position, a fiberoptic nasal laryngoscope was then passed into the patient right nasal  passage and left nasal passage, the left appeared to be more patent. It was passed along the floor of the nasal cavity to the nasopharynx. Torus tubarius was patent and the Fossa of Rosenmller was identified. The scope was then flexed caudally and advanced slowly through the nasopharynx, passed through the oropharynx, and down into the hypopharynx. The patient's oro- and nasopharynx were noted to have mucopurulent drainage.   The base of tongue was visualized and no mass, ulceration or lesion was appreciated. The epiglottis did not demonstrate any mass, ulceration or lesion. The vallecula was also assessed with no mass, ulceration or lesion. The patient had good glottic closure upon phonation and no signs of aspiration or pooling of secretions. 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 The patient was asked to inspire and expire with the true  vocal folds vibrating normally and without evidence of vocal fold dysfunction. The true and false vocal cords, interarytenoid, AE folds, and arytenoids did not demonstrate any significant edema or erythema. The patient was then asked to valsalva, and the pyriform sinuses were assessed which were unremarkable. The airway was patent and there was no evidence of compromise. The scope was then slowly withdrawn from the patient. The patient tolerated the procedure well and there were no complications.  Disposition: Stable   Impression & Plans:   Assessment and Plan Assessment & Plan Chronic cough with mucus production and possible upper respiratory infection - Start doxycycline  100 mg BID x 7 days - Start Voice therapy - scheduled for tomorrow   Allergic rhinitis with nasal congestion Nasal congestion affecting CPAP use. Ipratropium bromide  caused epistaxis, Flonase  caused migraines. - Prescribed Xyzal  (levocetirizine) 5 mg at night. - Prescribed Nasonex  (mometasone  furoate) nasal spray once daily in the  morning. - Prescribed azelastine  nasal spray twice daily, in the morning and at night.   ENT - Dr. Mila 04/01/24 Flexible Fiberoptic Laryngoscopy Procedure Note Date of procedure 04/01/2024 Pre-Op Diagnosis: chronic cough                           Post Op Diagnosis: same Procedure: Flexible Fiberoptic Laryngoscopy CPT 31575 - Mod 25 Surgeon: Adah Mila, D.O. 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 It was passed along the floor of the nasal cavity to the nasopharynx. Torus tubarius was patent and the Fossa of Rosenmller was identified. The scope was then flexed caudally and advanced slowly through the nasopharynx, passed through the oropharynx, and down into the hypopharynx. The patient's oro- and nasopharynx were noted to have mucopurulent drainage.   The base of tongue was visualized and no mass, ulceration or lesion was appreciated. The epiglottis did not demonstrate any mass, ulceration or lesion. The vallecula was also assessed with no mass, ulceration or lesion. The patient had good glottic closure upon phonation and no signs of aspiration or pooling of secretions. 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 The patient was asked to inspire and expire with the true vocal folds vibrating normally and without evidence of vocal fold dysfunction. The true and false vocal cords, interarytenoid, AE folds, and arytenoids did not demonstrate any significant edema or erythema. The patient was then asked  to valsalva, and the pyriform sinuses were assessed which were unremarkable. The airway was patent and there was no evidence of compromise. The scope was then slowly withdrawn from the patient. The patient tolerated the procedure well and there were no complications.  Disposition: Stable   Impression & Plans:   Assessment and Plan Assessment & Plan Chronic cough with mucus production and possible upper respiratory infection - Start doxycycline  100 mg BID x 7 days - Start Voice therapy - scheduled for tomorrow   Allergic rhinitis with nasal congestion Nasal congestion affecting CPAP use. Ipratropium bromide  caused epistaxis, Flonase  caused migraines. - Prescribed Xyzal  (levocetirizine) 5 mg at night. - Prescribed Nasonex  (mometasone  furoate)  nasal spray once daily in the morning. - Prescribed azelastine  nasal spray twice daily, in the morning and at night.  ENT - Dr. Mila 03/14/24 Flexible Fiberoptic Laryngoscopy Procedure Note Date of procedure 03/14/2024 Pre-Op Diagnosis: chronic cough                           Post Op Diagnosis: same Procedure: Flexible Fiberoptic Laryngoscopy CPT 31575 - Mod 25 Surgeon: Adah Mila, D.O. Anesthesia: 4% lidocaine  with afrin Findings:  - bilateral vocal cord atrophy - no masses or lesions Procedure Detail: After verbal consent was obtained from the patient, the patient was brought in an upright position, a fiberoptic nasal laryngoscope was then passed into the patient right nasal passage and left nasal passage, the left appeared to be more patent. It was passed along the floor of the nasal cavity to the nasopharynx. Torus tubarius was patent and the Fossa of Rosenmller was identified. The scope was then flexed caudally and advanced slowly through the nasopharynx, passed through the oropharynx, and down into the hypopharynx. The patient's oro- and nasopharynx were unremarkable with no signs of any gross lesions, edema, masses, or bleeding.  The base  of tongue was visualized and no mass, ulceration or lesion was appreciated. The epiglottis did not demonstrate any mass, ulceration or lesion. The vallecula was also assessed with no mass, ulceration or lesion. The patient had good glottic closure upon phonation and no signs of aspiration or pooling of secretions. The patient was asked to inspire and expire with the true vocal folds vibrating normally and without evidence of vocal fold dysfunction. Bilateral true vocal cords were noted to be thin and atrophic. The true and false vocal cords, interarytenoid, AE folds, and arytenoids did not demonstrate any significant edema or erythema. The patient was then asked to valsalva, and the pyriform sinuses were assessed which were unremarkable. The airway was patent and there was no evidence of compromise. The scope was then slowly withdrawn from the patient. The patient tolerated the procedure well and there were no complications.  Disposition: Stable   Impression & Plans: Terry Shannon is a 68 y.o. female with chronic cough and right ear fullness.   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  Discussed risks, benefits, alternatives of the procedure.   Vasomotor rhinitis - Start Atrovent  twice daily   Eustachian tube dysfunction - Right TM with nonfunctional ear tube noted on exam - Will plan to replace ear tube in OR  MBS 01-11-24 Clinical Impression: Clinical Impression: Patient presents with functional oropharyneal swallow ability. Her swallow is characterized by decreased timing of laryngeal closure/epiglottic closure/deflection resulting in laryngeal penetration of thin liquids. No aspiration noted despite sequential swallows.  Minimal oral retention with liquids  spilled into pharynx and were also tracely penetrated.  Pharyngeal swallow is strong without significant retention.  Barium tablet taken with thin easily transited through oropharynx.  Of note, following exam, pt coughed and reported backflow of cracker and liquids into mouth that she subsequently re-swallowed.  No evidence of oropharynegal dysfunction to account for her symptoms. She was noted to clear her throat prior to study and cough and clear her throat throughout and after testing.  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  Thanks for this consult of this most pleasant pt.  Pt's symptoms were not replicated during today's test.   Factors that may increase risk of adverse event in presence of aspiration Noe & Lianne 2021): No data recorded   Recommendations/Plan: Swallowing Evaluation Recommendations Swallowing Evaluation Recommendations Recommendations: PO diet PO Diet Recommendation: Regular; Thin liquids (Level 0) Liquid Administration via: Cup; Straw Medication Administration: Whole meds with liquid Supervision: Patient able to self-feed Swallowing strategies  : Slow rate; Small bites/sips (start intake with liquids) Postural changes: Position pt fully upright for meals; Stay upright 30-60 min after meals Oral care recommendations: Oral care BID (2x/day)   PATIENT REPORTED OUTCOME MEASURES (PROM): [place] Laryngeal Hypersensitivity Questionnaire: score of 14.1 where normal is >17.1. and lower scores indicate greater affect of cough interfering with pt's QoL. ,and Leiester Cough Questionnaire 66/133 with lower scores indicating a greater affect of cough interfering with pt's QoL.                                                                                                                             TREATMENT DATE:  Abdominal Breathing = AB  04/30/24: 3 coughs in first 3  minutes prior to focus on AB and release of tension. Pt endorsed tightness and tension in laryngeal area upon entry to ST room. After 4 minutes pt cont'd with audible respiration. One cough in today's 17 minutes of focus on AB. Pt's audible respiration decr'd by approx 50% with SLP cues for pt to focus on visualization of relaxation of laryngeal area.   04/26/24:  4 Coughs today; pt took water  mostly, as an alternative. Today she arrived with audible inhalation and exhalation. She has been working with AB and cough suppression/laryngeal control strategies at home - SLP strongly encouraged pt to do 15 minutes BID and focus on silent breasting. After 45 seconds of practice with AB her inhalation became silent but her exhalation remained audible albeit quieter than upon entrance into ST. SLP worked with pt with resonant voice therapy tasks /m/ and /u/ with feeling vibration - pt rated amount of vibration felt on scale 1/5 (1=none) and pt stated 1 to begin but by session end was at 3/5. Pt's voice became slightly stronger (had more depth and less breathy) after this practice.   04/24/24: Pt was sleeping and felt like she had something in her throat that wouldn't get coughed up or swallowed down. O2 sat decr'd to 93 SpO2, and she ate and it got pushed down.  Audible respiration heard as pt entered ST room. Pt with 2 throat clears and 4 coughs prior to focus on AB. One cough 40 seconds into AB practice at rest. 8 minutes 15 seconds later, no coughs or throat clears were heard as pt cont'd to feel AB. Audible respirations for 4 minutes before pt reminded pt of relaxation strategies and at 6 minutes 35 seconds respirations were silent. 5  throat clears/coughs after this time. SLP strongly reiterated to pt she MUST practice AB 15 minutes BID, and practice cough suppression and laryngeal control strategies throughout the day for  more success. SLP further educated pt why SLP suspected she was having audible respirations  due to vocal fold tension; SLP reviewed how helpful imagery can be and suggested pt think of a few images that will assist her relaxation of her laryngeal area. It IS tight there, pt stated during this practice with SLP for imagery. Pt respiration was silent when she left the ST room.  04/17/24: Terry Shannon worked on AB more than between previous appointment and two appointments ago but still not at recommended level. Pt with very noisy breathing upon entry into ST room so SLP guided pt through working on AB and laryngeal relaxation techniques due to noisy breathing.This took 12 minutes to ultimately achieve close-to-silent breathing using visualizations and verbal cues, with explanations. Pt with 7 throat clear and 4 coughs today, with only 4 clears and one cough in the 30 minutes SLP worked with pt focusing on AB. SLP strongly encouraged pt to adhere to 15 minutes of practice BID and reiterated the rationale for this.   04/15/24: Terry Shannon did not practice AB since last session. She cleared throat 6 times and coughed twice in the first 5 minutes prior to focus of AB. SLP began guiding pt through AB practice, and relaxation strategies practice, along with cont'd training for techniques for laryngeal control; Pt coughed once and cleared throat 11 times in the next 35 minutes. SLP drew pt's attention to this and strongly encouraged her to practice AB 12-15 minutes BID.  04/10/24: Pt saw Dr. Mila at 1330 today, as she was feeling like she could not clear mucous out of throat and when she did she had difficulty breathing. Dr. Mila, per her note, noted mucopurulent drainage. Pt has been practicing cough suppression strategies since the eval. In the first 5 minutes she had 8 throat clears and 2 coughs. SLP introduced abdominal breathing with pt and she was successful quickly, at rest. SLP also introduced pt to visualizations for relaxation during this time. In the last 10 minutes In these 15 minutes pt had 7 throat  clears and 2 coughs. SLP reviewed pt's cough suppression strategies with her during the next 10 minutes and pt had 7 throat clears and 2 coughs. SLP pointed out that when pt not focused on AB or relaxation strategies for 5 minutes she had identical frequency to 15 minutes of focus on AB and relaxation strategies. Pt's breathing was whisper-sounding for the first 20 minutes of the session until SLP focused on cough suppression strategies with pt and then it was silent until session end when it became more noisy again.  Pt to practice AB 10-15 minutes BID, and work on quiet breathing with visulaizations.  04/03/24: SLP and pt talked about laryngeal control strategies and cough suppression strategies. Today these appeared to assist pt as she did not cough or throat clear > once during the 1-2 mintues while practicing.   PATIENT EDUCATION: Education details: see treatment date Person educated: Patient Education method: Explanation, Demonstration, Verbal cues, and Handouts Education comprehension: verbalized understanding, returned demonstration, verbal cues required, and needs further education  HOME EXERCISE PROGRAM: Cough suppression and laryngeal control strategies, relaxation strategies, AB  GOALS: Goals reviewed with patient? Yes  SHORT TERM GOALS: Target date: 04/30/24  Pt will demo AB at rest for 2 minutes without cough or throat clear Baseline: Goal  status: met  2.  Pt will report use of a strategy to successfully suppress cough within 30 seconds between two sessions Baseline:  Goal status: partially met  3.  Pt will demo 4 strategies to suppress cough/control larynx in 3 sessions Baseline:  Goal status: partially met   LONG TERM GOALS: Target date: 06/02/24  Pt will demo AB with sentence responses 90% in 4 minutes without cough or throat clear  Baseline:  Goal status: INITIAL  2.  Pt will demo AB with 4 minutes conversation without cough or throat clear Baseline:  Goal  status: INITIAL  3.  Pt will report use of a strategy to successfully suppress cough within 30 seconds in or between three sessions, after 05/02/24 Baseline:  Goal status: INITIAL  4.  Pt will improve at least one PROM Baseline:  Goal status: INITIAL   ASSESSMENT:  CLINICAL IMPRESSION: Patient is a 68 y.o. F who was seen today for voice therapy due to chronic cough. 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 See treatment date above for today's date for further details on today's session. Today pt coughed 4 times. On day of eval pt cleared throat 44 times and coughed 7 times. When she was asked then if her cough interfered with her ability to communicate, she answered, It is right now.  OBJECTIVE IMPAIRMENTS: include voice disorder. These impairments are limiting patient from effectively communicating at home and in community. Factors affecting potential to achieve goals and functional outcome are severity of impairments.. Patient will benefit from skilled SLP services to address above impairments and improve overall function.  REHAB POTENTIAL: Good  PLAN:  SLP FREQUENCY: 1-2x/week  SLP DURATION: 8 weeks  PLANNED INTERVENTIONS: Internal/external aids, Functional tasks, SLP instruction and feedback, Compensatory strategies, Patient/family education, [phone] Treatment of speech (30 or 45 min) , and voice exercises, laryngeal control, and cough suppression strategies    Kentrell Hallahan, CCC-SLP 04/30/2024, 2:53 PM  Referring diagnosis:   R05.3 (ICD-10-CM) - Chronic cough     Treatment diagnosis (if different than referring diagnosis): other voice and resonance disorders ICD-10-CM: R49.8   Date Symptoms Began: 05/23/2021 # of Visits requested: 17  Time period for Authorization: 04/03/24 to 06/02/24  What was this (referring dx) caused by? []  Surgery []  Fall []   Ongoing issue []  Arthritis [x]  Other: ____voice disorder________  Laterality: []  Rt []  Lt [x]  Both  Functional Tool & Score: [place]   Check all possible CPT codes:     See Planned Interventions listed in the Plan section of the Evaluation.     If [employer]: Choose 10 or less codes  If [employer]: Modalities are not covered  If [employer]: Check allowed ICD code combinations   If [facility] Plan or [employer]: Cognitive training not covered

## 2024-05-01 DIAGNOSIS — M0579 Rheumatoid arthritis with rheumatoid factor of multiple sites without organ or systems involvement: Secondary | ICD-10-CM | POA: Diagnosis not present

## 2024-05-06 NOTE — Therapy (Signed)
 OUTPATIENT SPEECH LANGUAGE PATHOLOGY VOICE TREATMENT   Patient Name: Terry Shannon MRN: [id number] DOB:[birth year], 68 y.o., female Today's Date: 05/07/2024  PCP: Theo Iha, MD REFERRING PROVIDER: Mila Lao, DO  END OF SESSION:  End of Session - 05/07/24 1532     Visit Number 8    Number of Visits 17    Date for Recertification  06/02/24    SLP Start Time 1441    SLP Stop Time  1526    SLP Time Calculation (min) 45 min    Activity Tolerance Patient tolerated treatment well                Past Medical History:  Diagnosis Date   Allergy     year-round, pt. 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   right   Complication of anesthesia    slow to wake up after gallbladder surgery   Depression    Eczema    ARMS AND HANDS   GERD (gastroesophageal reflux disease)    History of thyroid  cancer    Hypothyroidism    Migraines    Osteoarthritis    Overactive bladder    RA (rheumatoid arthritis) (HCC)    Sleep apnea    no CPAP use   Squamous cell carcinoma of skin 12/21/2017   in situ-mid chest ([id number])   Ulcerative colitis (HCC)    Past Surgical History:  Procedure Laterality Date   ABDOMINAL HERNIA REPAIR  03/13/2008   periumbilical ventral hernia/incisional hernia   BUNIONECTOMY Right    x 2 more   BUNIONECTOMY Left    BUNIONECTOMY WITH CHILECTOMY Right 12/16/2005   CARPAL TUNNEL RELEASE Right 07/13/2001   CARPAL TUNNEL RELEASE Left 08/10/2001   DILATION AND CURETTAGE OF UTERUS     HAMMER TOE SURGERY Left may 2014   HYSTEROSCOPY WITH D & C  03/01/2004   with exc. of endometrial polyp   KNEE ARTHROSCOPY Right 2013   LAPAROSCOPIC CHOLECYSTECTOMY  11/03/2006   LUMBAR FUSION  03/06/2014   l4  l5    nerve ablation lumbar      THYROIDECTOMY, PARTIAL Right prior to 2002   THYROIDECTOMY, PARTIAL Left 11/29/2000   and isthmus   TONSILLECTOMY  1961   TOTAL ANKLE REPLACEMENT Left 11/2016   with tendon repair   TOTAL  KNEE ARTHROPLASTY Right 04/01/2013   Procedure: RIGHT TOTAL KNEE ARTHROPLASTY;  Surgeon: [name], MD;  Location: [facility] OR;  Service: Orthopedics;  Laterality: Right;   Patient Active Problem List   Diagnosis Date Noted   Asthmatic bronchitis, mild persistent, uncomplicated 01/27/2022   Upper airway cough syndrome 01/27/2022   OSA on CPAP 04/27/2021   Intolerance of continuous positive airway pressure (CPAP) ventilation 06/17/2020   OSA (obstructive sleep apnea) 05/06/2020   Snoring 05/06/2020   Insomnia secondary to chronic pain 03/24/2020   History of sleep apnea 03/24/2020   Gasping for breath 03/24/2020   Abnormal glucose level 12/16/2019   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   Degeneration of lumbar intervertebral disc 12/16/2019   Hyperlipidemia 12/16/2019   Hypothyroidism 12/16/2019   Insomnia 12/16/2019   Migraine 12/16/2019   Overweight 12/16/2019   Postoperative hypothyroidism 12/16/2019   Vitamin D  deficiency 12/16/2019   Hallux rigidus, right foot 12/05/2019   Hammer toe of right foot 12/05/2019   History of left ankle joint replacement 12/05/2019   Aftercare following ankle joint replacement surgery 01/10/2017   Anxiety, mild 12/12/2016   Atypical chest pain 12/12/2016  Cardiac murmur 12/12/2016   GERD (gastroesophageal reflux disease) 12/12/2016   History of thyroid  cancer 12/12/2016   Post-traumatic osteoarthritis of left ankle 10/03/2016   Chronic migraine without aura, with intractable migraine, so stated, with status migrainosus 09/26/2015   Joint pain 02/18/2015   Abnormal C-reactive protein 01/08/2015   Radiculopathy 03/06/2014   Osteoarthritis of right knee 04/01/2013   OA (osteoarthritis) 11/01/2011   OAB (overactive bladder)    Ulcerative colitis (HCC)    Cancer (HCC)    Carpal tunnel syndrome    RA (rheumatoid arthritis) (HCC)     Onset date: 2-3 years ago  REFERRING DIAG: R05.3 (ICD-10-CM) - Chronic cough     THERAPY DIAG:  Other  voice and resonance disorders  Dysphagia, unspecified type  Rationale for Evaluation and Treatment: Rehabilitation  SUBJECTIVE:   SUBJECTIVE STATEMENT: Arrives today with 100% voicing - harsh and slightly breathy, and no aphonia.  Pt accompanied by: self  PERTINENT HISTORY:  From neurology note 01/17/24: Terry Shannon is a 68 y.o. female with a history of migraine headaches without aura. Returns today for follow-up.  Overall she feels that her migraines have remained relatively stable.  She does state that in the last couple weeks they have increased in frequency but her next infusion of Vyepti  is due September 4.  She uses sumatriptan  hand with good benefit.  States that she rarely has to take a second dose.  She reports in regards to her CPAP there are some nights she falls asleep without putting it on and then wakes up later and puts it on.    PAIN:  Are you having pain? Yes: NPRS scale: 3/10 Pain location: rt throat Pain description: ache, deep Aggravating factors: nothing Relieving factors: nothing  FALLS: Has patient fallen in last 6 months? No,    PATIENT GOALS: get rid of this hoarseness  OBJECTIVE:  Note: Objective measures were completed at Evaluation unless otherwise noted.  DIAGNOSTIC FINDINGS:  ENT- [facility] 04/10/24: Flexible Fiberoptic Laryngoscopy Procedure Note Date of procedure 04/10/2024 Pre-Op Diagnosis: chronic cough                           Post Op Diagnosis: same Procedure: Flexible Fiberoptic Laryngoscopy CPT 31575 - Mod 25 Surgeon: Adah Malkin, D.O. Anesthesia: 4% lidocaine  with afrin Findings:  - mucopurulent drainage noted along the posterior pharyngeal wall with some pooling in the vallecula - bilateral vocal cord motion - interval improvement in vocal cord atrophy Procedure Detail: After verbal consent was obtained from the patient, the patient was brought in an upright position, a fiberoptic nasal laryngoscope was then passed into the patient  right nasal passage and left nasal passage, the left appeared to be more patent. It was passed along the floor of the nasal cavity to the nasopharynx. Torus tubarius was patent and the Fossa of Rosenmller was identified. The scope was then flexed caudally and advanced slowly through the nasopharynx, passed through the oropharynx, and down into the hypopharynx. The patient's oro- and nasopharynx were noted to have mucopurulent drainage.   The base of tongue was visualized and no mass, ulceration or lesion was appreciated. The epiglottis did not demonstrate any mass, ulceration or lesion. The vallecula was also assessed with no mass, ulceration or lesion. The patient had good glottic closure upon phonation and no signs of aspiration or pooling of secretions. 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 The patient was asked to  inspire and expire with the true vocal folds vibrating normally and without evidence of vocal fold dysfunction. The true and false vocal cords, interarytenoid, AE folds, and arytenoids did not demonstrate any significant edema or erythema. The patient was then asked to valsalva, and the pyriform sinuses were assessed which were unremarkable. The airway was patent and there was no evidence of compromise. The scope was then slowly withdrawn from the patient. The patient tolerated the procedure well and there were no complications.  Disposition: Stable   Impression & Plans:   Assessment and Plan Assessment & Plan Chronic cough with mucus production and possible upper respiratory infection - Start doxycycline  100 mg BID x 7 days - Start Voice therapy - scheduled for tomorrow   Allergic rhinitis with nasal congestion Nasal congestion affecting CPAP use. Ipratropium bromide  caused epistaxis, Flonase  caused migraines. - Prescribed Xyzal  (levocetirizine) 5 mg at night. - Prescribed Nasonex  (mometasone  furoate) nasal spray once daily  in the morning. - Prescribed azelastine  nasal spray twice daily, in the morning and at night.   ENT - Dr. Mila 04/01/24 Flexible Fiberoptic Laryngoscopy Procedure Note Date of procedure 04/01/2024 Pre-Op Diagnosis: chronic cough                           Post Op Diagnosis: same Procedure: Flexible Fiberoptic Laryngoscopy CPT 31575 - Mod 25 Surgeon: Adah Mila, D.O. 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 It was passed along the floor of the nasal cavity to the nasopharynx. Torus tubarius was patent and the Fossa of Rosenmller was identified. The scope was then flexed caudally and advanced slowly through the nasopharynx, passed through the oropharynx, and down into the hypopharynx. The patient's oro- and nasopharynx were noted to have mucopurulent drainage.   The base of tongue was visualized and no mass, ulceration or lesion was appreciated. The epiglottis did not demonstrate any mass, ulceration or lesion. The vallecula was also assessed with no mass, ulceration or lesion. The patient had good glottic closure upon phonation and no signs of aspiration or pooling of secretions. 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 The patient was asked to inspire and expire with the true vocal folds vibrating normally and without evidence of vocal fold dysfunction. The true and false vocal cords, interarytenoid, AE folds, and arytenoids did not demonstrate any significant edema or erythema. The patient was then  asked to valsalva, and the pyriform sinuses were assessed which were unremarkable. The airway was patent and there was no evidence of compromise. The scope was then slowly withdrawn from the patient. The patient tolerated the procedure well and there were no complications.  Disposition: Stable   Impression & Plans:   Assessment and Plan Assessment & Plan Chronic cough with mucus production and possible upper respiratory infection - Start doxycycline  100 mg BID x 7 days - Start Voice therapy - scheduled for tomorrow   Allergic rhinitis with nasal congestion Nasal congestion affecting CPAP use. Ipratropium bromide  caused epistaxis, Flonase  caused migraines. - Prescribed Xyzal  (levocetirizine) 5 mg at  night. - Prescribed Nasonex  (mometasone  furoate) nasal spray once daily in the morning. - Prescribed azelastine  nasal spray twice daily, in the morning and at night.  ENT - Dr. Mila 03/14/24 Flexible Fiberoptic Laryngoscopy Procedure Note Date of procedure 03/14/2024 Pre-Op Diagnosis: chronic cough                           Post Op Diagnosis: same Procedure: Flexible Fiberoptic Laryngoscopy CPT 31575 - Mod 25 Surgeon: Adah Mila, D.O. Anesthesia: 4% lidocaine  with afrin Findings:  - bilateral vocal cord atrophy - no masses or lesions Procedure Detail: After verbal consent was obtained from the patient, the patient was brought in an upright position, a fiberoptic nasal laryngoscope was then passed into the patient right nasal passage and left nasal passage, the left appeared to be more patent. It was passed along the floor of the nasal cavity to the nasopharynx. Torus tubarius was patent and the Fossa of Rosenmller was identified. The scope was then flexed caudally and advanced slowly through the nasopharynx, passed through the oropharynx, and down into the hypopharynx. The patient's oro- and nasopharynx were unremarkable with no signs of any gross lesions, edema, masses, or  bleeding.  The base of tongue was visualized and no mass, ulceration or lesion was appreciated. The epiglottis did not demonstrate any mass, ulceration or lesion. The vallecula was also assessed with no mass, ulceration or lesion. The patient had good glottic closure upon phonation and no signs of aspiration or pooling of secretions. The patient was asked to inspire and expire with the true vocal folds vibrating normally and without evidence of vocal fold dysfunction. Bilateral true vocal cords were noted to be thin and atrophic. The true and false vocal cords, interarytenoid, AE folds, and arytenoids did not demonstrate any significant edema or erythema. The patient was then asked to valsalva, and the pyriform sinuses were assessed which were unremarkable. The airway was patent and there was no evidence of compromise. The scope was then slowly withdrawn from the patient. The patient tolerated the procedure well and there were no complications.  Disposition: Stable   Impression & Plans: Terry Shannon is a 68 y.o. female with chronic cough and right ear fullness.   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  Discussed risks, benefits, alternatives of the procedure.   Vasomotor rhinitis - Start Atrovent  twice daily   Eustachian tube dysfunction - Right TM with nonfunctional ear tube noted on exam - Will plan to replace ear tube in OR  MBS 01-11-24 Clinical Impression: Clinical Impression: Patient presents with functional oropharyneal swallow ability. Her swallow is characterized by decreased timing of laryngeal closure/epiglottic closure/deflection resulting in laryngeal penetration of thin liquids. No aspiration noted despite sequential swallows.  Minimal oral  retention with liquids spilled into pharynx and were also tracely penetrated.  Pharyngeal swallow is strong without significant retention.  Barium tablet taken with thin easily transited through oropharynx.  Of note, following exam, pt coughed and reported backflow of cracker and liquids into mouth that she subsequently re-swallowed.  No evidence of oropharynegal dysfunction to account for her symptoms. She was noted to clear her throat prior to study and cough and clear her throat throughout and after testing.  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  Thanks for this consult of this most pleasant pt.  Pt's symptoms were not replicated during today's test.   Factors that may increase risk of adverse event in presence of aspiration Noe & Lianne 2021): No data recorded   Recommendations/Plan: Swallowing Evaluation Recommendations Swallowing Evaluation Recommendations Recommendations: PO diet PO Diet Recommendation: Regular; Thin liquids (Level 0) Liquid Administration via: Cup; Straw Medication Administration: Whole meds with liquid Supervision: Patient able to self-feed Swallowing strategies  : Slow rate; Small bites/sips (start intake with liquids) Postural changes: Position pt fully upright for meals; Stay upright 30-60 min after meals Oral care recommendations: Oral care BID (2x/day)   PATIENT REPORTED OUTCOME MEASURES (PROM): [place] Laryngeal Hypersensitivity Questionnaire: score of 14.1 where normal is >17.1. and lower scores indicate greater affect of cough interfering with pt's QoL. ,and Leiester Cough Questionnaire 66/133 with lower scores indicating a greater affect of cough interfering with pt's QoL.                                                                                                                             TREATMENT DATE:  Abdominal Breathing = AB, Resonant  Voice Therapy=RVT; Semi Occluded Vocal Tract Exercises - SOVTE  05/07/24: 5 coughs in first 21 minutes, talking in conversation with SLP for that entire time. Pt with only 3 coughs when working with tongue trills for 16 minutes.Total 12 coughs and 1 throat clear today. Pt with breathy, harsh voice today but no aphonia. Has been doing AB for 15 minutes BID as well as laryngeal control strategies for 5 minutes 3-4 times a day. Twelve coughs today and one throat clear today. SLP used SOVTE techniques with pt (tongue trills) today with some success in normalizing voice quality - voice was not 100% WNL but was not primarily harsh; Voice sounded mostly mildly breathy quality and pt could produce /u/ at the end of the trill with more normalized vocal quality. Pt still unable to feel a difference in tension/tightness between voice with tongue trills and voice with talking as she entered into ST room. SLP told pt to practice pitch varied tongue trills with /u/ at the end and focus on how it feels.  Pt is interested with referral to [facility]. Told pt to inquire with PCP for referral.   04/30/24: 3 coughs in first 3 minutes, immediately following saying something to SLP. Immediately, SLP began with pt directing her to release of tension in laryngeal area, wth pt endorsing tightness and tension in laryngeal area at that time. After 4 minutes of AB pt cont'd with audible respiration and req'd cues from SLP to minimize this. Pt's audible respiration decr'd by approx 50% with these SLP cues for pt to focus on visualization of relaxation of laryngeal area. With focus on AB and relaxation for 17 minutes pt did not ever completely eliminate audible respiration. One cough in  today's 17 minutes of focus on AB. SLP attempted some RVT with pt today but after 5 reps with SLP model pt's level of aphonia did not decrease. SLP to suggest referral to [facility] if pt without progress in next 2-3 sessions. SLP STRONGLY  suggested pt practice AB 15 minutes BID instead of for 5-10 minutes 3-4 times a day, in addition to practicing laryngeal control strategies for 5 minutes, 3-4 times a day.  04/26/24:  4 Coughs today; pt took water  mostly, as an alternative. Today she arrived with audible inhalation and exhalation. She has been working with AB and cough suppression/laryngeal control strategies at home - SLP strongly encouraged pt to do 15 minutes BID and focus on silent breasting. After 45 seconds of practice with AB her inhalation became silent but her exhalation remained audible albeit quieter than upon entrance into ST. SLP worked with pt with resonant voice therapy tasks /m/ and /u/ with feeling vibration - pt rated amount of vibration felt on scale 1/5 (1=none) and pt stated 1 to begin but by session end was at 3/5. Pt's voice became slightly stronger (had more depth and less breathy) after this practice.   04/24/24: Pt was sleeping and felt like she had something in her throat that wouldn't get coughed up or swallowed down. O2 sat decr'd to 93 SpO2, and she ate and it got pushed down.  Audible respiration heard as pt entered ST room. Pt with 2 throat clears and 4 coughs prior to focus on AB. One cough 40 seconds into AB practice at rest. 8 minutes 15 seconds later, no coughs or throat clears were heard as pt cont'd to feel AB. Audible respirations for 4 minutes before pt reminded pt of relaxation strategies and at 6 minutes 35 seconds respirations were silent. 5 throat clears/coughs after this time. SLP strongly reiterated to pt she MUST practice AB 15 minutes BID, and practice cough suppression and laryngeal control strategies throughout the day for  more success. SLP further educated pt why SLP suspected she was having audible respirations due to vocal fold tension; SLP reviewed how helpful imagery can be and suggested pt think of a few images that will assist her relaxation of her laryngeal area. It IS tight there, pt  stated during this practice with SLP for imagery. Pt respiration was silent when she left the ST room.  04/17/24: Terry Shannon worked on AB more than between previous appointment and two appointments ago but still not at recommended level. Pt with very noisy breathing upon entry into ST room so SLP guided pt through working on AB and laryngeal relaxation techniques due to noisy breathing.This took 12 minutes to ultimately achieve close-to-silent breathing using visualizations and verbal cues, with explanations. Pt with 7 throat clear and 4 coughs today, with only 4 clears and one cough in the 30 minutes SLP worked with pt focusing on AB. SLP strongly encouraged pt to adhere to 15 minutes of practice BID and reiterated the rationale for this.   04/15/24: Terry Shannon did not practice AB since last session. She cleared throat 6 times and coughed twice in the first 5 minutes prior to focus of AB. SLP began guiding pt through AB practice, and relaxation strategies practice, along with cont'd training for techniques for laryngeal control; Pt coughed once and cleared throat 11 times in the next 35 minutes. SLP drew pt's attention to this and strongly encouraged her to practice AB 12-15 minutes BID.  04/10/24: Pt saw Dr. Mila at [phone]  today, as she was feeling like she could not clear mucous out of throat and when she did she had difficulty breathing. Dr. Mila, per her note, noted mucopurulent drainage. Pt has been practicing cough suppression strategies since the eval. In the first 5 minutes she had 8 throat clears and 2 coughs. SLP introduced abdominal breathing with pt and she was successful quickly, at rest. SLP also introduced pt to visualizations for relaxation during this time. In the last 10 minutes In these 15 minutes pt had 7 throat clears and 2 coughs. SLP reviewed pt's cough suppression strategies with her during the next 10 minutes and pt had 7 throat clears and 2 coughs. SLP pointed out that when pt not focused  on AB or relaxation strategies for 5 minutes she had identical frequency to 15 minutes of focus on AB and relaxation strategies. Pt's breathing was whisper-sounding for the first 20 minutes of the session until SLP focused on cough suppression strategies with pt and then it was silent until session end when it became more noisy again.  Pt to practice AB 10-15 minutes BID, and work on quiet breathing with visulaizations.  04/03/24: SLP and pt talked about laryngeal control strategies and cough suppression strategies. Today these appeared to assist pt as she did not cough or throat clear > once during the 1-2 mintues while practicing.   PATIENT EDUCATION: Education details: see treatment date Person educated: Patient Education method: Explanation, Demonstration, Verbal cues, and Handouts Education comprehension: verbalized understanding, returned demonstration, verbal cues required, and needs further education  HOME EXERCISE PROGRAM: Cough suppression and laryngeal control strategies, relaxation strategies, AB  GOALS: Goals reviewed with patient? Yes  SHORT TERM GOALS: Target date: 04/30/24  Pt will demo AB at rest for 2 minutes without cough or throat clear Baseline: Goal status: met  2.  Pt will report use of a strategy to successfully suppress cough within 30 seconds between two sessions Baseline: 04/30/24 Goal status: partially met  3.  Pt will demo 4 strategies to suppress cough/control larynx in 3 sessions Baseline:  Goal status: not met   LONG TERM GOALS: Target date: 06/02/24  Pt will demo AB with sentence responses 90% in 4 minutes without cough or throat clear  Baseline:  Goal status: INITIAL  2.  Pt will demo AB with 4 minutes conversation without cough or throat clear Baseline:  Goal status: INITIAL  3.  Pt will report use of a strategy to successfully suppress cough within 30 seconds in or between three sessions, after 05/02/24 Baseline:  Goal status:  INITIAL  4.  Pt will improve at least one PROM Baseline:  Goal status: INITIAL   ASSESSMENT:  CLINICAL IMPRESSION: Patient is a 68 y.o. F who was seen today for voice therapy due to chronic cough. 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 See treatment date above for today's date for further details on today's session. Today pt coughed 12 times and cleared throat once. SLP used SOVTE with some success on normalizing pt's voice quality.  On day of eval pt cleared throat 44 times and coughed 7 times. When she was asked then if her cough interfered with her ability to communicate, she answered, It is right now.  OBJECTIVE IMPAIRMENTS: include voice disorder. These impairments are limiting patient from effectively communicating at home and in community. Factors affecting potential to achieve goals and functional  outcome are severity of impairments.. Patient will benefit from skilled SLP services to address above impairments and improve overall function.  REHAB POTENTIAL: Good  PLAN:  SLP FREQUENCY: 1-2x/week  SLP DURATION: 8 weeks  PLANNED INTERVENTIONS: Internal/external aids, Functional tasks, SLP instruction and feedback, Compensatory strategies, Patient/family education, [phone] Treatment of speech (30 or 45 min) , and voice exercises, laryngeal control, and cough suppression strategies    Honorio Devol, CCC-SLP 05/07/2024, 3:33 PM  Referring diagnosis:   R05.3 (ICD-10-CM) - Chronic cough     Treatment diagnosis (if different than referring diagnosis): other voice and resonance disorders ICD-10-CM: R49.8   Date Symptoms Began: 05/23/2021 # of Visits requested: 17  Time period for Authorization: 04/03/24 to 06/02/24  What was this (referring dx) caused by? 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     If [employer]: Choose 10 or less codes  If [employer]: Modalities are not covered  If [employer]: Check allowed ICD code combinations   If [facility] Plan or Cigna: Cognitive training not covered

## 2024-05-07 ENCOUNTER — Ambulatory Visit

## 2024-05-07 DIAGNOSIS — R498 Other voice and resonance disorders: Secondary | ICD-10-CM | POA: Diagnosis not present

## 2024-05-07 DIAGNOSIS — R1310 Dysphagia, unspecified: Secondary | ICD-10-CM

## 2024-05-09 ENCOUNTER — Other Ambulatory Visit: Payer: Self-pay | Admitting: Internal Medicine

## 2024-05-09 DIAGNOSIS — M7989 Other specified soft tissue disorders: Secondary | ICD-10-CM

## 2024-05-10 ENCOUNTER — Emergency Department (HOSPITAL_BASED_OUTPATIENT_CLINIC_OR_DEPARTMENT_OTHER)

## 2024-05-10 ENCOUNTER — Encounter (HOSPITAL_BASED_OUTPATIENT_CLINIC_OR_DEPARTMENT_OTHER): Payer: Self-pay

## 2024-05-10 ENCOUNTER — Emergency Department (HOSPITAL_BASED_OUTPATIENT_CLINIC_OR_DEPARTMENT_OTHER)
Admission: EM | Admit: 2024-05-10 | Discharge: 2024-05-10 | Disposition: A | Discharge status: Home / Self Care | Attending: Emergency Medicine | Admitting: Emergency Medicine

## 2024-05-10 ENCOUNTER — Other Ambulatory Visit: Payer: Self-pay

## 2024-05-10 DIAGNOSIS — M7989 Other specified soft tissue disorders: Secondary | ICD-10-CM | POA: Diagnosis present

## 2024-05-10 DIAGNOSIS — M7121 Synovial cyst of popliteal space [Baker], right knee: Secondary | ICD-10-CM | POA: Insufficient documentation

## 2024-05-10 DIAGNOSIS — Z96651 Presence of right artificial knee joint: Secondary | ICD-10-CM | POA: Diagnosis not present

## 2024-05-10 NOTE — ED Provider Notes (Signed)
 " [place] EMERGENCY DEPARTMENT AT [facility] Provider Note   CSN: [id number] Arrival date & time: 05/10/24  1457     Patient presents with: Leg Swelling (Right )   Terry Shannon is a 68 y.o. female.  With a past medical history of RA, ulcerative colitis, previous right total knee arthroplasty who presents emergency department chief complaint of right leg swelling.  Patient reports she began having pain in the back of her calf and behind her knee on the right side about a week ago thought she might of pulled a muscle however over the past today developed significant unilateral swelling in the right lower extremity.  She denies any onset of chest pain or shortness of breath.  She was concerned she could have a blood clot and came in for further evaluation.   HPI     Prior to Admission medications  Medication Sig Start Date End Date Taking? Authorizing Provider  clotrimazole-betamethasone  (LOTRISONE) cream PLEASE SEE ATTACHED FOR DETAILED DIRECTIONS 04/24/24  Yes [provider]  rosuvastatin (CRESTOR) 20 MG tablet Take 20 mg by mouth at bedtime. 03/20/24  Yes [provider]  ALPRAZolam  (XANAX ) 0.5 MG tablet Take only before procedures (injections into the neck and low back) up to 3x a day Patient not taking: Reported on 04/10/2024 12/15/20   Ines Onetha NOVAK, MD  Ascorbic Acid (VITAMIN C PO) Take 6,000 mg by mouth daily.    [provider]  aspirin  EC 325 MG tablet Take 325 mg by mouth 2 (two) times daily as needed for mild pain.  Patient not taking: Reported on 04/10/2024    [provider]  azelastine  (ASTELIN ) 0.1 % nasal spray Place 2 sprays into both nostrils 2 (two) times daily. Use in each nostril as directed 04/01/24   Dharap, Anuja R, DO  B Complex-C (SUPER B COMPLEX PO) Take 1 tablet by mouth daily.    [provider]  cephALEXin  (KEFLEX ) 500 MG capsule Take 500 mg by mouth every 6 (six) hours. 03/09/24   [provider]  Cholecalciferol  (VITAMIN D3 PO) Take 1 tablet by mouth daily.     [provider]  cyclobenzaprine  (FLEXERIL ) 10 MG tablet Take 10 mg by mouth 3 (three) times daily as needed for muscle spasms. Patient taking differently: Take 10 mg by mouth as needed for muscle spasms.    [provider]  desvenlafaxine (PRISTIQ) 100 MG 24 hr tablet Take 100 mg by mouth daily.     [provider]  famotidine  (PEPCID ) 40 MG tablet Take 40 mg by mouth 2 (two) times daily.    [provider]  Flax OIL Take by mouth daily.    [provider]  gabapentin  (NEURONTIN ) 300 MG capsule Take 300 mg by mouth 4 (four) times daily.     [provider]  golimumab  (SIMPONI  ARIA) 50 MG/4ML SOLN injection See admin instructions.    [provider]  ipratropium (ATROVENT ) 0.02 % nebulizer solution Take 2.5 mLs (0.5 mg total) by nebulization 4 (four) times daily. 03/01/22   Desai, Nikita S, MD  levocetirizine (XYZAL ) 5 MG tablet Take 1 tablet (5 mg total) by mouth every evening. 04/01/24   Dharap, Anuja R, DO  levothyroxine (SYNTHROID) 50 MCG tablet Take 50 mcg by mouth daily before breakfast.    [provider]  losartan (COZAAR) 50 MG tablet Take 1 tablet by mouth every day for blood pressure 10/12/23   [provider]  mesalamine  (LIALDA ) 1.2 G  EC tablet Take 2.4 g by mouth 2 (two) times daily.     [provider]  MIEBO 1.338 GM/ML SOLN     [provider]  naphazoline-pheniramine (NAPHCON-A) 0.025-0.3 % ophthalmic solution Place 1 drop into both eyes 4 (four) times daily as needed for eye irritation. 03/13/22   Smoot, Lauraine LABOR, PA-C  oxybutynin  (DITROPAN -XL) 10 MG 24 hr tablet Take 10 mg by mouth daily.    [provider]  pantoprazole  (PROTONIX ) 40 MG tablet Oral; Duration: 90 Days    [provider]  Probiotic Product (PROBIOTIC DAILY PO) Take 1 tablet by mouth daily.     [provider]   propranolol  (INDERAL ) 10 MG tablet TAKE 1 TAB 30-60 MINUTES PRIOR TO EXERCISE FOR EXERCISE-INDUCED HEADACHES. MAX TWICE DAILY 11/21/22   Millikan, Megan, NP  propranolol  (INDERAL ) 20 MG tablet Take 20 mg by mouth 2 (two) times daily.    [provider]  RESTASIS MULTIDOSE 0.05 % ophthalmic emulsion Place 1 drop into both eyes 2 (two) times daily. 10/09/18   [provider]  Rosuvastatin Calcium (CRESTOR PO) Take 20 mg by mouth daily.     [provider]  sucralfate (CARAFATE) 1 g tablet Take 1 g by mouth 4 (four) times daily. Patient not taking: Reported on 04/10/2024 02/15/22   [provider]  SUMAtriptan  (IMITREX ) 100 MG tablet TAKE 1 TABLET BY MOUTH ONCE DAILY AS NEEDED. MAY REPEAT IN 2 HOURS IF HEADACHE PERSISTS OR RECURS. 04/15/24   Millikan, Megan, NP  thyroid  (ARMOUR) 60 MG tablet Take 60 mg by mouth daily before breakfast.    [provider]  triamcinolone  (NASACORT ) 55 MCG/ACT AERO nasal inhaler Place 2 sprays into the nose daily. 04/01/24   Dharap, Anuja R, DO  zolpidem  (AMBIEN  CR) 12.5 MG CR tablet TAKE ONE TABLET BY MOUTH DAILY AT BEDTIME 05/05/22   Ines Onetha NOVAK, MD  zonisamide  (ZONEGRAN ) 100 MG capsule TAKE 1 CAPSULE (100 MG TOTAL) BY MOUTH DAILY. FOR MIGRAINE PREVENTION. 12/14/23   Millikan, Megan, NP    Allergies: Codeine, Hydrocodone, Ibuprofen, Iodinated contrast media, Methotrexate and trimetrexate, Oxycodone , Tylenol  [acetaminophen ], Aleve [naproxen sodium], Sulfa antibiotics, and Neosporin [neomycin-bacitracin zn-polymyx]    Review of Systems  Updated Vital Signs BP (!) 154/86   Pulse 63   Temp 98 F (36.7 C)   Resp 20   Ht 5' 5 (1.651 m)   Wt 73.5 kg   SpO2 100%   BMI 26.96 kg/m   Physical Exam Vitals and nursing note reviewed.  Constitutional:      General: She is not in acute distress.    Appearance: She is well-developed. She is not diaphoretic.  HENT:     Head: Normocephalic and atraumatic.     Right Ear:  External ear normal.     Left Ear: External ear normal.     Nose: Nose normal.     Mouth/Throat:     Mouth: Mucous membranes are moist.  Eyes:     General: No scleral icterus.    Conjunctiva/sclera: Conjunctivae normal.  Cardiovascular:     Rate and Rhythm: Normal rate and regular rhythm.     Heart sounds: Normal heart sounds. No murmur heard.    No friction rub. No gallop.  Pulmonary:     Effort: Pulmonary effort is normal. No respiratory distress.     Breath sounds: Normal breath sounds.  Abdominal:     General: Bowel sounds are normal. There is no distension.  Palpations: Abdomen is soft. There is no mass.     Tenderness: There is no abdominal tenderness. There is no guarding.  Musculoskeletal:     Cervical back: Normal range of motion.     Right lower leg: Edema present.     Comments: Swelling and tenderness RLE  + homan's sign  R knee effusion Old TKA scar R knee NVI  Skin:    General: Skin is warm and dry.  Neurological:     Mental Status: She is alert and oriented to person, place, and time.  Psychiatric:        Behavior: Behavior normal.     (all labs ordered are listed, but only abnormal results are displayed) Labs Reviewed - No data to display  EKG: None  Radiology: No results found.   Procedures   Medications Ordered in the ED - No data to display                                  Medical Decision Making  Patient here with right lower extremity swelling.  I ordered visualized and interpreted ultrasound of the right lower extremity which shows no evidence of DVT.  She has a popliteal fluid swelling consistent with Baker's cyst.  There is an obvious joint effusion of the right knee as well. Patient be discharged with symptomatic care referral back to orthopedics.  Strict return precautions.  Discussed all findings with the patient at bedside appropriate for discharge at this time     Final diagnoses:  None    ED Discharge Orders     None           Arloa Chroman, PA-C 05/10/24 2052    Pamella Ozell LABOR, DO 05/16/24 1314  "

## 2024-05-10 NOTE — ED Triage Notes (Signed)
 Arrives ambulatory to the ED with complaints increased swelling and pain to her right calf for one week. Patient is here to having imaging of her right leg.

## 2024-05-10 NOTE — Discharge Instructions (Signed)
 ### Baker's Cyst Care Guide     **What is a Baker's Cyst?**      A Baker's cyst (also called a popliteal cyst) is a fluid-filled swelling that develops behind your knee. It forms when fluid from your knee joint collects in a small sac in the back of your knee. In adults, Baker's cysts usually happen when there is another problem inside the knee, such as arthritis or a torn meniscus (cartilage).[1][2]      **Home Treatment and Self-Care**      Most Baker's cysts can be managed at home without surgery. Here are steps you can take:      - **Rest and Activity Modification**: Avoid activities that make your knee pain worse, especially repetitive bending or squatting. Take breaks during activities that stress your knee.      - **Ice Application**: Apply ice to the back of your knee for 15-20 minutes at a time, several times a day, especially after activities. This can help reduce swelling and pain.      - **Compression**: Wearing a compression sleeve or wrap on your knee may help reduce swelling and provide support.[2]      - **Elevation**: When resting, prop your leg up on pillows to help reduce swelling.      - **Over-the-Counter Pain Relief**: Nonsteroidal anti-inflammatory drugs (NSAIDs) like ibuprofen or naproxen can help reduce pain and inflammation. Follow the package directions and talk to your doctor if you have questions about which medication is right for you.[2]      **What to Expect**      Baker's cysts may come and go over time. In children, these cysts often go away on their own without treatment. In adults, the cyst may persist or return because it is usually connected to an underlying knee problem.[1][2]      **When to See Your Doctor**      Contact your healthcare provider if you experience:      - Severe pain or swelling in your calf (this could indicate a ruptured cyst or blood clot)      - Redness or warmth in your leg      - Difficulty walking or bearing weight on  your leg      - No improvement with home treatment after several weeks      - Worsening symptoms      **Medical Treatment Options**      If home treatment doesn't help, your doctor may recommend:      - **Corticosteroid Injection**: Your doctor can drain the cyst using ultrasound guidance and inject a steroid medication to reduce inflammation. This can provide pain relief and reduce the size of the cyst.[3][4][5][6][7]      - **Physical Therapy**: A structured rehabilitation program may help improve knee function and reduce symptoms.[8][5]      - **Treatment of Underlying Conditions**: Addressing the root cause, such as arthritis or a meniscus tear, is important for long-term management.[1][2][9]      - **Surgery**: In rare cases where other treatments haven't worked, surgical removal of the cyst may be considered.[2][9]      **Important Reminders**      - Baker's cysts are usually not dangerous, but they can be uncomfortable.      - Treating any underlying knee problems is key to preventing the cyst from coming back.      - Many people find relief with conservative treatment and don't need invasive procedures.      - Keep  your follow-up appointments so your doctor can monitor your progress.      If you have questions about your treatment plan or symptoms, don't hesitate to contact your healthcare provider.      ### References  1. Popliteal Cysts: A Current Review. Herman AM, Lawana JM. Orthopedics. 2014;37(8):e678-84. doi:10.3928/[id number]. 2. Popliteal Cysts: Historical Background and Current Knowledge. [employer]. The Journal of the American Academy of Orthopaedic Surgeons. 1996;4(3):129-133. doi:10.5435/[id number]. 3. Ultrasound Guided Percutaneous Treatment and Follow-Up of Baker's Cyst in Knee Osteoarthritis. Kro?lu M, Call?o?lu M, Eri? HN, et al. European Journal of Radiology. 2012;81(11):3466-71. doi:10.1016/j.ejrad.2012.05.015. 4. Ultrasound-Guided  Aspiration and Corticosteroid Injection of Baker's Cysts in Knee Osteoarthritis: A Prospective Observational Study. Leona Thomasena CROME, Paoloni M, Ioppolo F, et al. American Journal of Physical Medicine & Rehabilitation. 2010;89(12):970-5. doi:10.1097/[id number]. 5. Ultrasound-Guided Aspiration and Corticosteroid Injection Compared to Horizontal Therapy for Treatment of Knee Osteoarthritis Complicated With Baker's Cyst: A Randomized, Controlled Trial. Leona Thomasena CROME, Paoloni M, Dimaggio M, et al. European Journal of Physical and Rehabilitation Medicine. 2012;48(4):561-7. 6. Longitudinal Ultrasound and Clinical Follow-Up of Baker's Cysts Injection With Steroids in Knee Osteoarthritis. Council FALCON, Fedi R, Generini S, et al. Clinical Rheumatology. 2012;31(4):727-31. doi:10.1007/s10067-617-602-2468-9. 7. Ultrasonographic Assessment of Baker's Cysts After Intra-Articular Corticosteroid Injection in Knee Osteoarthritis. Acebes JC, Snchez-Pernaute O, Daz-Oca A, Herrero-Beaumont G. Journal of Clinical Ultrasound : JCU. 2006 Mar-Apr;34(3):113-7. doi:10.1002/jcu.20210. 8. Clinical Evidence Regarding the Dynamic of Baker Cyst Dimensions After Intermittent Vacuum Therapy as Rehabilitation Treatment in Patients With Knee Osteoarthritis. Ionescu EV, Stanciu LE, Bujduveanu A, et al. Journal of Clinical Medicine. 2023;12(20):6605. doi:10.3390/[id number]. 9. Cysts About the Knee: Evaluation and Management. Loris JONETTA Eleno CHRISTELLA Minna B, Hoelscher C. The Journal of the American Academy of Orthopaedic Surgeons. 2013;21(8):469-79. doi:10.5435/JAAOS-21-08-469.

## 2024-05-14 ENCOUNTER — Other Ambulatory Visit

## 2024-05-14 ENCOUNTER — Ambulatory Visit

## 2024-05-24 NOTE — Therapy (Signed)
 " OUTPATIENT PHYSICAL THERAPY LOWER EXTREMITY EVALUATION   Patient Name: Terry Shannon MRN: [id number] DOB:08/23/1955, 69 y.o., female Today's Date: 05/27/2024  END OF SESSION:  PT End of Session - 05/27/24 1533     Visit Number 1    Date for Recertification  07/22/24    Authorization Type MCR    PT Start Time 1533    PT Stop Time 1611    PT Time Calculation (min) 38 min    Activity Tolerance Patient tolerated treatment well    Behavior During Therapy WFL for tasks assessed/performed          Past Medical History:  Diagnosis Date   Allergy     year-round, pt. 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   right   Complication of anesthesia    slow to wake up after gallbladder surgery   Depression    Eczema    ARMS AND HANDS   GERD (gastroesophageal reflux disease)    History of thyroid  cancer    Hypothyroidism    Migraines    Osteoarthritis    Overactive bladder    RA (rheumatoid arthritis) (HCC)    Sleep apnea    no CPAP use   Squamous cell carcinoma of skin 12/21/2017   in situ-mid chest ([id number])   Ulcerative colitis (HCC)    Past Surgical History:  Procedure Laterality Date   ABDOMINAL HERNIA REPAIR  03/13/2008   periumbilical ventral hernia/incisional hernia   BUNIONECTOMY Right    x 2 more   BUNIONECTOMY Left    BUNIONECTOMY WITH CHILECTOMY Right 12/16/2005   CARPAL TUNNEL RELEASE Right 07/13/2001   CARPAL TUNNEL RELEASE Left 08/10/2001   DILATION AND CURETTAGE OF UTERUS     HAMMER TOE SURGERY Left may 2014   HYSTEROSCOPY WITH D & C  03/01/2004   with exc. of endometrial polyp   KNEE ARTHROSCOPY Right 2013   LAPAROSCOPIC CHOLECYSTECTOMY  11/03/2006   LUMBAR FUSION  03/06/2014   l4  l5    nerve ablation lumbar      THYROIDECTOMY, PARTIAL Right prior to 2002   THYROIDECTOMY, PARTIAL Left 11/29/2000   and isthmus   TONSILLECTOMY  1961   TOTAL ANKLE REPLACEMENT Left 11/2016   with tendon repair   TOTAL KNEE ARTHROPLASTY  Right 04/01/2013   Procedure: RIGHT TOTAL KNEE ARTHROPLASTY;  Surgeon: [name], MD;  Location: [facility] OR;  Service: Orthopedics;  Laterality: Right;   Patient Active Problem List   Diagnosis Date Noted   Asthmatic bronchitis, mild persistent, uncomplicated 01/27/2022   Upper airway cough syndrome 01/27/2022   OSA on CPAP 04/27/2021   Intolerance of continuous positive airway pressure (CPAP) ventilation 06/17/2020   OSA (obstructive sleep apnea) 05/06/2020   Snoring 05/06/2020   Insomnia secondary to chronic pain 03/24/2020   History of sleep apnea 03/24/2020   Gasping for breath 03/24/2020   Abnormal glucose level 12/16/2019   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   Degeneration of lumbar intervertebral disc 12/16/2019   Hyperlipidemia 12/16/2019   Hypothyroidism 12/16/2019   Insomnia 12/16/2019   Migraine 12/16/2019   Overweight 12/16/2019   Postoperative hypothyroidism 12/16/2019   Vitamin D  deficiency 12/16/2019   Hallux rigidus, right foot 12/05/2019   Hammer toe of right foot 12/05/2019   History of left ankle joint replacement 12/05/2019   Aftercare following ankle joint replacement surgery 01/10/2017   Anxiety, mild 12/12/2016   Atypical chest pain 12/12/2016   Cardiac murmur 12/12/2016  GERD (gastroesophageal reflux disease) 12/12/2016   History of thyroid  cancer 12/12/2016   Post-traumatic osteoarthritis of left ankle 10/03/2016   Chronic migraine without aura, with intractable migraine, so stated, with status migrainosus 09/26/2015   Joint pain 02/18/2015   Abnormal C-reactive protein 01/08/2015   Radiculopathy 03/06/2014   Osteoarthritis of right knee 04/01/2013   OA (osteoarthritis) 11/01/2011   OAB (overactive bladder)    Ulcerative colitis (HCC)    Cancer (HCC)    Carpal tunnel syndrome    RA (rheumatoid arthritis) (HCC)     PCP: Theo Iha, MD   REFERRING PROVIDER: Irving Delinda ORN, MD   REFERRING DIAG: [phone] (ICD-10-CM) - Strain  of other muscle(s) and tendon(s) of posterior muscle group at lower leg level, right leg, subsequent encounter    THERAPY DIAG:  Pain in right lower leg  Stiffness of right ankle, not elsewhere classified  Other abnormalities of gait and mobility  Cramp and spasm  Rationale for Evaluation and Treatment: Rehabilitation  ONSET DATE: December 2025  SUBJECTIVE:   SUBJECTIVE STATEMENT: Stood up and felt something in the back of her knee. In the next day or two pain moved into her calf. Then ankle and leg swelled up. MRI scheduled 06/03/23. Wearing compression stocking for swelling. Now using cane. Tender to rest calf on sofa.  PERTINENT HISTORY: R gastrocnemius tendon tear,  R TKA, vocal cord surgery, ankle replacement L, HOH, OA, RA, lumbar fusion PAIN:  Are you having pain? Yes: NPRS scale: 0/10 today 4/10 with prolonged activity Pain location: R calf Pain description: unsure Aggravating factors: prolonged activity or pressure on it Relieving factors: aspirin , ice and elevation, compression stocking  PRECAUTIONS: None  RED FLAGS: None   WEIGHT BEARING RESTRICTIONS: no   FALLS:  Has patient fallen in last 6 months? No  LIVING ENVIRONMENT: Lives with: lives with their family Lives in: House/apartment Stairs: Yes: External: 6 steps; can reach both Has following equipment at home: Single point cane and Walker - 2 wheeled  OCCUPATION: retired  PLOF: Independent  PATIENT GOALS: get to where I can walk without a cane and drive again  NEXT MD VISIT: seeing Dr. Yvone 06/07/23 after MRI 1/11  OBJECTIVE:  Note: Objective measures were completed at Evaluation unless otherwise noted.  DIAGNOSTIC FINDINGS: gastroc tendon tear; popliteal cyst, negative DVT  PATIENT SURVEYS:  LEFS 26 / 80 = 32.5 %  COGNITION: Overall cognitive status: Within functional limits for tasks assessed     SENSATION: WFL  EDEMA:  Managed with compression stocking   MUSCLE LENGTH: Tight R  gastroc  POSTURE: rounded shoulders, forward head, and weight shift left  PALPATION: Palpation: TTP at R medial gastroc, achilles tendon but not at insertion. Increased tissue tension in medial gastroc  LOWER EXTREMITY ROM: WFL for tasks assessed  Active ROM Right eval Left eval  Hip flexion    Hip extension    Hip abduction    Hip adduction    Hip internal rotation    Hip external rotation    Knee flexion 125   Knee extension 0   Ankle dorsiflexion -4/3 with knee straight; 5/10 deg with knee bent   Ankle plantarflexion    Ankle inversion    Ankle eversion     (Blank rows = not tested)  LOWER EXTREMITY MMT:  MMT Right eval Left eval  Hip flexion 4+ 4  Hip extension    Hip abduction    Hip adduction    Hip internal rotation  Hip external rotation    Knee flexion    Knee extension 5   Ankle dorsiflexion 5   Ankle plantarflexion    Ankle inversion 5   Ankle eversion 5    (Blank rows = not tested)  FUNCTIONAL TESTS:  5 times sit to stand: 13.35 seconds no UE support shifts L Timed up and go (TUG): 12.29 sec   GAIT: Distance walked: 20 Assistive device utilized: Single point cane and None Level of assistance: Modified independence Comments: with cane and without decreased stance                                                                                                                                 TREATMENT DATE:   05/27/24  See pt ed and HEP   PATIENT EDUCATION:  Education details: PT eval findings, anticipated POC, initial HEP, and discussion of use of compression stocking   Person educated: Patient Education method: Explanation, Demonstration, Tactile cues, Verbal cues, and Handouts Education comprehension: verbalized understanding and returned demonstration  HOME EXERCISE PROGRAM: Access Code: [email or web address] URL: [email or web address] Date: 05/27/2024 Prepared by: Mliss  Exercises - Seated Ankle Circles  - 3 x daily - 7 x weekly  - 3 sets - 10 reps - Seated Ankle Alphabet  - 3 x daily - 7 x weekly - 1 sets - 2 reps - Seated Heel Raise  - 3 x daily - 7 x weekly - 1-2 sets - 10 reps - Seated Toe Raise  - 3 x daily - 7 x weekly - 1-2 sets - 10 reps - Seated Ankle Inversion AROM  - 3 x daily - 7 x weekly - 3 sets - 10 reps - Seated Ankle Eversion AROM  - 1 x daily - 3 x weekly - 2 sets - 10 reps - Long Sitting Calf Stretch with Strap (Mirrored)  - 2 x daily - 7 x weekly - 1 sets - 3 reps - 30 sec hold  ASSESSMENT:  CLINICAL IMPRESSION: Patient is a 69 y.o. female who was seen today for physical therapy evaluation and treatment for R lower leg pain secondary to a suspected gastrocnemius tendon tear. She presents with a SPC and decreased stance time on the R LE. She demonstrates deficits in R ankle DF and strength, gastrocnemius flexibility and her gait. Her LEFS score indicates only 32% funcion. Pain and deficits affect her ability to walk without an AD, perform her normal chores and drive. She will benefit from skilled PT to address these deficits and those listed below.  She has an MRI scheduled on 06/02/24. .   OBJECTIVE IMPAIRMENTS: Abnormal gait, decreased activity tolerance, decreased balance, decreased knowledge of condition, difficulty walking, decreased ROM, decreased strength, increased edema, increased muscle spasms, impaired flexibility, postural dysfunction, and pain.   ACTIVITY LIMITATIONS: carrying, lifting, standing, squatting, stairs, bathing, dressing, hygiene/grooming, and locomotion level  PARTICIPATION LIMITATIONS: meal prep, cleaning, laundry, driving, shopping, community activity, and yard  work  PERSONAL FACTORS: Age and 3+ comorbidities:  R TKA,  ankle replacement L, HOH, OA, RA, lumbar fusion are also affecting patient's functional outcome.   REHAB POTENTIAL: Excellent  CLINICAL DECISION MAKING: Evolving/moderate complexity  EVALUATION COMPLEXITY: Low   GOALS: Goals reviewed with patient?  Yes  SHORT TERM GOALS: Target date: 06/24/2024    Ind with initial HEP Baseline: Goal status: INITIAL  2.  Able to safely ambulate without AD Baseline:  Goal status: INITIAL  3.  Decreased R calf pain by 25% with ADLs and when resting leg on bed or ottoman. Baseline:  Goal status: INITIAL   LONG TERM GOALS: Target date: 07/22/2024   Ind with advanced HEP and its progression Baseline:  Goal status: INITIAL  2. Improved R ankle DF to 5 - 10  deg to normalize gait and functional mobility Baseline:  Goal status: INITIAL  3.  Able to perform R single leg heel raise showing improved plantar flexion strength. Baseline:  Goal status: INITIAL  4.  Able to climb stairs with a reciprocal gait pattern Baseline:  Goal status: INITIAL  5.  Patient able to drive without pain or difficulty. Baseline:  Goal status: INITIAL  6.  Pt able to safely amb 600 ft in 6 minutes to allow access to community. Baseline:  Goal status: INITIAL  7.  Improved LEFS to 50/80 or better showing functional improvement Baseline: 26/80 Goal status:INITIAL    PLAN:  PT FREQUENCY: 2x/week  PT DURATION: 8 weeks  PLANNED INTERVENTIONS: 97110-Therapeutic exercises, 97530- Therapeutic activity, 97112- Neuromuscular re-education, 97535- Self Care, 02859- Manual therapy, [phone]- Gait training, [phone]- Aquatic Therapy, [phone]- Electrical stimulation (unattended), [phone]- Electrical stimulation (manual), [id number]- Vasopneumatic device, [id number]- Ultrasound, [id number]- Ionotophoresis 4mg /ml Dexamethasone , 79439 (1-2 muscles), [id number] (3+ muscles)- Dry Needling, Patient/Family education, Balance training, Stair training, Taping, Joint mobilization, DME instructions, Cryotherapy, and Moist heat  PLAN FOR NEXT SESSION: Review and progress HEP, ROM, strengthening, gait, balance, manual/DN to medial gastroc/soleus, modalities prn    Mliss Cummins, PT 05/27/2024 5:34 PM   "

## 2024-05-27 ENCOUNTER — Ambulatory Visit: Attending: Family Medicine | Admitting: Physical Therapy

## 2024-05-27 ENCOUNTER — Other Ambulatory Visit: Payer: Self-pay

## 2024-05-27 DIAGNOSIS — M25671 Stiffness of right ankle, not elsewhere classified: Secondary | ICD-10-CM | POA: Insufficient documentation

## 2024-05-27 DIAGNOSIS — S86111D Strain of other muscle(s) and tendon(s) of posterior muscle group at lower leg level, right leg, subsequent encounter: Secondary | ICD-10-CM | POA: Diagnosis not present

## 2024-05-27 DIAGNOSIS — X58XXXD Exposure to other specified factors, subsequent encounter: Secondary | ICD-10-CM | POA: Insufficient documentation

## 2024-05-27 DIAGNOSIS — R2689 Other abnormalities of gait and mobility: Secondary | ICD-10-CM | POA: Diagnosis not present

## 2024-05-27 DIAGNOSIS — M79661 Pain in right lower leg: Secondary | ICD-10-CM | POA: Insufficient documentation

## 2024-05-27 DIAGNOSIS — R252 Cramp and spasm: Secondary | ICD-10-CM | POA: Insufficient documentation

## 2024-05-28 ENCOUNTER — Ambulatory Visit

## 2024-05-29 ENCOUNTER — Ambulatory Visit: Admitting: Physical Therapy

## 2024-05-29 ENCOUNTER — Encounter: Payer: Self-pay | Admitting: Physical Therapy

## 2024-05-29 DIAGNOSIS — S86111D Strain of other muscle(s) and tendon(s) of posterior muscle group at lower leg level, right leg, subsequent encounter: Secondary | ICD-10-CM | POA: Diagnosis not present

## 2024-05-29 DIAGNOSIS — R2689 Other abnormalities of gait and mobility: Secondary | ICD-10-CM

## 2024-05-29 DIAGNOSIS — M79661 Pain in right lower leg: Secondary | ICD-10-CM

## 2024-05-29 DIAGNOSIS — M25671 Stiffness of right ankle, not elsewhere classified: Secondary | ICD-10-CM

## 2024-05-29 NOTE — Therapy (Signed)
 " OUTPATIENT PHYSICAL THERAPY LOWER EXTREMITY PROGRESS NOTE   Patient Name: Terry Shannon MRN: [id number] DOB:10/19/1955, 69 y.o., female Today's Date: 05/29/2024  END OF SESSION:  PT End of Session - 05/29/24 1530     Visit Number 2    Date for Recertification  07/22/24    Authorization Type MCR    Progress Note Due on Visit 10    PT Start Time 1531    PT Stop Time 1615    PT Time Calculation (min) 44 min    Activity Tolerance Patient tolerated treatment well          Past Medical History:  Diagnosis Date   Allergy     year-round, pt. 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   right   Complication of anesthesia    slow to wake up after gallbladder surgery   Depression    Eczema    ARMS AND HANDS   GERD (gastroesophageal reflux disease)    History of thyroid  cancer    Hypothyroidism    Migraines    Osteoarthritis    Overactive bladder    RA (rheumatoid arthritis) (HCC)    Sleep apnea    no CPAP use   Squamous cell carcinoma of skin 12/21/2017   in situ-mid chest ([id number])   Ulcerative colitis (HCC)    Past Surgical History:  Procedure Laterality Date   ABDOMINAL HERNIA REPAIR  03/13/2008   periumbilical ventral hernia/incisional hernia   BUNIONECTOMY Right    x 2 more   BUNIONECTOMY Left    BUNIONECTOMY WITH CHILECTOMY Right 12/16/2005   CARPAL TUNNEL RELEASE Right 07/13/2001   CARPAL TUNNEL RELEASE Left 08/10/2001   DILATION AND CURETTAGE OF UTERUS     HAMMER TOE SURGERY Left may 2014   HYSTEROSCOPY WITH D & C  03/01/2004   with exc. of endometrial polyp   KNEE ARTHROSCOPY Right 2013   LAPAROSCOPIC CHOLECYSTECTOMY  11/03/2006   LUMBAR FUSION  03/06/2014   l4  l5    nerve ablation lumbar      THYROIDECTOMY, PARTIAL Right prior to 2002   THYROIDECTOMY, PARTIAL Left 11/29/2000   and isthmus   TONSILLECTOMY  1961   TOTAL ANKLE REPLACEMENT Left 11/2016   with tendon repair   TOTAL KNEE ARTHROPLASTY Right 04/01/2013    Procedure: RIGHT TOTAL KNEE ARTHROPLASTY;  Surgeon: [name], MD;  Location: [facility] OR;  Service: Orthopedics;  Laterality: Right;   Patient Active Problem List   Diagnosis Date Noted   Asthmatic bronchitis, mild persistent, uncomplicated 01/27/2022   Upper airway cough syndrome 01/27/2022   OSA on CPAP 04/27/2021   Intolerance of continuous positive airway pressure (CPAP) ventilation 06/17/2020   OSA (obstructive sleep apnea) 05/06/2020   Snoring 05/06/2020   Insomnia secondary to chronic pain 03/24/2020   History of sleep apnea 03/24/2020   Gasping for breath 03/24/2020   Abnormal glucose level 12/16/2019   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   Degeneration of lumbar intervertebral disc 12/16/2019   Hyperlipidemia 12/16/2019   Hypothyroidism 12/16/2019   Insomnia 12/16/2019   Migraine 12/16/2019   Overweight 12/16/2019   Postoperative hypothyroidism 12/16/2019   Vitamin D  deficiency 12/16/2019   Hallux rigidus, right foot 12/05/2019   Hammer toe of right foot 12/05/2019   History of left ankle joint replacement 12/05/2019   Aftercare following ankle joint replacement surgery 01/10/2017   Anxiety, mild 12/12/2016   Atypical chest pain 12/12/2016   Cardiac murmur 12/12/2016  GERD (gastroesophageal reflux disease) 12/12/2016   History of thyroid  cancer 12/12/2016   Post-traumatic osteoarthritis of left ankle 10/03/2016   Chronic migraine without aura, with intractable migraine, so stated, with status migrainosus 09/26/2015   Joint pain 02/18/2015   Abnormal C-reactive protein 01/08/2015   Radiculopathy 03/06/2014   Osteoarthritis of right knee 04/01/2013   OA (osteoarthritis) 11/01/2011   OAB (overactive bladder)    Ulcerative colitis (HCC)    Cancer (HCC)    Carpal tunnel syndrome    RA (rheumatoid arthritis) (HCC)     PCP: Theo Iha, MD   REFERRING PROVIDER: Theo Iha, MD   REFERRING DIAG: [phone] (ICD-10-CM) - Strain of other muscle(s) and  tendon(s) of posterior muscle group at lower leg level, right leg, subsequent encounter    THERAPY DIAG:  Pain in right lower leg  Stiffness of right ankle, not elsewhere classified  Other abnormalities of gait and mobility  Rationale for Evaluation and Treatment: Rehabilitation  ONSET DATE: December 2025  SUBJECTIVE:   SUBJECTIVE STATEMENT: No changes since last visit. Using SPC 50/50 in house and when in community.  I had to take aspirin  after last time.    MRI scheduled 06/03/23. Using SPC.    PERTINENT HISTORY: R gastrocnemius tendon tear,  R TKA, vocal cord surgery, ankle replacement L, HOH, OA, RA, lumbar fusion PAIN:  Are you having pain? Yes: NPRS scale: pulls but not painful today Pain location: R calf Pain description: unsure Aggravating factors: prolonged activity or pressure on it Relieving factors: aspirin , ice and elevation, compression stocking  PRECAUTIONS: None  RED FLAGS: None   WEIGHT BEARING RESTRICTIONS: no   FALLS:  Has patient fallen in last 6 months? No  LIVING ENVIRONMENT: Lives with: lives with their family Lives in: House/apartment Stairs: Yes: External: 6 steps; can reach both Has following equipment at home: Single point cane and Walker - 2 wheeled  OCCUPATION: retired  PLOF: Independent  PATIENT GOALS: get to where I can walk without a cane and drive again  NEXT MD VISIT: seeing Dr. Yvone 06/07/23 after MRI 1/11  OBJECTIVE:  Note: Objective measures were completed at Evaluation unless otherwise noted.  DIAGNOSTIC FINDINGS: gastroc tendon tear; popliteal cyst, negative DVT  PATIENT SURVEYS:  LEFS 26 / 80 = 32.5 %  COGNITION: Overall cognitive status: Within functional limits for tasks assessed     SENSATION: WFL  EDEMA:  Managed with compression stocking   MUSCLE LENGTH: Tight R gastroc  POSTURE: rounded shoulders, forward head, and weight shift left  PALPATION: Palpation: TTP at R medial gastroc, achilles tendon  but not at insertion. Increased tissue tension in medial gastroc  LOWER EXTREMITY ROM: WFL for tasks assessed  Active ROM Right eval Left eval  Hip flexion    Hip extension    Hip abduction    Hip adduction    Hip internal rotation    Hip external rotation    Knee flexion 125   Knee extension 0   Ankle dorsiflexion -4/3 with knee straight; 5/10 deg with knee bent   Ankle plantarflexion    Ankle inversion    Ankle eversion     (Blank rows = not tested)  LOWER EXTREMITY MMT:  MMT Right eval Left eval  Hip flexion 4+ 4  Hip extension    Hip abduction    Hip adduction    Hip internal rotation    Hip external rotation    Knee flexion    Knee extension 5   Ankle dorsiflexion  5   Ankle plantarflexion    Ankle inversion 5   Ankle eversion 5    (Blank rows = not tested)  FUNCTIONAL TESTS:  5 times sit to stand: 13.35 seconds no UE support shifts L Timed up and go (TUG): 12.29 sec   GAIT: Distance walked: 20 Assistive device utilized: Single point cane and None Level of assistance: Modified independence Comments: with cane and without decreased stance                                                                                                                                 TREATMENT DATE:   05/29/2024 Review of initial HEP: doing 3x/day (Discussed decreasing to 1 set 3x/day instead of 3 sets) Seated towel stretch (gentle) for gastroc 3 x30 sec  Seated HS stretch with heel on step stool 3x 30 sec  Towel scrunches 10x (Added to HEP- see below) Arch doming 10x(Added to HEP- see below) SLS on right 3x Standing WB on right with 8 inch stool taps 10x (Added to HEP- see below) Isometric heel raise and hold 10 sec 4x (Added to HEP- see below) Cold pack to medial calf with elevation for pain and edema control     05/27/24  See pt ed and HEP   PATIENT EDUCATION:  Education details: PT eval findings, anticipated POC, initial HEP, and discussion of use of compression  stocking   Person educated: Patient Education method: Explanation, Demonstration, Tactile cues, Verbal cues, and Handouts Education comprehension: verbalized understanding and returned demonstration  HOME EXERCISE PROGRAM:  Access CoAccess Code: [email or web address] URL: [email or web address] Date: 05/29/2024 Prepared by: Glade Pesa  Exercises - Seated Ankle Circles  - 3 x daily - 7 x weekly - 1 sets - 10 reps - Seated Ankle Alphabet  - 3 x daily - 7 x weekly - 1 sets - 2 reps - Seated Heel Raise  - 3 x daily - 7 x weekly - 1-2 sets - 10 reps - Seated Toe Raise  - 3 x daily - 7 x weekly - 1-2 sets - 10 reps - Seated Ankle Inversion AROM  - 3 x daily - 7 x weekly - 1 sets - 10 reps - Seated Ankle Eversion AROM  - 1 x daily - 3 x weekly - 1 sets - 10 reps - Long Sitting Calf Stretch with Strap (Mirrored)  - 2 x daily - 7 x weekly - 1 sets - 3 reps - 30 sec hold - Seated Hamstring Stretch  - 1 x daily - 7 x weekly - 1 sets - 3 reps - 30 hold - Seated Toe Towel Scrunches  - 1 x daily - 7 x weekly - 1 sets - 10 reps - Seated Arch Lifts  - 1 x daily - 7 x weekly - 1 sets - 10 reps - Forward Step Touch (Mirrored)  - 1 x daily - 7 x weekly - 1 sets - 10  reps - Isometric Heel Raise at Wall  - 1 x daily - 7 x weekly - 1 sets - 3-5 reps - 10 holdde: [email or web address] URL: [email or web address]   ASSESSMENT:  CLINICAL IMPRESSION: Patient reports good compliance with HEP although 3 sets 3x/day can increase her discomfort.  We discussed decreasing to 1 set especially as we continue to add ex's. Updated HEP to include foot intrinsic muscle strengthening and isometric strengthening of ankle.  Therapist providing verbal cues to optimize technique with  exercises in order to achieve the greatest benefit.  Since she is unable to take many anti-inflammatory medications, cold pack used for pain control.        EVAL: Patient is a 69 y.o. female who was seen today for physical therapy  evaluation and treatment for R lower leg pain secondary to a suspected gastrocnemius tendon tear. She presents with a SPC and decreased stance time on the R LE. She demonstrates deficits in R ankle DF and strength, gastrocnemius flexibility and her gait. Her LEFS score indicates only 32% funcion. Pain and deficits affect her ability to walk without an AD, perform her normal chores and drive. She will benefit from skilled PT to address these deficits and those listed below.  She has an MRI scheduled on 06/02/24. .   OBJECTIVE IMPAIRMENTS: Abnormal gait, decreased activity tolerance, decreased balance, decreased knowledge of condition, difficulty walking, decreased ROM, decreased strength, increased edema, increased muscle spasms, impaired flexibility, postural dysfunction, and pain.   ACTIVITY LIMITATIONS: carrying, lifting, standing, squatting, stairs, bathing, dressing, hygiene/grooming, and locomotion level  PARTICIPATION LIMITATIONS: meal prep, cleaning, laundry, driving, shopping, community activity, and yard work  PERSONAL FACTORS: Age and 3+ comorbidities:  R TKA,  ankle replacement L, HOH, OA, RA, lumbar fusion are also affecting patient's functional outcome.   REHAB POTENTIAL: Excellent  CLINICAL DECISION MAKING: Evolving/moderate complexity  EVALUATION COMPLEXITY: Low   GOALS: Goals reviewed with patient? Yes  SHORT TERM GOALS: Target date: 06/24/2024    Ind with initial HEP Baseline: Goal status: INITIAL  2.  Able to safely ambulate without AD Baseline:  Goal status: INITIAL  3.  Decreased R calf pain by 25% with ADLs and when resting leg on bed or ottoman. Baseline:  Goal status: INITIAL   LONG TERM GOALS: Target date: 07/22/2024   Ind with advanced HEP and its progression Baseline:  Goal status: INITIAL  2. Improved R ankle DF to 5 - 10  deg to normalize gait and functional mobility Baseline:  Goal status: INITIAL  3.  Able to perform R single leg heel raise  showing improved plantar flexion strength. Baseline:  Goal status: INITIAL  4.  Able to climb stairs with a reciprocal gait pattern Baseline:  Goal status: INITIAL  5.  Patient able to drive without pain or difficulty. Baseline:  Goal status: INITIAL  6.  Pt able to safely amb 600 ft in 6 minutes to allow access to community. Baseline:  Goal status: INITIAL  7.  Improved LEFS to 50/80 or better showing functional improvement Baseline: 26/80 Goal status:INITIAL    PLAN:  PT FREQUENCY: 2x/week  PT DURATION: 8 weeks  PLANNED INTERVENTIONS: 97110-Therapeutic exercises, 97530- Therapeutic activity, 97112- Neuromuscular re-education, 97535- Self Care, [id number]- Manual therapy, [phone]- Gait training, [phone]- Aquatic Therapy, [phone]- Electrical stimulation (unattended), [phone]- Electrical stimulation (manual), [id number]- Vasopneumatic device, [id number]- Ultrasound, [id number]- Ionotophoresis 4mg /ml Dexamethasone , [id number] (1-2 muscles), [id number] (3+ muscles)- Dry Needling, Patient/Family education, Balance training, Stair training, Taping, Joint mobilization, DME instructions, Cryotherapy, and Moist heat  PLAN FOR NEXT SESSION: check results of imaging after 1/11; Review and progress HEP, ROM, strengthening, gait, balance, manual/DN to medial gastroc/soleus, modalities prn    Glade Pesa, PT 05/29/2024 4:11 PM Phone: [phone] Fax: [phone]  "

## 2024-06-02 ENCOUNTER — Other Ambulatory Visit: Payer: Self-pay | Admitting: Adult Health

## 2024-06-02 DIAGNOSIS — G43711 Chronic migraine without aura, intractable, with status migrainosus: Secondary | ICD-10-CM

## 2024-06-05 ENCOUNTER — Ambulatory Visit: Admitting: Physical Therapy

## 2024-06-05 ENCOUNTER — Encounter: Payer: Self-pay | Admitting: Physical Therapy

## 2024-06-05 DIAGNOSIS — R2689 Other abnormalities of gait and mobility: Secondary | ICD-10-CM

## 2024-06-05 DIAGNOSIS — S86111D Strain of other muscle(s) and tendon(s) of posterior muscle group at lower leg level, right leg, subsequent encounter: Secondary | ICD-10-CM | POA: Diagnosis not present

## 2024-06-05 DIAGNOSIS — M79661 Pain in right lower leg: Secondary | ICD-10-CM

## 2024-06-05 DIAGNOSIS — M25671 Stiffness of right ankle, not elsewhere classified: Secondary | ICD-10-CM

## 2024-06-05 NOTE — Therapy (Signed)
 " OUTPATIENT PHYSICAL THERAPY LOWER EXTREMITY PROGRESS NOTE   Patient Name: Terry Shannon MRN: [id number] DOB:19-Oct-1955, 69 y.o., female Today's Date: 06/05/2024  END OF SESSION:  PT End of Session - 06/05/24 1529     Visit Number 3    Date for Recertification  07/22/24    Authorization Type MCR    Progress Note Due on Visit 10    PT Start Time 1530    PT Stop Time 1612    PT Time Calculation (min) 42 min    Activity Tolerance Patient tolerated treatment well          Past Medical History:  Diagnosis Date   Allergy     year-round, pt. 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   right   Complication of anesthesia    slow to wake up after gallbladder surgery   Depression    Eczema    ARMS AND HANDS   GERD (gastroesophageal reflux disease)    History of thyroid  cancer    Hypothyroidism    Migraines    Osteoarthritis    Overactive bladder    RA (rheumatoid arthritis) (HCC)    Sleep apnea    no CPAP use   Squamous cell carcinoma of skin 12/21/2017   in situ-mid chest ([id number])   Ulcerative colitis (HCC)    Past Surgical History:  Procedure Laterality Date   ABDOMINAL HERNIA REPAIR  03/13/2008   periumbilical ventral hernia/incisional hernia   BUNIONECTOMY Right    x 2 more   BUNIONECTOMY Left    BUNIONECTOMY WITH CHILECTOMY Right 12/16/2005   CARPAL TUNNEL RELEASE Right 07/13/2001   CARPAL TUNNEL RELEASE Left 08/10/2001   DILATION AND CURETTAGE OF UTERUS     HAMMER TOE SURGERY Left may 2014   HYSTEROSCOPY WITH D & C  03/01/2004   with exc. of endometrial polyp   KNEE ARTHROSCOPY Right 2013   LAPAROSCOPIC CHOLECYSTECTOMY  11/03/2006   LUMBAR FUSION  03/06/2014   l4  l5    nerve ablation lumbar      THYROIDECTOMY, PARTIAL Right prior to 2002   THYROIDECTOMY, PARTIAL Left 11/29/2000   and isthmus   TONSILLECTOMY  1961   TOTAL ANKLE REPLACEMENT Left 11/2016   with tendon repair   TOTAL KNEE ARTHROPLASTY Right 04/01/2013    Procedure: RIGHT TOTAL KNEE ARTHROPLASTY;  Surgeon: [name], MD;  Location: [facility] OR;  Service: Orthopedics;  Laterality: Right;   Patient Active Problem List   Diagnosis Date Noted   Asthmatic bronchitis, mild persistent, uncomplicated 01/27/2022   Upper airway cough syndrome 01/27/2022   OSA on CPAP 04/27/2021   Intolerance of continuous positive airway pressure (CPAP) ventilation 06/17/2020   OSA (obstructive sleep apnea) 05/06/2020   Snoring 05/06/2020   Insomnia secondary to chronic pain 03/24/2020   History of sleep apnea 03/24/2020   Gasping for breath 03/24/2020   Abnormal glucose level 12/16/2019   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   Degeneration of lumbar intervertebral disc 12/16/2019   Hyperlipidemia 12/16/2019   Hypothyroidism 12/16/2019   Insomnia 12/16/2019   Migraine 12/16/2019   Overweight 12/16/2019   Postoperative hypothyroidism 12/16/2019   Vitamin D  deficiency 12/16/2019   Hallux rigidus, right foot 12/05/2019   Hammer toe of right foot 12/05/2019   History of left ankle joint replacement 12/05/2019   Aftercare following ankle joint replacement surgery 01/10/2017   Anxiety, mild 12/12/2016   Atypical chest pain 12/12/2016   Cardiac murmur 12/12/2016  GERD (gastroesophageal reflux disease) 12/12/2016   History of thyroid  cancer 12/12/2016   Post-traumatic osteoarthritis of left ankle 10/03/2016   Chronic migraine without aura, with intractable migraine, so stated, with status migrainosus 09/26/2015   Joint pain 02/18/2015   Abnormal C-reactive protein 01/08/2015   Radiculopathy 03/06/2014   Osteoarthritis of right knee 04/01/2013   OA (osteoarthritis) 11/01/2011   OAB (overactive bladder)    Ulcerative colitis (HCC)    Cancer (HCC)    Carpal tunnel syndrome    RA (rheumatoid arthritis) (HCC)     PCP: Theo Iha, MD   REFERRING PROVIDER: Irving Cookey MD  REFERRING DIAG: S86.111D (ICD-10-CM) - Strain of other muscle(s) and  tendon(s) of posterior muscle group at lower leg level, right leg, subsequent encounter    THERAPY DIAG:  Pain in right lower leg  Stiffness of right ankle, not elsewhere classified  Other abnormalities of gait and mobility  Rationale for Evaluation and Treatment: Rehabilitation  ONSET DATE: December 2025  SUBJECTIVE:   SUBJECTIVE STATEMENT: Had the MRI and gets results tomorrow.  Presents without the cane and states she's not using it at home.  Standing ex's causing irritation. Didn't have to take aspirin  after last time.    No changes since last visit. Using SPC 50/50 in house and when in community.  I had to take aspirin  after last time.    MRI scheduled 06/03/23. Using SPC.    PERTINENT HISTORY: R gastrocnemius tendon tear,  R TKA, vocal cord surgery, ankle replacement L, HOH, OA, RA, lumbar fusion PAIN:  Are you having pain? Yes: NPRS scale: 0 Pain location: R calf Pain description: unsure Aggravating factors: prolonged activity or pressure on it Relieving factors: aspirin , ice and elevation, compression stocking  PRECAUTIONS: None  RED FLAGS: None   WEIGHT BEARING RESTRICTIONS: no   FALLS:  Has patient fallen in last 6 months? No  LIVING ENVIRONMENT: Lives with: lives with their family Lives in: House/apartment Stairs: Yes: External: 6 steps; can reach both Has following equipment at home: Single point cane and Walker - 2 wheeled  OCCUPATION: retired  PLOF: Independent  PATIENT GOALS: get to where I can walk without a cane and drive again  NEXT MD VISIT: seeing Dr. Yvone 06/07/23 after MRI 1/11  OBJECTIVE:  Note: Objective measures were completed at Evaluation unless otherwise noted.  DIAGNOSTIC FINDINGS: gastroc tendon tear; popliteal cyst, negative DVT  PATIENT SURVEYS:  LEFS 26 / 80 = 32.5 %  COGNITION: Overall cognitive status: Within functional limits for tasks assessed     SENSATION: WFL  EDEMA:  Managed with compression stocking    MUSCLE LENGTH: Tight R gastroc  POSTURE: rounded shoulders, forward head, and weight shift left  PALPATION: Palpation: TTP at R medial gastroc, achilles tendon but not at insertion. Increased tissue tension in medial gastroc  LOWER EXTREMITY ROM: WFL for tasks assessed  Active ROM Right eval Left eval  Hip flexion    Hip extension    Hip abduction    Hip adduction    Hip internal rotation    Hip external rotation    Knee flexion 125   Knee extension 0   Ankle dorsiflexion -4/3 with knee straight; 5/10 deg with knee bent   Ankle plantarflexion    Ankle inversion    Ankle eversion     (Blank rows = not tested)  LOWER EXTREMITY MMT:  MMT Right eval Left eval  Hip flexion 4+ 4  Hip extension    Hip abduction  Hip adduction    Hip internal rotation    Hip external rotation    Knee flexion    Knee extension 5   Ankle dorsiflexion 5   Ankle plantarflexion    Ankle inversion 5   Ankle eversion 5    (Blank rows = not tested)  FUNCTIONAL TESTS:  5 times sit to stand: 13.35 seconds no UE support shifts L Timed up and go (TUG): 12.29 sec   GAIT: Distance walked: 20 Assistive device utilized: Single point cane and None Level of assistance: Modified independence Comments: with cane and without decreased stance                                                                                                                                 TREATMENT DATE:   06/05/2024 Review of HEP Seated red band: plantarflexion 10x (Added to HEP- see below) Seated red band: dorsiflexion band anchored under left foot 10x (Added to HEP- see below) Seated red band: eversion 10x (Added to HEP- see below)  Seated red band: inversion 10x therapist holding (Added to HEP- see below) Seated red band: Plantarflexion with inversion 10x (Added to HEP- see below) Nu-Step L1 seat 7, arms 8 blue machine 10 min   05/29/2024 Review of initial HEP: doing 3x/day (Discussed decreasing to 1 set  3x/day instead of 3 sets) Seated towel stretch (gentle) for gastroc 3 x30 sec  Seated HS stretch with heel on step stool 3x 30 sec  Towel scrunches 10x (Added to HEP- see below) Arch doming 10x(Added to HEP- see below) SLS on right 3x Standing WB on right with 8 inch stool taps 10x (Added to HEP- see below) Isometric heel raise and hold 10 sec 4x (Added to HEP- see below) Cold pack to medial calf with elevation for pain and edema control     05/27/24  See pt ed and HEP   PATIENT EDUCATION:  Education details: PT eval findings, anticipated POC, initial HEP, and discussion of use of compression stocking   Person educated: Patient Education method: Explanation, Demonstration, Tactile cues, Verbal cues, and Handouts Education comprehension: verbalized understanding and returned demonstration  HOME EXERCISE PROGRAM: Access Code: [email or web address] URL: [email or web address] Date: 06/05/2024 Prepared by: Glade Pesa  Exercises - Seated Ankle Circles  - 3 x daily - 7 x weekly - 1 sets - 10 reps - Seated Ankle Alphabet  - 3 x daily - 7 x weekly - 1 sets - 2 reps - Seated Heel Raise  - 3 x daily - 7 x weekly - 1-2 sets - 10 reps - Seated Toe Raise  - 3 x daily - 7 x weekly - 1-2 sets - 10 reps - Seated Ankle Inversion AROM  - 3 x daily - 7 x weekly - 1 sets - 10 reps - Seated Ankle Eversion AROM  - 1 x daily - 3 x weekly - 1 sets - 10 reps - Long Sitting Calf  Stretch with Strap (Mirrored)  - 2 x daily - 7 x weekly - 1 sets - 3 reps - 30 sec hold - Seated Hamstring Stretch  - 1 x daily - 7 x weekly - 1 sets - 3 reps - 30 hold - Seated Toe Towel Scrunches  - 1 x daily - 7 x weekly - 1 sets - 10 reps - Seated Arch Lifts  - 1 x daily - 7 x weekly - 1 sets - 10 reps - Forward Step Touch (Mirrored)  - 1 x daily - 7 x weekly - 1 sets - 10 reps - Isometric Heel Raise at Wall  - 1 x daily - 7 x weekly - 1 sets - 3-5 reps - 10 hold - Seated Ankle Plantarflexion with Resistance  - 7 x weekly  - 1 sets - 10 reps - Seated Ankle Dorsiflexion with Resistance  - 1-3 x daily - 7 x weekly - 1 sets - 10 reps - Seated Ankle Eversion with Resistance  - 1-3 x daily - 7 x weekly - 1 sets - 10 reps - Seated Ankle Inversion with Resistance  - 1-3 x daily - 7 x weekly - 1 sets - 10 reps  ASSESSMENT:  CLINICAL IMPRESSION: Terry Shannon is able to ambulate without an assistive device.  She demonstrates good compliance with her HEP although she did have increased pain with standing heel raise isometrics and single leg standing ex's.  Added seated resisted band ex's to HEP for ankle strengthening.  No pain reported during session.  Therapist monitoring response throughout session.       EVAL: Patient is a 69 y.o. female who was seen today for physical therapy evaluation and treatment for R lower leg pain secondary to a suspected gastrocnemius tendon tear. She presents with a SPC and decreased stance time on the R LE. She demonstrates deficits in R ankle DF and strength, gastrocnemius flexibility and her gait. Her LEFS score indicates only 32% funcion. Pain and deficits affect her ability to walk without an AD, perform her normal chores and drive. She will benefit from skilled PT to address these deficits and those listed below.  She has an MRI scheduled on 06/02/24. .   OBJECTIVE IMPAIRMENTS: Abnormal gait, decreased activity tolerance, decreased balance, decreased knowledge of condition, difficulty walking, decreased ROM, decreased strength, increased edema, increased muscle spasms, impaired flexibility, postural dysfunction, and pain.   ACTIVITY LIMITATIONS: carrying, lifting, standing, squatting, stairs, bathing, dressing, hygiene/grooming, and locomotion level  PARTICIPATION LIMITATIONS: meal prep, cleaning, laundry, driving, shopping, community activity, and yard work  PERSONAL FACTORS: Age and 3+ comorbidities:  R TKA,  ankle replacement L, HOH, OA, RA, lumbar fusion are also affecting patient's  functional outcome.   REHAB POTENTIAL: Excellent  CLINICAL DECISION MAKING: Evolving/moderate complexity  EVALUATION COMPLEXITY: Low   GOALS: Goals reviewed with patient? Yes  SHORT TERM GOALS: Target date: 06/24/2024    Ind with initial HEP Baseline: Goal status: INITIAL  2.  Able to safely ambulate without AD Baseline:  Goal status: INITIAL  3.  Decreased R calf pain by 25% with ADLs and when resting leg on bed or ottoman. Baseline:  Goal status: INITIAL   LONG TERM GOALS: Target date: 07/22/2024   Ind with advanced HEP and its progression Baseline:  Goal status: INITIAL  2. Improved R ankle DF to 5 - 10  deg to normalize gait and functional mobility Baseline:  Goal status: INITIAL  3.  Able to perform R single leg heel  raise showing improved plantar flexion strength. Baseline:  Goal status: INITIAL  4.  Able to climb stairs with a reciprocal gait pattern Baseline:  Goal status: INITIAL  5.  Patient able to drive without pain or difficulty. Baseline:  Goal status: INITIAL  6.  Pt able to safely amb 600 ft in 6 minutes to allow access to community. Baseline:  Goal status: INITIAL  7.  Improved LEFS to 50/80 or better showing functional improvement Baseline: 26/80 Goal status:INITIAL    PLAN:  PT FREQUENCY: 2x/week  PT DURATION: 8 weeks  PLANNED INTERVENTIONS: 97110-Therapeutic exercises, 97530- Therapeutic activity, 97112- Neuromuscular re-education, 97535- Self Care, [id number]- Manual therapy, [id number]- Gait training, [phone]- Aquatic Therapy, [phone]- Electrical stimulation (unattended), [phone]- Electrical stimulation (manual), [id number]- Vasopneumatic device, [id number]- Ultrasound, [id number]- Ionotophoresis 4mg /ml Dexamethasone , 79439 (1-2 muscles), [id number] (3+ muscles)- Dry Needling, Patient/Family education, Balance training, Stair training, Taping, Joint mobilization, DME instructions, Cryotherapy, and Moist heat  PLAN FOR NEXT SESSION: check results of imaging after her  follow up with Dr. Yvone; Review and progress HEP, ROM, strengthening, gait, balance, manual/DN to medial gastroc/soleus, modalities prn    Glade Pesa, PT 06/05/2024 4:28 PM Phone: [phone] Fax: [phone]  "

## 2024-06-10 ENCOUNTER — Ambulatory Visit

## 2024-06-10 DIAGNOSIS — R2689 Other abnormalities of gait and mobility: Secondary | ICD-10-CM

## 2024-06-10 DIAGNOSIS — M25671 Stiffness of right ankle, not elsewhere classified: Secondary | ICD-10-CM

## 2024-06-10 DIAGNOSIS — S86111D Strain of other muscle(s) and tendon(s) of posterior muscle group at lower leg level, right leg, subsequent encounter: Secondary | ICD-10-CM | POA: Diagnosis not present

## 2024-06-10 DIAGNOSIS — R252 Cramp and spasm: Secondary | ICD-10-CM

## 2024-06-10 DIAGNOSIS — M79661 Pain in right lower leg: Secondary | ICD-10-CM

## 2024-06-10 NOTE — Therapy (Signed)
 " OUTPATIENT PHYSICAL THERAPY LOWER EXTREMITY PROGRESS NOTE   Patient Name: Naquisha Whitehair MRN: [id number] DOB:July 12, 1955, 69 y.o., female Today's Date: 06/10/2024  END OF SESSION:  PT End of Session - 06/10/24 1445     Visit Number 4    Date for Recertification  07/22/24    Authorization Type MCR    Progress Note Due on Visit 10    PT Start Time 1401    PT Stop Time 1445    PT Time Calculation (min) 44 min    Activity Tolerance Patient tolerated treatment well    Behavior During Therapy WFL for tasks assessed/performed           Past Medical History:  Diagnosis Date   Allergy     year-round, pt. 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   right   Complication of anesthesia    slow to wake up after gallbladder surgery   Depression    Eczema    ARMS AND HANDS   GERD (gastroesophageal reflux disease)    History of thyroid  cancer    Hypothyroidism    Migraines    Osteoarthritis    Overactive bladder    RA (rheumatoid arthritis) (HCC)    Sleep apnea    no CPAP use   Squamous cell carcinoma of skin 12/21/2017   in situ-mid chest ([id number])   Ulcerative colitis (HCC)    Past Surgical History:  Procedure Laterality Date   ABDOMINAL HERNIA REPAIR  03/13/2008   periumbilical ventral hernia/incisional hernia   BUNIONECTOMY Right    x 2 more   BUNIONECTOMY Left    BUNIONECTOMY WITH CHILECTOMY Right 12/16/2005   CARPAL TUNNEL RELEASE Right 07/13/2001   CARPAL TUNNEL RELEASE Left 08/10/2001   DILATION AND CURETTAGE OF UTERUS     HAMMER TOE SURGERY Left may 2014   HYSTEROSCOPY WITH D & C  03/01/2004   with exc. of endometrial polyp   KNEE ARTHROSCOPY Right 2013   LAPAROSCOPIC CHOLECYSTECTOMY  11/03/2006   LUMBAR FUSION  03/06/2014   l4  l5    nerve ablation lumbar      THYROIDECTOMY, PARTIAL Right prior to 2002   THYROIDECTOMY, PARTIAL Left 11/29/2000   and isthmus   TONSILLECTOMY  1961   TOTAL ANKLE REPLACEMENT Left 11/2016   with  tendon repair   TOTAL KNEE ARTHROPLASTY Right 04/01/2013   Procedure: RIGHT TOTAL KNEE ARTHROPLASTY;  Surgeon: [name], MD;  Location: [facility] OR;  Service: Orthopedics;  Laterality: Right;   Patient Active Problem List   Diagnosis Date Noted   Asthmatic bronchitis, mild persistent, uncomplicated 01/27/2022   Upper airway cough syndrome 01/27/2022   OSA on CPAP 04/27/2021   Intolerance of continuous positive airway pressure (CPAP) ventilation 06/17/2020   OSA (obstructive sleep apnea) 05/06/2020   Snoring 05/06/2020   Insomnia secondary to chronic pain 03/24/2020   History of sleep apnea 03/24/2020   Gasping for breath 03/24/2020   Abnormal glucose level 12/16/2019   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   Degeneration of lumbar intervertebral disc 12/16/2019   Hyperlipidemia 12/16/2019   Hypothyroidism 12/16/2019   Insomnia 12/16/2019   Migraine 12/16/2019   Overweight 12/16/2019   Postoperative hypothyroidism 12/16/2019   Vitamin D  deficiency 12/16/2019   Hallux rigidus, right foot 12/05/2019   Hammer toe of right foot 12/05/2019   History of left ankle joint replacement 12/05/2019   Aftercare following ankle joint replacement surgery 01/10/2017   Anxiety, mild 12/12/2016  Atypical chest pain 12/12/2016   Cardiac murmur 12/12/2016   GERD (gastroesophageal reflux disease) 12/12/2016   History of thyroid  cancer 12/12/2016   Post-traumatic osteoarthritis of left ankle 10/03/2016   Chronic migraine without aura, with intractable migraine, so stated, with status migrainosus 09/26/2015   Joint pain 02/18/2015   Abnormal C-reactive protein 01/08/2015   Radiculopathy 03/06/2014   Osteoarthritis of right knee 04/01/2013   OA (osteoarthritis) 11/01/2011   OAB (overactive bladder)    Ulcerative colitis (HCC)    Cancer (HCC)    Carpal tunnel syndrome    RA (rheumatoid arthritis) (HCC)     PCP: Theo Iha, MD   REFERRING PROVIDER: Irving Cookey MD  REFERRING  DIAG: S86.111D (ICD-10-CM) - Strain of other muscle(s) and tendon(s) of posterior muscle group at lower leg level, right leg, subsequent encounter    THERAPY DIAG:  Pain in right lower leg  Stiffness of right ankle, not elsewhere classified  Other abnormalities of gait and mobility  Cramp and spasm  Rationale for Evaluation and Treatment: Rehabilitation  ONSET DATE: December 2025  SUBJECTIVE:   SUBJECTIVE STATEMENT: No issue with the knee on MRI.  They tried to drain fluid at my ankle and they couldn't get anything.  They told me to wear the compression stockings.    No changes since last visit. Using SPC 50/50 in house and when in community.  I had to take aspirin  after last time.    MRI scheduled 06/03/23. Using SPC.    PERTINENT HISTORY: R gastrocnemius tendon tear,  R TKA, vocal cord surgery, ankle replacement L, HOH, OA, RA, lumbar fusion PAIN: 06/10/24 Are you having pain? Yes: NPRS scale: 3/10 Pain location: R calf Pain description: unsure Aggravating factors: prolonged activity or pressure on it Relieving factors: aspirin , ice and elevation, compression stocking  PRECAUTIONS: None  RED FLAGS: None   WEIGHT BEARING RESTRICTIONS: no   FALLS:  Has patient fallen in last 6 months? No  LIVING ENVIRONMENT: Lives with: lives with their family Lives in: House/apartment Stairs: Yes: External: 6 steps; can reach both Has following equipment at home: Single point cane and Walker - 2 wheeled  OCCUPATION: retired  PLOF: Independent  PATIENT GOALS: get to where I can walk without a cane and drive again  NEXT MD VISIT: seeing Dr. Yvone 06/07/23 after MRI 1/11  OBJECTIVE:  Note: Objective measures were completed at Evaluation unless otherwise noted.  DIAGNOSTIC FINDINGS: gastroc tendon tear; popliteal cyst, negative DVT  PATIENT SURVEYS:  LEFS 26 / 80 = 32.5 %  COGNITION: Overall cognitive status: Within functional limits for tasks  assessed     SENSATION: WFL  EDEMA:  Managed with compression stocking   MUSCLE LENGTH: Tight R gastroc  POSTURE: rounded shoulders, forward head, and weight shift left  PALPATION: Palpation: TTP at R medial gastroc, achilles tendon but not at insertion. Increased tissue tension in medial gastroc  LOWER EXTREMITY ROM: WFL for tasks assessed  Active ROM Right eval Left eval  Hip flexion    Hip extension    Hip abduction    Hip adduction    Hip internal rotation    Hip external rotation    Knee flexion 125   Knee extension 0   Ankle dorsiflexion -4/3 with knee straight; 5/10 deg with knee bent   Ankle plantarflexion    Ankle inversion    Ankle eversion     (Blank rows = not tested)  LOWER EXTREMITY MMT:  MMT Right eval Left eval  Hip  flexion 4+ 4  Hip extension    Hip abduction    Hip adduction    Hip internal rotation    Hip external rotation    Knee flexion    Knee extension 5   Ankle dorsiflexion 5   Ankle plantarflexion    Ankle inversion 5   Ankle eversion 5    (Blank rows = not tested)  FUNCTIONAL TESTS:  5 times sit to stand: 13.35 seconds no UE support shifts L Timed up and go (TUG): 12.29 sec   GAIT: Distance walked: 20 Assistive device utilized: Single point cane and None Level of assistance: Modified independence Comments: with cane and without decreased stance                                                                                                                                 TREATMENT DATE:   06/10/2024 Nu-Step L5 seat 7, arms 8 green machine 10 min- PT present to discuss progress Standing rockerboard x 3 minutes  Seated red band: plantarflexion 2x10 Seated red band: dorsiflexion band anchored under left foot 2x10 Seated red band: eversion 2x10 Seated red band: inversion 10x therapist holding 2x10 Seated red band: Plantarflexion with inversion 2x10 Seated gastroc stretch with strap 3x20 seconds  Weight shift on foam pad:  3 ways x 1 min each Stand on Rt and tap on 8 step with Lt 2x10 with intermittent UE support required for balance Seated Baps: level 3 x 1 min each direction    06/05/2024 Review of HEP Seated red band: plantarflexion 10x (Added to HEP- see below) Seated red band: dorsiflexion band anchored under left foot 10x (Added to HEP- see below) Seated red band: eversion 10x (Added to HEP- see below)  Seated red band: inversion 10x therapist holding (Added to HEP- see below) Seated red band: Plantarflexion with inversion 10x (Added to HEP- see below) Nu-Step L1 seat 7, arms 8 blue machine 10 min   05/29/2024 Review of initial HEP: doing 3x/day (Discussed decreasing to 1 set 3x/day instead of 3 sets) Seated towel stretch (gentle) for gastroc 3 x30 sec  Seated HS stretch with heel on step stool 3x 30 sec  Towel scrunches 10x (Added to HEP- see below) Arch doming 10x(Added to HEP- see below) SLS on right 3x Standing WB on right with 8 inch stool taps 10x (Added to HEP- see below) Isometric heel raise and hold 10 sec 4x (Added to HEP- see below) Cold pack to medial calf with elevation for pain and edema control    PATIENT EDUCATION:  Education details: PT eval findings, anticipated POC, initial HEP, and discussion of use of compression stocking   Person educated: Patient Education method: Explanation, Demonstration, Tactile cues, Verbal cues, and Handouts Education comprehension: verbalized understanding and returned demonstration  HOME EXERCISE PROGRAM: Access Code: [email or web address] URL: [email or web address] Date: 06/05/2024 Prepared by: Glade Pesa  Exercises - Seated Ankle Circles  - 3 x daily -  7 x weekly - 1 sets - 10 reps - Seated Ankle Alphabet  - 3 x daily - 7 x weekly - 1 sets - 2 reps - Seated Heel Raise  - 3 x daily - 7 x weekly - 1-2 sets - 10 reps - Seated Toe Raise  - 3 x daily - 7 x weekly - 1-2 sets - 10 reps - Seated Ankle Inversion AROM  - 3 x daily - 7 x weekly  - 1 sets - 10 reps - Seated Ankle Eversion AROM  - 1 x daily - 3 x weekly - 1 sets - 10 reps - Long Sitting Calf Stretch with Strap (Mirrored)  - 2 x daily - 7 x weekly - 1 sets - 3 reps - 30 sec hold - Seated Hamstring Stretch  - 1 x daily - 7 x weekly - 1 sets - 3 reps - 30 hold - Seated Toe Towel Scrunches  - 1 x daily - 7 x weekly - 1 sets - 10 reps - Seated Arch Lifts  - 1 x daily - 7 x weekly - 1 sets - 10 reps - Forward Step Touch (Mirrored)  - 1 x daily - 7 x weekly - 1 sets - 10 reps - Isometric Heel Raise at Wall  - 1 x daily - 7 x weekly - 1 sets - 3-5 reps - 10 hold - Seated Ankle Plantarflexion with Resistance  - 7 x weekly - 1 sets - 10 reps - Seated Ankle Dorsiflexion with Resistance  - 1-3 x daily - 7 x weekly - 1 sets - 10 reps - Seated Ankle Eversion with Resistance  - 1-3 x daily - 7 x weekly - 1 sets - 10 reps - Seated Ankle Inversion with Resistance  - 1-3 x daily - 7 x weekly - 1 sets - 10 reps  ASSESSMENT:  CLINICAL IMPRESSION: [place] continues to be able to ambulate without an assistive device.  She is compliant with HEP and denies any additional pain with this.  She uses compression stocking and ice to manage edema.  MRI was negative for any damage per pt report. Challenged by seated Baps board but able to complete with good form.  Therapist monitoring response throughout session.  Patient will benefit from skilled PT to address the below impairments and improve overall function.      EVAL: Patient is a 69 y.o. female who was seen today for physical therapy evaluation and treatment for R lower leg pain secondary to a suspected gastrocnemius tendon tear. She presents with a SPC and decreased stance time on the R LE. She demonstrates deficits in R ankle DF and strength, gastrocnemius flexibility and her gait. Her LEFS score indicates only 32% funcion. Pain and deficits affect her ability to walk without an AD, perform her normal chores and drive. She will benefit from  skilled PT to address these deficits and those listed below.  She has an MRI scheduled on 06/02/24. .   OBJECTIVE IMPAIRMENTS: Abnormal gait, decreased activity tolerance, decreased balance, decreased knowledge of condition, difficulty walking, decreased ROM, decreased strength, increased edema, increased muscle spasms, impaired flexibility, postural dysfunction, and pain.   ACTIVITY LIMITATIONS: carrying, lifting, standing, squatting, stairs, bathing, dressing, hygiene/grooming, and locomotion level  PARTICIPATION LIMITATIONS: meal prep, cleaning, laundry, driving, shopping, community activity, and yard work  PERSONAL FACTORS: Age and 3+ comorbidities:  R TKA,  ankle replacement L, HOH, OA, RA, lumbar fusion are also affecting patient's functional outcome.   REHAB  POTENTIAL: Excellent  CLINICAL DECISION MAKING: Evolving/moderate complexity  EVALUATION COMPLEXITY: Low   GOALS: Goals reviewed with patient? Yes  SHORT TERM GOALS: Target date: 06/24/2024    Ind with initial HEP Baseline:  Goal status: In progress   2.  Able to safely ambulate without AD Baseline: no longer using cane (06/10/24) Goal status: MET  3.  Decreased R calf pain by 25% with ADLs and when resting leg on bed or ottoman. Baseline:  Goal status: INITIAL   LONG TERM GOALS: Target date: 07/22/2024   Ind with advanced HEP and its progression Baseline:  Goal status: INITIAL  2. Improved R ankle DF to 5 - 10  deg to normalize gait and functional mobility Baseline:  Goal status: INITIAL  3.  Able to perform R single leg heel raise showing improved plantar flexion strength. Baseline:  Goal status: INITIAL  4.  Able to climb stairs with a reciprocal gait pattern Baseline:  Goal status: INITIAL  5.  Patient able to drive without pain or difficulty. Baseline: driving now and no pain at the time, pain later (06/10/24) Goal status: In progress   6.  Pt able to safely amb 600 ft in 6 minutes to allow access to  community. Baseline:  Goal status: INITIAL  7.  Improved LEFS to 50/80 or better showing functional improvement Baseline: 26/80 Goal status:INITIAL    PLAN:  PT FREQUENCY: 2x/week  PT DURATION: 8 weeks  PLANNED INTERVENTIONS: 97110-Therapeutic exercises, 97530- Therapeutic activity, 97112- Neuromuscular re-education, 97535- Self Care, [id number]- Manual therapy, [phone]- Gait training, [phone]- Aquatic Therapy, [phone]- Electrical stimulation (unattended), [phone]- Electrical stimulation (manual), [id number]- Vasopneumatic device, [id number]- Ultrasound, [id number]- Ionotophoresis 4mg /ml Dexamethasone , [id number] (1-2 muscles), [id number] (3+ muscles)- Dry Needling, Patient/Family education, Balance training, Stair training, Taping, Joint mobilization, DME instructions, Cryotherapy, and Moist heat  PLAN FOR NEXT SESSION: 6 min walk test, Review and progress HEP, ROM, strengthening, gait, balance, manual/DN to medial gastroc/soleus, modalities prn    Burnard Joy, PT 06/10/24 2:47 PM   "

## 2024-06-11 ENCOUNTER — Ambulatory Visit (INDEPENDENT_AMBULATORY_CARE_PROVIDER_SITE_OTHER)

## 2024-06-12 ENCOUNTER — Ambulatory Visit: Admitting: Physical Therapy

## 2024-06-12 ENCOUNTER — Encounter: Payer: Self-pay | Admitting: Physical Therapy

## 2024-06-12 DIAGNOSIS — M25671 Stiffness of right ankle, not elsewhere classified: Secondary | ICD-10-CM

## 2024-06-12 DIAGNOSIS — R2689 Other abnormalities of gait and mobility: Secondary | ICD-10-CM

## 2024-06-12 DIAGNOSIS — S86111D Strain of other muscle(s) and tendon(s) of posterior muscle group at lower leg level, right leg, subsequent encounter: Secondary | ICD-10-CM | POA: Diagnosis not present

## 2024-06-12 DIAGNOSIS — M79661 Pain in right lower leg: Secondary | ICD-10-CM

## 2024-06-12 NOTE — Therapy (Signed)
 " OUTPATIENT PHYSICAL THERAPY LOWER EXTREMITY PROGRESS NOTE   Patient Name: Terry Shannon MRN: [id number] DOB:Oct 26, 1955, 69 y.o., female Today's Date: 06/12/2024  END OF SESSION:  PT End of Session - 06/12/24 1444     Visit Number 5    Date for Recertification  07/22/24    Authorization Type MCR    Progress Note Due on Visit 10    PT Start Time 1446    PT Stop Time 1530    PT Time Calculation (min) 44 min    Activity Tolerance Patient tolerated treatment well           Past Medical History:  Diagnosis Date   Allergy     year-round, pt. 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   right   Complication of anesthesia    slow to wake up after gallbladder surgery   Depression    Eczema    ARMS AND HANDS   GERD (gastroesophageal reflux disease)    History of thyroid  cancer    Hypothyroidism    Migraines    Osteoarthritis    Overactive bladder    RA (rheumatoid arthritis) (HCC)    Sleep apnea    no CPAP use   Squamous cell carcinoma of skin 12/21/2017   in situ-mid chest ([id number])   Ulcerative colitis (HCC)    Past Surgical History:  Procedure Laterality Date   ABDOMINAL HERNIA REPAIR  03/13/2008   periumbilical ventral hernia/incisional hernia   BUNIONECTOMY Right    x 2 more   BUNIONECTOMY Left    BUNIONECTOMY WITH CHILECTOMY Right 12/16/2005   CARPAL TUNNEL RELEASE Right 07/13/2001   CARPAL TUNNEL RELEASE Left 08/10/2001   DILATION AND CURETTAGE OF UTERUS     HAMMER TOE SURGERY Left may 2014   HYSTEROSCOPY WITH D & C  03/01/2004   with exc. of endometrial polyp   KNEE ARTHROSCOPY Right 2013   LAPAROSCOPIC CHOLECYSTECTOMY  11/03/2006   LUMBAR FUSION  03/06/2014   l4  l5    nerve ablation lumbar      THYROIDECTOMY, PARTIAL Right prior to 2002   THYROIDECTOMY, PARTIAL Left 11/29/2000   and isthmus   TONSILLECTOMY  1961   TOTAL ANKLE REPLACEMENT Left 11/2016   with tendon repair   TOTAL KNEE ARTHROPLASTY Right 04/01/2013    Procedure: RIGHT TOTAL KNEE ARTHROPLASTY;  Surgeon: Terry LITTIE Gavel, MD;  Location: [facility] OR;  Service: Orthopedics;  Laterality: Right;   Patient Active Problem List   Diagnosis Date Noted   Asthmatic bronchitis, mild persistent, uncomplicated 01/27/2022   Upper airway cough syndrome 01/27/2022   OSA on CPAP 04/27/2021   Intolerance of continuous positive airway pressure (CPAP) ventilation 06/17/2020   OSA (obstructive sleep apnea) 05/06/2020   Snoring 05/06/2020   Insomnia secondary to chronic pain 03/24/2020   History of sleep apnea 03/24/2020   Gasping for breath 03/24/2020   Abnormal glucose level 12/16/2019   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   Degeneration of lumbar intervertebral disc 12/16/2019   Hyperlipidemia 12/16/2019   Hypothyroidism 12/16/2019   Insomnia 12/16/2019   Migraine 12/16/2019   Overweight 12/16/2019   Postoperative hypothyroidism 12/16/2019   Vitamin D  deficiency 12/16/2019   Hallux rigidus, right foot 12/05/2019   Hammer toe of right foot 12/05/2019   History of left ankle joint replacement 12/05/2019   Aftercare following ankle joint replacement surgery 01/10/2017   Anxiety, mild 12/12/2016   Atypical chest pain 12/12/2016   Cardiac murmur 12/12/2016  GERD (gastroesophageal reflux disease) 12/12/2016   History of thyroid  cancer 12/12/2016   Post-traumatic osteoarthritis of left ankle 10/03/2016   Chronic migraine without aura, with intractable migraine, so stated, with status migrainosus 09/26/2015   Joint pain 02/18/2015   Abnormal C-reactive protein 01/08/2015   Radiculopathy 03/06/2014   Osteoarthritis of right knee 04/01/2013   OA (osteoarthritis) 11/01/2011   OAB (overactive bladder)    Ulcerative colitis (HCC)    Cancer (HCC)    Carpal tunnel syndrome    RA (rheumatoid arthritis) (HCC)     PCP: Terry Iha, MD   REFERRING PROVIDER: Irving Cookey MD  REFERRING DIAG: S86.111D (ICD-10-CM) - Strain of other muscle(s) and  tendon(s) of posterior muscle group at lower leg level, right leg, subsequent encounter    THERAPY DIAG:  Pain in right lower leg  Stiffness of right ankle, not elsewhere classified  Other abnormalities of gait and mobility  Rationale for Evaluation and Treatment: Rehabilitation  ONSET DATE: December 2025  SUBJECTIVE:   SUBJECTIVE STATEMENT: It was really sore after last time.  My ankle was hurting more.  I've been told I need an ankle replacement.  It was OK by the next day.    No changes since last visit. Using SPC 50/50 in house and when in community.  I had to take aspirin  after last time.    MRI scheduled 06/03/23. Using SPC.    PERTINENT HISTORY: R gastrocnemius tendon tear,  R TKA, vocal cord surgery, ankle replacement L, HOH, OA, RA, lumbar fusion PAIN: 06/12/24 Are you having pain? Yes: NPRS scale: 0/10 Pain location: R calf Pain description: unsure Aggravating factors: prolonged activity or pressure on it Relieving factors: aspirin , ice and elevation, compression stocking  PRECAUTIONS: None  RED FLAGS: None   WEIGHT BEARING RESTRICTIONS: no   FALLS:  Has patient fallen in last 6 months? No  LIVING ENVIRONMENT: Lives with: lives with their family Lives in: House/apartment Stairs: Yes: External: 6 steps; can reach both Has following equipment at home: Single point cane and Walker - 2 wheeled  OCCUPATION: retired  PLOF: Independent  PATIENT GOALS: get to where I can walk without a cane and drive again  NEXT MD VISIT: seeing Dr. Yvone 06/07/23 after MRI 1/11  OBJECTIVE:  Note: Objective measures were completed at Evaluation unless otherwise noted.  DIAGNOSTIC FINDINGS: gastroc tendon tear; popliteal cyst, negative DVT  PATIENT SURVEYS:  LEFS 26 / 80 = 32.5 %  COGNITION: Overall cognitive status: Within functional limits for tasks assessed     SENSATION: WFL  EDEMA:  Managed with compression stocking   MUSCLE LENGTH: Tight R  gastroc  POSTURE: rounded shoulders, forward head, and weight shift left  PALPATION: Palpation: TTP at R medial gastroc, achilles tendon but not at insertion. Increased tissue tension in medial gastroc  LOWER EXTREMITY ROM: WFL for tasks assessed  Active ROM Right eval Left eval  Hip flexion    Hip extension    Hip abduction    Hip adduction    Hip internal rotation    Hip external rotation    Knee flexion 125   Knee extension 0   Ankle dorsiflexion -4/3 with knee straight; 5/10 deg with knee bent   Ankle plantarflexion    Ankle inversion    Ankle eversion     (Blank rows = not tested)  LOWER EXTREMITY MMT:  MMT Right eval Left eval  Hip flexion 4+ 4  Hip extension    Hip abduction    Hip  adduction    Hip internal rotation    Hip external rotation    Knee flexion    Knee extension 5   Ankle dorsiflexion 5   Ankle plantarflexion    Ankle inversion 5   Ankle eversion 5    (Blank rows = not tested)  FUNCTIONAL TESTS:  5 times sit to stand: 13.35 seconds no UE support shifts L Timed up and go (TUG): 12.29 sec   GAIT: Distance walked: 20 Assistive device utilized: Single point cane and None Level of assistance: Modified independence Comments: with cane and without decreased stance                                                                                                                                 TREATMENT DATE:   06/12/2024 Nu-Step L5 seat 7, arms 8 green machine 10 min- PT present to discuss progress including response to treatment  Red band ankle isometrics: red band 20 sec holds 3-5 reps (DF, PF, inv and eversion) Staggered stance sit to stands right leg back (left knee painful) no hands from high table 7x (Added to HEP- see below) Isometric heel raise and holds 20 sec holds 5 reps (Added to HEP- see below) Single leg standing: WB on right with 3 side 3 ways; UE reaches 2 sets of 8 each (Added to HEP- see below)    06/10/2024 Nu-Step L5 seat  7, arms 8 green machine 10 min- PT present to discuss progress Standing rockerboard x 3 minutes  Seated red band: plantarflexion 2x10 Seated red band: dorsiflexion band anchored under left foot 2x10 Seated red band: eversion 2x10 Seated red band: inversion 10x therapist holding 2x10 Seated red band: Plantarflexion with inversion 2x10 Seated gastroc stretch with strap 3x20 seconds  Weight shift on foam pad: 3 ways x 1 min each Stand on Rt and tap on 8 step with Lt 2x10 with intermittent UE support required for balance Seated Baps: level 3 x 1 min each direction    06/05/2024 Review of HEP Seated red band: plantarflexion 10x (Added to HEP- see below) Seated red band: dorsiflexion band anchored under left foot 10x (Added to HEP- see below) Seated red band: eversion 10x (Added to HEP- see below)  Seated red band: inversion 10x therapist holding (Added to HEP- see below) Seated red band: Plantarflexion with inversion 10x (Added to HEP- see below) Nu-Step L1 seat 7, arms 8 blue machine 10 min   05/29/2024 Review of initial HEP: doing 3x/day (Discussed decreasing to 1 set 3x/day instead of 3 sets) Seated towel stretch (gentle) for gastroc 3 x30 sec  Seated HS stretch with heel on step stool 3x 30 sec  Towel scrunches 10x (Added to HEP- see below) Arch doming 10x(Added to HEP- see below) SLS on right 3x Standing WB on right with 8 inch stool taps 10x (Added to HEP- see below) Isometric heel raise and hold 10 sec 4x (Added to HEP-  see below) Cold pack to medial calf with elevation for pain and edema control    PATIENT EDUCATION:  Education details: PT eval findings, anticipated POC, initial HEP, and discussion of use of compression stocking   Person educated: Patient Education method: Explanation, Demonstration, Tactile cues, Verbal cues, and Handouts Education comprehension: verbalized understanding and returned demonstration  HOME EXERCISE PROGRAM: Access Code: [email or web address] URL:  [email or web address] Date: 06/12/2024 Prepared by: Glade Pesa  Exercises - Seated Ankle Circles  - 3 x daily - 7 x weekly - 1 sets - 10 reps - Seated Ankle Alphabet  - 3 x daily - 7 x weekly - 1 sets - 2 reps - Seated Heel Raise  - 3 x daily - 7 x weekly - 1-2 sets - 10 reps - Seated Toe Raise  - 3 x daily - 7 x weekly - 1-2 sets - 10 reps - Seated Ankle Inversion AROM  - 3 x daily - 7 x weekly - 1 sets - 10 reps - Seated Ankle Eversion AROM  - 1 x daily - 3 x weekly - 1 sets - 10 reps - Long Sitting Calf Stretch with Strap (Mirrored)  - 2 x daily - 7 x weekly - 1 sets - 3 reps - 30 sec hold - Seated Hamstring Stretch  - 1 x daily - 7 x weekly - 1 sets - 3 reps - 30 hold - Seated Toe Towel Scrunches  - 1 x daily - 7 x weekly - 1 sets - 10 reps - Seated Arch Lifts  - 1 x daily - 7 x weekly - 1 sets - 10 reps - Forward Step Touch (Mirrored)  - 1 x daily - 7 x weekly - 1 sets - 10 reps - Isometric Heel Raise at Wall  - 1 x daily - 7 x weekly - 1 sets - 3-5 reps - 10 hold - Seated Ankle Plantarflexion with Resistance  - 7 x weekly - 1 sets - 10 reps - Seated Ankle Dorsiflexion with Resistance  - 1-3 x daily - 7 x weekly - 1 sets - 10 reps - Seated Ankle Eversion with Resistance  - 1-3 x daily - 7 x weekly - 1 sets - 10 reps - Seated Ankle Inversion with Resistance  - 1-3 x daily - 7 x weekly - 1 sets - 10 reps - Staggered Sit-to-Stand  - 2 x daily - 7 x weekly - 1 sets - 5 reps - Isometric Heel Raise at Wall  - 1 x daily - 7 x weekly - 1 sets - 3-5 reps - 20 hold - Single Leg Balance with Clock Reach  - 1 x daily - 7 x weekly - 1 sets - 8 reps ASSESSMENT:  CLINICAL IMPRESSION: Modified band ex's to be isometric holds for longer duration/fewer reps secondary to pain sensitivity.  She states she has been recommended to have an ankle replacement on that side so reduced volume with exercises may be less painful on the joints.   Will assess next visit her response with hopefully  less post delayed onset muscle soreness. HEP continues to be updated and progressed.    EVAL: Patient is a 69 y.o. female who was seen today for physical therapy evaluation and treatment for R lower leg pain secondary to a suspected gastrocnemius tendon tear. She presents with a SPC and decreased stance time on the R LE. She demonstrates deficits in R ankle DF and strength, gastrocnemius flexibility and her gait. Her LEFS score  indicates only 32% funcion. Pain and deficits affect her ability to walk without an AD, perform her normal chores and drive. She will benefit from skilled PT to address these deficits and those listed below.  She has an MRI scheduled on 06/02/24. .   OBJECTIVE IMPAIRMENTS: Abnormal gait, decreased activity tolerance, decreased balance, decreased knowledge of condition, difficulty walking, decreased ROM, decreased strength, increased edema, increased muscle spasms, impaired flexibility, postural dysfunction, and pain.   ACTIVITY LIMITATIONS: carrying, lifting, standing, squatting, stairs, bathing, dressing, hygiene/grooming, and locomotion level  PARTICIPATION LIMITATIONS: meal prep, cleaning, laundry, driving, shopping, community activity, and yard work  PERSONAL FACTORS: Age and 3+ comorbidities:  R TKA,  ankle replacement L, HOH, OA, RA, lumbar fusion are also affecting patient's functional outcome.   REHAB POTENTIAL: Excellent  CLINICAL DECISION MAKING: Evolving/moderate complexity  EVALUATION COMPLEXITY: Low   GOALS: Goals reviewed with patient? Yes  SHORT TERM GOALS: Target date: 06/24/2024    Ind with initial HEP Baseline:  Goal status: In progress   2.  Able to safely ambulate without AD Baseline: no longer using cane (06/10/24) Goal status: MET  3.  Decreased R calf pain by 25% with ADLs and when resting leg on bed or ottoman. Baseline:  Goal status: INITIAL   LONG TERM GOALS: Target date: 07/22/2024   Ind with advanced HEP and its  progression Baseline:  Goal status: INITIAL  2. Improved R ankle DF to 5 - 10  deg to normalize gait and functional mobility Baseline:  Goal status: INITIAL  3.  Able to perform R single leg heel raise showing improved plantar flexion strength. Baseline:  Goal status: INITIAL  4.  Able to climb stairs with a reciprocal gait pattern Baseline:  Goal status: INITIAL  5.  Patient able to drive without pain or difficulty. Baseline: driving now and no pain at the time, pain later (06/10/24) Goal status: In progress   6.  Pt able to safely amb 600 ft in 6 minutes to allow access to community. Baseline:  Goal status: INITIAL  7.  Improved LEFS to 50/80 or better showing functional improvement Baseline: 26/80 Goal status:INITIAL    PLAN:  PT FREQUENCY: 2x/week  PT DURATION: 8 weeks  PLANNED INTERVENTIONS: 97110-Therapeutic exercises, 97530- Therapeutic activity, 97112- Neuromuscular re-education, 97535- Self Care, [id number]- Manual therapy, [phone]- Gait training, [phone]- Aquatic Therapy, [phone]- Electrical stimulation (unattended), [phone]- Electrical stimulation (manual), [id number]- Vasopneumatic device, [id number]- Ultrasound, [id number]- Ionotophoresis 4mg /ml Dexamethasone , [id number] (1-2 muscles), [id number] (3+ muscles)- Dry Needling, Patient/Family education, Balance training, Stair training, Taping, Joint mobilization, DME instructions, Cryotherapy, and Moist heat  PLAN FOR NEXT SESSION: ankle isometric style ex's with reduced number of repetitions secondary to pain response;  6 min walk test, Review and progress HEP, ROM, strengthening, gait, balance, manual/DN to medial gastroc/soleus, modalities prn   Glade Pesa, PT 06/12/24 4:40 PM Phone: [phone] Fax: [phone]  "

## 2024-06-17 ENCOUNTER — Ambulatory Visit

## 2024-06-19 ENCOUNTER — Encounter: Payer: Self-pay | Admitting: Physical Therapy

## 2024-06-19 ENCOUNTER — Ambulatory Visit: Admitting: Physical Therapy

## 2024-06-19 DIAGNOSIS — M25671 Stiffness of right ankle, not elsewhere classified: Secondary | ICD-10-CM

## 2024-06-19 DIAGNOSIS — M79661 Pain in right lower leg: Secondary | ICD-10-CM

## 2024-06-19 DIAGNOSIS — R2689 Other abnormalities of gait and mobility: Secondary | ICD-10-CM

## 2024-06-19 DIAGNOSIS — S86111D Strain of other muscle(s) and tendon(s) of posterior muscle group at lower leg level, right leg, subsequent encounter: Secondary | ICD-10-CM | POA: Diagnosis not present

## 2024-06-19 NOTE — Therapy (Signed)
 " OUTPATIENT PHYSICAL THERAPY LOWER EXTREMITY PROGRESS NOTE   Patient Name: Terry Shannon MRN: [id number] DOB:[birth year], 69 y.o., female Today's Date: 06/19/2024  END OF SESSION:  PT End of Session - 06/19/24 1457     Visit Number 6    Date for Recertification  07/22/24    Authorization Type MCR    Progress Note Due on Visit 10    PT Start Time 1450    PT Stop Time 1530    PT Time Calculation (min) 40 min    Activity Tolerance Patient tolerated treatment well           Past Medical History:  Diagnosis Date   Allergy     year-round, pt. 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   right   Complication of anesthesia    slow to wake up after gallbladder surgery   Depression    Eczema    ARMS AND HANDS   GERD (gastroesophageal reflux disease)    History of thyroid  cancer    Hypothyroidism    Migraines    Osteoarthritis    Overactive bladder    RA (rheumatoid arthritis) (HCC)    Sleep apnea    no CPAP use   Squamous cell carcinoma of skin 12/21/2017   in situ-mid chest ([id number])   Ulcerative colitis (HCC)    Past Surgical History:  Procedure Laterality Date   ABDOMINAL HERNIA REPAIR  03/13/2008   periumbilical ventral hernia/incisional hernia   BUNIONECTOMY Right    x 2 more   BUNIONECTOMY Left    BUNIONECTOMY WITH CHILECTOMY Right 12/16/2005   CARPAL TUNNEL RELEASE Right 07/13/2001   CARPAL TUNNEL RELEASE Left 08/10/2001   DILATION AND CURETTAGE OF UTERUS     HAMMER TOE SURGERY Left may 2014   HYSTEROSCOPY WITH D & C  03/01/2004   with exc. of endometrial polyp   KNEE ARTHROSCOPY Right 2013   LAPAROSCOPIC CHOLECYSTECTOMY  11/03/2006   LUMBAR FUSION  03/06/2014   l4  l5    nerve ablation lumbar      THYROIDECTOMY, PARTIAL Right prior to 2002   THYROIDECTOMY, PARTIAL Left 11/29/2000   and isthmus   TONSILLECTOMY  1961   TOTAL ANKLE REPLACEMENT Left 11/2016   with tendon repair   TOTAL KNEE ARTHROPLASTY Right 04/01/2013    Procedure: RIGHT TOTAL KNEE ARTHROPLASTY;  Surgeon: [name], MD;  Location: [facility] OR;  Service: Orthopedics;  Laterality: Right;   Patient Active Problem List   Diagnosis Date Noted   Asthmatic bronchitis, mild persistent, uncomplicated 01/27/2022   Upper airway cough syndrome 01/27/2022   OSA on CPAP 04/27/2021   Intolerance of continuous positive airway pressure (CPAP) ventilation 06/17/2020   OSA (obstructive sleep apnea) 05/06/2020   Snoring 05/06/2020   Insomnia secondary to chronic pain 03/24/2020   History of sleep apnea 03/24/2020   Gasping for breath 03/24/2020   Abnormal glucose level 12/16/2019   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   Degeneration of lumbar intervertebral disc 12/16/2019   Hyperlipidemia 12/16/2019   Hypothyroidism 12/16/2019   Insomnia 12/16/2019   Migraine 12/16/2019   Overweight 12/16/2019   Postoperative hypothyroidism 12/16/2019   Vitamin D  deficiency 12/16/2019   Hallux rigidus, right foot 12/05/2019   Hammer toe of right foot 12/05/2019   History of left ankle joint replacement 12/05/2019   Aftercare following ankle joint replacement surgery 01/10/2017   Anxiety, mild 12/12/2016   Atypical chest pain 12/12/2016   Cardiac murmur 12/12/2016  GERD (gastroesophageal reflux disease) 12/12/2016   History of thyroid  cancer 12/12/2016   Post-traumatic osteoarthritis of left ankle 10/03/2016   Chronic migraine without aura, with intractable migraine, so stated, with status migrainosus 09/26/2015   Joint pain 02/18/2015   Abnormal C-reactive protein 01/08/2015   Radiculopathy 03/06/2014   Osteoarthritis of right knee 04/01/2013   OA (osteoarthritis) 11/01/2011   OAB (overactive bladder)    Ulcerative colitis (HCC)    Cancer (HCC)    Carpal tunnel syndrome    RA (rheumatoid arthritis) (HCC)     PCP: Theo Iha, MD   REFERRING PROVIDER: Irving Cookey MD  REFERRING DIAG: S86.111D (ICD-10-CM) - Strain of other muscle(s) and  tendon(s) of posterior muscle group at lower leg level, right leg, subsequent encounter    THERAPY DIAG:  Pain in right lower leg  Stiffness of right ankle, not elsewhere classified  Other abnormalities of gait and mobility  Rationale for Evaluation and Treatment: Rehabilitation  ONSET DATE: December 2025  SUBJECTIVE:   SUBJECTIVE STATEMENT: That clock ex is very difficult the last few days.  My pain level is less though.  Walking better and going up and down steps. No assistive device needed.   No changes since last visit. Using SPC 50/50 in house and when in community.  I had to take aspirin  after last time.    MRI scheduled 06/03/23. Using SPC.    PERTINENT HISTORY: R gastrocnemius tendon tear,  R TKA, vocal cord surgery, ankle replacement L, HOH, OA, RA, lumbar fusion PAIN: 06/19/24 Are you having pain? Yes: NPRS scale: knee and ankle a little sore/10 Pain location: R calf Pain description: unsure Aggravating factors: prolonged activity or pressure on it Relieving factors: aspirin , ice and elevation, compression stocking  PRECAUTIONS: None  RED FLAGS: None   WEIGHT BEARING RESTRICTIONS: no   FALLS:  Has patient fallen in last 6 months? No  LIVING ENVIRONMENT: Lives with: lives with their family Lives in: House/apartment Stairs: Yes: External: 6 steps; can reach both Has following equipment at home: Single point cane and Walker - 2 wheeled  OCCUPATION: retired  PLOF: Independent  PATIENT GOALS: get to where I can walk without a cane and drive again  NEXT MD VISIT: seeing Dr. Yvone 06/07/23 after MRI 1/11  OBJECTIVE:  Note: Objective measures were completed at Evaluation unless otherwise noted.  DIAGNOSTIC FINDINGS: gastroc tendon tear; popliteal cyst, negative DVT  PATIENT SURVEYS:  LEFS 26 / 80 = 32.5 %  COGNITION: Overall cognitive status: Within functional limits for tasks assessed     SENSATION: WFL  EDEMA:  Managed with compression  stocking   MUSCLE LENGTH: Tight R gastroc  POSTURE: rounded shoulders, forward head, and weight shift left  PALPATION: Palpation: TTP at R medial gastroc, achilles tendon but not at insertion. Increased tissue tension in medial gastroc  LOWER EXTREMITY ROM: WFL for tasks assessed  Active ROM Right eval Left eval  Hip flexion    Hip extension    Hip abduction    Hip adduction    Hip internal rotation    Hip external rotation    Knee flexion 125   Knee extension 0   Ankle dorsiflexion -4/3 with knee straight; 5/10 deg with knee bent   Ankle plantarflexion    Ankle inversion    Ankle eversion     (Blank rows = not tested)  LOWER EXTREMITY MMT:  MMT Right eval Left eval  Hip flexion 4+ 4  Hip extension    Hip abduction  Hip adduction    Hip internal rotation    Hip external rotation    Knee flexion    Knee extension 5   Ankle dorsiflexion 5   Ankle plantarflexion    Ankle inversion 5   Ankle eversion 5    (Blank rows = not tested)  FUNCTIONAL TESTS:  5 times sit to stand: 13.35 seconds no UE support shifts L Timed up and go (TUG): 12.29 sec   GAIT: Distance walked: 20 Assistive device utilized: Single point cane and None Level of assistance: Modified independence Comments: with cane and without decreased stance                                                                                                                                 TREATMENT DATE:   06/19/2024 Nu-Step L3 seat 7, arms 8 green machine 10 min- PT present to discuss progress including response to treatment  Red band ankle isometrics: red band 20 sec holds 2-3 reps  each (DF, PF, eversion) may give green next time  Inversion isometric with ball between feet 20 sec 5x Staggered stance sit to stands right leg back no hands from high table 10x  HS sets into the floor seated 20 sec 3x Isometric heel raise and holds 20 sec holds 5 reps; progression to heel raises with weight shifting side to  side 20 sec Single leg standing: WB on right with 3 side 3 ways Single leg standing with reach down with ball to stool 7x Discussed other Single leg standing options including UE reaches in multiple planes or rolling a ball under left foot while standing on right Pt given info on purple Pilates ball (pt liked it with inversion isometrics)    06/12/2024 Nu-Step L5 seat 7, arms 8 green machine 10 min- PT present to discuss progress including response to treatment  Red band ankle isometrics: red band 20 sec holds 3-5 reps (DF, PF, inv and eversion) Staggered stance sit to stands right leg back (left knee painful) no hands from high table 7x (Added to HEP- see below) Isometric heel raise and holds 20 sec holds 5 reps (Added to HEP- see below) Single leg standing: WB on right with 3 side 3 ways; UE reaches 2 sets of 8 each (Added to HEP- see below)    06/10/2024 Nu-Step L5 seat 7, arms 8 green machine 10 min- PT present to discuss progress Standing rockerboard x 3 minutes  Seated red band: plantarflexion 2x10 Seated red band: dorsiflexion band anchored under left foot 2x10 Seated red band: eversion 2x10 Seated red band: inversion 10x therapist holding 2x10 Seated red band: Plantarflexion with inversion 2x10 Seated gastroc stretch with strap 3x20 seconds  Weight shift on foam pad: 3 ways x 1 min each Stand on Rt and tap on 8 step with Lt 2x10 with intermittent UE support required for balance Seated Baps: level 3 x 1 min each direction  06/05/2024 Review of HEP Seated red band: plantarflexion 10x (Added to HEP- see below) Seated red band: dorsiflexion band anchored under left foot 10x (Added to HEP- see below) Seated red band: eversion 10x (Added to HEP- see below)  Seated red band: inversion 10x therapist holding (Added to HEP- see below) Seated red band: Plantarflexion with inversion 10x (Added to HEP- see below) Nu-Step L1 seat 7, arms 8 blue machine 10 min   05/29/2024 Review of  initial HEP: doing 3x/day (Discussed decreasing to 1 set 3x/day instead of 3 sets) Seated towel stretch (gentle) for gastroc 3 x30 sec  Seated HS stretch with heel on step stool 3x 30 sec  Towel scrunches 10x (Added to HEP- see below) Arch doming 10x(Added to HEP- see below) SLS on right 3x Standing WB on right with 8 inch stool taps 10x (Added to HEP- see below) Isometric heel raise and hold 10 sec 4x (Added to HEP- see below) Cold pack to medial calf with elevation for pain and edema control    PATIENT EDUCATION:  Education details: PT eval findings, anticipated POC, initial HEP, and discussion of use of compression stocking   Person educated: Patient Education method: Explanation, Demonstration, Tactile cues, Verbal cues, and Handouts Education comprehension: verbalized understanding and returned demonstration  HOME EXERCISE PROGRAM: Access Code: [email or web address] URL: [email or web address] Date: 06/12/2024 Prepared by: Glade Pesa  Exercises - Seated Ankle Circles  - 3 x daily - 7 x weekly - 1 sets - 10 reps - Seated Ankle Alphabet  - 3 x daily - 7 x weekly - 1 sets - 2 reps - Seated Heel Raise  - 3 x daily - 7 x weekly - 1-2 sets - 10 reps - Seated Toe Raise  - 3 x daily - 7 x weekly - 1-2 sets - 10 reps - Seated Ankle Inversion AROM  - 3 x daily - 7 x weekly - 1 sets - 10 reps - Seated Ankle Eversion AROM  - 1 x daily - 3 x weekly - 1 sets - 10 reps - Long Sitting Calf Stretch with Strap (Mirrored)  - 2 x daily - 7 x weekly - 1 sets - 3 reps - 30 sec hold - Seated Hamstring Stretch  - 1 x daily - 7 x weekly - 1 sets - 3 reps - 30 hold - Seated Toe Towel Scrunches  - 1 x daily - 7 x weekly - 1 sets - 10 reps - Seated Arch Lifts  - 1 x daily - 7 x weekly - 1 sets - 10 reps - Forward Step Touch (Mirrored)  - 1 x daily - 7 x weekly - 1 sets - 10 reps - Isometric Heel Raise at Wall  - 1 x daily - 7 x weekly - 1 sets - 3-5 reps - 10 hold - Seated Ankle Plantarflexion with  Resistance  - 7 x weekly - 1 sets - 10 reps - Seated Ankle Dorsiflexion with Resistance  - 1-3 x daily - 7 x weekly - 1 sets - 10 reps - Seated Ankle Eversion with Resistance  - 1-3 x daily - 7 x weekly - 1 sets - 10 reps - Seated Ankle Inversion with Resistance  - 1-3 x daily - 7 x weekly - 1 sets - 10 reps - Staggered Sit-to-Stand  - 2 x daily - 7 x weekly - 1 sets - 5 reps - Isometric Heel Raise at Wall  - 1 x daily - 7 x weekly - 1  sets - 3-5 reps - 20 hold - Single Leg Balance with Clock Reach  - 1 x daily - 7 x weekly - 1 sets - 8 reps ASSESSMENT:  CLINICAL IMPRESSION: 75-80% better since start of care.  She responds best to isometric style strengthening ex's vs high repetition ex secondary to ankle joint pathology (pt plans to have ankle joint replacement).  Reviewed technique and progressions of isometric challenge.  She is compliant with her HEP with some verbal cues for technique given.  Will check progress toward goals and she may be ready for discharge next visit.      EVAL: Patient is a 69 y.o. female who was seen today for physical therapy evaluation and treatment for R lower leg pain secondary to a suspected gastrocnemius tendon tear. She presents with a SPC and decreased stance time on the R LE. She demonstrates deficits in R ankle DF and strength, gastrocnemius flexibility and her gait. Her LEFS score indicates only 32% funcion. Pain and deficits affect her ability to walk without an AD, perform her normal chores and drive. She will benefit from skilled PT to address these deficits and those listed below.  She has an MRI scheduled on 06/02/24. .   OBJECTIVE IMPAIRMENTS: Abnormal gait, decreased activity tolerance, decreased balance, decreased knowledge of condition, difficulty walking, decreased ROM, decreased strength, increased edema, increased muscle spasms, impaired flexibility, postural dysfunction, and pain.   ACTIVITY LIMITATIONS: carrying, lifting, standing, squatting,  stairs, bathing, dressing, hygiene/grooming, and locomotion level  PARTICIPATION LIMITATIONS: meal prep, cleaning, laundry, driving, shopping, community activity, and yard work  PERSONAL FACTORS: Age and 3+ comorbidities:  R TKA,  ankle replacement L, HOH, OA, RA, lumbar fusion are also affecting patient's functional outcome.   REHAB POTENTIAL: Excellent  CLINICAL DECISION MAKING: Evolving/moderate complexity  EVALUATION COMPLEXITY: Low   GOALS: Goals reviewed with patient? Yes  SHORT TERM GOALS: Target date: 06/24/2024    Ind with initial HEP Baseline:  Goal status: met 1/28   2.  Able to safely ambulate without AD Baseline: no longer using cane (06/10/24) Goal status: MET  3.  Decreased R calf pain by 25% with ADLs and when resting leg on bed or ottoman. Baseline:  Goal status: met 1/28 75-80% better  LONG TERM GOALS: Target date: 07/22/2024   Ind with advanced HEP and its progression Baseline:  Goal status: INITIAL  2. Improved R ankle DF to 5 - 10  deg to normalize gait and functional mobility Baseline:  Goal status: INITIAL  3.  Able to perform R single leg heel raise showing improved plantar flexion strength. Baseline:  Goal status: INITIAL  4.  Able to climb stairs with a reciprocal gait pattern Baseline:  Goal status: INITIAL  5.  Patient able to drive without pain or difficulty. Baseline: driving now and no pain at the time, pain later (06/10/24) Goal status: In progress   6.  Pt able to safely amb 600 ft in 6 minutes to allow access to community. Baseline:  Goal status: INITIAL  7.  Improved LEFS to 50/80 or better showing functional improvement Baseline: 26/80 Goal status:INITIAL    PLAN:  PT FREQUENCY: 2x/week  PT DURATION: 8 weeks  PLANNED INTERVENTIONS: 97110-Therapeutic exercises, 97530- Therapeutic activity, 97112- Neuromuscular re-education, 97535- Self Care, [id number]- Manual therapy, [id number]- Gait training, [phone]- Aquatic Therapy, [phone]-  Electrical stimulation (unattended), [phone]- Electrical stimulation (manual), [id number]- Vasopneumatic device, [id number]- Ultrasound, [id number]- Ionotophoresis 4mg /ml Dexamethasone , 79439 (1-2 muscles), [id number] (3+ muscles)- Dry Needling, Patient/Family education, Balance training,  Stair training, Taping, Joint mobilization, DME instructions, Cryotherapy, and Moist heat  PLAN FOR NEXT SESSION: give green band for home (currently has red band), check progress toward goals for possible discharge; LEFS, check ankle ROM; ankle isometric style ex's with reduced number of repetitions secondary to pain response;  6 min walk test, Review and progress HEP, ROM, strengthening, gait, balance, manual/DN to medial gastroc/soleus, modalities prn   Glade Pesa, PT 06/19/24 3:43 PM Phone: [phone] Fax: [phone]     "

## 2024-06-24 ENCOUNTER — Telehealth: Payer: Self-pay

## 2024-06-24 NOTE — Telephone Encounter (Signed)
 Called 06-24-24 and lvmail to r/s apt due to weather delay

## 2024-06-25 ENCOUNTER — Ambulatory Visit (INDEPENDENT_AMBULATORY_CARE_PROVIDER_SITE_OTHER)

## 2024-06-25 VITALS — BP 153/93 | HR 66 | Wt 162.0 lb

## 2024-06-25 DIAGNOSIS — H903 Sensorineural hearing loss, bilateral: Secondary | ICD-10-CM

## 2024-06-25 DIAGNOSIS — J040 Acute laryngitis: Secondary | ICD-10-CM

## 2024-06-25 DIAGNOSIS — J309 Allergic rhinitis, unspecified: Secondary | ICD-10-CM

## 2024-06-25 DIAGNOSIS — J300 Vasomotor rhinitis: Secondary | ICD-10-CM

## 2024-06-25 DIAGNOSIS — R490 Dysphonia: Secondary | ICD-10-CM

## 2024-06-25 DIAGNOSIS — H6991 Unspecified Eustachian tube disorder, right ear: Secondary | ICD-10-CM

## 2024-06-25 DIAGNOSIS — K219 Gastro-esophageal reflux disease without esophagitis: Secondary | ICD-10-CM

## 2024-06-25 DIAGNOSIS — J00 Acute nasopharyngitis [common cold]: Secondary | ICD-10-CM

## 2024-06-25 DIAGNOSIS — R053 Chronic cough: Secondary | ICD-10-CM

## 2024-06-25 DIAGNOSIS — J383 Other diseases of vocal cords: Secondary | ICD-10-CM

## 2024-06-25 MED ORDER — AMOXICILLIN-POT CLAVULANATE 875-125 MG PO TABS: 1.0000 | ORAL_TABLET | Freq: Two times a day (BID) | ORAL | 0 refills | Status: AC

## 2024-06-25 MED ORDER — MUPIROCIN 2 % EX OINT: 1.0000 | TOPICAL_OINTMENT | Freq: Every day | CUTANEOUS | 2 refills | Status: AC

## 2024-06-25 NOTE — Patient Instructions (Signed)
" °  VISIT SUMMARY: During your visit, we discussed your persistent cough, nasal congestion, voice changes, and hearing difficulties. We reviewed your symptoms and made adjustments to your treatment plan to help manage these issues.  YOUR PLAN: -CHRONIC NASOPHARYNGITIS WITH POSTNASAL DRAINAGE AND CHRONIC COUGH: Chronic nasopharyngitis is a long-term inflammation of the nasal passages and throat, often leading to postnasal drainage and cough. We suspect a bacterial infection and have prescribed Augmentin . You should increase nasal saline irrigations to twice daily and continue using azelastine  nasal spray. We also prescribed intranasal mupirocin  ointment for crusting and possible local infection.  -LARYNGOPHARYNGEAL REFLUX: Laryngopharyngeal reflux is when stomach acid backs up into the throat, causing irritation and contributing to chronic cough. Continue taking Protonix  40 mg daily to manage this condition.  -DYSPHONIA DUE TO LARYNGEAL INFLAMMATION: Dysphonia is difficulty speaking due to problems with the vocal cords. Your raspy voice is likely due to laryngeal inflammation, which is worsened by chronic cough and postnasal drainage. We have started you on Augmentin  to address any possible infection and recommend continuing cough suppression techniques and managing postnasal drainage and reflux.  -RIGHT EUSTACHIAN TUBE DYSFUNCTION WITH TYMPANOSTOMY TUBE: Eustachian tube dysfunction occurs when the tube connecting the middle ear to the throat does not open properly, affecting hearing. Your tympanostomy tube is in good condition, and we will continue to monitor it.  -BILATERAL SENSORINEURAL HEARING LOSS: Sensorineural hearing loss is a type of hearing loss that occurs due to damage to the inner ear or the nerve pathways from the inner ear to the brain. We have ordered a repeat audiogram to reassess your hearing status.  INSTRUCTIONS: Please follow up with the repeat audiogram to reassess your hearing  status. Continue with the prescribed medications and nasal irrigation as discussed. If you have any new symptoms or concerns, please contact our office.    Contains text generated by [employer].    "

## 2024-06-26 ENCOUNTER — Other Ambulatory Visit (INDEPENDENT_AMBULATORY_CARE_PROVIDER_SITE_OTHER): Payer: Self-pay

## 2024-06-26 ENCOUNTER — Ambulatory Visit

## 2024-06-26 DIAGNOSIS — M25671 Stiffness of right ankle, not elsewhere classified: Secondary | ICD-10-CM

## 2024-06-26 DIAGNOSIS — R252 Cramp and spasm: Secondary | ICD-10-CM

## 2024-06-26 DIAGNOSIS — R2689 Other abnormalities of gait and mobility: Secondary | ICD-10-CM

## 2024-06-26 DIAGNOSIS — M79661 Pain in right lower leg: Secondary | ICD-10-CM

## 2024-06-26 DIAGNOSIS — J309 Allergic rhinitis, unspecified: Secondary | ICD-10-CM

## 2024-06-26 NOTE — Therapy (Signed)
 " OUTPATIENT PHYSICAL THERAPY LOWER EXTREMITY PROGRESS NOTE   Patient Name: Terry Shannon MRN: [id number] DOB:14-Apr-1956, 69 y.o., female Today's Date: 06/26/2024  END OF SESSION:  PT End of Session - 06/26/24 1157     Visit Number 7    Date for Recertification  07/22/24    Authorization Type MCR    PT Start Time 1145    PT Stop Time 1228    PT Time Calculation (min) 43 min    Activity Tolerance Patient tolerated treatment well    Behavior During Therapy WFL for tasks assessed/performed            Past Medical History:  Diagnosis Date   Allergy     year-round, pt. 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   right   Complication of anesthesia    slow to wake up after gallbladder surgery   Depression    Eczema    ARMS AND HANDS   GERD (gastroesophageal reflux disease)    History of thyroid  cancer    Hypothyroidism    Migraines    Osteoarthritis    Overactive bladder    RA (rheumatoid arthritis) (HCC)    Sleep apnea    no CPAP use   Squamous cell carcinoma of skin 12/21/2017   in situ-mid chest ([id number])   Ulcerative colitis (HCC)    Past Surgical History:  Procedure Laterality Date   ABDOMINAL HERNIA REPAIR  03/13/2008   periumbilical ventral hernia/incisional hernia   BUNIONECTOMY Right    x 2 more   BUNIONECTOMY Left    BUNIONECTOMY WITH CHILECTOMY Right 12/16/2005   CARPAL TUNNEL RELEASE Right 07/13/2001   CARPAL TUNNEL RELEASE Left 08/10/2001   DILATION AND CURETTAGE OF UTERUS     HAMMER TOE SURGERY Left may 2014   HYSTEROSCOPY WITH D & C  03/01/2004   with exc. of endometrial polyp   KNEE ARTHROSCOPY Right 2013   LAPAROSCOPIC CHOLECYSTECTOMY  11/03/2006   LUMBAR FUSION  03/06/2014   l4  l5    nerve ablation lumbar      THYROIDECTOMY, PARTIAL Right prior to 2002   THYROIDECTOMY, PARTIAL Left 11/29/2000   and isthmus   TONSILLECTOMY  1961   TOTAL ANKLE REPLACEMENT Left 11/2016   with tendon repair   TOTAL KNEE  ARTHROPLASTY Right 04/01/2013   Procedure: RIGHT TOTAL KNEE ARTHROPLASTY;  Surgeon: [name], MD;  Location: [facility] OR;  Service: Orthopedics;  Laterality: Right;   Patient Active Problem List   Diagnosis Date Noted   Asthmatic bronchitis, mild persistent, uncomplicated 01/27/2022   Upper airway cough syndrome 01/27/2022   OSA on CPAP 04/27/2021   Intolerance of continuous positive airway pressure (CPAP) ventilation 06/17/2020   OSA (obstructive sleep apnea) 05/06/2020   Snoring 05/06/2020   Insomnia secondary to chronic pain 03/24/2020   History of sleep apnea 03/24/2020   Gasping for breath 03/24/2020   Abnormal glucose level 12/16/2019   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   Degeneration of lumbar intervertebral disc 12/16/2019   Hyperlipidemia 12/16/2019   Hypothyroidism 12/16/2019   Insomnia 12/16/2019   Migraine 12/16/2019   Overweight 12/16/2019   Postoperative hypothyroidism 12/16/2019   Vitamin D  deficiency 12/16/2019   Hallux rigidus, right foot 12/05/2019   Hammer toe of right foot 12/05/2019   History of left ankle joint replacement 12/05/2019   Aftercare following ankle joint replacement surgery 01/10/2017   Anxiety, mild 12/12/2016   Atypical chest pain 12/12/2016   Cardiac murmur  12/12/2016   GERD (gastroesophageal reflux disease) 12/12/2016   History of thyroid  cancer 12/12/2016   Post-traumatic osteoarthritis of left ankle 10/03/2016   Chronic migraine without aura, with intractable migraine, so stated, with status migrainosus 09/26/2015   Joint pain 02/18/2015   Abnormal C-reactive protein 01/08/2015   Radiculopathy 03/06/2014   Osteoarthritis of right knee 04/01/2013   OA (osteoarthritis) 11/01/2011   OAB (overactive bladder)    Ulcerative colitis (HCC)    Cancer (HCC)    Carpal tunnel syndrome    RA (rheumatoid arthritis) (HCC)     PCP: Theo Iha, MD   REFERRING PROVIDER: Irving Cookey MD  REFERRING DIAG: S86.111D (ICD-10-CM) -  Strain of other muscle(s) and tendon(s) of posterior muscle group at lower leg level, right leg, subsequent encounter    THERAPY DIAG:  Pain in right lower leg  Stiffness of right ankle, not elsewhere classified  Other abnormalities of gait and mobility  Cramp and spasm  Rationale for Evaluation and Treatment: Rehabilitation  ONSET DATE: December 2025  SUBJECTIVE:   SUBJECTIVE STATEMENT: I am ready to D/C to HEP   No changes since last visit. Using SPC 50/50 in house and when in community.  I had to take aspirin  after last time.    MRI scheduled 06/03/23. Using SPC.    PERTINENT HISTORY: R gastrocnemius tendon tear,  R TKA, vocal cord surgery, ankle replacement L, HOH, OA, RA, lumbar fusion PAIN: 06/26/24 Are you having pain? Yes: NPRS scale: knee and ankle a little sore 0/10 Pain location: R calf Pain description: unsure Aggravating factors: prolonged activity or pressure on it Relieving factors: aspirin , ice and elevation, compression stocking  PRECAUTIONS: None  RED FLAGS: None   WEIGHT BEARING RESTRICTIONS: no   FALLS:  Has patient fallen in last 6 months? No  LIVING ENVIRONMENT: Lives with: lives with their family Lives in: House/apartment Stairs: Yes: External: 6 steps; can reach both Has following equipment at home: Single point cane and Walker - 2 wheeled  OCCUPATION: retired  PLOF: Independent  PATIENT GOALS: get to where I can walk without a cane and drive again  NEXT MD VISIT: seeing Dr. Yvone 06/07/23 after MRI 1/11  OBJECTIVE:  Note: Objective measures were completed at Evaluation unless otherwise noted.  DIAGNOSTIC FINDINGS: gastroc tendon tear; popliteal cyst, negative DVT  PATIENT SURVEYS:  LEFS 26 / 80 = 32.5 % 06/26/24: LEFS: 53/80=66%  COGNITION: Overall cognitive status: Within functional limits for tasks assessed     SENSATION: WFL  EDEMA:  Managed with compression stocking   MUSCLE LENGTH: Tight R gastroc  POSTURE:  rounded shoulders, forward head, and weight shift left  PALPATION: Palpation: TTP at R medial gastroc, achilles tendon but not at insertion. Increased tissue tension in medial gastroc  LOWER EXTREMITY ROM: WFL for tasks assessed  Active ROM Right eval Rt  06/26/24 Left eval  Hip flexion     Hip extension     Hip abduction     Hip adduction     Hip internal rotation     Hip external rotation     Knee flexion 125    Knee extension 0    Ankle dorsiflexion -4/3 with knee straight; 5/10 deg with knee bent 5 degrees with knee straight    Ankle plantarflexion     Ankle inversion     Ankle eversion      (Blank rows = not tested)  LOWER EXTREMITY MMT:  MMT Right eval Left eval  Hip flexion 4+ 4  Hip extension    Hip abduction    Hip adduction    Hip internal rotation    Hip external rotation    Knee flexion    Knee extension 5   Ankle dorsiflexion 5   Ankle plantarflexion    Ankle inversion 5   Ankle eversion 5    (Blank rows = not tested)  FUNCTIONAL TESTS:  5 times sit to stand: 13.35 seconds no UE support shifts L Timed up and go (TUG): 12.29 sec   6 min walk test:  GAIT: Distance walked: 20 Assistive device utilized: Single point cane and None Level of assistance: Modified independence Comments: with cane and without decreased stance                                                                                                                                 TREATMENT DATE:   06/26/2024 Nu-Step L5 seat 7, arms 8 green machine 10 min- PT present to discuss progress including response to treatment  Green band ankle isometrics: red band 20 sec holds 2-3 reps  each (DF, PF, eversion) issued for home  Inversion isometric with ball between feet 20 sec 5x Staggered stance sit to stands right leg back no hands from high table 10x  HS sets into the floor seated 20 sec 3x Isometric heel raise and holds 20 sec holds 5 reps; progression to heel raises with weight shifting  side to side 20 sec 6 min walk test: 1177 feet  Verbal review of HEP with visual provided    06/19/2024 Nu-Step L3 seat 7, arms 8 green machine 10 min- PT present to discuss progress including response to treatment  Red band ankle isometrics: red band 20 sec holds 2-3 reps  each (DF, PF, eversion) may give green next time  Inversion isometric with ball between feet 20 sec 5x Staggered stance sit to stands right leg back no hands from high table 10x  HS sets into the floor seated 20 sec 3x Isometric heel raise and holds 20 sec holds 5 reps; progression to heel raises with weight shifting side to side 20 sec Single leg standing: WB on right with 3 side 3 ways Single leg standing with reach down with ball to stool 7x Discussed other Single leg standing options including UE reaches in multiple planes or rolling a ball under left foot while standing on right Pt given info on purple Pilates ball (pt liked it with inversion isometrics)    06/12/2024 Nu-Step L5 seat 7, arms 8 green machine 10 min- PT present to discuss progress including response to treatment  Red band ankle isometrics: red band 20 sec holds 3-5 reps (DF, PF, inv and eversion) Staggered stance sit to stands right leg back (left knee painful) no hands from high table 7x (Added to HEP- see below) Isometric heel raise and holds 20 sec holds 5 reps (Added to HEP- see below) Single leg standing: WB  on right with 3 side 3 ways; UE reaches 2 sets of 8 each (Added to HEP- see below)     PATIENT EDUCATION:  Education details: PT eval findings, anticipated POC, initial HEP, and discussion of use of compression stocking   Person educated: Patient Education method: Explanation, Demonstration, Tactile cues, Verbal cues, and Handouts Education comprehension: verbalized understanding and returned demonstration  HOME EXERCISE PROGRAM: Access Code: [email or web address] URL: [email or web address] Date: 06/12/2024 Prepared by: Glade Pesa  Exercises - Seated Ankle Circles  - 3 x daily - 7 x weekly - 1 sets - 10 reps - Seated Ankle Alphabet  - 3 x daily - 7 x weekly - 1 sets - 2 reps - Seated Heel Raise  - 3 x daily - 7 x weekly - 1-2 sets - 10 reps - Seated Toe Raise  - 3 x daily - 7 x weekly - 1-2 sets - 10 reps - Seated Ankle Inversion AROM  - 3 x daily - 7 x weekly - 1 sets - 10 reps - Seated Ankle Eversion AROM  - 1 x daily - 3 x weekly - 1 sets - 10 reps - Long Sitting Calf Stretch with Strap (Mirrored)  - 2 x daily - 7 x weekly - 1 sets - 3 reps - 30 sec hold - Seated Hamstring Stretch  - 1 x daily - 7 x weekly - 1 sets - 3 reps - 30 hold - Seated Toe Towel Scrunches  - 1 x daily - 7 x weekly - 1 sets - 10 reps - Seated Arch Lifts  - 1 x daily - 7 x weekly - 1 sets - 10 reps - Forward Step Touch (Mirrored)  - 1 x daily - 7 x weekly - 1 sets - 10 reps - Isometric Heel Raise at Wall  - 1 x daily - 7 x weekly - 1 sets - 3-5 reps - 10 hold - Seated Ankle Plantarflexion with Resistance  - 7 x weekly - 1 sets - 10 reps - Seated Ankle Dorsiflexion with Resistance  - 1-3 x daily - 7 x weekly - 1 sets - 10 reps - Seated Ankle Eversion with Resistance  - 1-3 x daily - 7 x weekly - 1 sets - 10 reps - Seated Ankle Inversion with Resistance  - 1-3 x daily - 7 x weekly - 1 sets - 10 reps - Staggered Sit-to-Stand  - 2 x daily - 7 x weekly - 1 sets - 5 reps - Isometric Heel Raise at Wall  - 1 x daily - 7 x weekly - 1 sets - 3-5 reps - 20 hold - Single Leg Balance with Clock Reach  - 1 x daily - 7 x weekly - 1 sets - 8 reps ASSESSMENT:  CLINICAL IMPRESSION: 75-80% better since start of care.  She responds best to isometric style strengthening ex's vs high repetition ex secondary to ankle joint pathology (pt plans to have ankle joint replacement). She continues to negotiate steps with step-to gait.  This type of exercise was emphasized today and she will continue with this after D/C.  Reviewed technique and progressions of  isometric challenge.  She is compliant with her HEP with some verbal cues for technique given.    EVAL: Patient is a 69 y.o. female who was seen today for physical therapy evaluation and treatment for R lower leg pain secondary to a suspected gastrocnemius tendon tear. She presents with a SPC and decreased stance time on the R  LE. She demonstrates deficits in R ankle DF and strength, gastrocnemius flexibility and her gait. Her LEFS score indicates only 32% funcion. Pain and deficits affect her ability to walk without an AD, perform her normal chores and drive. She will benefit from skilled PT to address these deficits and those listed below.  She has an MRI scheduled on 06/02/24. .   OBJECTIVE IMPAIRMENTS: Abnormal gait, decreased activity tolerance, decreased balance, decreased knowledge of condition, difficulty walking, decreased ROM, decreased strength, increased edema, increased muscle spasms, impaired flexibility, postural dysfunction, and pain.   ACTIVITY LIMITATIONS: carrying, lifting, standing, squatting, stairs, bathing, dressing, hygiene/grooming, and locomotion level  PARTICIPATION LIMITATIONS: meal prep, cleaning, laundry, driving, shopping, community activity, and yard work  PERSONAL FACTORS: Age and 3+ comorbidities:  R TKA,  ankle replacement L, HOH, OA, RA, lumbar fusion are also affecting patient's functional outcome.   REHAB POTENTIAL: Excellent  CLINICAL DECISION MAKING: Evolving/moderate complexity  EVALUATION COMPLEXITY: Low   GOALS: Goals reviewed with patient? Yes  SHORT TERM GOALS: Target date: 06/24/2024    Ind with initial HEP Baseline:  Goal status: met 1/28   2.  Able to safely ambulate without AD Baseline: no longer using cane (06/10/24) Goal status: MET  3.  Decreased R calf pain by 25% with ADLs and when resting leg on bed or ottoman. Baseline:  Goal status: met 1/28 75-80% better  LONG TERM GOALS: Target date: 07/22/2024   Ind with advanced HEP and its  progression Baseline: independent and compliant with current HEP Goal status: MET  2. Improved R ankle DF to 5 - 10  deg to normalize gait and functional mobility Baseline: 5 degrees (06/26/24) Goal status: INITIAL  3.  Able to perform R single leg heel raise showing improved plantar flexion strength. Baseline: partial ROM on Rt (06/26/24) Goal status: partially met   4.  Able to climb stairs with a reciprocal gait pattern Baseline: not doing this due to ankle pain (06/26/24) Goal status:Not met   5.  Patient able to drive without pain or difficulty. Baseline: no limitation  Goal status:  MET  6.  Pt able to safely amb 600 ft in 6 minutes to allow access to community. Baseline: 1177 feet (06/26/24) Goal status: MET  7.  Improved LEFS to 50/80 or better showing functional improvement Baseline: 53/80=66%  Goal status:MET    PLAN:  PHYSICAL THERAPY DISCHARGE SUMMARY  Visits from Start of Care: 7  Current functional level related to goals / functional outcomes: See above for current status.    Remaining deficits: Rt ankle pain. Pt plans to pursue assessment for total joint replacement.  She will continue with HEP.    Education / Equipment: HEP   Patient agrees to discharge. Patient goals were partially met. Patient is being discharged due to being pleased with the current functional level.   Burnard Joy, PT 06/26/24 12:33 PM  Phone: [phone] Fax: [phone]     "

## 2024-07-31 ENCOUNTER — Ambulatory Visit (INDEPENDENT_AMBULATORY_CARE_PROVIDER_SITE_OTHER): Admitting: Audiology

## 2024-07-31 ENCOUNTER — Ambulatory Visit (INDEPENDENT_AMBULATORY_CARE_PROVIDER_SITE_OTHER)

## 2024-11-18 ENCOUNTER — Ambulatory Visit: Admitting: Adult Health
